# Patient Record
Sex: Female | Born: 1960
Health system: Southern US, Community
[De-identification: ages and names within clinical notes are randomized; demographics above are authoritative.]

## PROBLEM LIST (undated history)

## (undated) DIAGNOSIS — Z9889 Other specified postprocedural states: Secondary | ICD-10-CM

## (undated) DIAGNOSIS — M779 Enthesopathy, unspecified: Secondary | ICD-10-CM

## (undated) DIAGNOSIS — I6523 Occlusion and stenosis of bilateral carotid arteries: Secondary | ICD-10-CM

## (undated) DIAGNOSIS — D649 Anemia, unspecified: Secondary | ICD-10-CM

## (undated) DIAGNOSIS — R634 Abnormal weight loss: Secondary | ICD-10-CM

## (undated) DIAGNOSIS — Z9221 Personal history of antineoplastic chemotherapy: Secondary | ICD-10-CM

## (undated) DIAGNOSIS — K625 Hemorrhage of anus and rectum: Secondary | ICD-10-CM

## (undated) DIAGNOSIS — I700 Atherosclerosis of aorta: Secondary | ICD-10-CM

## (undated) DIAGNOSIS — I429 Cardiomyopathy, unspecified: Secondary | ICD-10-CM

## (undated) DIAGNOSIS — I2510 Atherosclerotic heart disease of native coronary artery without angina pectoris: Secondary | ICD-10-CM

## (undated) DIAGNOSIS — Z923 Personal history of irradiation: Secondary | ICD-10-CM

## (undated) DIAGNOSIS — J189 Pneumonia, unspecified organism: Secondary | ICD-10-CM

## (undated) DIAGNOSIS — N183 Chronic kidney disease, stage 3 unspecified: Secondary | ICD-10-CM

## (undated) DIAGNOSIS — E785 Hyperlipidemia, unspecified: Secondary | ICD-10-CM

## (undated) DIAGNOSIS — E230 Hypopituitarism: Secondary | ICD-10-CM

## (undated) DIAGNOSIS — I10 Essential (primary) hypertension: Secondary | ICD-10-CM

## (undated) DIAGNOSIS — R0609 Other forms of dyspnea: Secondary | ICD-10-CM

## (undated) DIAGNOSIS — N2581 Secondary hyperparathyroidism of renal origin: Secondary | ICD-10-CM

## (undated) DIAGNOSIS — J0430 Supraglottitis, unspecified, without obstruction: Secondary | ICD-10-CM

## (undated) DIAGNOSIS — R251 Tremor, unspecified: Secondary | ICD-10-CM

## (undated) DIAGNOSIS — F319 Bipolar disorder, unspecified: Secondary | ICD-10-CM

## (undated) DIAGNOSIS — G629 Polyneuropathy, unspecified: Secondary | ICD-10-CM

## (undated) DIAGNOSIS — S0096XA Insect bite (nonvenomous) of unspecified part of head, initial encounter: Secondary | ICD-10-CM

## (undated) DIAGNOSIS — G4733 Obstructive sleep apnea (adult) (pediatric): Secondary | ICD-10-CM

## (undated) DIAGNOSIS — E039 Hypothyroidism, unspecified: Secondary | ICD-10-CM

## (undated) DIAGNOSIS — C4370 Malignant melanoma of unspecified lower limb, including hip: Secondary | ICD-10-CM

## (undated) DIAGNOSIS — M810 Age-related osteoporosis without current pathological fracture: Secondary | ICD-10-CM

## (undated) DIAGNOSIS — I509 Heart failure, unspecified: Secondary | ICD-10-CM

## (undated) DIAGNOSIS — R000 Tachycardia, unspecified: Secondary | ICD-10-CM

## (undated) DIAGNOSIS — N3281 Overactive bladder: Secondary | ICD-10-CM

## (undated) DIAGNOSIS — K859 Acute pancreatitis without necrosis or infection, unspecified: Secondary | ICD-10-CM

## (undated) DIAGNOSIS — F419 Anxiety disorder, unspecified: Secondary | ICD-10-CM

## (undated) DIAGNOSIS — K5790 Diverticulosis of intestine, part unspecified, without perforation or abscess without bleeding: Secondary | ICD-10-CM

## (undated) DIAGNOSIS — E2740 Unspecified adrenocortical insufficiency: Secondary | ICD-10-CM

## (undated) DIAGNOSIS — M2550 Pain in unspecified joint: Secondary | ICD-10-CM

## (undated) DIAGNOSIS — N289 Disorder of kidney and ureter, unspecified: Secondary | ICD-10-CM

## (undated) DIAGNOSIS — W57XXXA Bitten or stung by nonvenomous insect and other nonvenomous arthropods, initial encounter: Secondary | ICD-10-CM

## (undated) DIAGNOSIS — M5126 Other intervertebral disc displacement, lumbar region: Secondary | ICD-10-CM

## (undated) DIAGNOSIS — R002 Palpitations: Secondary | ICD-10-CM

## (undated) DIAGNOSIS — Z85820 Personal history of malignant melanoma of skin: Secondary | ICD-10-CM

## (undated) DIAGNOSIS — F32A Depression, unspecified: Secondary | ICD-10-CM

## (undated) DIAGNOSIS — M1990 Unspecified osteoarthritis, unspecified site: Secondary | ICD-10-CM

## (undated) DIAGNOSIS — F329 Major depressive disorder, single episode, unspecified: Secondary | ICD-10-CM

## (undated) DIAGNOSIS — G319 Degenerative disease of nervous system, unspecified: Secondary | ICD-10-CM

## (undated) DIAGNOSIS — K219 Gastro-esophageal reflux disease without esophagitis: Secondary | ICD-10-CM

## (undated) HISTORY — DX: Bitten or stung by nonvenomous insect and other nonvenomous arthropods, initial encounter: W57.XXXA

## (undated) HISTORY — DX: Insect bite (nonvenomous) of unspecified part of head, initial encounter: S00.96XA

## (undated) HISTORY — DX: Gastro-esophageal reflux disease without esophagitis: K21.9

## (undated) HISTORY — DX: Malignant melanoma of unspecified lower limb, including hip: C43.70

## (undated) HISTORY — PX: SPINE SURGERY: SHX786

## (undated) HISTORY — DX: Other intervertebral disc displacement, lumbar region: M51.26

## (undated) HISTORY — PX: FRACTURE SURGERY: SHX138

## (undated) HISTORY — PX: ESOPHAGOGASTRODUODENOSCOPY: SHX1529

## (undated) HISTORY — DX: Personal history of malignant melanoma of skin: Z85.820

---

## 1983-03-01 HISTORY — PX: BREAST CYST EXCISION: SHX579

## 2006-02-28 DIAGNOSIS — Z9221 Personal history of antineoplastic chemotherapy: Secondary | ICD-10-CM

## 2006-02-28 HISTORY — DX: Personal history of antineoplastic chemotherapy: Z92.21

## 2006-02-28 HISTORY — PX: MELANOMA EXCISION: SHX5266

## 2006-12-30 ENCOUNTER — Ambulatory Visit: Payer: Self-pay | Admitting: Oncology

## 2007-01-18 ENCOUNTER — Ambulatory Visit: Payer: Self-pay | Admitting: Oncology

## 2007-01-23 ENCOUNTER — Ambulatory Visit: Payer: Self-pay | Admitting: Oncology

## 2007-01-29 ENCOUNTER — Ambulatory Visit: Payer: Self-pay | Admitting: Oncology

## 2007-02-09 ENCOUNTER — Ambulatory Visit: Payer: Self-pay | Admitting: General Surgery

## 2007-03-01 ENCOUNTER — Ambulatory Visit: Payer: Self-pay | Admitting: Oncology

## 2007-03-16 ENCOUNTER — Ambulatory Visit: Payer: Self-pay | Admitting: Oncology

## 2007-04-01 ENCOUNTER — Ambulatory Visit: Payer: Self-pay | Admitting: Oncology

## 2007-04-29 ENCOUNTER — Ambulatory Visit: Payer: Self-pay | Admitting: Oncology

## 2007-05-30 ENCOUNTER — Ambulatory Visit: Payer: Self-pay | Admitting: Oncology

## 2007-06-29 ENCOUNTER — Ambulatory Visit: Payer: Self-pay | Admitting: Oncology

## 2007-07-30 ENCOUNTER — Ambulatory Visit: Payer: Self-pay | Admitting: Oncology

## 2007-08-29 ENCOUNTER — Ambulatory Visit: Payer: Self-pay | Admitting: Oncology

## 2007-09-19 ENCOUNTER — Ambulatory Visit: Payer: Self-pay | Admitting: Oncology

## 2007-09-29 ENCOUNTER — Ambulatory Visit: Payer: Self-pay | Admitting: Oncology

## 2007-10-30 ENCOUNTER — Ambulatory Visit: Payer: Self-pay | Admitting: Oncology

## 2007-10-31 ENCOUNTER — Ambulatory Visit: Payer: Self-pay | Admitting: Oncology

## 2007-11-29 ENCOUNTER — Ambulatory Visit: Payer: Self-pay | Admitting: Oncology

## 2007-12-30 ENCOUNTER — Ambulatory Visit: Payer: Self-pay | Admitting: Oncology

## 2008-01-29 ENCOUNTER — Ambulatory Visit: Payer: Self-pay | Admitting: Oncology

## 2008-02-29 ENCOUNTER — Ambulatory Visit: Payer: Self-pay | Admitting: Oncology

## 2008-02-29 DIAGNOSIS — M5126 Other intervertebral disc displacement, lumbar region: Secondary | ICD-10-CM

## 2008-02-29 HISTORY — PX: BACK SURGERY: SHX140

## 2008-02-29 HISTORY — DX: Other intervertebral disc displacement, lumbar region: M51.26

## 2008-03-17 ENCOUNTER — Ambulatory Visit: Payer: Self-pay | Admitting: Oncology

## 2008-03-31 ENCOUNTER — Ambulatory Visit: Payer: Self-pay | Admitting: Oncology

## 2008-04-28 ENCOUNTER — Ambulatory Visit: Payer: Self-pay | Admitting: Oncology

## 2008-05-29 ENCOUNTER — Ambulatory Visit: Payer: Self-pay | Admitting: Oncology

## 2008-07-29 ENCOUNTER — Ambulatory Visit: Payer: Self-pay | Admitting: Oncology

## 2008-08-06 ENCOUNTER — Ambulatory Visit: Payer: Self-pay | Admitting: Oncology

## 2008-08-28 ENCOUNTER — Ambulatory Visit: Payer: Self-pay | Admitting: Oncology

## 2008-09-04 ENCOUNTER — Encounter: Payer: Self-pay | Admitting: Oncology

## 2008-09-28 ENCOUNTER — Encounter: Payer: Self-pay | Admitting: Oncology

## 2008-09-28 ENCOUNTER — Ambulatory Visit: Payer: Self-pay | Admitting: Oncology

## 2008-10-09 ENCOUNTER — Ambulatory Visit: Payer: Self-pay | Admitting: Oncology

## 2008-10-29 ENCOUNTER — Ambulatory Visit: Payer: Self-pay | Admitting: Oncology

## 2008-11-25 ENCOUNTER — Ambulatory Visit: Payer: Self-pay | Admitting: Pain Medicine

## 2008-11-28 ENCOUNTER — Ambulatory Visit: Payer: Self-pay | Admitting: Oncology

## 2008-12-15 ENCOUNTER — Ambulatory Visit: Payer: Self-pay | Admitting: Oncology

## 2008-12-18 ENCOUNTER — Ambulatory Visit: Payer: Self-pay | Admitting: Oncology

## 2008-12-19 ENCOUNTER — Ambulatory Visit: Payer: Self-pay | Admitting: Unknown Physician Specialty

## 2008-12-26 ENCOUNTER — Ambulatory Visit: Payer: Self-pay | Admitting: Unknown Physician Specialty

## 2008-12-26 HISTORY — PX: OTHER SURGICAL HISTORY: SHX169

## 2008-12-29 ENCOUNTER — Ambulatory Visit: Payer: Self-pay | Admitting: Oncology

## 2009-03-31 ENCOUNTER — Ambulatory Visit: Payer: Self-pay | Admitting: Oncology

## 2009-04-20 ENCOUNTER — Ambulatory Visit: Payer: Self-pay | Admitting: Oncology

## 2009-04-28 ENCOUNTER — Ambulatory Visit: Payer: Self-pay | Admitting: Oncology

## 2009-05-29 ENCOUNTER — Ambulatory Visit: Payer: Self-pay | Admitting: Oncology

## 2009-07-29 ENCOUNTER — Ambulatory Visit: Payer: Self-pay | Admitting: Oncology

## 2009-08-19 ENCOUNTER — Ambulatory Visit: Payer: Self-pay | Admitting: Oncology

## 2009-08-28 ENCOUNTER — Ambulatory Visit: Payer: Self-pay | Admitting: Oncology

## 2009-09-22 ENCOUNTER — Ambulatory Visit: Payer: Self-pay | Admitting: Oncology

## 2009-10-12 DIAGNOSIS — R350 Frequency of micturition: Secondary | ICD-10-CM | POA: Insufficient documentation

## 2009-10-12 DIAGNOSIS — Z720 Tobacco use: Secondary | ICD-10-CM | POA: Insufficient documentation

## 2009-10-12 DIAGNOSIS — Z85820 Personal history of malignant melanoma of skin: Secondary | ICD-10-CM | POA: Insufficient documentation

## 2009-12-24 ENCOUNTER — Ambulatory Visit: Payer: Self-pay | Admitting: Oncology

## 2009-12-29 ENCOUNTER — Ambulatory Visit: Payer: Self-pay | Admitting: Oncology

## 2010-02-28 DIAGNOSIS — Z85820 Personal history of malignant melanoma of skin: Secondary | ICD-10-CM

## 2010-02-28 DIAGNOSIS — K219 Gastro-esophageal reflux disease without esophagitis: Secondary | ICD-10-CM

## 2010-02-28 HISTORY — DX: Gastro-esophageal reflux disease without esophagitis: K21.9

## 2010-02-28 HISTORY — DX: Personal history of malignant melanoma of skin: Z85.820

## 2010-06-24 ENCOUNTER — Ambulatory Visit: Payer: Self-pay | Admitting: Oncology

## 2010-06-29 ENCOUNTER — Ambulatory Visit: Payer: Self-pay | Admitting: Oncology

## 2010-09-15 ENCOUNTER — Ambulatory Visit: Payer: Self-pay | Admitting: Nurse Practitioner

## 2010-09-17 ENCOUNTER — Ambulatory Visit: Payer: Self-pay | Admitting: Oncology

## 2010-09-29 ENCOUNTER — Ambulatory Visit: Payer: Self-pay | Admitting: Oncology

## 2010-10-15 ENCOUNTER — Ambulatory Visit: Payer: Self-pay | Admitting: General Surgery

## 2010-10-19 ENCOUNTER — Ambulatory Visit: Payer: Self-pay | Admitting: General Surgery

## 2010-10-30 ENCOUNTER — Ambulatory Visit: Payer: Self-pay | Admitting: Oncology

## 2010-11-16 LAB — PATHOLOGY REPORT

## 2010-11-29 ENCOUNTER — Ambulatory Visit: Payer: Self-pay | Admitting: Oncology

## 2010-12-19 HISTORY — PX: SKIN LESION EXCISION: SHX2412

## 2010-12-30 ENCOUNTER — Ambulatory Visit: Payer: Self-pay | Admitting: Oncology

## 2011-01-29 ENCOUNTER — Ambulatory Visit: Payer: Self-pay | Admitting: Oncology

## 2011-03-01 ENCOUNTER — Ambulatory Visit: Payer: Self-pay | Admitting: Oncology

## 2011-03-02 LAB — COMPREHENSIVE METABOLIC PANEL
Albumin: 3.4 g/dL (ref 3.4–5.0)
Alkaline Phosphatase: 75 U/L (ref 50–136)
Anion Gap: 8 (ref 7–16)
BUN: 11 mg/dL (ref 7–18)
Bilirubin,Total: 0.1 mg/dL — ABNORMAL LOW (ref 0.2–1.0)
Co2: 27 mmol/L (ref 21–32)
Creatinine: 1.34 mg/dL — ABNORMAL HIGH (ref 0.60–1.30)
EGFR (Non-African Amer.): 45 — ABNORMAL LOW
Glucose: 110 mg/dL — ABNORMAL HIGH (ref 65–99)
Osmolality: 281 (ref 275–301)
Potassium: 3.4 mmol/L — ABNORMAL LOW (ref 3.5–5.1)
SGPT (ALT): 23 U/L
Total Protein: 6.7 g/dL (ref 6.4–8.2)

## 2011-03-02 LAB — CBC CANCER CENTER
Basophil #: 0 x10 3/mm (ref 0.0–0.1)
Basophil %: 0.3 %
Eosinophil %: 5 %
HCT: 35.1 % (ref 35.0–47.0)
Lymphocyte %: 22.1 %
MCH: 32.2 pg (ref 26.0–34.0)
MCHC: 33.5 g/dL (ref 32.0–36.0)
MCV: 96 fL (ref 80–100)
Monocyte %: 1.6 %
RBC: 3.65 10*6/uL — ABNORMAL LOW (ref 3.80–5.20)
RDW: 14.2 % (ref 11.5–14.5)

## 2011-03-02 LAB — LIPASE, BLOOD: Lipase: 355 U/L (ref 73–393)

## 2011-03-02 LAB — AMYLASE: Amylase: 44 U/L (ref 25–115)

## 2011-03-10 DIAGNOSIS — E039 Hypothyroidism, unspecified: Secondary | ICD-10-CM | POA: Insufficient documentation

## 2011-03-10 DIAGNOSIS — E2740 Unspecified adrenocortical insufficiency: Secondary | ICD-10-CM | POA: Insufficient documentation

## 2011-03-10 LAB — HCG, QUANTITATIVE, PREGNANCY: Beta Hcg, Quant.: <1 m[IU]/mL — ABNORMAL LOW

## 2011-03-11 ENCOUNTER — Ambulatory Visit: Payer: Self-pay | Admitting: Oncology

## 2011-03-14 LAB — CBC CANCER CENTER
Basophil #: 0 x10 3/mm (ref 0.0–0.1)
Eosinophil #: 0.2 x10 3/mm (ref 0.0–0.7)
HGB: 13.9 g/dL (ref 12.0–16.0)
Lymphocyte #: 2.1 x10 3/mm (ref 1.0–3.6)
MCH: 33.1 pg (ref 26.0–34.0)
MCHC: 34.1 g/dL (ref 32.0–36.0)
MCV: 97 fL (ref 80–100)
Monocyte #: 0.3 x10 3/mm (ref 0.0–0.7)
Neutrophil %: 72.9 %
Platelet: 326 x10 3/mm (ref 150–440)
RDW: 14.8 % — ABNORMAL HIGH (ref 11.5–14.5)

## 2011-03-14 LAB — COMPREHENSIVE METABOLIC PANEL
Albumin: 4.3 g/dL (ref 3.4–5.0)
Alkaline Phosphatase: 80 U/L (ref 50–136)
Bilirubin,Total: 0.3 mg/dL (ref 0.2–1.0)
Calcium, Total: 9.3 mg/dL (ref 8.5–10.1)
Co2: 31 mmol/L (ref 21–32)
Creatinine: 1.38 mg/dL — ABNORMAL HIGH (ref 0.60–1.30)
EGFR (Non-African Amer.): 43 — ABNORMAL LOW
Glucose: 118 mg/dL — ABNORMAL HIGH (ref 65–99)
SGOT(AST): 26 U/L (ref 15–37)
Sodium: 139 mmol/L (ref 136–145)

## 2011-04-01 ENCOUNTER — Ambulatory Visit: Payer: Self-pay | Admitting: Oncology

## 2011-06-13 ENCOUNTER — Ambulatory Visit: Payer: Self-pay | Admitting: Oncology

## 2011-06-13 LAB — CBC CANCER CENTER
Eosinophil #: 0.1 x10 3/mm (ref 0.0–0.7)
HCT: 38.4 % (ref 35.0–47.0)
Lymphocyte #: 1.9 x10 3/mm (ref 1.0–3.6)
Lymphocyte %: 16.9 %
MCH: 31.8 pg (ref 26.0–34.0)
MCHC: 34.1 g/dL (ref 32.0–36.0)
Monocyte #: 0.4 x10 3/mm (ref 0.2–0.9)
Neutrophil %: 78.2 %
Platelet: 218 x10 3/mm (ref 150–440)
RBC: 4.12 10*6/uL (ref 3.80–5.20)
RDW: 11.9 % (ref 11.5–14.5)

## 2011-06-13 LAB — COMPREHENSIVE METABOLIC PANEL
Anion Gap: 9 (ref 7–16)
BUN: 9 mg/dL (ref 7–18)
Calcium, Total: 8.8 mg/dL (ref 8.5–10.1)
Chloride: 100 mmol/L (ref 98–107)
Glucose: 87 mg/dL (ref 65–99)
SGPT (ALT): 27 U/L
Sodium: 135 mmol/L — ABNORMAL LOW (ref 136–145)
Total Protein: 6.7 g/dL (ref 6.4–8.2)

## 2011-06-29 ENCOUNTER — Ambulatory Visit: Payer: Self-pay | Admitting: Oncology

## 2011-07-30 ENCOUNTER — Ambulatory Visit: Payer: Self-pay | Admitting: Oncology

## 2011-10-13 ENCOUNTER — Ambulatory Visit: Payer: Self-pay | Admitting: Oncology

## 2011-10-17 ENCOUNTER — Ambulatory Visit: Payer: Self-pay | Admitting: Oncology

## 2011-10-17 LAB — COMPREHENSIVE METABOLIC PANEL
Alkaline Phosphatase: 43 U/L — ABNORMAL LOW (ref 50–136)
Anion Gap: 4 — ABNORMAL LOW (ref 7–16)
Calcium, Total: 9.3 mg/dL (ref 8.5–10.1)
Co2: 32 mmol/L (ref 21–32)
EGFR (Non-African Amer.): 46 — ABNORMAL LOW
Osmolality: 281 (ref 275–301)
SGOT(AST): 29 U/L (ref 15–37)
Sodium: 141 mmol/L (ref 136–145)

## 2011-10-17 LAB — CBC CANCER CENTER
Eosinophil #: 0.2 x10 3/mm (ref 0.0–0.7)
Lymphocyte #: 3.2 x10 3/mm (ref 1.0–3.6)
MCH: 30.8 pg (ref 26.0–34.0)
MCV: 93 fL (ref 80–100)
Monocyte %: 5.1 %
Neutrophil %: 38.7 %
Platelet: 202 x10 3/mm (ref 150–440)
RDW: 12.3 % (ref 11.5–14.5)
WBC: 6.2 x10 3/mm (ref 3.6–11.0)

## 2011-10-17 LAB — FOLATE: Folic Acid: 18.9 ng/mL (ref 3.1–100.0)

## 2011-10-24 LAB — HCG, QUANTITATIVE, PREGNANCY: Beta Hcg, Quant.: <1 m[IU]/mL — ABNORMAL LOW

## 2011-10-24 LAB — CREATININE, SERUM
Creatinine: 1.37 mg/dL — ABNORMAL HIGH (ref 0.60–1.30)
EGFR (African American): 52 — ABNORMAL LOW

## 2011-10-30 ENCOUNTER — Ambulatory Visit: Payer: Self-pay | Admitting: Oncology

## 2011-11-14 LAB — COMPREHENSIVE METABOLIC PANEL
Alkaline Phosphatase: 47 U/L — ABNORMAL LOW (ref 50–136)
BUN: 13 mg/dL (ref 7–18)
Bilirubin,Total: 0.3 mg/dL (ref 0.2–1.0)
Calcium, Total: 9.5 mg/dL (ref 8.5–10.1)
Chloride: 101 mmol/L (ref 98–107)
Co2: 31 mmol/L (ref 21–32)
Creatinine: 1.4 mg/dL — ABNORMAL HIGH (ref 0.60–1.30)
EGFR (African American): 51 — ABNORMAL LOW
Osmolality: 276 (ref 275–301)
Potassium: 3.8 mmol/L (ref 3.5–5.1)
SGPT (ALT): 46 U/L (ref 12–78)
Sodium: 138 mmol/L (ref 136–145)
Total Protein: 7.1 g/dL (ref 6.4–8.2)

## 2011-11-14 LAB — CBC CANCER CENTER
Basophil #: 0.1 x10 3/mm (ref 0.0–0.1)
Eosinophil #: 0.3 x10 3/mm (ref 0.0–0.7)
Lymphocyte %: 45.5 %
MCH: 30.5 pg (ref 26.0–34.0)
MCHC: 33.1 g/dL (ref 32.0–36.0)
Monocyte #: 0.4 x10 3/mm (ref 0.2–0.9)
Neutrophil %: 43 %
Platelet: 229 x10 3/mm (ref 150–440)
RBC: 3.94 10*6/uL (ref 3.80–5.20)

## 2011-11-29 ENCOUNTER — Ambulatory Visit: Payer: Self-pay | Admitting: Oncology

## 2012-02-13 ENCOUNTER — Ambulatory Visit: Payer: Self-pay | Admitting: Oncology

## 2012-02-13 LAB — CBC CANCER CENTER
Basophil #: 0.1 x10 3/mm (ref 0.0–0.1)
Basophil %: 1 %
Eosinophil #: 0.2 x10 3/mm (ref 0.0–0.7)
HCT: 35.4 % (ref 35.0–47.0)
Lymphocyte #: 3.2 x10 3/mm (ref 1.0–3.6)
MCH: 32.3 pg (ref 26.0–34.0)
MCHC: 35.1 g/dL (ref 32.0–36.0)
MCV: 92 fL (ref 80–100)
Monocyte %: 5.9 %
Neutrophil #: 3.2 x10 3/mm (ref 1.4–6.5)
Platelet: 198 x10 3/mm (ref 150–440)
RDW: 12.3 % (ref 11.5–14.5)

## 2012-02-13 LAB — COMPREHENSIVE METABOLIC PANEL
BUN: 18 mg/dL (ref 7–18)
Calcium, Total: 8.8 mg/dL (ref 8.5–10.1)
Chloride: 101 mmol/L (ref 98–107)
EGFR (African American): 52 — ABNORMAL LOW
EGFR (Non-African Amer.): 45 — ABNORMAL LOW
Osmolality: 274 (ref 275–301)
Potassium: 3.7 mmol/L (ref 3.5–5.1)
SGOT(AST): 45 U/L — ABNORMAL HIGH (ref 15–37)
SGPT (ALT): 54 U/L (ref 12–78)
Total Protein: 6.8 g/dL (ref 6.4–8.2)

## 2012-02-29 ENCOUNTER — Ambulatory Visit: Payer: Self-pay | Admitting: Oncology

## 2012-04-28 ENCOUNTER — Ambulatory Visit: Payer: Self-pay | Admitting: Oncology

## 2012-05-14 LAB — COMPREHENSIVE METABOLIC PANEL
Albumin: 3.9 g/dL (ref 3.4–5.0)
Calcium, Total: 8.7 mg/dL (ref 8.5–10.1)
Co2: 26 mmol/L (ref 21–32)
EGFR (Non-African Amer.): 50 — ABNORMAL LOW
Potassium: 3.6 mmol/L (ref 3.5–5.1)
SGOT(AST): 21 U/L (ref 15–37)
SGPT (ALT): 27 U/L (ref 12–78)
Sodium: 135 mmol/L — ABNORMAL LOW (ref 136–145)
Total Protein: 7.1 g/dL (ref 6.4–8.2)

## 2012-05-14 LAB — CBC CANCER CENTER
Basophil %: 0.8 %
Eosinophil #: 0.3 x10 3/mm (ref 0.0–0.7)
HCT: 36.6 % (ref 35.0–47.0)
Lymphocyte #: 4 x10 3/mm — ABNORMAL HIGH (ref 1.0–3.6)
Lymphocyte %: 39.2 %
MCH: 31.9 pg (ref 26.0–34.0)
Monocyte %: 4.1 %
Neutrophil #: 5.3 x10 3/mm (ref 1.4–6.5)

## 2012-05-22 DIAGNOSIS — F6089 Other specific personality disorders: Secondary | ICD-10-CM | POA: Insufficient documentation

## 2012-05-28 ENCOUNTER — Encounter: Payer: Self-pay | Admitting: *Deleted

## 2012-05-28 DIAGNOSIS — C4370 Malignant melanoma of unspecified lower limb, including hip: Secondary | ICD-10-CM | POA: Insufficient documentation

## 2012-05-29 ENCOUNTER — Ambulatory Visit: Payer: Self-pay | Admitting: Oncology

## 2012-05-31 ENCOUNTER — Ambulatory Visit (INDEPENDENT_AMBULATORY_CARE_PROVIDER_SITE_OTHER): Payer: BC Managed Care – PPO | Admitting: General Surgery

## 2012-05-31 ENCOUNTER — Encounter: Payer: Self-pay | Admitting: General Surgery

## 2012-05-31 VITALS — BP 110/80 | HR 78 | Resp 12 | Ht 68.0 in | Wt 183.0 lb

## 2012-05-31 DIAGNOSIS — L989 Disorder of the skin and subcutaneous tissue, unspecified: Secondary | ICD-10-CM

## 2012-05-31 DIAGNOSIS — C799 Secondary malignant neoplasm of unspecified site: Secondary | ICD-10-CM

## 2012-05-31 DIAGNOSIS — C801 Malignant (primary) neoplasm, unspecified: Secondary | ICD-10-CM

## 2012-05-31 DIAGNOSIS — C439 Malignant melanoma of skin, unspecified: Secondary | ICD-10-CM

## 2012-05-31 DIAGNOSIS — R229 Localized swelling, mass and lump, unspecified: Secondary | ICD-10-CM

## 2012-05-31 NOTE — Patient Instructions (Addendum)
Dressing can be removed in about 3 days.  Keep area clean . Ice pack today for comfort and keep area elevated as much as possible today.

## 2012-05-31 NOTE — Progress Notes (Signed)
Patient ID: Patricia Li, female   DOB: 11-01-1960, 52 y.o.   MRN: [id number]  Chief Complaint  Patient presents with  . skin lesion    HPI Patricia Li is a 52 y.o. female.  Patient here today for evaluation left lower leg referred by Dr Oliva Bustard.  Previous biopsy was benign, but states it is getting larger and thicker.  Last chemotherapy treatment was 2013.  The patient underwent excision of a metastatic melanoma from the right anterior medial thigh in December 2008. A primary was never identified. She underwent a year of interferon therapy.  In August of 2012 a right posterior thigh mass was excised showing evidence of malignant melanoma, metastatic without a primary identified.  The patient reports the left anterior lateral calf lesion has been present for some time but gradually increasing in size. HPI  Past Medical History  Diagnosis Date  . Malignant melanoma of skin of lower limb, including hip 2008    right thigh  . Personal history of tobacco use, presenting hazards to health 2012  . Lumbar herniated disc 2010  . Personal history of malignant melanoma of skin 2012  . Breast screening, unspecified 2012  . GERD (gastroesophageal reflux disease) 2012    Past Surgical History  Procedure Laterality Date  . Melanoma excision  2008    right thigh  . Breast cyst excision  2008  . Back surgery  2010  . Skin lesion excision Right 12-19-10    posterior    No family history on file.  Social History History  Substance Use Topics  . Smoking status: Current Every Day Smoker -- 1.00 packs/day for 20 years    Types: Cigarettes  . Smokeless tobacco: Not on file     Comment: electric  . Alcohol Use: Yes     Comment: ocassionally    No Known Allergies  Current Outpatient Prescriptions  Medication Sig Dispense Refill  . Asenapine Maleate (SAPHRIS) 10 MG SUBL Place 20 mg under the tongue daily.      . benztropine (COGENTIN) 0.5 MG tablet Take by mouth 2 (two) times daily.       . diazepam (VALIUM) 5 MG tablet       . furosemide (LASIX) 20 MG tablet Take 20 mg by mouth daily.      . hydrocortisone (CORTEF) 10 MG tablet Take 10 mg by mouth 2 (two) times daily.      Marland Kitchen levothyroxine (SYNTHROID, LEVOTHROID) 112 MCG tablet       . pantoprazole (PROTONIX) 40 MG tablet Take 40 mg by mouth daily.      . trazodone (DESYREL) 300 MG tablet Take 300 mg by mouth at bedtime.      . TRIHEXYPHENIDYL HCL PO Take by mouth.      . venlafaxine XR (EFFEXOR-XR) 150 MG 24 hr capsule        No current facility-administered medications for this visit.    Review of Systems Review of Systems  Blood pressure 110/80, pulse 78, resp. rate 12, height 5\' 8"  (1.727 m), weight 183 lb (83.008 kg).  Physical Exam Physical Exam  Constitutional: She is oriented to person, place, and time. She appears well-developed and well-nourished.  Cardiovascular: Normal rate, regular rhythm and intact distal pulses.   Pulmonary/Chest: Effort normal and breath sounds normal.  Lymphadenopathy:       Right: No inguinal adenopathy present.       Left: No inguinal adenopathy present.  Neurological: She is alert and oriented to person, place, and  time.  Skin: Skin is warm and dry.  1 cm nodule left anterior lateral calf  Data Reviewed None available  Assessment    Left anterior lateral calf nodule, personal history of metastatic melanoma of unknown primary.    Plan    Excision was recommended. Pros and cons including risks of pain and bleeding were discussed.       Robert Bellow 06/01/2012, 3:29 PM

## 2012-06-01 DIAGNOSIS — C799 Secondary malignant neoplasm of unspecified site: Secondary | ICD-10-CM | POA: Insufficient documentation

## 2012-06-01 DIAGNOSIS — C439 Malignant melanoma of skin, unspecified: Secondary | ICD-10-CM | POA: Insufficient documentation

## 2012-06-04 LAB — PATHOLOGY

## 2012-06-07 ENCOUNTER — Ambulatory Visit (INDEPENDENT_AMBULATORY_CARE_PROVIDER_SITE_OTHER): Payer: BC Managed Care – PPO | Admitting: *Deleted

## 2012-06-07 DIAGNOSIS — L989 Disorder of the skin and subcutaneous tissue, unspecified: Secondary | ICD-10-CM

## 2012-06-07 NOTE — Patient Instructions (Addendum)
Keep area clean. Aware pathology benign.

## 2012-07-29 ENCOUNTER — Ambulatory Visit: Payer: Self-pay | Admitting: Oncology

## 2012-07-29 HISTORY — PX: COLONOSCOPY: SHX174

## 2012-08-01 ENCOUNTER — Ambulatory Visit: Payer: Self-pay | Admitting: Unknown Physician Specialty

## 2012-08-03 LAB — PATHOLOGY REPORT

## 2012-08-06 LAB — CBC CANCER CENTER
Basophil %: 1 %
Eosinophil #: 0.1 x10 3/mm (ref 0.0–0.7)
Eosinophil %: 0.6 %
HGB: 14.6 g/dL (ref 12.0–16.0)
Lymphocyte #: 1.9 x10 3/mm (ref 1.0–3.6)
MCHC: 35.1 g/dL (ref 32.0–36.0)
MCV: 91 fL (ref 80–100)
Monocyte #: 0.2 x10 3/mm (ref 0.2–0.9)
Monocyte %: 2.3 %
Neutrophil #: 7.2 x10 3/mm — ABNORMAL HIGH (ref 1.4–6.5)
Neutrophil %: 76.1 %

## 2012-08-06 LAB — COMPREHENSIVE METABOLIC PANEL
Alkaline Phosphatase: 68 U/L (ref 50–136)
Anion Gap: 7 (ref 7–16)
Co2: 28 mmol/L (ref 21–32)
EGFR (Non-African Amer.): 46 — ABNORMAL LOW
Osmolality: 274 (ref 275–301)
Potassium: 3.8 mmol/L (ref 3.5–5.1)
SGOT(AST): 29 U/L (ref 15–37)
Total Protein: 7.6 g/dL (ref 6.4–8.2)

## 2012-08-28 ENCOUNTER — Ambulatory Visit: Payer: Self-pay | Admitting: Oncology

## 2012-09-07 DIAGNOSIS — K219 Gastro-esophageal reflux disease without esophagitis: Secondary | ICD-10-CM | POA: Insufficient documentation

## 2012-09-13 ENCOUNTER — Ambulatory Visit: Payer: Self-pay | Admitting: Oncology

## 2012-12-05 ENCOUNTER — Ambulatory Visit: Payer: Self-pay | Admitting: Oncology

## 2012-12-18 ENCOUNTER — Ambulatory Visit: Payer: Self-pay | Admitting: Oncology

## 2012-12-29 ENCOUNTER — Ambulatory Visit: Payer: Self-pay | Admitting: Oncology

## 2013-03-18 ENCOUNTER — Ambulatory Visit: Payer: Self-pay | Admitting: Family Medicine

## 2013-03-18 LAB — CBC CANCER CENTER
BASOS PCT: 0.8 %
Basophil #: 0.1 x10 3/mm (ref 0.0–0.1)
EOS PCT: 3.1 %
Eosinophil #: 0.2 x10 3/mm (ref 0.0–0.7)
HCT: 41.4 % (ref 35.0–47.0)
HGB: 14 g/dL (ref 12.0–16.0)
LYMPHS PCT: 47 %
Lymphocyte #: 3.4 x10 3/mm (ref 1.0–3.6)
MCH: 30.9 pg (ref 26.0–34.0)
MCHC: 33.8 g/dL (ref 32.0–36.0)
MCV: 91 fL (ref 80–100)
MONOS PCT: 4.3 %
Monocyte #: 0.3 x10 3/mm (ref 0.2–0.9)
NEUTROS ABS: 3.2 x10 3/mm (ref 1.4–6.5)
Neutrophil %: 44.8 %
Platelet: 237 x10 3/mm (ref 150–440)
RBC: 4.53 10*6/uL (ref 3.80–5.20)
RDW: 12.4 % (ref 11.5–14.5)
WBC: 7.2 x10 3/mm (ref 3.6–11.0)

## 2013-03-18 LAB — COMPREHENSIVE METABOLIC PANEL
ALBUMIN: 3.9 g/dL (ref 3.4–5.0)
ALK PHOS: 55 U/L
ANION GAP: 7 (ref 7–16)
BUN: 17 mg/dL (ref 7–18)
Bilirubin,Total: 0.4 mg/dL (ref 0.2–1.0)
CALCIUM: 8.6 mg/dL (ref 8.5–10.1)
CHLORIDE: 101 mmol/L (ref 98–107)
Co2: 29 mmol/L (ref 21–32)
Creatinine: 1.24 mg/dL (ref 0.60–1.30)
EGFR (Non-African Amer.): 50 — ABNORMAL LOW
GFR CALC AF AMER: 58 — AB
GLUCOSE: 120 mg/dL — AB (ref 65–99)
OSMOLALITY: 277 (ref 275–301)
POTASSIUM: 3.5 mmol/L (ref 3.5–5.1)
SGOT(AST): 24 U/L (ref 15–37)
SGPT (ALT): 23 U/L (ref 12–78)
Sodium: 137 mmol/L (ref 136–145)
Total Protein: 6.8 g/dL (ref 6.4–8.2)

## 2013-03-31 ENCOUNTER — Ambulatory Visit: Payer: Self-pay | Admitting: Family Medicine

## 2013-05-22 ENCOUNTER — Ambulatory Visit: Payer: Self-pay | Admitting: Oncology

## 2013-05-22 LAB — CBC CANCER CENTER
BASOS ABS: 0.1 x10 3/mm (ref 0.0–0.1)
Basophil %: 0.9 %
EOS ABS: 0.3 x10 3/mm (ref 0.0–0.7)
Eosinophil %: 3.1 %
HCT: 35.8 % (ref 35.0–47.0)
HGB: 11.9 g/dL — ABNORMAL LOW (ref 12.0–16.0)
Lymphocyte #: 4.2 x10 3/mm — ABNORMAL HIGH (ref 1.0–3.6)
Lymphocyte %: 45.7 %
MCH: 30.1 pg (ref 26.0–34.0)
MCHC: 33.1 g/dL (ref 32.0–36.0)
MCV: 91 fL (ref 80–100)
MONOS PCT: 4.7 %
Monocyte #: 0.4 x10 3/mm (ref 0.2–0.9)
NEUTROS ABS: 4.2 x10 3/mm (ref 1.4–6.5)
Neutrophil %: 45.6 %
Platelet: 251 x10 3/mm (ref 150–440)
RBC: 3.94 10*6/uL (ref 3.80–5.20)
RDW: 12.2 % (ref 11.5–14.5)
WBC: 9.3 x10 3/mm (ref 3.6–11.0)

## 2013-05-22 LAB — COMPREHENSIVE METABOLIC PANEL
ANION GAP: 8 (ref 7–16)
Albumin: 3.6 g/dL (ref 3.4–5.0)
Alkaline Phosphatase: 57 U/L
BUN: 14 mg/dL (ref 7–18)
Bilirubin,Total: 0.2 mg/dL (ref 0.2–1.0)
Calcium, Total: 8.5 mg/dL (ref 8.5–10.1)
Chloride: 103 mmol/L (ref 98–107)
Co2: 29 mmol/L (ref 21–32)
Creatinine: 1.16 mg/dL (ref 0.60–1.30)
EGFR (African American): >60
GFR CALC NON AF AMER: 54 — AB
Glucose: 85 mg/dL (ref 65–99)
Osmolality: 279 (ref 275–301)
POTASSIUM: 3.6 mmol/L (ref 3.5–5.1)
SGOT(AST): 21 U/L (ref 15–37)
SGPT (ALT): 15 U/L (ref 12–78)
SODIUM: 140 mmol/L (ref 136–145)
Total Protein: 6.7 g/dL (ref 6.4–8.2)

## 2013-05-29 ENCOUNTER — Ambulatory Visit: Payer: Self-pay | Admitting: Oncology

## 2013-07-03 ENCOUNTER — Ambulatory Visit: Payer: Self-pay | Admitting: Oncology

## 2013-07-03 LAB — CBC CANCER CENTER
BASOS PCT: 1 %
Basophil #: 0.1 x10 3/mm (ref 0.0–0.1)
EOS PCT: 4.6 %
Eosinophil #: 0.3 x10 3/mm (ref 0.0–0.7)
HCT: 38.2 % (ref 35.0–47.0)
HGB: 12.8 g/dL (ref 12.0–16.0)
LYMPHS ABS: 3.1 x10 3/mm (ref 1.0–3.6)
LYMPHS PCT: 56.4 %
MCH: 30.6 pg (ref 26.0–34.0)
MCHC: 33.5 g/dL (ref 32.0–36.0)
MCV: 92 fL (ref 80–100)
MONO ABS: 0.3 x10 3/mm (ref 0.2–0.9)
Monocyte %: 5 %
Neutrophil #: 1.8 x10 3/mm (ref 1.4–6.5)
Neutrophil %: 33 %
Platelet: 214 x10 3/mm (ref 150–440)
RBC: 4.18 10*6/uL (ref 3.80–5.20)
RDW: 13.9 % (ref 11.5–14.5)
WBC: 5.5 x10 3/mm (ref 3.6–11.0)

## 2013-07-03 LAB — COMPREHENSIVE METABOLIC PANEL
ALT: 31 U/L (ref 12–78)
Albumin: 3.8 g/dL (ref 3.4–5.0)
Alkaline Phosphatase: 73 U/L
Anion Gap: 8 (ref 7–16)
BUN: 11 mg/dL (ref 7–18)
Bilirubin,Total: 0.3 mg/dL (ref 0.2–1.0)
CALCIUM: 8.8 mg/dL (ref 8.5–10.1)
CHLORIDE: 104 mmol/L (ref 98–107)
CO2: 27 mmol/L (ref 21–32)
CREATININE: 0.89 mg/dL (ref 0.60–1.30)
EGFR (Non-African Amer.): >60
Glucose: 102 mg/dL — ABNORMAL HIGH (ref 65–99)
Osmolality: 277 (ref 275–301)
POTASSIUM: 3.6 mmol/L (ref 3.5–5.1)
SGOT(AST): 32 U/L (ref 15–37)
Sodium: 139 mmol/L (ref 136–145)
TOTAL PROTEIN: 7 g/dL (ref 6.4–8.2)

## 2013-07-03 LAB — TSH: Thyroid Stimulating Horm: 1.36 u[IU]/mL

## 2013-07-08 ENCOUNTER — Ambulatory Visit: Payer: Self-pay | Admitting: Oncology

## 2013-07-08 DIAGNOSIS — F419 Anxiety disorder, unspecified: Secondary | ICD-10-CM | POA: Insufficient documentation

## 2013-07-29 ENCOUNTER — Ambulatory Visit: Payer: Self-pay | Admitting: Oncology

## 2013-10-10 ENCOUNTER — Ambulatory Visit: Payer: Self-pay | Admitting: Oncology

## 2013-10-10 LAB — CBC CANCER CENTER
Basophil #: 0.1 x10 3/mm (ref 0.0–0.1)
Basophil %: 0.6 %
EOS ABS: 0.3 x10 3/mm (ref 0.0–0.7)
Eosinophil %: 1.7 %
HCT: 25.7 % — ABNORMAL LOW (ref 35.0–47.0)
HGB: 8.3 g/dL — ABNORMAL LOW (ref 12.0–16.0)
LYMPHS ABS: 3 x10 3/mm (ref 1.0–3.6)
LYMPHS PCT: 18.4 %
MCH: 32.1 pg (ref 26.0–34.0)
MCHC: 32.4 g/dL (ref 32.0–36.0)
MCV: 99 fL (ref 80–100)
MONO ABS: 0.7 x10 3/mm (ref 0.2–0.9)
Monocyte %: 4.5 %
Neutrophil #: 12.2 x10 3/mm — ABNORMAL HIGH (ref 1.4–6.5)
Neutrophil %: 74.8 %
Platelet: 558 x10 3/mm — ABNORMAL HIGH (ref 150–440)
RBC: 2.6 10*6/uL — ABNORMAL LOW (ref 3.80–5.20)
RDW: 14.5 % (ref 11.5–14.5)
WBC: 16.3 x10 3/mm — ABNORMAL HIGH (ref 3.6–11.0)

## 2013-10-10 LAB — COMPREHENSIVE METABOLIC PANEL
ALBUMIN: 3.6 g/dL (ref 3.4–5.0)
ALT: 21 U/L
AST: 26 U/L (ref 15–37)
Alkaline Phosphatase: 82 U/L
Anion Gap: 7 (ref 7–16)
BUN: 15 mg/dL (ref 7–18)
Bilirubin,Total: 0.8 mg/dL (ref 0.2–1.0)
CHLORIDE: 102 mmol/L (ref 98–107)
CO2: 31 mmol/L (ref 21–32)
Calcium, Total: 8.6 mg/dL (ref 8.5–10.1)
Creatinine: 0.94 mg/dL (ref 0.60–1.30)
EGFR (African American): >60
EGFR (Non-African Amer.): >60
Glucose: 84 mg/dL (ref 65–99)
OSMOLALITY: 279 (ref 275–301)
Potassium: 3.4 mmol/L — ABNORMAL LOW (ref 3.5–5.1)
SODIUM: 140 mmol/L (ref 136–145)
Total Protein: 6.9 g/dL (ref 6.4–8.2)

## 2013-10-29 ENCOUNTER — Ambulatory Visit: Payer: Self-pay | Admitting: Oncology

## 2013-12-19 ENCOUNTER — Ambulatory Visit: Payer: Self-pay | Admitting: Oncology

## 2013-12-30 ENCOUNTER — Encounter: Payer: Self-pay | Admitting: General Surgery

## 2014-01-20 DIAGNOSIS — E2749 Other adrenocortical insufficiency: Secondary | ICD-10-CM | POA: Insufficient documentation

## 2014-01-20 DIAGNOSIS — E038 Other specified hypothyroidism: Secondary | ICD-10-CM | POA: Insufficient documentation

## 2014-01-20 DIAGNOSIS — E230 Hypopituitarism: Secondary | ICD-10-CM | POA: Insufficient documentation

## 2014-02-27 DIAGNOSIS — J0430 Supraglottitis, unspecified, without obstruction: Secondary | ICD-10-CM | POA: Insufficient documentation

## 2014-02-28 DIAGNOSIS — I509 Heart failure, unspecified: Secondary | ICD-10-CM

## 2014-02-28 HISTORY — DX: Heart failure, unspecified: I50.9

## 2014-03-03 DIAGNOSIS — T148XXA Other injury of unspecified body region, initial encounter: Secondary | ICD-10-CM | POA: Insufficient documentation

## 2014-04-09 ENCOUNTER — Ambulatory Visit: Payer: Self-pay | Admitting: Oncology

## 2014-04-09 DIAGNOSIS — R51 Headache: Secondary | ICD-10-CM | POA: Diagnosis not present

## 2014-04-09 DIAGNOSIS — Z85820 Personal history of malignant melanoma of skin: Secondary | ICD-10-CM | POA: Diagnosis not present

## 2014-04-10 DIAGNOSIS — R002 Palpitations: Secondary | ICD-10-CM | POA: Insufficient documentation

## 2014-04-10 DIAGNOSIS — Z8719 Personal history of other diseases of the digestive system: Secondary | ICD-10-CM | POA: Insufficient documentation

## 2014-04-10 DIAGNOSIS — I10 Essential (primary) hypertension: Secondary | ICD-10-CM | POA: Insufficient documentation

## 2014-04-10 DIAGNOSIS — R51 Headache: Secondary | ICD-10-CM

## 2014-04-10 DIAGNOSIS — R519 Headache, unspecified: Secondary | ICD-10-CM | POA: Insufficient documentation

## 2014-04-21 DIAGNOSIS — I5022 Chronic systolic (congestive) heart failure: Secondary | ICD-10-CM | POA: Insufficient documentation

## 2014-04-29 ENCOUNTER — Ambulatory Visit
Admit: 2014-04-29 | Disposition: A | Discharge status: Home / Self Care | Payer: Self-pay | Attending: Oncology | Admitting: Oncology

## 2014-04-30 ENCOUNTER — Ambulatory Visit: Payer: Self-pay | Admitting: Internal Medicine

## 2014-04-30 ENCOUNTER — Encounter: Payer: Self-pay | Admitting: Internal Medicine

## 2014-06-16 ENCOUNTER — Ambulatory Visit
Admit: 2014-06-16 | Disposition: A | Discharge status: Home / Self Care | Payer: Self-pay | Attending: Oncology | Admitting: Oncology

## 2014-06-17 ENCOUNTER — Encounter: Payer: Self-pay | Admitting: Internal Medicine

## 2014-06-17 ENCOUNTER — Ambulatory Visit: Admit: 2014-06-17 | Disposition: A | Discharge status: Home / Self Care | Payer: Self-pay | Admitting: Internal Medicine

## 2014-06-25 ENCOUNTER — Other Ambulatory Visit: Payer: Self-pay | Admitting: Oncology

## 2014-06-25 DIAGNOSIS — Z85820 Personal history of malignant melanoma of skin: Secondary | ICD-10-CM

## 2014-07-25 ENCOUNTER — Encounter
Admission: RE | Admit: 2014-07-25 | Discharge: 2014-07-25 | Disposition: A | Discharge status: Home / Self Care | Payer: Medicare (Managed Care) | Source: Ambulatory Visit | Attending: Oncology | Admitting: Oncology

## 2014-07-25 DIAGNOSIS — Z85820 Personal history of malignant melanoma of skin: Secondary | ICD-10-CM

## 2014-07-25 DIAGNOSIS — J9811 Atelectasis: Secondary | ICD-10-CM | POA: Diagnosis not present

## 2014-07-25 HISTORY — DX: Heart failure, unspecified: I50.9

## 2014-07-25 LAB — GLUCOSE, CAPILLARY: Glucose-Capillary: 101 mg/dL — ABNORMAL HIGH (ref 65–99)

## 2014-07-25 MED ORDER — FLUDEOXYGLUCOSE F - 18 (FDG) INJECTION
12.5300 | Freq: Once | INTRAVENOUS | Status: AC | PRN
  Administered 2014-07-25: 12.53 via INTRAVENOUS

## 2014-08-12 DIAGNOSIS — I351 Nonrheumatic aortic (valve) insufficiency: Secondary | ICD-10-CM | POA: Insufficient documentation

## 2014-08-12 DIAGNOSIS — I340 Nonrheumatic mitral (valve) insufficiency: Secondary | ICD-10-CM

## 2015-03-01 DIAGNOSIS — Z923 Personal history of irradiation: Secondary | ICD-10-CM

## 2015-03-01 HISTORY — DX: Personal history of irradiation: Z92.3

## 2015-06-08 ENCOUNTER — Telehealth: Payer: Self-pay | Admitting: *Deleted

## 2015-06-08 NOTE — Telephone Encounter (Signed)
Has no fu appt and not seen since 06/16/14. This is a [facility] patient

## 2015-06-08 NOTE — Telephone Encounter (Signed)
-----   Message from Cephus Richer sent at 06/08/2015  9:48 AM EDT ----- Contact: [phone] Pt left message she has found a lump in her groin and wants someone to look at it. Please let me know if you need me to add pt on.

## 2015-06-08 NOTE — Telephone Encounter (Signed)
Per Dr Grayland Ormond, see Dr Oliva Bustard next week, please schedule

## 2015-06-17 ENCOUNTER — Encounter: Payer: Self-pay | Admitting: Oncology

## 2015-06-17 ENCOUNTER — Ambulatory Visit: Payer: Self-pay

## 2015-06-17 ENCOUNTER — Inpatient Hospital Stay: Payer: Medicare Other | Attending: Oncology | Admitting: Oncology

## 2015-06-17 VITALS — BP 121/70 | HR 68 | Temp 97.7°F | Resp 18 | Wt 165.3 lb

## 2015-06-17 DIAGNOSIS — R1900 Intra-abdominal and pelvic swelling, mass and lump, unspecified site: Secondary | ICD-10-CM

## 2015-06-17 DIAGNOSIS — R531 Weakness: Secondary | ICD-10-CM

## 2015-06-17 DIAGNOSIS — E538 Deficiency of other specified B group vitamins: Secondary | ICD-10-CM

## 2015-06-17 DIAGNOSIS — R634 Abnormal weight loss: Secondary | ICD-10-CM

## 2015-06-17 DIAGNOSIS — Z9221 Personal history of antineoplastic chemotherapy: Secondary | ICD-10-CM

## 2015-06-17 DIAGNOSIS — R5383 Other fatigue: Secondary | ICD-10-CM | POA: Diagnosis not present

## 2015-06-17 DIAGNOSIS — F17210 Nicotine dependence, cigarettes, uncomplicated: Secondary | ICD-10-CM | POA: Insufficient documentation

## 2015-06-17 DIAGNOSIS — E231 Drug-induced hypopituitarism: Secondary | ICD-10-CM

## 2015-06-17 DIAGNOSIS — K219 Gastro-esophageal reflux disease without esophagitis: Secondary | ICD-10-CM | POA: Diagnosis not present

## 2015-06-17 DIAGNOSIS — C439 Malignant melanoma of skin, unspecified: Secondary | ICD-10-CM | POA: Diagnosis not present

## 2015-06-17 DIAGNOSIS — N183 Chronic kidney disease, stage 3 unspecified: Secondary | ICD-10-CM | POA: Insufficient documentation

## 2015-06-17 DIAGNOSIS — R1909 Other intra-abdominal and pelvic swelling, mass and lump: Secondary | ICD-10-CM | POA: Diagnosis not present

## 2015-06-17 DIAGNOSIS — E039 Hypothyroidism, unspecified: Secondary | ICD-10-CM | POA: Insufficient documentation

## 2015-06-17 DIAGNOSIS — R609 Edema, unspecified: Secondary | ICD-10-CM

## 2015-06-17 DIAGNOSIS — I509 Heart failure, unspecified: Secondary | ICD-10-CM | POA: Diagnosis not present

## 2015-06-17 DIAGNOSIS — T43595S Adverse effect of other antipsychotics and neuroleptics, sequela: Secondary | ICD-10-CM | POA: Insufficient documentation

## 2015-06-17 DIAGNOSIS — R630 Anorexia: Secondary | ICD-10-CM | POA: Diagnosis not present

## 2015-06-17 DIAGNOSIS — Z79899 Other long term (current) drug therapy: Secondary | ICD-10-CM | POA: Diagnosis not present

## 2015-06-17 DIAGNOSIS — F329 Major depressive disorder, single episode, unspecified: Secondary | ICD-10-CM | POA: Insufficient documentation

## 2015-06-17 DIAGNOSIS — G251 Drug-induced tremor: Secondary | ICD-10-CM | POA: Diagnosis not present

## 2015-06-17 DIAGNOSIS — T451X5S Adverse effect of antineoplastic and immunosuppressive drugs, sequela: Secondary | ICD-10-CM | POA: Diagnosis not present

## 2015-06-17 DIAGNOSIS — C792 Secondary malignant neoplasm of skin: Secondary | ICD-10-CM | POA: Diagnosis not present

## 2015-06-17 DIAGNOSIS — M5126 Other intervertebral disc displacement, lumbar region: Secondary | ICD-10-CM | POA: Diagnosis not present

## 2015-06-17 DIAGNOSIS — C4370 Malignant melanoma of unspecified lower limb, including hip: Secondary | ICD-10-CM

## 2015-06-17 LAB — CBC WITH DIFFERENTIAL/PLATELET
BASOS ABS: 0.1 10*3/uL (ref 0–0.1)
BASOS PCT: 1 %
EOS PCT: 3 %
Eosinophils Absolute: 0.2 10*3/uL (ref 0–0.7)
HCT: 39 % (ref 35.0–47.0)
Hemoglobin: 13.8 g/dL (ref 12.0–16.0)
LYMPHS ABS: 2.7 10*3/uL (ref 1.0–3.6)
Lymphocytes Relative: 37 %
MCH: 32.5 pg (ref 26.0–34.0)
MCHC: 35.4 g/dL (ref 32.0–36.0)
MCV: 92 fL (ref 80.0–100.0)
MONO ABS: 0.4 10*3/uL (ref 0.2–0.9)
MONOS PCT: 6 %
NEUTROS ABS: 3.8 10*3/uL (ref 1.4–6.5)
NEUTROS PCT: 53 %
PLATELETS: 234 10*3/uL (ref 150–440)
RBC: 4.24 MIL/uL (ref 3.80–5.20)
RDW: 12.5 % (ref 11.5–14.5)
WBC: 7.2 10*3/uL (ref 3.6–11.0)

## 2015-06-17 LAB — COMPREHENSIVE METABOLIC PANEL
ALBUMIN: 4.2 g/dL (ref 3.5–5.0)
ALT: 12 U/L — AB (ref 14–54)
AST: 20 U/L (ref 15–41)
Alkaline Phosphatase: 52 U/L (ref 38–126)
Anion gap: 5 (ref 5–15)
BUN: 19 mg/dL (ref 6–20)
CHLORIDE: 106 mmol/L (ref 101–111)
CO2: 26 mmol/L (ref 22–32)
CREATININE: 1.17 mg/dL — AB (ref 0.44–1.00)
Calcium: 9 mg/dL (ref 8.9–10.3)
GFR calc Af Amer: >60 mL/min (ref >60)
GFR, EST NON AFRICAN AMERICAN: 52 mL/min — AB (ref >60)
GLUCOSE: 110 mg/dL — AB (ref 65–99)
POTASSIUM: 3.7 mmol/L (ref 3.5–5.1)
SODIUM: 137 mmol/L (ref 135–145)
Total Bilirubin: 0.5 mg/dL (ref 0.3–1.2)
Total Protein: 6.7 g/dL (ref 6.5–8.1)

## 2015-06-17 NOTE — Progress Notes (Signed)
Hx of melanoma.  Patient has found a new lump 3 1/2 weeks ago in right groin.  States she has lost weight without trying.  Appetite decreased.  States she is always hungry and sleepy.

## 2015-06-17 NOTE — Progress Notes (Signed)
[place] @ [facility] [phone]  Fax:(336) [place]: 1960-08-01  MR#: [id number]  [id number]  Patient Care Team: Provider Not In System as PCP - General Robert Bellow, MD as Consulting Physician (General Surgery)  CHIEF COMPLAINT:  Chief Complaint  Patient presents with  . Melanoma of lower extremity    Chief Complaint/Diagnosis:   1. 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 TX, Nx, M1 tumor, stage IV. 2. 4 weeks of high in the Intron therapy high dose in February of 2009. 3. Low-dose interferon started from March of 2009 4. 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 Biopsy of right thigh mass was positive  for melanoma. August of 2012 5. B-RAF not mutated   6. Started on  [facility] October, 2012 7. Chemotherapy after 2 cycles of treatment because of side effect 8. Vitamin B 12 deficiency INTERVAL HISTORY:  55 year old lady came today complaining of a writing Y no mass which she has noticed for weeks ago.  Not painful.  Patient had a previous history of weight loss appetite is poor.  No nausea no vomiting no diarrhea no difficulty passing urine patient is here for ongoing evaluation and treatment consideration  REVIEW OF SYSTEMS:   Gen. status: Patient is somewhat feeling weak and tired.  No chills or fever Lungs: No cough or shortness of breath GI: No nausea no vomiting as poor appetite has lost weight. GU: No dysuria or hematuria. Skin: No rash.  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 Otherwise, a complete review of systems is negatve.  PAST MEDICAL HISTORY: Past Medical History  Diagnosis Date  . Malignant melanoma of skin of lower limb, including hip ([place]) 2008    right thigh  . Personal  history of tobacco use, presenting hazards to health 2012  . Lumbar herniated disc 2010  . Personal history of malignant melanoma of skin 2012  . Breast screening, unspecified 2012  . GERD (gastroesophageal reflux disease) 2012  . CHF (congestive heart failure) ([place])     PAST SURGICAL HISTORY: Past Surgical History  Procedure Laterality Date  . Melanoma excision  2008    right thigh  . Breast cyst excision  2008  . Back surgery  2010  . Skin lesion excision Right 12-19-10    posterior  . Fracture surgery      rod removed from left femur 07/21/2014  Allergies:  No Known Allergies:   Significant History/PMH:   acid reflux:    edema:    tremors to hands from psych meds(seroquel):    Depression:    melanoma:    Melanoma right thigh:    microdiskectomy L4-L5: 2010   Lumpectomy: 1987  Preventive Screening:  Has patient had any of the following test? Colonscopy  Pap Smear (1)   Last Colonoscopy: 2014(1)   Last Pap Smear: 09/2010(1)    FAMILY HISTORY There is no significant family history of breast cancer, ovarian cancer, colon cancer ADVANCED DIRECTIVES:  Patient does not have any living will or healthcare power of attorney.  Information was given .  Available resources had been discussed.  We will follow-up on subsequent appointments regarding this issue HEALTH MAINTENANCE: Social History  Substance Use Topics  . Smoking status: Current Every Day Smoker -- 1.00 packs/day for 20 years  Types: Cigarettes  . Smokeless tobacco: None     Comment: electric  . Alcohol Use: Yes     Comment: ocassionally      Allergies  Allergen Reactions  . Clindamycin Shortness Of Breath  . Aripiprazole Other (See Comments)    Facial and head ticks  . Valproic Acid     Other reaction(s): Other (See Comments) Facial twitching    Current Outpatient Prescriptions  Medication Sig Dispense Refill  . calcium carbonate (OS-CAL) 600 MG TABS tablet Take 600 mg by mouth 2 (two)  times daily with a meal.    . furosemide (LASIX) 20 MG tablet Take 20 mg by mouth every other day.     . hydrocortisone (CORTEF) 10 MG tablet Take 25 mg by mouth as directed.     Marland Kitchen levothyroxine (SYNTHROID, LEVOTHROID) 112 MCG tablet     . metoprolol tartrate (LOPRESSOR) 25 MG tablet Take 25 mg by mouth once.    . Multiple Vitamin (MULTI-VITAMINS) TABS Take by mouth.    . pantoprazole (PROTONIX) 40 MG tablet Take 40 mg by mouth 2 (two) times daily.     Marland Kitchen venlafaxine XR (EFFEXOR-XR) 150 MG 24 hr capsule 150 mg every morning.     . venlafaxine XR (EFFEXOR-XR) 37.5 MG 24 hr capsule Take 37.5 mg by mouth daily after supper.     No current facility-administered medications for this visit.    OBJECTIVE:  Filed Vitals:   06/17/15 1424  BP: 121/70  Pulse: 68  Temp: 97.7 F (36.5 C)  Resp: 18     Body mass index is 25.15 kg/(m^2).    ECOG FS:1 - Symptomatic but completely ambulatory  PHYSICAL EXAM: GENERAL:  Well developed, well nourished, sitting comfortably in the exam room in no acute distress. MENTAL STATUS:  Alert and oriented to person, place and time.  ENT:  Oropharynx clear without lesion.  Tongue normal. Mucous membranes moist.  RESPIRATORY:  Clear to auscultation without rales, wheezes or rhonchi. CARDIOVASCULAR:  Regular rate and rhythm without murmur, rub or gallop. BREAST:  Not examined today ABDOMEN:  Soft, non-tender, with active bowel sounds, and no hepatosplenomegaly.  No masses. BACK:  No CVA tenderness.  No tenderness on percussion of the back or rib cage. SKIN:  No rashes, ulcers or lesions. EXTREMITIES: No edema, no skin discoloration or tenderness.  No palpable cords. LYMPH NODES: Palpable lymph node in the writing Y no area NEUROLOGICAL: Unremarkable. PSYCH:  Appropriate.   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 Palpable masses concerned regarding recurrent melanoma We will proceed with PET scan and if there is only single area of recurrent disease possibility of surgical intervention would be planned.  If there are multiple areas biopsied molecular profiling and further treatment option would be considered.  Discussed with the patient and she is agreeable  To it  Patient expressed understanding and was in agreement with this plan. She also understands that She can call clinic at any time with any questions, concerns, or complaints.    No matching staging information was found for the patient.  Forest Gleason, MD   06/17/2015 2:48 PM

## 2015-06-21 ENCOUNTER — Encounter: Payer: Self-pay | Admitting: Oncology

## 2015-06-22 ENCOUNTER — Ambulatory Visit
Admission: RE | Admit: 2015-06-22 | Discharge: 2015-06-22 | Disposition: A | Discharge status: Home / Self Care | Payer: Medicare Other | Source: Ambulatory Visit | Attending: Oncology | Admitting: Oncology

## 2015-06-22 DIAGNOSIS — R590 Localized enlarged lymph nodes: Secondary | ICD-10-CM | POA: Insufficient documentation

## 2015-06-22 DIAGNOSIS — I2510 Atherosclerotic heart disease of native coronary artery without angina pectoris: Secondary | ICD-10-CM | POA: Insufficient documentation

## 2015-06-22 DIAGNOSIS — C4370 Malignant melanoma of unspecified lower limb, including hip: Secondary | ICD-10-CM | POA: Diagnosis not present

## 2015-06-22 LAB — GLUCOSE, CAPILLARY: Glucose-Capillary: 90 mg/dL (ref 65–99)

## 2015-06-22 MED ORDER — FLUDEOXYGLUCOSE F - 18 (FDG) INJECTION
12.9700 | Freq: Once | INTRAVENOUS | Status: AC | PRN
  Administered 2015-06-22: 12.97 via INTRAVENOUS

## 2015-06-24 ENCOUNTER — Inpatient Hospital Stay (HOSPITAL_BASED_OUTPATIENT_CLINIC_OR_DEPARTMENT_OTHER): Payer: Medicare Other | Admitting: Oncology

## 2015-06-24 VITALS — BP 134/85 | HR 62 | Temp 96.7°F | Resp 18 | Wt 164.9 lb

## 2015-06-24 DIAGNOSIS — F329 Major depressive disorder, single episode, unspecified: Secondary | ICD-10-CM

## 2015-06-24 DIAGNOSIS — T451X5S Adverse effect of antineoplastic and immunosuppressive drugs, sequela: Secondary | ICD-10-CM

## 2015-06-24 DIAGNOSIS — R5383 Other fatigue: Secondary | ICD-10-CM

## 2015-06-24 DIAGNOSIS — R1900 Intra-abdominal and pelvic swelling, mass and lump, unspecified site: Secondary | ICD-10-CM

## 2015-06-24 DIAGNOSIS — F17210 Nicotine dependence, cigarettes, uncomplicated: Secondary | ICD-10-CM

## 2015-06-24 DIAGNOSIS — Z9221 Personal history of antineoplastic chemotherapy: Secondary | ICD-10-CM

## 2015-06-24 DIAGNOSIS — I509 Heart failure, unspecified: Secondary | ICD-10-CM

## 2015-06-24 DIAGNOSIS — E231 Drug-induced hypopituitarism: Secondary | ICD-10-CM

## 2015-06-24 DIAGNOSIS — C792 Secondary malignant neoplasm of skin: Secondary | ICD-10-CM | POA: Diagnosis not present

## 2015-06-24 DIAGNOSIS — R531 Weakness: Secondary | ICD-10-CM

## 2015-06-24 DIAGNOSIS — R630 Anorexia: Secondary | ICD-10-CM

## 2015-06-24 DIAGNOSIS — M5126 Other intervertebral disc displacement, lumbar region: Secondary | ICD-10-CM

## 2015-06-24 DIAGNOSIS — E039 Hypothyroidism, unspecified: Secondary | ICD-10-CM | POA: Diagnosis not present

## 2015-06-24 DIAGNOSIS — C439 Malignant melanoma of skin, unspecified: Secondary | ICD-10-CM

## 2015-06-24 DIAGNOSIS — E538 Deficiency of other specified B group vitamins: Secondary | ICD-10-CM

## 2015-06-24 DIAGNOSIS — R609 Edema, unspecified: Secondary | ICD-10-CM

## 2015-06-24 DIAGNOSIS — G251 Drug-induced tremor: Secondary | ICD-10-CM

## 2015-06-24 DIAGNOSIS — R1909 Other intra-abdominal and pelvic swelling, mass and lump: Secondary | ICD-10-CM | POA: Diagnosis not present

## 2015-06-24 DIAGNOSIS — K219 Gastro-esophageal reflux disease without esophagitis: Secondary | ICD-10-CM

## 2015-06-24 DIAGNOSIS — C4370 Malignant melanoma of unspecified lower limb, including hip: Secondary | ICD-10-CM

## 2015-06-24 DIAGNOSIS — Z79899 Other long term (current) drug therapy: Secondary | ICD-10-CM

## 2015-06-24 DIAGNOSIS — T43595S Adverse effect of other antipsychotics and neuroleptics, sequela: Secondary | ICD-10-CM

## 2015-06-24 DIAGNOSIS — R634 Abnormal weight loss: Secondary | ICD-10-CM

## 2015-06-24 NOTE — Progress Notes (Signed)
Patient here for PET results. 

## 2015-06-29 ENCOUNTER — Encounter: Payer: Self-pay | Admitting: Oncology

## 2015-06-29 NOTE — Progress Notes (Signed)
[place] @ [facility] Telephone:(336) [phone]  Fax:(336) [place]: 05-02-1960  MR#: [id number]  [id number]  Patient Care Team: Provider Not In System as PCP - General Robert Bellow, MD as Consulting Physician (General Surgery)  CHIEF COMPLAINT:  Chief Complaint  Patient presents with  . Melanoma    Chief Complaint/Diagnosis:   1. 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 TX, Nx, M1 tumor, stage IV. 2. 4 weeks of high in the Intron therapy high dose in February of 2009. 3. Low-dose interferon started from March of 2009 4. 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 Biopsy of right thigh mass was positive  for melanoma. August of 2012 5. B-RAF not mutated   6. Started on  [facility] October, 2012 7. Chemotherapy after 2 cycles of treatment because of side effect 8. Vitamin B 12 deficiency  9.Recent PET scan shows recurrent disease with   RIGHT INGUINAL adenopathy (April, 2017) INTERVAL HISTORY:  55 year old lady came today complaining of RIGHT INGUINAL    mass which she has noticed for weeks ago.  Not painful.  Patient had a previous history of weight loss appetite is poor.  No nausea no vomiting no diarrhea no difficulty passing urine patient is here for ongoing evaluation and treatment consideration Patient underwent restaging PET scan.  He will with the family to discuss the results and further planning of treatment   REVIEW OF SYSTEMS:   Gen. status: Patient is somewhat feeling weak and tired.  No chills or fever Lungs: No cough or shortness of breath GI: No nausea no vomiting as poor appetite has lost weight. GU: No dysuria or hematuria. Skin: No rash.  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 Otherwise, a  complete review of systems is negatve.  PAST MEDICAL HISTORY: Past Medical History  Diagnosis Date  . Malignant melanoma of skin of lower limb, including hip ([place]) 2008    right thigh  . Personal history of tobacco use, presenting hazards to health 2012  . Lumbar herniated disc 2010  . Personal history of malignant melanoma of skin 2012  . Breast screening, unspecified 2012  . GERD (gastroesophageal reflux disease) 2012  . CHF (congestive heart failure) ([place])     PAST SURGICAL HISTORY: Past Surgical History  Procedure Laterality Date  . Melanoma excision  2008    right thigh  . Breast cyst excision  2008  . Back surgery  2010  . Skin lesion excision Right 12-19-10    posterior  . Fracture surgery      rod removed from left femur 07/21/2014  Allergies:  No Known Allergies:   Significant History/PMH:   acid reflux:    edema:    tremors to hands from psych meds(seroquel):    Depression:    melanoma:    Melanoma right thigh:    microdiskectomy L4-L5: 2010   Lumpectomy: 1987  Preventive Screening:  Has patient had any of the following test? Colonscopy  Pap Smear (1)   Last Colonoscopy: 2014(1)   Last Pap Smear: 09/2010(1)    FAMILY HISTORY There is no significant family history of breast cancer, ovarian cancer, colon cancer ADVANCED DIRECTIVES:  Patient does not have any living will or healthcare power of attorney.  Information was given .  Available resources had been discussed.  We will follow-up on subsequent appointments regarding this issue HEALTH MAINTENANCE: Social History  Substance Use Topics  . Smoking status: Current Every Day Smoker -- 1.00 packs/day for 20 years    Types: Cigarettes  . Smokeless tobacco: None     Comment: electric  . Alcohol Use: Yes     Comment: ocassionally      Allergies  Allergen Reactions  . Clindamycin Shortness Of Breath  . Aripiprazole Other (See Comments)    Facial and head ticks  . Valproic Acid     Other  reaction(s): Other (See Comments) Facial twitching    Current Outpatient Prescriptions  Medication Sig Dispense Refill  . calcium carbonate (OS-CAL) 600 MG TABS tablet Take 600 mg by mouth 2 (two) times daily with a meal.    . furosemide (LASIX) 20 MG tablet Take 20 mg by mouth every other day.     . hydrocortisone (CORTEF) 10 MG tablet Take 25 mg by mouth as directed.     Marland Kitchen levothyroxine (SYNTHROID, LEVOTHROID) 112 MCG tablet     . metoprolol tartrate (LOPRESSOR) 25 MG tablet Take 25 mg by mouth once.    . Multiple Vitamin (MULTI-VITAMINS) TABS Take by mouth.    . pantoprazole (PROTONIX) 40 MG tablet Take 40 mg by mouth 2 (two) times daily.     Marland Kitchen venlafaxine XR (EFFEXOR-XR) 150 MG 24 hr capsule 150 mg every morning.     . venlafaxine XR (EFFEXOR-XR) 37.5 MG 24 hr capsule Take 37.5 mg by mouth daily after supper.     No current facility-administered medications for this visit.    OBJECTIVE:  Filed Vitals:   06/24/15 1003  BP: 134/85  Pulse: 62  Temp: 96.7 F (35.9 C)  Resp: 18     Body mass index is 25.08 kg/(m^2).    ECOG FS:1 - Symptomatic but completely ambulatory  PHYSICAL EXAM: GENERAL:  Well developed, well nourished, sitting comfortably in the exam room in no acute distress. MENTAL STATUS:  Alert and oriented to person, place and time.  ENT:  Oropharynx clear without lesion.  Tongue normal. Mucous membranes moist.  RESPIRATORY:  Clear to auscultation without rales, wheezes or rhonchi. CARDIOVASCULAR:  Regular rate and rhythm without murmur, rub or gallop. BREAST:  Not examined today ABDOMEN:  Soft, non-tender, with active bowel sounds, and no hepatosplenomegaly.  No masses. BACK:  No CVA tenderness.  No tenderness on percussion of the back or rib cage. SKIN:  No rashes, ulcers or lesions. EXTREMITIES: No edema, no skin discoloration or tenderness.  No palpable cords. LYMPH NODES: Palpable lymph node in the writing Y no area NEUROLOGICAL: Unremarkable. PSYCH:   Appropriate.   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 Bary Castilla possibility of resection. I do not believe any radical dissection would add overall outcome and may increase morbidity.  Case will be discussed in tumor conference    No matching staging information was found for the patient.  Forest Gleason, MD   06/29/2015  7:47 AM

## 2015-06-30 DIAGNOSIS — R002 Palpitations: Secondary | ICD-10-CM | POA: Diagnosis not present

## 2015-06-30 DIAGNOSIS — I5022 Chronic systolic (congestive) heart failure: Secondary | ICD-10-CM | POA: Diagnosis not present

## 2015-06-30 DIAGNOSIS — I10 Essential (primary) hypertension: Secondary | ICD-10-CM | POA: Diagnosis not present

## 2015-07-08 ENCOUNTER — Encounter: Payer: Self-pay | Admitting: General Surgery

## 2015-07-08 ENCOUNTER — Ambulatory Visit (INDEPENDENT_AMBULATORY_CARE_PROVIDER_SITE_OTHER): Payer: Medicare Other | Admitting: General Surgery

## 2015-07-08 VITALS — BP 124/68 | Resp 12 | Ht 67.0 in | Wt 166.0 lb

## 2015-07-08 DIAGNOSIS — R599 Enlarged lymph nodes, unspecified: Secondary | ICD-10-CM | POA: Diagnosis not present

## 2015-07-08 DIAGNOSIS — C4371 Malignant melanoma of right lower limb, including hip: Secondary | ICD-10-CM | POA: Diagnosis not present

## 2015-07-08 DIAGNOSIS — R590 Localized enlarged lymph nodes: Secondary | ICD-10-CM | POA: Insufficient documentation

## 2015-07-08 NOTE — H&P (Signed)
HPI  Patricia Li is a 55 y.o. female here today for an evaluation for right inguinal lymphadenopathy. She first noticed a hard mass the beginning of April. She had a PET scan done 06/22/15. Denies redness, fever. Positive for fatigue still exhausted after sleeping. She is eating, urinating and moving bowels regularly. In August of 2012 a right posterior thigh mass was excised showing evidence of malignant melanoma. Her Sister Patricia Cole, RN, and daughter Patricia Li present for exam.  The patient was last seen in 2012 for excision of a in-transit metastasis on the posterior thigh. She was treated with immunotherapy at that time receiving of 4 planned treatments due to side effects. The patient had had a negative PET/CT last year, but was identified with right inguinal adenopathy earlier this spring that was PET positive as the sole abnormality. Patient is seen now to discussed excision of the lymph node.  Her sister reports that she is very concerned about the possibility of lymphedema.  I personal reviewed the patient's history.  HPI  Past Medical History   Diagnosis  Date   .  Malignant melanoma of skin of lower limb, including hip ([place])  2008     right thigh   .  Lumbar herniated disc  2010   .  Personal history of malignant melanoma of skin  2012   .  GERD (gastroesophageal reflux disease)  2012   .  CHF (congestive heart failure) ([place])  2016     Dr. Nehemiah Li    Past Surgical History   Procedure  Laterality  Date   .  Melanoma excision   2008     right thigh   .  Back surgery   2010   .  Skin lesion excision  Right  12-19-10     posterior   .  Colonoscopy   2013     [facility]   .  Fracture surgery       rod removed from left femur 07/21/2014   .  Breast cyst excision   1985    Family History   Problem  Relation  Age of Onset   .  COPD  Father    .  Cancer  Sister      Colon, sister    Social History  Social History   Substance Use Topics   .  Smoking status:  Current Every  Day Smoker -- 1.00 packs/day for 20 years     Types:  Cigarettes   .  Smokeless tobacco:  None   .  Alcohol Use:  0.0 oz/week     0 Standard drinks or equivalent per week      Comment: ocassionally    Allergies   Allergen  Reactions   .  Clindamycin  Shortness Of Breath   .  Aripiprazole  Other (See Comments)     Facial and head ticks   .  Valproic Acid      Other reaction(s): Other (See Comments)  Facial twitching    Current Outpatient Prescriptions   Medication  Sig  Dispense  Refill   .  calcium carbonate (OS-CAL) 600 MG TABS tablet  Take 600 mg by mouth 2 (two) times daily with a meal.     .  furosemide (LASIX) 20 MG tablet  Take 20 mg by mouth every other day.     .  hydrocortisone (CORTEF) 10 MG tablet  Take 25 mg by mouth as directed.     Marland Kitchen  levothyroxine (  SYNTHROID, LEVOTHROID) 112 MCG tablet      .  metoprolol tartrate (LOPRESSOR) 25 MG tablet  Take 25 mg by mouth once.     .  Multiple Vitamin (MULTI-VITAMINS) TABS  Take by mouth.     .  pantoprazole (PROTONIX) 40 MG tablet  Take 40 mg by mouth 2 (two) times daily.     Marland Kitchen  venlafaxine XR (EFFEXOR-XR) 150 MG 24 hr capsule  150 mg every morning.      No current facility-administered medications for this visit.    Review of Systems  Review of Systems  Constitutional: Positive for fatigue.  Respiratory: Negative.  Cardiovascular: Negative.   Blood pressure 124/68, resp. rate 12, height 5\' 7"  (1.702 m), weight 166 lb (75.297 kg), last menstrual period 02/28/1997.  Physical Exam  Physical Exam  Constitutional: She is oriented to person, place, and time. She appears well-developed and well-nourished.  Eyes: Conjunctivae are normal. No scleral icterus.  Neck: Neck supple. No thyromegaly present.  Cardiovascular: Normal rate and regular rhythm.  Pulmonary/Chest: Effort normal and breath sounds normal.  Lymphadenopathy:  She has no cervical adenopathy.  She has no axillary adenopathy.  Right: Inguinal (3cm node) adenopathy  present.  Neurological: She is alert and oriented to person, place, and time.  Skin: Skin is warm and dry.   Data Reviewed  06/22/2015 PET/CT reviewed. Single, large, hot superficial inguinal node on the right side. Images reviewed with the patient and her family.  Assessment   Suspected metastatic malignant melanoma.   Plan   The patient's case had been reviewed with her medical oncologist. Pros and cons of formal node dissection reviewed. Considering the patient's abject fear of lymphedema and the absence of additional gross disease in the groin, plans at present are for resection of the single node and then later consideration of radiation therapy to the area.  While she has a history of well-controlled CHF based on 2016 evaluation with cardiology, she reports no clinical symptoms attest time and should tolerate anesthesia well.   Revived and discussed risks and options with patient to remove right inguinal lymph node.  Patient's surgery has been scheduled for 07-17-15 at [facility].  This information has been scribed by Patricia Li, CMA  PCP: Dr. Franne Li  Patricia Li  07/08/2015, 9:03 PM

## 2015-07-08 NOTE — Patient Instructions (Addendum)
Lymphadenopathy Lymphadenopathy refers to swollen or enlarged lymph glands, also called lymph nodes. Lymph glands are part of your body's defense (immune) system, which protects the body from infections, germs, and diseases. Lymph glands are found in many locations in your body, including the neck, underarm, and groin.  Many things can cause lymph glands to become enlarged. When your immune system responds to germs, such as viruses or bacteria, infection-fighting cells and fluid build up. This causes the glands to grow in size. Usually, this is not something to worry about. The swelling and any soreness often go away without treatment. However, swollen lymph glands can also be caused by a number of diseases. Your health care provider may do various tests to help determine the cause. If the cause of your swollen lymph glands cannot be found, it is important to monitor your condition to make sure the swelling goes away. HOME CARE INSTRUCTIONS Watch your condition for any changes. The following actions may help to lessen any discomfort you are feeling:  Get plenty of rest.  Take medicines only as directed by your health care provider. Your health care provider may recommend over-the-counter medicines for pain.  Apply moist heat compresses to the site of swollen lymph nodes as directed by your health care provider. This can help reduce any pain.  Check your lymph nodes daily for any changes.  Keep all follow-up visits as directed by your health care provider. This is important. SEEK MEDICAL CARE IF:  Your lymph nodes are still swollen after 2 weeks.  Your swelling increases or spreads to other areas.  Your lymph nodes are hard, seem fixed to the skin, or are growing rapidly.  Your skin over the lymph nodes is red and inflamed.  You have a fever.  You have chills.  You have fatigue.  You develop a sore throat.  You have abdominal pain.  You have weight loss.  You have night  sweats. SEEK IMMEDIATE MEDICAL CARE IF:  You notice fluid leaking from the area of the enlarged lymph node.  You have severe pain in any area of your body.  You have chest pain.  You have shortness of breath.   This information is not intended to replace advice given to you by your health care provider. Make sure you discuss any questions you have with your health care provider.   Document Released: 11/24/2007 Document Revised: 03/07/2014 Document Reviewed: 09/19/2013 Elsevier Interactive Patient Education 2016 [employer].  Patient's surgery has been scheduled for 07-17-15 at [facility].

## 2015-07-08 NOTE — Progress Notes (Signed)
Patient ID: Patricia Li, female   DOB: [birth year], 55 y.o.   MRN: [id number]  Chief Complaint  Patient presents with  . Lymphadenopathy    Right Inguinal     HPI Patricia Li is a 55 y.o. female here today for an evaluation for right inguinal lymphadenopathy. She first noticed a hard mass the beginning of April. She had a PET scan done 06/22/15. Denies redness, fever. Positive for fatigue still exhausted after sleeping. She is eating, urinating and moving bowels regularly. In August of 2012 a right posterior thigh mass was excised showing evidence of malignant melanoma. Her Sister Adalberto Cole, RN, and daughter Patricia Li present for exam.   The patient was last seen in 2012 for excision of a in-transit metastasis on the posterior thigh. She was treated with immunotherapy at that time receiving of 4 planned treatments due to side effects. The patient had had a negative PET/CT last year, but was identified with right inguinal adenopathy earlier this spring that was PET positive as the sole abnormality. Patient is seen now to discussed excision of the lymph node.  Her sister reports that she is very concerned about the possibility of lymphedema.  I personal reviewed the patient's history.   HPI  Past Medical History  Diagnosis Date  . Malignant melanoma of skin of lower limb, including hip ([place]) 2008    right thigh  . Lumbar herniated disc 2010  . Personal history of malignant melanoma of skin 2012  . GERD (gastroesophageal reflux disease) 2012  . CHF (congestive heart failure) ([place]) 2016    Dr. Nehemiah Massed    Past Surgical History  Procedure Laterality Date  . Melanoma excision  2008    right thigh  . Back surgery  2010  . Skin lesion excision Right 12-19-10    posterior  . Colonoscopy  2013    [facility]  . Fracture surgery      rod removed from left femur 07/21/2014  . Breast cyst excision  1985    Family History  Problem Relation Age of Onset  . COPD Father   . Cancer  Sister     Colon, sister    Social History Social History  Substance Use Topics  . Smoking status: Current Every Day Smoker -- 1.00 packs/day for 20 years    Types: Cigarettes  . Smokeless tobacco: None  . Alcohol Use: 0.0 oz/week    0 Standard drinks or equivalent per week     Comment: ocassionally    Allergies  Allergen Reactions  . Clindamycin Shortness Of Breath  . Aripiprazole Other (See Comments)    Facial and head ticks  . Valproic Acid     Other reaction(s): Other (See Comments) Facial twitching    Current Outpatient Prescriptions  Medication Sig Dispense Refill  . calcium carbonate (OS-CAL) 600 MG TABS tablet Take 600 mg by mouth 2 (two) times daily with a meal.    . furosemide (LASIX) 20 MG tablet Take 20 mg by mouth every other day.     . hydrocortisone (CORTEF) 10 MG tablet Take 25 mg by mouth as directed.     Marland Kitchen levothyroxine (SYNTHROID, LEVOTHROID) 112 MCG tablet     . metoprolol tartrate (LOPRESSOR) 25 MG tablet Take 25 mg by mouth once.    . Multiple Vitamin (MULTI-VITAMINS) TABS Take by mouth.    . pantoprazole (PROTONIX) 40 MG tablet Take 40 mg by mouth 2 (two) times daily.     Marland Kitchen venlafaxine XR (EFFEXOR-XR)  150 MG 24 hr capsule 150 mg every morning.      No current facility-administered medications for this visit.    Review of Systems Review of Systems  Constitutional: Positive for fatigue.  Respiratory: Negative.   Cardiovascular: Negative.     Blood pressure 124/68, resp. rate 12, height 5\' 7"  (1.702 m), weight 166 lb (75.297 kg), last menstrual period 02/28/1997.  Physical Exam Physical Exam  Constitutional: She is oriented to person, place, and time. She appears well-developed and well-nourished.  Eyes: Conjunctivae are normal. No scleral icterus.  Neck: Neck supple. No thyromegaly present.  Cardiovascular: Normal rate and regular rhythm.   Pulmonary/Chest: Effort normal and breath sounds normal.  Lymphadenopathy:    She has no cervical  adenopathy.    She has no axillary adenopathy.       Right: Inguinal (3cm node) adenopathy present.  Neurological: She is alert and oriented to person, place, and time.  Skin: Skin is warm and dry.    Data Reviewed 06/22/2015 PET/CT reviewed. Single, large, hot superficial inguinal node on the right side. Images reviewed with the patient and her family.  Assessment    Suspected metastatic malignant melanoma.    Plan    The patient's case had been reviewed with her medical oncologist. Pros and cons of formal node dissection reviewed. Considering the patient's abject fear of lymphedema and the absence of additional gross disease in the groin, plans at present are for resection of the single node and then later consideration of radiation therapy to the area.  While she has a history of well-controlled CHF based on 2016 evaluation with cardiology, she reports no clinical symptoms attest time and should tolerate anesthesia well.     Revived and discussed risks and options with patient to remove right inguinal lymph node.  Patient's surgery has been scheduled for 07-17-15 at [facility].   This information has been scribed by Verlene Mayer, CMA   PCP: Dr. Franne Grip  Robert Bellow 07/08/2015, 9:03 PM

## 2015-07-10 ENCOUNTER — Inpatient Hospital Stay
Admission: RE | Admit: 2015-07-10 | Discharge: 2015-07-10 | Disposition: A | Discharge status: Home / Self Care | Payer: Self-pay | Source: Ambulatory Visit

## 2015-07-10 ENCOUNTER — Ambulatory Visit
Admission: RE | Admit: 2015-07-10 | Discharge: 2015-07-10 | Disposition: A | Discharge status: Home / Self Care | Payer: Self-pay | Source: Ambulatory Visit | Attending: General Surgery | Admitting: General Surgery

## 2015-07-10 DIAGNOSIS — I10 Essential (primary) hypertension: Secondary | ICD-10-CM | POA: Diagnosis not present

## 2015-07-10 DIAGNOSIS — K219 Gastro-esophageal reflux disease without esophagitis: Secondary | ICD-10-CM | POA: Diagnosis not present

## 2015-07-10 DIAGNOSIS — F17200 Nicotine dependence, unspecified, uncomplicated: Secondary | ICD-10-CM | POA: Diagnosis not present

## 2015-07-10 DIAGNOSIS — I509 Heart failure, unspecified: Secondary | ICD-10-CM | POA: Diagnosis not present

## 2015-07-10 DIAGNOSIS — E039 Hypothyroidism, unspecified: Secondary | ICD-10-CM | POA: Diagnosis not present

## 2015-07-10 DIAGNOSIS — E119 Type 2 diabetes mellitus without complications: Secondary | ICD-10-CM | POA: Diagnosis not present

## 2015-07-10 DIAGNOSIS — C774 Secondary and unspecified malignant neoplasm of inguinal and lower limb lymph nodes: Secondary | ICD-10-CM | POA: Diagnosis not present

## 2015-07-10 NOTE — Pre-Procedure Instructions (Signed)
Patricia Dibble, MD - 06/30/2015 8:45 AM EDT Formatting of this note may be different from the original. Established Patient Visit   Chief Complaint: Chief Complaint  Patient presents with  . Follow-up  6 mo, I am dealing with more cancer  . Palpitations  I feel some fast heart rate at night  . Cardiomyopathy  . Congestive Heart Failure  Date of Service: 06/30/2015 Date of Birth: 18-Apr-1960 PCP: Patricia Ready, MD  History of Present Illness: [name] is a 55 y.o.female patient  Palpitations The patient has a chronic problem of palpitations over the last 2 years stable occurring at rest associated with a fluttering sensation. They may be relived by nothing. 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 Probable cause includes Idiopathic for which has been discussed with the patient. Symptoms include dyspnea and lower extremity edema and fatigue stable and the Patient appears euvolemic.. Current medications include beta blocker and diuretic without side effects. Benign Essential Hypertension The patient does have benign essential hypertension which they are receiving appropriate medication management. We have reviewed current treatment medications below and they appear stable at this time. This medical regimen has been tolerated fairly well at this time. It appears that they have been reaching appropriate blood pressure goals and have been educated on the risks and benefits of this medication management. There has also been a discussion of low-sodium diet for improved long-term control. There appears not to be any secondary causes of this condition.  Past Medical and Surgical History  Past Medical History Past Medical History  Diagnosis Date  . Adrenal insufficiency (CMS-HCC)   . Bipolar depression (CMS-HCC)  . Cardiomyopathy, secondary (CMS-HCC)  . Cigarette smoker  . Depression, unspecified  . GERD (gastroesophageal reflux disease)  . Hypertension  . Hypothyroidism, unspecified  . Melanoma (CMS-HCC)  stage IV metastic, primary site unknown  . Osteoporosis, post-menopausal  . Overactive bladder  . 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 Supraglottitis 01/2014   Past Surgical History She has a past surgical history that includes melanoma resection (2008 & 2012); microdiskectomy; Laminotomy Reexploration Posterior Lumbar W/Nerve Decomp & Discectomy (12/26/2008); colonoscopy (08/01/2012); egd (08/01/2012); and Broken femur (Left).   Medications and Allergies  Current Medications  Current Outpatient Prescriptions  Medication Sig Dispense Refill  . calcium carbonate (TUMS E-X) 300 mg (750 mg) chewable tablet Take 300 mg of elemental by mouth once daily.  . FUROsemide (LASIX) 20 MG tablet Take 1 tablet (20 mg total) by mouth every other day. 30 tablet 11  . hydrocortisone (CORTEF) 10 MG tablet TAKE 1 & 1/2 TABLETS BY MOUTH IN THE MORNING AND 1/2 TABLET BY MOUTH IN THE AFTERNOON. 180 tablet 1  . levothyroxine (SYNTHROID, LEVOTHROID) 112 MCG tablet TAKE ONE TABLET BY MOUTH DAILY 90 tablet 0  . meloxicam (MOBIC) 7.5 MG tablet Take 7.5 mg by mouth once daily. Reported on 06/24/2015  . metoprolol tartrate (LOPRESSOR) 25 MG tablet Take 0.5 tablets (12.5 mg total) by mouth 2 (two) times daily. 30 tablet 11  . multivitamin tablet Take 1 tablet by mouth once daily.  . pantoprazole (PROTONIX) 20 MG DR tablet Take 2 tablets (40 mg total) by mouth 2 (two) times daily. MUST MAKE APPT FOR FURTHER REFILLS. 120 tablet 1  . venlafaxine (EFFEXOR-XR) 75 MG XR capsule Take 75 mg by mouth 2 (two)  times daily. Takes 150mg  daily 1   No current facility-administered medications for this visit.   Allergies: Clindamycin; Abilify [aripiprazole]; Depakote [divalproex]; and  Valproic acid  Social and Family History  Social History reports that she has been smoking Cigarettes. She has been smoking about 0.50 packs per day. She has never used smokeless tobacco. She reports that she drinks alcohol. She reports that she does not use illicit drugs.  Family History Family History  Problem Relation Age of Onset  . Coronary artery disease Father  . Hypertension Father  . Hyperlipidemia Father  . Hypothyroidism Mother  . Stroke Paternal Grandmother  . Breast cancer Maternal Aunt  . Colon polyps Sister   Review of Systems   Review of Systems  Positive for palps Negative for weight gain weight loss, weakness, vision change, hearing loss, cough, congestion, PND, orthopnea, heartburn, diaphoresis, vomiting, diarrhea, bloody stool, melena, stomach pain, extremity pain, leg weakness, leg cramping, leg blood clots, headache, blackouts, nosebleed, trouble swallowing, mouth pain, urinary frequency, urination at night, muscle weakness, skin lesions, skin rashes, tingling ,ulcers, numbness, anxiety, and/or depression Physical Examination   Vitals: Visit Vitals  . BP (P) 122/64  . Pulse (P) 68  . Resp (P) 14  . Ht (P) 170.2 cm (5\' 7" )  . Wt (P) 74.4 kg (164 lb)  . BMI (P) 25.69 kg/m2   Ht:(P) 170.2 cm (5\' 7" ) Wt:(P) 74.4 kg (164 lb) [id number] surface area is 1.88 meters squared (pended). Body mass index is 25.69 kg/(m^2) (pended). Appearance: well appearing in no acute distress HEENT: Pupils equally reactive to light and accomodation, no xanthalasma  Neck: Supple, no apparent thyromegaly, masses, or lymphadenopathy  Lungs: normal respiratory effort; no crackles, no rhonchi, no wheezes Heart: Regular rate and rhythm. Normal S1 S2 No gallops, murmur, no rub, PMI is normal size and placement. carotid upstroke normal without bruit. Jugular venous pressure is normal Abdomen: soft, nontender, with normal bowel sounds. No apparent hepatosplenomegally. Abdominal aorta is  normal size without bruit Extremities: no edema no clubbing, no cyanosis Peripheral Pulses: 2+ in upper extremities, 2+ femoral pulses bilaterally, 2+lower extremity  Musculoskeletal; Normal muscle tone without kyphosis Neurological: Oriented and Alert, Cranial nerves intact  Assessment   55 y.o. female with  Encounter Diagnoses  Name Primary?  . Chronic systolic CHF (congestive heart failure), NYHA class 2 (CMS-HCC) Yes  . Benign essential HTN  . Palpitations   Plan  -no further interventions of palpitations which appear to be benign in nature and stable at this time. We have discussed the treatment options including diet and exercise and treatment of other lifestyle measures which may reduce symptoms. Further consideration medical management as needed for future symptom relief. -We have had a discussion about morbidity and mortality of cardiomyopathy and the appropriate medication management for risk reduction in symptoms and hospitalization. The patient understands these risks and benefits. We plan to continue treatment including blood pressure control, diet and exercise, and medication management. -Hypertension medication management listed above has been reviewed and discussed with the patient. We will continue current medical regimen at this time for reduction of risk of cardiovascular disease. The patient will report any new or future change of blood pressure for need in potential treatment changes. -There was a long discussion today about the hazards of sodium in the setting of congestive heart failure and hypertension. We have instructed the patient to lower sodium content to less than 2000 mg per day using the DASH diet to reduce the possibility of pulmonary  edema, lower extremity edema, hypertension, an exacerbation of congestive heart failure.  No orders of the defined types were placed in this encounter.  Return in about 6 months (around 12/31/2015).  Patricia Dibble, MD     Plan of Treatment - as of this encounter Upcoming Encounters Upcoming Encounters  Date Type Specialty Care Team Description  12/18/2015 Ancillary Orders Lab Germain Osgood, MD  [place]  [facility] [place], [place] [address]  [phone]  [phone] ([phone]    12/25/2015 Office Visit Endocrinology Solum, Felipa Evener, MD  [place]  [facility] [place], [place] [address]  [phone]  [phone] ([phone]    12/25/2015 Office Visit Cardiology Patricia Dibble, MD  [address]  [facility]  [place], [place] [address]  [phone]  [phone] (Fax)    Visit Diagnoses - in this encounter Diagnosis  Chronic systolic CHF (congestive heart failure), NYHA class 2 (CMS-HCC) - Primary  Benign essential HTN  Palpitations  Discontinued Medications - as of this encounter Prescription Sig. Discontinue Reason Start Date End Date  diazepam (VALIUM) 10 MG tablet  Take 10 mg by mouth every 12 (twelve) hours as needed for Anxiety. Reported on 06/30/2015   06/30/2015  Document Information Service Providers Document Coverage Dates May. 02, 2017 - May. 02, 2017 [facility] [phone] (Work) [place], [place] [address] Encounter Providers Bruce Lenn Sink MD (Attending) [phone] (Work) [phone] (Fax)  [place] [facility] [place],  [address]

## 2015-07-10 NOTE — Patient Instructions (Signed)
  Your procedure is scheduled on: 5/19 Fri Report to Day Surgery. To find out your arrival time please call [phone] between 1PM - 3PM on 5/18 Thurs.  Remember: Instructions that are not followed completely may result in serious medical risk, up to and including death, or upon the discretion of your surgeon and anesthesiologist your surgery may need to be rescheduled.    __x__ 1. Do not eat food or drink liquids after midnight. No gum chewing or hard candies.     __x__ 2. No Alcohol for 24 hours before or after surgery.   ____ 3. Bring all medications with you on the day of surgery if instructed.    __x__ 4. Notify your doctor if there is any change in your medical condition     (cold, fever, infections).     Do not wear jewelry, make-up, hairpins, clips or nail polish.  Do not wear lotions, powders, or perfumes. You may wear deodorant.  Do not shave 48 hours prior to surgery. Men may shave face and neck.  Do not bring valuables to the hospital.    [facility] is not responsible for any belongings or valuables.               Contacts, dentures or bridgework may not be worn into surgery.  Leave your suitcase in the car. After surgery it may be brought to your room.  For patients admitted to the hospital, discharge time is determined by your                treatment team.   Patients discharged the day of surgery will not be allowed to drive home.   Please read over the following fact sheets that you were given:   Surgical Site Infection Prevention   ___x_ Take these medicines the morning of surgery with A SIP OF WATER:                       1.levothyroxine (SYNTHROID, LEVOTHROID) 112 MCG tablet  2. metoprolol tartrate (LOPRESSOR) 25 MG tablet  3. pantoprazole (PROTONIX) 40 MG tablet take night before and morning of surgery  4.  5.  6.  ____ Fleet Enema (as directed)   x____ Use CHG Soap as directed  ____ Use inhalers on the day of surgery  ____ Stop metformin 2  days prior to surgery    ____ Take 1/2 of usual insulin dose the night before surgery and none on the morning of surgery.   ____ Stop Coumadin/Plavix/aspirin on  _x___ Stop Anti-inflammatories on tylenol only   ____ Stop supplements until after surgery.    ____ Bring C-Pap to the hospital.

## 2015-07-16 NOTE — Pre-Procedure Instructions (Signed)
Patricia Li  Result Narrative                      CARDIOLOGY DEPARTMENT                        Sees, [name] #: [id number]         [address] [name], [name]             Date: 04/21/2014 08:57 AM                                                                  Adult Female Age: 55 yrs                     ECHOCARDIOGRAM REPORT                        Outpatient          STUDY:CHEST WALL         TAPE:0000:00: 0:00:00          KC::KCWC           ECHO:Yes   DOPPLER:Yes  FILE:[id number]              MD1:  CHOSKI, JANAK          COLOR:Yes  CONTRAST:No       MACHINE:Phil[place]            Height: 67 in      RV BIOPSY:No         3D:No    SOUND QLTY:Moderate           Weight: 159 lb         MEDIUM:None                                                                  BSA: 1.8 m2 ___________________________________________________________________________________________        HISTORY:DOE, Palpitations         REASON:Assess, LV function     INDICATION:R00.2 Palpitations, R06.02 SOB (shortness of breath)  ___________________________________________________________________________________________ ECHOCARDIOGRAPHIC MEASUREMENTS 2D DIMENSIONS AORTA             Values      Normal Range      MAIN PA          Values      Normal Range  Annulus:  nm*       [2.1 - 2.5]                PA Main:  nm*       [1.5 - 2.1]         Aorta Sin:  nm*       [2.7 - 3.3]       RIGHT VENTRICLE       ST Junction:  nm*       [2.3 - 2.9]                RV Base:  nm*       [ < 4.2]         Asc.Aorta:  nm*       [2.3 - 3.1]                 RV Mid:  nm*       [ < 3.5]  LEFT VENTRICLE                                        RV Length:  nm*       [ < 8.6]             LVIDd:  5.5 cm    [3.9 - 5.3]       INFERIOR VENA CAVA             LVIDs:  4.5 cm                               Max. IVC:  nm*       [ <= 2.1]                FS:  17.4 %    [> 25]                    Min. IVC:  nm*               SWT:  0.81 cm   [0.5 - 0.9]                   ------------------               PWT:  0.79 cm   [0.5 - 0.9]                   nm* - not measured  LEFT ATRIUM           LA Diam:  3.8 cm    [2.7 - 3.8]       LA A4C Area:  nm*       [ < 20]         LA Volume:  nm*       [22 - 52]  ___________________________________________________________________________________________ ECHOCARDIOGRAPHIC DESCRIPTIONS  AORTIC ROOT         Size:Normal   Dissection:No dissection  AORTIC VALVE     Leaflets:Tricuspid             Morphology:Normal     Mobility:Fully mobile  LEFT VENTRICLE         Size:MILDLY ENLARGED         Anterior:HYPOCONTRACTILE  Contraction:MILD GLOBAL DECREASE     Lateral:HYPOCONTRACTILE   Closest EF:40% (Estimated)  Septal:HYPOCONTRACTILE    LV Masses:No Masses                 Apical:HYPOCONTRACTILE          [id number]                    Inferior:HYPOCONTRACTILE                                     Posterior:HYPOCONTRACTILE Dias.FxClass:Normal  MITRAL VALVE     Leaflets:Normal                  Mobility:Fully mobile   Morphology:Normal  LEFT ATRIUM         Size:MILDLY ENLARGED        LA Masses:No masses    IA Septum:Normal IAS  MAIN PA         Size:Normal  PULMONIC VALVE   Morphology:Normal                  Mobility:Fully mobile  RIGHT VENTRICLE    RV Masses:No Masses                   Size:Normal    Free Wall:Normal               Contraction:Normal  TRICUSPID VALVE     Leaflets:Normal                  Mobility:Fully mobile   Morphology:Normal  RIGHT ATRIUM         Size:MILDLY ENLARGED         RA Other:None      RA Mass:No masses  PERICARDIUM        Fluid:No effusion  INFERIOR VENACAVA         Size:Normal Normal respiratory collapse   ____________________________________________________________________ DOPPLER  ECHO and OTHER SPECIAL PROCEDURES    Aortic:TRIVIAL AR                    No AS           102.0 cm/sec peak vel         4.2 mmHg peak grad           3.0 mmHg mean grad            2.0 cm^2 by DOPPLER     Mitral:MODERATE MR                   No MS                                         2.3 cm^2 by DOPPLER           MV Inflow E Vel=54.0 cm/sec   MV Annulus E'Vel=7.5 cm/sec           E/E'Ratio=7.2  Tricuspid:MILD TR                       No TS           239.0 cm/sec peak TR vel      27.8 mmHg peak RV pressure  Pulmonary:TRIVIAL PR                    No PS     ___________________________________________________________________________________________ INTERPRETATION MILD LV  SYSTOLIC DYSFUNCTION WITH AN ESTIMATED EF = 40 % NORMAL RIGHT VENTRICULAR SYSTOLIC FUNCTION MODERATE MITRAL VALVE INSUFFICIENCY MILD TRICUSPID VALVE INSUFFICIENCY TRACE AORTIC VALVE INSUFFICIENCY NO VALVULAR STENOSIS MILD LV ENLARGEMENT NO EVIDENCE OF A SHUNT   ___________________________________________________________________________________________ Electronically signed by: MD Serafina Royals on 04/24/2014 11:56 AM             Performed By: Johnathan Hausen, [place], RVT       Ordering Physician: Serafina Royals  ___________________________________________________________________________________________   Status Results Details   Encounter Summary  AMBULATORY ECG ANALYSIS2/12/2014  [place] ECG ANALYSIS2/12/2014  [facility]  Result Narrative  Ordered by an unspecified provider.   Status Results Details   Encounter Summary

## 2015-07-17 ENCOUNTER — Ambulatory Visit: Payer: Medicare Other | Admitting: Registered Nurse

## 2015-07-17 ENCOUNTER — Ambulatory Visit
Admission: RE | Admit: 2015-07-17 | Discharge: 2015-07-17 | Disposition: A | Discharge status: Home / Self Care | Payer: Medicare Other | Source: Ambulatory Visit | Attending: General Surgery | Admitting: General Surgery

## 2015-07-17 ENCOUNTER — Encounter: Payer: Self-pay | Admitting: *Deleted

## 2015-07-17 ENCOUNTER — Encounter
Admission: RE | Disposition: A | Discharge status: Home / Self Care | Payer: Self-pay | Source: Ambulatory Visit | Attending: General Surgery

## 2015-07-17 DIAGNOSIS — I10 Essential (primary) hypertension: Secondary | ICD-10-CM | POA: Insufficient documentation

## 2015-07-17 DIAGNOSIS — K219 Gastro-esophageal reflux disease without esophagitis: Secondary | ICD-10-CM | POA: Insufficient documentation

## 2015-07-17 DIAGNOSIS — F17200 Nicotine dependence, unspecified, uncomplicated: Secondary | ICD-10-CM | POA: Insufficient documentation

## 2015-07-17 DIAGNOSIS — E119 Type 2 diabetes mellitus without complications: Secondary | ICD-10-CM | POA: Diagnosis not present

## 2015-07-17 DIAGNOSIS — C774 Secondary and unspecified malignant neoplasm of inguinal and lower limb lymph nodes: Secondary | ICD-10-CM | POA: Insufficient documentation

## 2015-07-17 DIAGNOSIS — I509 Heart failure, unspecified: Secondary | ICD-10-CM | POA: Diagnosis not present

## 2015-07-17 DIAGNOSIS — E039 Hypothyroidism, unspecified: Secondary | ICD-10-CM | POA: Insufficient documentation

## 2015-07-17 HISTORY — PX: INGUINAL LYMPH NODE BIOPSY: SHX5865

## 2015-07-17 SURGERY — BIOPSY, LYMPH NODE, INGUINAL, OPEN
Anesthesia: Monitor Anesthesia Care | Laterality: Right | Wound class: Clean

## 2015-07-17 MED ORDER — MIDAZOLAM HCL 2 MG/2ML IJ SOLN
INTRAMUSCULAR | Status: DC | PRN
  Administered 2015-07-17: 2 mg via INTRAVENOUS

## 2015-07-17 MED ORDER — PHENYLEPHRINE HCL 10 MG/ML IJ SOLN
INTRAMUSCULAR | Status: DC | PRN
  Administered 2015-07-17 (×2): 100 ug via INTRAVENOUS

## 2015-07-17 MED ORDER — FENTANYL CITRATE (PF) 100 MCG/2ML IJ SOLN
INTRAMUSCULAR | Status: DC | PRN
  Administered 2015-07-17: 50 ug via INTRAVENOUS

## 2015-07-17 MED ORDER — LIDOCAINE HCL (CARDIAC) 20 MG/ML IV SOLN
INTRAVENOUS | Status: DC | PRN
  Administered 2015-07-17: 80 mg via INTRAVENOUS

## 2015-07-17 MED ORDER — LIDOCAINE-EPINEPHRINE (PF) 1 %-1:200000 IJ SOLN
INTRAMUSCULAR | Status: AC
  Filled 2015-07-17: qty 30

## 2015-07-17 MED ORDER — FENTANYL CITRATE (PF) 100 MCG/2ML IJ SOLN: 25.0000 ug | INTRAMUSCULAR | Status: DC | PRN

## 2015-07-17 MED ORDER — LIDOCAINE-EPINEPHRINE (PF) 1 %-1:200000 IJ SOLN
INTRAMUSCULAR | Status: DC | PRN
  Administered 2015-07-17: 15 mL

## 2015-07-17 MED ORDER — BUPIVACAINE-EPINEPHRINE (PF) 0.5% -1:200000 IJ SOLN
INTRAMUSCULAR | Status: DC | PRN
Start: 2015-07-17 — End: 2015-07-17
  Administered 2015-07-17: 15 mL

## 2015-07-17 MED ORDER — GLYCOPYRROLATE 0.2 MG/ML IJ SOLN
INTRAMUSCULAR | Status: DC | PRN
  Administered 2015-07-17: 0.2 mg via INTRAVENOUS

## 2015-07-17 MED ORDER — HYDROCODONE-ACETAMINOPHEN 5-325 MG PO TABS: 1.0000 | ORAL_TABLET | ORAL | Status: DC | PRN

## 2015-07-17 MED ORDER — ONDANSETRON HCL 4 MG/2ML IJ SOLN: 4.0000 mg | Freq: Once | INTRAMUSCULAR | Status: DC | PRN

## 2015-07-17 MED ORDER — BUPIVACAINE-EPINEPHRINE (PF) 0.5% -1:200000 IJ SOLN
INTRAMUSCULAR | Status: AC
  Filled 2015-07-17: qty 30

## 2015-07-17 MED ORDER — PROPOFOL 500 MG/50ML IV EMUL
INTRAVENOUS | Status: DC | PRN
  Administered 2015-07-17: 150 ug/kg/min via INTRAVENOUS

## 2015-07-17 MED ORDER — FAMOTIDINE 20 MG PO TABS
20.0000 mg | ORAL_TABLET | Freq: Once | ORAL | Status: AC
  Administered 2015-07-17: 20 mg via ORAL

## 2015-07-17 MED ORDER — FAMOTIDINE 20 MG PO TABS
ORAL_TABLET | ORAL | Status: AC
  Filled 2015-07-17: qty 1

## 2015-07-17 MED ORDER — LACTATED RINGERS IV SOLN
INTRAVENOUS | Status: DC
  Administered 2015-07-17: 10:00:00 via INTRAVENOUS

## 2015-07-17 SURGICAL SUPPLY — 25 items
BLADE SURG 15 STRL SS SAFETY (BLADE) ×2 IMPLANT
CANISTER SUCT 1200ML W/VALVE (MISCELLANEOUS) ×2 IMPLANT
CHLORAPREP W/TINT 26ML (MISCELLANEOUS) ×2 IMPLANT
CNTNR SPEC 2.5X3XGRAD LEK (MISCELLANEOUS)
CONT SPEC 4OZ STER OR WHT (MISCELLANEOUS)
CONTAINER SPEC 2.5X3XGRAD LEK (MISCELLANEOUS) IMPLANT
DRAPE LAPAROTOMY 100X77 ABD (DRAPES) ×2 IMPLANT
DRESSING TELFA 4X3 1S ST N-ADH (GAUZE/BANDAGES/DRESSINGS) ×2 IMPLANT
DRSG TEGADERM 4X4.75 (GAUZE/BANDAGES/DRESSINGS) ×2 IMPLANT
ELECT REM PT RETURN 9FT ADLT (ELECTROSURGICAL) ×2
ELECTRODE REM PT RTRN 9FT ADLT (ELECTROSURGICAL) ×1 IMPLANT
GLOVE BIO SURGEON STRL SZ7.5 (GLOVE) ×6 IMPLANT
GLOVE BIOGEL PI IND STRL 8 (GLOVE) ×2 IMPLANT
GLOVE BIOGEL PI INDICATOR 8 (GLOVE) ×2
GOWN STRL REUS W/ TWL LRG LVL3 (GOWN DISPOSABLE) ×2 IMPLANT
GOWN STRL REUS W/TWL LRG LVL3 (GOWN DISPOSABLE) ×2
NDL SAFETY 25GX1.5 (NEEDLE) ×2 IMPLANT
STRIP CLOSURE SKIN 1/2X4 (GAUZE/BANDAGES/DRESSINGS) ×2 IMPLANT
SUT VIC AB 2-0 CT1 (SUTURE) IMPLANT
SUT VIC AB 3-0 SH 27 (SUTURE) ×1
SUT VIC AB 3-0 SH 27X BRD (SUTURE) ×1 IMPLANT
SUT VIC AB 4-0 FS2 27 (SUTURE) ×2 IMPLANT
SUT VICRYL+ 3-0 144IN (SUTURE) ×2 IMPLANT
SWABSTK COMLB BENZOIN TINCTURE (MISCELLANEOUS) ×2 IMPLANT
SYR CONTROL 10ML (SYRINGE) ×2 IMPLANT

## 2015-07-17 NOTE — Anesthesia Preprocedure Evaluation (Signed)
Anesthesia Evaluation  Patient identified by MRN, date of birth, ID band Patient awake    Reviewed: Allergy & Precautions, H&P , NPO status , Patient's Chart, lab work & pertinent test results, reviewed documented beta blocker date and time   Airway Mallampati: II  TM Distance: >3 FB Neck ROM: full    Dental no notable dental hx. (+) Teeth Intact   Pulmonary neg pulmonary ROS, Current Smoker,    Pulmonary exam normal breath sounds clear to auscultation       Cardiovascular Exercise Tolerance: Good hypertension, (-) angina+CHF  (-) Orthopnea, (-) PND and (-) DOE negative cardio ROS   Rhythm:regular Rate:Normal     Neuro/Psych  Headaches, PSYCHIATRIC DISORDERS negative neurological ROS  negative psych ROS   GI/Hepatic negative GI ROS, Neg liver ROS, GERD  Medicated,  Endo/Other  negative endocrine ROSdiabetesHypothyroidism   Renal/GU Renal disease     Musculoskeletal   Abdominal   Peds  Hematology negative hematology ROS (+)   Anesthesia Other Findings   Reproductive/Obstetrics negative OB ROS                             Anesthesia Physical Anesthesia Plan  ASA: III  Anesthesia Plan: MAC   Post-op Pain Management:    Induction:   Airway Management Planned:   Additional Equipment:   Intra-op Plan:   Post-operative Plan:   Informed Consent: I have reviewed the patients History and Physical, chart, labs and discussed the procedure including the risks, benefits and alternatives for the proposed anesthesia with the patient or authorized representative who has indicated his/her understanding and acceptance.     Plan Discussed with: CRNA  Anesthesia Plan Comments:         Anesthesia Quick Evaluation

## 2015-07-17 NOTE — Transfer of Care (Signed)
Immediate Anesthesia Transfer of Care Note  Patient: Patricia Li  Procedure(s) Performed: Procedure(s): INGUINAL LYMPH NODE BIOPSY/EXCISION (Right)  Patient Location: PACU  Anesthesia Type:General  Level of Consciousness: sedated  Airway & Oxygen Therapy: Patient Spontanous Breathing and Patient connected to face mask oxygen  Post-op Assessment: Report given to RN and Post -op Vital signs reviewed and stable  Post vital signs: Reviewed and stable  Last Vitals:  Filed Vitals:   07/17/15 0925 07/17/15 1051  BP: 114/82 97/67  Pulse: 100 76  Temp: 36.7 C 36.4 C  Resp: 20 13    Complications: No apparent anesthesia complications

## 2015-07-17 NOTE — Anesthesia Procedure Notes (Signed)
Date/Time: 07/17/2015 10:08 AM Performed by: Doreen Salvage Pre-anesthesia Checklist: Patient identified, Emergency Drugs available, Suction available and Patient being monitored Patient Re-evaluated:Patient Re-evaluated prior to inductionOxygen Delivery Method: Simple face mask Intubation Type: IV induction Dental Injury: Teeth and Oropharynx as per pre-operative assessment

## 2015-07-17 NOTE — Anesthesia Postprocedure Evaluation (Signed)
Anesthesia Post Note  Patient: Patricia Li  Procedure(s) Performed: Procedure(s) (LRB): INGUINAL LYMPH NODE BIOPSY/EXCISION (Right)  Patient location during evaluation: PACU Anesthesia Type: General Level of consciousness: awake and alert Pain management: pain level controlled Vital Signs Assessment: post-procedure vital signs reviewed and stable Respiratory status: spontaneous breathing, nonlabored ventilation, respiratory function stable and patient connected to nasal cannula oxygen Cardiovascular status: blood pressure returned to baseline and stable Postop Assessment: no signs of nausea or vomiting Anesthetic complications: no    Last Vitals:  Filed Vitals:   07/17/15 1139 07/17/15 1156  BP: 123/54 119/70  Pulse: 67 68  Temp: 37 C   Resp: 16 16    Last Pain: There were no vitals filed for this visit.               Molli Barrows

## 2015-07-17 NOTE — Discharge Instructions (Signed)
AMBULATORY SURGERY  °DISCHARGE INSTRUCTIONS ° ° °1) The drugs that you were given will stay in your system until tomorrow so for the next 24 hours you should not: ° °A) Drive an automobile °B) Make any legal decisions °C) Drink any alcoholic beverage ° ° °2) You may resume regular meals tomorrow.  Today it is better to start with liquids and gradually work up to solid foods. ° °You may eat anything you prefer, but it is better to start with liquids, then soup and crackers, and gradually work up to solid foods. ° ° °3) Please notify your doctor immediately if you have any unusual bleeding, trouble breathing, redness and pain at the surgery site, drainage, fever, or pain not relieved by medication. ° ° ° °4) Additional Instructions: ° ° ° ° ° ° ° °Please contact your physician with any problems or Same Day Surgery at [phone], Monday through Friday 6 am to 4 pm, or [facility] Main number at [phone]. °

## 2015-07-17 NOTE — H&P (Signed)
No change in clinical condition or exam.  For right superficial inguinal node excision.

## 2015-07-17 NOTE — Op Note (Signed)
Preoperative diagnosis: Recurrent melanoma.  Postoperative diagnosis: Same.  Operative procedure: Excision right superficial inguinal lymph node.  Operating surgeon: Ollen Bowl, M.D.  Anesthesia: Monitored anesthesia care, 30 mL 0.5% Xylocaine with 0.25% Marcaine with 1-200,000 of epinephrine.  Estimated blood loss: 5 mL.  Clinical note: This 55 year old woman has had trouble episodes of recurrent melanoma involving the right lower extremity. The most recent episode presented with enlargement of a right external iliac node. PET scan showed no other areas of increased activity. Formal excision was requested by her treating oncologist. She was not felt to be a candidate for formal groin dissection.  Operative note: The patient was sedated and the area prepped with ChloraPrep and draped. Local anesthetic was infiltrated as well as a field block for postoperative and intraoperative analgesia. A skin line incision over the mass was made and carried aphthous consulting this tissue with hemostasis achieved by electrocautery and 3-0 Vicryl ties. A branch of the inferior epigastric vessel was ligated and the mass excised. This was sent in formalin for routine histology. There appeared to be a large metastatic deposit with focal necrosis as well as a small remnant of normal node.  The wound was closed in multiple layers of 3-0 Vicryl figure-of-eight and running sutures. The skin was closed with a running 4-0 Vicryl septic suture. Benzoin, Steri-Strips, Telfa and Tegaderm dressings were applied.  The patient tolerated the procedure well and was taken to recovery in stable condition.

## 2015-07-20 ENCOUNTER — Inpatient Hospital Stay: Payer: Medicare Other | Attending: Family Medicine | Admitting: Family Medicine

## 2015-07-20 ENCOUNTER — Encounter: Payer: Self-pay | Admitting: Family Medicine

## 2015-07-20 ENCOUNTER — Telehealth: Payer: Self-pay | Admitting: *Deleted

## 2015-07-20 ENCOUNTER — Other Ambulatory Visit: Payer: Self-pay | Admitting: Family Medicine

## 2015-07-20 VITALS — BP 129/90 | HR 87 | Temp 98.4°F | Wt 168.7 lb

## 2015-07-20 DIAGNOSIS — W57XXXA Bitten or stung by nonvenomous insect and other nonvenomous arthropods, initial encounter: Secondary | ICD-10-CM | POA: Diagnosis not present

## 2015-07-20 DIAGNOSIS — C774 Secondary and unspecified malignant neoplasm of inguinal and lower limb lymph nodes: Secondary | ICD-10-CM | POA: Insufficient documentation

## 2015-07-20 DIAGNOSIS — R599 Enlarged lymph nodes, unspecified: Secondary | ICD-10-CM | POA: Insufficient documentation

## 2015-07-20 DIAGNOSIS — M5186 Other intervertebral disc disorders, lumbar region: Secondary | ICD-10-CM | POA: Diagnosis not present

## 2015-07-20 DIAGNOSIS — K219 Gastro-esophageal reflux disease without esophagitis: Secondary | ICD-10-CM

## 2015-07-20 DIAGNOSIS — F17210 Nicotine dependence, cigarettes, uncomplicated: Secondary | ICD-10-CM | POA: Insufficient documentation

## 2015-07-20 DIAGNOSIS — Z800 Family history of malignant neoplasm of digestive organs: Secondary | ICD-10-CM | POA: Diagnosis not present

## 2015-07-20 DIAGNOSIS — Z79899 Other long term (current) drug therapy: Secondary | ICD-10-CM | POA: Diagnosis not present

## 2015-07-20 DIAGNOSIS — Z9221 Personal history of antineoplastic chemotherapy: Secondary | ICD-10-CM | POA: Diagnosis not present

## 2015-07-20 DIAGNOSIS — I509 Heart failure, unspecified: Secondary | ICD-10-CM | POA: Insufficient documentation

## 2015-07-20 DIAGNOSIS — C499 Malignant neoplasm of connective and soft tissue, unspecified: Secondary | ICD-10-CM | POA: Insufficient documentation

## 2015-07-20 DIAGNOSIS — S0096XA Insect bite (nonvenomous) of unspecified part of head, initial encounter: Secondary | ICD-10-CM | POA: Diagnosis not present

## 2015-07-20 HISTORY — DX: Insect bite (nonvenomous) of unspecified part of head, initial encounter: W57.XXXA

## 2015-07-20 HISTORY — DX: Insect bite (nonvenomous) of unspecified part of head, initial encounter: S00.96XA

## 2015-07-20 MED ORDER — DOXYCYCLINE HYCLATE 100 MG PO TABS: 100.0000 mg | ORAL_TABLET | Freq: Two times a day (BID) | ORAL | Status: DC

## 2015-07-20 NOTE — Telephone Encounter (Signed)
Per Magda Paganini, need to see her. Agrees to 1100 appt today

## 2015-07-20 NOTE — Telephone Encounter (Signed)
Called to report that she had a groin lymph nodes removed from her groin Friday and over the weekend she developed swollen nodes down the right side of her head and down her neck which are very painful. Please advise

## 2015-07-20 NOTE — Progress Notes (Signed)
[facility]  [phone]  [phone]     Patricia Li DOB: Aug 10, 1960  MR#: [id number]  [id number]  Patient Care Team: Donnie Mesa, MD as PCP - General (Family Medicine) Robert Bellow, MD as Consulting Physician (General Surgery) Forest Gleason, MD (Oncology)  CHIEF COMPLAINT:  Chief Complaint  Patient presents with  . enlarged lymph nodes  . Melanoma  . Acute Visit    INTERVAL HISTORY:  Patient is here as an acute add on regarding multiple enlarged and painful lymph nodes that began on Saturday. Patient has a history of melanoma involving the right lower extremity. She recently had a PET scan that showed recurrence in the right inguinal lymph node. On Friday, May 19 patient underwent excision of a right superficial inguinal lymph node with Dr. Bary Castilla. She states that incision feels fine but on Saturday night she developed tenderness in her neck and by Sunday morning and she had multiple hard, enlarged, and painful lymph nodes in the right cervical region and in her scalp on the right side.  REVIEW OF SYSTEMS:   Review of Systems  Constitutional: Negative for fever, chills, weight loss, malaise/fatigue and diaphoresis.  HENT:       Multiple painful enlarged lymph nodes in the right cervical region as well as along the scalp. Had a tick approximately 2 weeks ago in same area of scalp  Eyes: Negative.   Respiratory: Negative for cough, hemoptysis, sputum production, shortness of breath and wheezing.   Cardiovascular: Negative for chest pain, palpitations, orthopnea, claudication, leg swelling and PND.  Gastrointestinal: Negative for heartburn, nausea, vomiting, abdominal pain, diarrhea, constipation, blood in stool and melena.  Genitourinary: Negative.   Musculoskeletal: Negative.   Skin: Negative.   Neurological: Negative for dizziness, tingling, focal weakness, seizures and weakness.  Endo/Heme/Allergies: Does not bruise/bleed  easily.  Psychiatric/Behavioral: Negative for depression. The patient is not nervous/anxious and does not have insomnia.     As per HPI. Otherwise, a complete review of systems is negatve.  ONCOLOGY HISTORY: 1. 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 TX, Nx, M1 tumor, stage IV. 2. 4 weeks of high in the Intron therapy high dose in February of 2009. 3. Low-dose interferon started from March of 2009 4. 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 Biopsy of right thigh mass was positive for melanoma. August of 2012 5. B-RAF not mutated  6. Started on [facility] October, 2012 7. Chemotherapy after 2 cycles of treatment because of side effect 8. Vitamin B 12 deficiency 9.Recent PET scan shows recurrent disease with RIGHT INGUINAL adenopathy (April, 2017)  PAST MEDICAL HISTORY: Past Medical History  Diagnosis Date  . Malignant melanoma of skin of lower limb, including hip ([place]) 2008    right thigh  . Lumbar herniated disc 2010  . Personal history of malignant melanoma of skin 2012  . GERD (gastroesophageal reflux disease) 2012  . CHF (congestive heart failure) ([place]) 2016    Dr. Nehemiah Massed  . Tick bite of head 07/20/2015    PAST SURGICAL HISTORY: Past Surgical History  Procedure Laterality Date  . Melanoma excision  2008    right thigh  . Back surgery  2010  . Skin lesion excision Right 12-19-10    posterior  . Colonoscopy  2013    [facility]  . Fracture surgery      rod removed from left femur 07/21/2014  . Breast cyst excision  1985  . Inguinal lymph node biopsy Right 07/17/2015  Procedure: INGUINAL LYMPH NODE BIOPSY/EXCISION;  Surgeon: Robert Bellow, MD;  Location: [facility] ORS;  Service: General;  Laterality: Right;    FAMILY HISTORY Family History  Problem Relation Age of Onset  . COPD Father   . Cancer Sister     Colon, sister    GYNECOLOGIC HISTORY:  Patient's last menstrual period was 02/28/1997 (approximate).      ADVANCED DIRECTIVES:    HEALTH MAINTENANCE: Social History  Substance Use Topics  . Smoking status: Current Every Day Smoker -- 0.50 packs/day for 20 years    Types: Cigarettes  . Smokeless tobacco: Never Used  . Alcohol Use: 0.0 oz/week    0 Standard drinks or equivalent per week     Comment: ocassionally      Allergies  Allergen Reactions  . Clindamycin Shortness Of Breath  . Aripiprazole Other (See Comments)    Facial and head ticks  . Valproic Acid     Other reaction(s): Other (See Comments) Facial twitching    Current Outpatient Prescriptions  Medication Sig Dispense Refill  . calcium carbonate (OS-CAL) 600 MG TABS tablet Take 600 mg by mouth 2 (two) times daily with a meal.    . furosemide (LASIX) 20 MG tablet Take 20 mg by mouth every other day.     . hydrocortisone (CORTEF) 10 MG tablet Take 25 mg by mouth as directed.     Marland Kitchen levothyroxine (SYNTHROID, LEVOTHROID) 112 MCG tablet     . metoprolol tartrate (LOPRESSOR) 25 MG tablet Take 25 mg by mouth once.    . Multiple Vitamin (MULTI-VITAMINS) TABS Take by mouth.    . pantoprazole (PROTONIX) 40 MG tablet Take 40 mg by mouth 2 (two) times daily.     Marland Kitchen venlafaxine XR (EFFEXOR-XR) 150 MG 24 hr capsule 150 mg every morning.     Marland Kitchen doxycycline (VIBRA-TABS) 100 MG tablet Take 1 tablet (100 mg total) by mouth 2 (two) times daily. 28 tablet 0  . HYDROcodone-acetaminophen (NORCO) 5-325 MG tablet Take 1-2 tablets by mouth every 4 (four) hours as needed for moderate pain. (Patient not taking: Reported on 07/20/2015) 30 tablet 0   No current facility-administered medications for this visit.    OBJECTIVE: BP 129/90 mmHg  Pulse 87  Temp(Src) 98.4 F (36.9 C) (Tympanic)  Wt 168 lb 10.4 oz (76.5 kg)  LMP 02/28/1997 (Approximate)   Body mass index is 26.41 kg/(m^2).    ECOG FS:1 - Symptomatic but completely ambulatory  General: Well-developed, well-nourished, no acute distress. Eyes: Pink conjunctiva, anicteric  sclera. HEENT: Normocephalic, moist mucous membranes, clear oropharnyx. Lungs: Clear to auscultation bilaterally. Heart: Regular rate and rhythm. No rubs, murmurs, or gallops. Abdomen: Soft, nontender, nondistended. No organomegaly noted, normoactive bowel sounds. Musculoskeletal: No edema, cyanosis, or clubbing. Neuro: Alert, answering all questions appropriately. Cranial nerves grossly intact. Skin: Erythemic circular area with scab in place and occipital portion of the scalp  Psych: Normal affect. Lymphatics: enlarged, extremely tender right cervical, occipital, posterior auricular, and preauricular lymph nodes.  LAB RESULTS:  No visits with results within 3 Day(s) from this visit. Latest known visit with results is:  Hospital Outpatient Visit on 06/22/2015  Component Date Value Ref Range Status  . Glucose-Capillary 06/22/2015 90  65 - 99 mg/dL Final    STUDIES: No results found.  ASSESSMENT:  Melanoma involving right lower extremity.  Enlarged right cervical lymph nodes.   PLAN:   1. Melanoma. Patient recently noted to have hypermetabolic uptake in the right inguinal area on PET scan.  She underwent excision of the right superficial inguinal lymph node on Friday, 07/17/2015. Surgical incision site is covered with bandage there is no heat, erythema or drainage.  2. Enlarged right cervical lymph nodes. Patient reports finding a tick on her scalp approximately 2 weeks ago in the same general vicinity. She developed some discomfort in the neck on Saturday evening which progressed into Sunday with a large tender lymph nodes causing inability to move her head.  She is evaluated today by myself as well as Dr. Rogue Bussing. Enlarged lymph nodes most likely related to recent tick bite. We will start patient on doxycycline 100 mg twice daily for at least 2 weeks. Patient has been instructed to take Tylenol for management of discomfort. Instructed that if she does not see any improvement in  the next several days she is to contact our office prior to the weekend for further evaluation.  Will return to clinic in 2 weeks for further evaluation.  Patient expressed understanding and was in agreement with this plan. She also understands that She can call clinic at any time with any questions, concerns, or complaints.   Dr. Rogue Bussing available for consultation and review of plan of care for this patient.   Evlyn Kanner, NP   07/20/2015 3:34 PM

## 2015-07-30 ENCOUNTER — Ambulatory Visit (INDEPENDENT_AMBULATORY_CARE_PROVIDER_SITE_OTHER): Payer: Self-pay | Admitting: General Surgery

## 2015-07-30 ENCOUNTER — Encounter: Payer: Self-pay | Admitting: General Surgery

## 2015-07-30 VITALS — BP 120/70 | HR 64 | Resp 16 | Ht 67.0 in | Wt 164.0 lb

## 2015-07-30 DIAGNOSIS — C4371 Malignant melanoma of right lower limb, including hip: Secondary | ICD-10-CM

## 2015-07-30 NOTE — Progress Notes (Signed)
Patient ID: Patricia Li, female   DOB: 27-Oct-1960, 55 y.o.   MRN: [id number]  Chief Complaint  Patient presents with  . Routine Post Op    HPI Patricia Li is a 55 y.o. female.  Here today for postoperative visit, excision lymph node, metastatic melanoma on 07-17-15. She states she feels a hard knot under the incision but no pain. She does admit to her chest sweating at night which started 3-4 days after surgery. Seh did have a tick bite which caused some head and neck node swelling after surgery 07-20-15 and is still on doxycycline.  The patient expressed a desire to be under the care of Dr. Burlene Arnt after Dr. Metro Kung retirement.  HPI  Past Medical History  Diagnosis Date  . Lumbar herniated disc 2010  . Personal history of malignant melanoma of skin 2012  . GERD (gastroesophageal reflux disease) 2012  . CHF (congestive heart failure) ([place]) 2016    Dr. Nehemiah Massed  . Tick bite of head 07/20/2015  . Malignant melanoma of skin of lower limb, including hip ([place]) 2008, 2017    right thigh    Past Surgical History  Procedure Laterality Date  . Melanoma excision  2008    right thigh  . Back surgery  2010  . Skin lesion excision Right 12-19-10    posterior  . Colonoscopy  2013    [facility]  . Fracture surgery      rod removed from left femur 07/21/2014  . Breast cyst excision  1985  . Inguinal lymph node biopsy Right 07/17/2015    Procedure: INGUINAL LYMPH NODE BIOPSY/EXCISION;  Surgeon: Robert Bellow, MD;  Location: [facility] ORS;  Service: General;  Laterality: Right;    Family History  Problem Relation Age of Onset  . COPD Father   . Cancer Sister     Colon, sister    Social History Social History  Substance Use Topics  . Smoking status: Current Every Day Smoker -- 0.50 packs/day for 20 years    Types: Cigarettes  . Smokeless tobacco: Never Used  . Alcohol Use: 0.0 oz/week    0 Standard drinks or equivalent per week     Comment: ocassionally    Allergies   Allergen Reactions  . Clindamycin Shortness Of Breath  . Aripiprazole Other (See Comments)    Facial and head ticks  . Valproic Acid     Other reaction(s): Other (See Comments) Facial twitching    Current Outpatient Prescriptions  Medication Sig Dispense Refill  . calcium carbonate (OS-CAL) 600 MG TABS tablet Take 600 mg by mouth 2 (two) times daily with a meal.    . doxycycline (VIBRA-TABS) 100 MG tablet Take 1 tablet (100 mg total) by mouth 2 (two) times daily. 28 tablet 0  . furosemide (LASIX) 20 MG tablet Take 20 mg by mouth every other day.     . hydrocortisone (CORTEF) 10 MG tablet Take 25 mg by mouth as directed.     Marland Kitchen levothyroxine (SYNTHROID, LEVOTHROID) 112 MCG tablet     . metoprolol tartrate (LOPRESSOR) 25 MG tablet Take 25 mg by mouth once.    . Multiple Vitamin (MULTI-VITAMINS) TABS Take by mouth.    . pantoprazole (PROTONIX) 40 MG tablet Take 40 mg by mouth 2 (two) times daily.     Marland Kitchen venlafaxine XR (EFFEXOR-XR) 150 MG 24 hr capsule 150 mg every morning.      No current facility-administered medications for this visit.    Review of Systems Review  of Systems  Respiratory: Negative.   Cardiovascular: Negative.     Blood pressure 120/70, pulse 64, resp. rate 16, height 5\' 7"  (1.702 m), weight 164 lb (74.39 kg).  Physical Exam Physical Exam  Constitutional: She is oriented to person, place, and time. She appears well-developed and well-nourished.  Eyes: Conjunctivae are normal. No scleral icterus.  Neck: Neck supple.  Lymphadenopathy:    She has no cervical adenopathy.  Neurological: She is alert and oriented to person, place, and time.  Skin: Skin is warm and dry.  Right groin incision well healed.   Psychiatric: Her behavior is normal.    Data Reviewed Metastatic melanoma identified on the enlarged lymph node from the right groin. Necrosis present.  Assessment    Doing well status post right inguinal node biopsy.    Plan    The patient was  reassured that the residual healing ridge will resolve. Follow-up will be with Dr. Oliva Bustard in the near future.     Continue to use heating pad to the area for comfort. Follow up with [place] as scheduled. Follow up in one month.   PCP:  [id number] This information has been scribed by Karie Fetch RN, BSN,BC.   Robert Bellow 07/31/2015, 12:30 PM

## 2015-07-30 NOTE — Patient Instructions (Addendum)
The patient is aware to call back for any questions or concerns. Continue to use heating pad to the area for comfort. Follow up with [place] as scheduled.

## 2015-08-03 ENCOUNTER — Ambulatory Visit: Payer: Self-pay | Admitting: Family Medicine

## 2015-08-03 ENCOUNTER — Other Ambulatory Visit: Payer: Self-pay

## 2015-08-04 ENCOUNTER — Encounter: Payer: Self-pay | Admitting: Oncology

## 2015-08-04 ENCOUNTER — Inpatient Hospital Stay: Payer: Medicare Other | Attending: Oncology | Admitting: Oncology

## 2015-08-04 VITALS — BP 120/61 | HR 73 | Temp 96.9°F | Wt 164.5 lb

## 2015-08-04 DIAGNOSIS — C801 Malignant (primary) neoplasm, unspecified: Secondary | ICD-10-CM

## 2015-08-04 DIAGNOSIS — R531 Weakness: Secondary | ICD-10-CM | POA: Diagnosis not present

## 2015-08-04 DIAGNOSIS — M5126 Other intervertebral disc displacement, lumbar region: Secondary | ICD-10-CM | POA: Diagnosis not present

## 2015-08-04 DIAGNOSIS — Z87891 Personal history of nicotine dependence: Secondary | ICD-10-CM | POA: Diagnosis not present

## 2015-08-04 DIAGNOSIS — F17210 Nicotine dependence, cigarettes, uncomplicated: Secondary | ICD-10-CM | POA: Diagnosis not present

## 2015-08-04 DIAGNOSIS — Z85828 Personal history of other malignant neoplasm of skin: Secondary | ICD-10-CM | POA: Diagnosis not present

## 2015-08-04 DIAGNOSIS — K219 Gastro-esophageal reflux disease without esophagitis: Secondary | ICD-10-CM | POA: Diagnosis not present

## 2015-08-04 DIAGNOSIS — Z79899 Other long term (current) drug therapy: Secondary | ICD-10-CM | POA: Diagnosis not present

## 2015-08-04 DIAGNOSIS — R5383 Other fatigue: Secondary | ICD-10-CM | POA: Diagnosis not present

## 2015-08-04 DIAGNOSIS — R609 Edema, unspecified: Secondary | ICD-10-CM

## 2015-08-04 DIAGNOSIS — Z9221 Personal history of antineoplastic chemotherapy: Secondary | ICD-10-CM | POA: Diagnosis not present

## 2015-08-04 DIAGNOSIS — F329 Major depressive disorder, single episode, unspecified: Secondary | ICD-10-CM | POA: Diagnosis not present

## 2015-08-04 DIAGNOSIS — C439 Malignant melanoma of skin, unspecified: Secondary | ICD-10-CM

## 2015-08-04 DIAGNOSIS — I509 Heart failure, unspecified: Secondary | ICD-10-CM

## 2015-08-04 DIAGNOSIS — R630 Anorexia: Secondary | ICD-10-CM

## 2015-08-04 DIAGNOSIS — E230 Hypopituitarism: Secondary | ICD-10-CM | POA: Insufficient documentation

## 2015-08-04 DIAGNOSIS — R251 Tremor, unspecified: Secondary | ICD-10-CM | POA: Diagnosis not present

## 2015-08-04 DIAGNOSIS — R634 Abnormal weight loss: Secondary | ICD-10-CM | POA: Diagnosis not present

## 2015-08-04 DIAGNOSIS — E538 Deficiency of other specified B group vitamins: Secondary | ICD-10-CM | POA: Insufficient documentation

## 2015-08-04 DIAGNOSIS — C799 Secondary malignant neoplasm of unspecified site: Secondary | ICD-10-CM | POA: Diagnosis not present

## 2015-08-04 NOTE — Progress Notes (Signed)
[place] @ [facility] [phone]  Fax:([phone]) [place]: 1960/10/15  MR#: [id number]  [id number]  Patient Care Team: Donnie Mesa, MD as PCP - General (Family Medicine) Robert Bellow, MD as Consulting Physician (General Surgery) Forest Gleason, MD (Oncology)  CHIEF COMPLAINT:  Chief Complaint  Patient presents with  . Melanoma    Chief Complaint/Diagnosis:   1. 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 TX, Nx, M1 tumor, stage IV. 2. 4 weeks of high in the Intron therapy high dose in February of 2009. 3. Low-dose interferon started from March of 2009 4. 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 Biopsy of right thigh mass was positive  for melanoma. August of 2012 5. B-RAF not mutated   6. Started on  [facility] October, 2012 7. Chemotherapy after 2 cycles of treatment because of side effect 8. Vitamin B 12 deficiency  9.Recent PET scan shows recurrent disease with   RIGHT INGUINAL adenopathy (April, 2017).  10.  She underwent excisional biopsy of t  Right inguinal lymph node which is consistent with melanoma INTERVAL HISTORY:  55 year old lady came today complaining of RIGHT INGUINAL    mass which she has noticed for weeks ago.  Not painful.  Patient had a previous history of weight loss appetite is poor.  No nausea no vomiting no diarrhea no difficulty passing urine patient is here for ongoing evaluation and treatment consideration Patient remains asymptomatic.  No cough no shortness of breath. Right inguinal excisional biopsy consistent with melanoma  REVIEW OF SYSTEMS:   Gen. status: Patient is somewhat feeling weak and tired.  No chills or fever Lungs: No cough or shortness of breath GI: No nausea no vomiting as poor appetite has lost weight. GU: No dysuria or hematuria. Skin: No rash.  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 Otherwise, a complete review of systems is negatve.  PAST MEDICAL HISTORY: Past Medical History  Diagnosis Date  . Lumbar herniated disc 2010  . Personal history of malignant melanoma of skin 2012  . GERD (gastroesophageal reflux disease) 2012  . CHF (congestive heart failure) ([place]) 2016    Dr. Nehemiah Massed  . Tick bite of head 07/20/2015  . Malignant melanoma of skin of lower limb, including hip ([place]) 2008, 2017    right thigh    PAST SURGICAL HISTORY: Past Surgical History  Procedure Laterality Date  . Melanoma excision  2008    right thigh  . Back surgery  2010  . Skin lesion excision Right 12-19-10    posterior  . Colonoscopy  2013    [facility]  . Fracture surgery      rod removed from left femur 07/21/2014  . Breast cyst excision  1985  . Inguinal lymph node biopsy Right 07/17/2015    Procedure: INGUINAL LYMPH NODE BIOPSY/EXCISION;  Surgeon: Robert Bellow, MD;  Location: [facility] ORS;  Service: General;  Laterality: Right;  Allergies:  No Known Allergies:   Significant History/PMH:   acid reflux:    edema:    tremors to hands from psych meds(seroquel):    Depression:    melanoma:    Melanoma right thigh:    microdiskectomy L4-L5: 2010   Lumpectomy: 1987  Preventive Screening:  Has patient had any of the following test? Colonscopy  Pap Smear (  1)   Last Colonoscopy: 2014(1)   Last Pap Smear: 09/2010(1)    FAMILY HISTORY There is no significant family history of breast cancer, ovarian cancer, colon cancer ADVANCED DIRECTIVES:  Patient does not have any living will or healthcare power of attorney.  Information was given .  Available resources had been discussed.  We will follow-up on subsequent appointments regarding this issue HEALTH MAINTENANCE: Social History  Substance Use Topics  . Smoking status: Current Every Day Smoker  -- 0.50 packs/day for 20 years    Types: Cigarettes  . Smokeless tobacco: Never Used  . Alcohol Use: 0.0 oz/week    0 Standard drinks or equivalent per week     Comment: ocassionally      Allergies  Allergen Reactions  . Clindamycin Shortness Of Breath  . Aripiprazole Other (See Comments)    Facial and head ticks  . Valproic Acid     Other reaction(s): Other (See Comments) Facial twitching    Current Outpatient Prescriptions  Medication Sig Dispense Refill  . calcium carbonate (OS-CAL) 600 MG TABS tablet Take 600 mg by mouth 2 (two) times daily with a meal.    . furosemide (LASIX) 20 MG tablet Take 20 mg by mouth every other day.     . furosemide (LASIX) 20 MG tablet     . hydrocortisone (CORTEF) 10 MG tablet Take 25 mg by mouth as directed.     Marland Kitchen levothyroxine (SYNTHROID, LEVOTHROID) 112 MCG tablet     . metoprolol tartrate (LOPRESSOR) 25 MG tablet Take 25 mg by mouth once.    . Multiple Vitamin (MULTI-VITAMINS) TABS Take by mouth.    . pantoprazole (PROTONIX) 40 MG tablet Take 40 mg by mouth 2 (two) times daily.     Marland Kitchen doxycycline (VIBRA-TABS) 100 MG tablet TAKE 1 TABLET (100 MG TOTAL) BY MOUTH 2 (TWO) TIMES DAILY.  0  . venlafaxine XR (EFFEXOR-XR) 150 MG 24 hr capsule 150 mg every morning.      No current facility-administered medications for this visit.    OBJECTIVE:  Filed Vitals:   08/04/15 1127  BP: 120/61  Pulse: 73  Temp: 96.9 F (36.1 C)     Body mass index is 25.76 kg/(m^2).    ECOG FS:1 - Symptomatic but completely ambulatory  PHYSICAL EXAM: GENERAL:  Well developed, well nourished, sitting comfortably in the exam room in no acute distress. MENTAL STATUS:  Alert and oriented to person, place and time.  ENT:  Oropharynx clear without lesion.  Tongue normal. Mucous membranes moist.  RESPIRATORY:  Clear to auscultation without rales, wheezes or rhonchi. CARDIOVASCULAR:  Regular rate and rhythm without murmur, rub or gallop. BREAST:  Not examined today  ABDOMEN:  Soft, non-tender, with active bowel sounds, and no hepatosplenomegaly.  No masses. BACK:  No CVA tenderness.  No tenderness on percussion of the back or rib cage. SKIN:  No rashes, ulcers or lesions. EXTREMITIES: No edema, no skin discoloration or tenderness.  No palpable cords. LYMPH NODES: Palpable lymph node in the writing Y no area NEUROLOGICAL: Unremarkable. PSYCH:  Appropriate.   LAB RESULTS:  DIAGNOSIS: (Jul 17, 2015)  A. LYMPH NODE, RIGHT SUPERFICIAL INGUINAL; EXCISION:  - METASTATIC MELANOMA WITH NECROSIS.   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 We will refer this patient for possibility of local radiation therapy for local control Patient needs to be followed with another PET scan in 3-6 months or before the patient has any recurrent disease We will hold off any immunotherapy unless there is recurrent disease.  Case and been discussed in tumor conference. I will discuss situation today with Dr. Baruch Gouty, Patient is aware of my retirement planning will be followed by Dr. B my associate Total duration of visit was 36minutes.  50% or more time was spent in counseling patient and family regarding prognosis and options of treatment and available resources    No matching staging information was found for the patient.  Forest Gleason, MD   08/04/2015 12:09 PM

## 2015-08-17 ENCOUNTER — Ambulatory Visit
Admission: RE | Admit: 2015-08-17 | Discharge: 2015-08-17 | Disposition: A | Discharge status: Home / Self Care | Payer: Medicare Other | Source: Ambulatory Visit | Attending: Radiation Oncology | Admitting: Radiation Oncology

## 2015-08-17 ENCOUNTER — Encounter: Payer: Self-pay | Admitting: Radiation Oncology

## 2015-08-17 VITALS — Wt 165.9 lb

## 2015-08-17 DIAGNOSIS — C779 Secondary and unspecified malignant neoplasm of lymph node, unspecified: Secondary | ICD-10-CM | POA: Diagnosis not present

## 2015-08-17 DIAGNOSIS — C799 Secondary malignant neoplasm of unspecified site: Secondary | ICD-10-CM

## 2015-08-17 DIAGNOSIS — I509 Heart failure, unspecified: Secondary | ICD-10-CM | POA: Insufficient documentation

## 2015-08-17 DIAGNOSIS — C774 Secondary and unspecified malignant neoplasm of inguinal and lower limb lymph nodes: Secondary | ICD-10-CM | POA: Insufficient documentation

## 2015-08-17 DIAGNOSIS — C439 Malignant melanoma of skin, unspecified: Secondary | ICD-10-CM | POA: Diagnosis not present

## 2015-08-17 DIAGNOSIS — Z510 Encounter for antineoplastic radiation therapy: Secondary | ICD-10-CM | POA: Insufficient documentation

## 2015-08-17 DIAGNOSIS — K219 Gastro-esophageal reflux disease without esophagitis: Secondary | ICD-10-CM | POA: Insufficient documentation

## 2015-08-17 DIAGNOSIS — F17210 Nicotine dependence, cigarettes, uncomplicated: Secondary | ICD-10-CM | POA: Insufficient documentation

## 2015-08-17 DIAGNOSIS — Z809 Family history of malignant neoplasm, unspecified: Secondary | ICD-10-CM | POA: Insufficient documentation

## 2015-08-17 NOTE — Consult Note (Signed)
Except an outstanding is perfect of Radiation Oncology NEW PATIENT EVALUATION  Name: Patricia Li  MRN: [id number]  Date:   08/17/2015     DOB: [birth year]   This 55 y.o. female patient presents to the clinic for initial evaluation of metastatic malignant melanoma to right groin status post lymph node dissection.  REFERRING PHYSICIAN: Donnie Mesa, MD  CHIEF COMPLAINT:  Chief Complaint  Patient presents with  . Cancer    Pt is here for initial consultation of melanoma to right inguinal lymph node.      DIAGNOSIS: The encounter diagnosis was Malignant melanoma, metastatic ([place]).   PREVIOUS INVESTIGATIONS:  PET CT scan reviewed Surgical pathology report reviewed Clinical notes reviewed  HPI: Patient is a pleasant 55 year old females history dates back to 2009 when she developed melanoma to her right thigh which was from an unknown primary site. She had 4 weeks of Intron therapy followed by low-dose interferon. She developed recurrent melanoma in the right thigh and pelvic lymph nodes in August 2012 B-RAF not mutated. She was started onipillumab in October 2012. Recently presented with a right inguinal adenopathy in April 2017 underwent excisional biopsy after PET CT scan showed this to be hypermetabolic metabolic area. Biopsy was positive for large necrotic lymph node with metastatic malignant melanoma. Her case was discussed at our weekly tumor conference and recommendation for adjuvant radiation therapy to this nodal basin was made. She seen today for consultation and opinion. She is doing well. She is healed nicely no complaints. No other evidence of any nodularity noted. No swelling in her right lower extremity.  PLANNED TREATMENT REGIMEN: Right inguinal radiation therapy   PAST MEDICAL HISTORY:  has a past medical history of Lumbar herniated disc (2010); Personal history of malignant melanoma of skin (2012); GERD (gastroesophageal reflux disease) (2012); CHF (congestive heart  failure) ([place]) (2016); Tick bite of head (07/20/2015); and Malignant melanoma of skin of lower limb, including hip ([place]) (2008, 2017).    PAST SURGICAL HISTORY:  Past Surgical History  Procedure Laterality Date  . Melanoma excision  2008    right thigh  . Back surgery  2010  . Skin lesion excision Right 12-19-10    posterior  . Colonoscopy  2013    [facility]  . Fracture surgery      rod removed from left femur 07/21/2014  . Breast cyst excision  1985  . Inguinal lymph node biopsy Right 07/17/2015    Procedure: INGUINAL LYMPH NODE BIOPSY/EXCISION;  Surgeon: Robert Bellow, MD;  Location: [facility] ORS;  Service: General;  Laterality: Right;    FAMILY HISTORY: family history includes COPD in her father; Cancer in her sister.  SOCIAL HISTORY:  reports that she has been smoking Cigarettes.  She has a 10 pack-year smoking history. She has never used smokeless tobacco. She reports that she drinks alcohol. She reports that she does not use illicit drugs.  ALLERGIES: Clindamycin; Aripiprazole; and Valproic acid  MEDICATIONS:  Current Outpatient Prescriptions  Medication Sig Dispense Refill  . calcium carbonate (OS-CAL) 600 MG TABS tablet Take 600 mg by mouth 2 (two) times daily with a meal.    . furosemide (LASIX) 20 MG tablet Take 20 mg by mouth every other day.     . furosemide (LASIX) 20 MG tablet     . hydrocortisone (CORTEF) 10 MG tablet Take 25 mg by mouth as directed.     Marland Kitchen levothyroxine (SYNTHROID, LEVOTHROID) 112 MCG tablet     . metoprolol tartrate (LOPRESSOR) 25 MG  tablet Take 25 mg by mouth once.    . Multiple Vitamin (MULTI-VITAMINS) TABS Take by mouth.    . pantoprazole (PROTONIX) 40 MG tablet Take 40 mg by mouth 2 (two) times daily.     Marland Kitchen venlafaxine XR (EFFEXOR-XR) 150 MG 24 hr capsule 150 mg every morning.      No current facility-administered medications for this encounter.    ECOG PERFORMANCE STATUS:  0 - Asymptomatic  REVIEW OF SYSTEMS:  Patient denies any weight loss,  fatigue, weakness, fever, chills or night sweats. Patient denies any loss of vision, blurred vision. Patient denies any ringing  of the ears or hearing loss. No irregular heartbeat. Patient denies heart murmur or history of fainting. Patient denies any chest pain or pain radiating to her upper extremities. Patient denies any shortness of breath, difficulty breathing at night, cough or hemoptysis. Patient denies any swelling in the lower legs. Patient denies any nausea vomiting, vomiting of blood, or coffee ground material in the vomitus. Patient denies any stomach pain. Patient states has had normal bowel movements no significant constipation or diarrhea. Patient denies any dysuria, hematuria or significant nocturia. Patient denies any problems walking, swelling in the joints or loss of balance. Patient denies any skin changes, loss of hair or loss of weight. Patient denies any excessive worrying or anxiety or significant depression. Patient denies any problems with insomnia. Patient denies excessive thirst, polyuria, polydipsia. Patient denies any swollen glands, patient denies easy bruising or easy bleeding. Patient denies any recent infections, allergies or URI. Patient "s visual fields have not changed significantly in recent time.    PHYSICAL EXAM: Wt 165 lb 14.3 oz (75.25 kg) She status post excision of the right inguinal region which is well-healed. No evidence of mass or nodularity in the inguinal region bilaterally is noted. No evidence of lymphedema in the right lower extremity is noted. Well-developed well-nourished patient in NAD. HEENT reveals PERLA, EOMI, discs not visualized.  Oral cavity is clear. No oral mucosal lesions are identified. Neck is clear without evidence of cervical or supraclavicular adenopathy. Lungs are clear to A&P. Cardiac examination is essentially unremarkable with regular rate and rhythm without murmur rub or thrill. Abdomen is benign with no organomegaly or masses noted.  Motor sensory and DTR levels are equal and symmetric in the upper and lower extremities. Cranial nerves II through XII are grossly intact. Proprioception is intact. No peripheral adenopathy or edema is identified. No motor or sensory levels are noted. Crude visual fields are within normal range. LABORATORY DATA: Pathology report reviewed   RADIOLOGY RESULTS:PET CT scan reviewed  IMPRESSION: Metastatic melanoma large right inguinal nodes status post resection with patient history of stage IV melanoma.   PLAN: Data has shown almost a 30% reduction in recurrent disease and nodal basin status post radiation therapy. Doses greater than 5000 cGy have been shown to be more effective at decreasing local regional recurrence. No evidence of overall survival benefit is been seen however in no radiation. Based on the large necrotic nodal metastasis I believe a course of 5400 cGy in 27 fractions to her right inguinal area would be beneficial in decreasing chances of local regional recurrence. Risks and benefits of treatment including skin reaction fatigue possible lymphedema of her right lower extremity all were discussed in detail. We will try to evaluate whether we can do this with electron beam versus photon therapy at the time of treatment planning. I have personally scheduled CT simulation for later this week. Patient and her  father both seem to comprehend my treatment plan well.  I would like to take this opportunity to thank you for allowing me to participate in the care of your patient.Armstead Peaks., MD

## 2015-08-19 ENCOUNTER — Ambulatory Visit
Admission: RE | Admit: 2015-08-19 | Discharge: 2015-08-19 | Disposition: A | Discharge status: Home / Self Care | Payer: Medicare Other | Source: Ambulatory Visit | Attending: Radiation Oncology | Admitting: Radiation Oncology

## 2015-08-19 ENCOUNTER — Inpatient Hospital Stay: Payer: Medicare Other | Admitting: Family Medicine

## 2015-08-19 ENCOUNTER — Telehealth: Payer: Self-pay | Admitting: Family Medicine

## 2015-08-19 ENCOUNTER — Other Ambulatory Visit: Payer: Self-pay | Admitting: Family Medicine

## 2015-08-19 ENCOUNTER — Telehealth: Payer: Self-pay | Admitting: *Deleted

## 2015-08-19 ENCOUNTER — Ambulatory Visit: Payer: Medicare Other

## 2015-08-19 ENCOUNTER — Ambulatory Visit (INDEPENDENT_AMBULATORY_CARE_PROVIDER_SITE_OTHER): Payer: Medicare Other | Admitting: General Surgery

## 2015-08-19 ENCOUNTER — Encounter: Payer: Self-pay | Admitting: General Surgery

## 2015-08-19 VITALS — BP 124/74 | HR 82 | Temp 96.9°F | Resp 12 | Ht 67.0 in | Wt 179.0 lb

## 2015-08-19 DIAGNOSIS — K4090 Unilateral inguinal hernia, without obstruction or gangrene, not specified as recurrent: Secondary | ICD-10-CM | POA: Diagnosis not present

## 2015-08-19 DIAGNOSIS — Z8249 Family history of ischemic heart disease and other diseases of the circulatory system: Secondary | ICD-10-CM | POA: Diagnosis not present

## 2015-08-19 DIAGNOSIS — R599 Enlarged lymph nodes, unspecified: Secondary | ICD-10-CM

## 2015-08-19 DIAGNOSIS — C774 Secondary and unspecified malignant neoplasm of inguinal and lower limb lymph nodes: Secondary | ICD-10-CM | POA: Diagnosis not present

## 2015-08-19 DIAGNOSIS — Z809 Family history of malignant neoplasm, unspecified: Secondary | ICD-10-CM | POA: Diagnosis not present

## 2015-08-19 DIAGNOSIS — R51 Headache: Secondary | ICD-10-CM | POA: Diagnosis not present

## 2015-08-19 DIAGNOSIS — R102 Pelvic and perineal pain: Secondary | ICD-10-CM | POA: Diagnosis not present

## 2015-08-19 DIAGNOSIS — C439 Malignant melanoma of skin, unspecified: Secondary | ICD-10-CM | POA: Diagnosis not present

## 2015-08-19 DIAGNOSIS — E039 Hypothyroidism, unspecified: Secondary | ICD-10-CM | POA: Diagnosis not present

## 2015-08-19 DIAGNOSIS — Z85820 Personal history of malignant melanoma of skin: Secondary | ICD-10-CM | POA: Diagnosis not present

## 2015-08-19 DIAGNOSIS — F329 Major depressive disorder, single episode, unspecified: Secondary | ICD-10-CM | POA: Diagnosis not present

## 2015-08-19 DIAGNOSIS — I509 Heart failure, unspecified: Secondary | ICD-10-CM | POA: Diagnosis not present

## 2015-08-19 DIAGNOSIS — L905 Scar conditions and fibrosis of skin: Secondary | ICD-10-CM | POA: Diagnosis not present

## 2015-08-19 DIAGNOSIS — F17210 Nicotine dependence, cigarettes, uncomplicated: Secondary | ICD-10-CM | POA: Diagnosis not present

## 2015-08-19 DIAGNOSIS — R42 Dizziness and giddiness: Secondary | ICD-10-CM | POA: Diagnosis not present

## 2015-08-19 DIAGNOSIS — M79659 Pain in unspecified thigh: Secondary | ICD-10-CM | POA: Diagnosis not present

## 2015-08-19 DIAGNOSIS — Z9221 Personal history of antineoplastic chemotherapy: Secondary | ICD-10-CM | POA: Diagnosis not present

## 2015-08-19 DIAGNOSIS — R590 Localized enlarged lymph nodes: Secondary | ICD-10-CM

## 2015-08-19 DIAGNOSIS — G4700 Insomnia, unspecified: Secondary | ICD-10-CM | POA: Diagnosis not present

## 2015-08-19 DIAGNOSIS — Z6825 Body mass index (BMI) 25.0-25.9, adult: Secondary | ICD-10-CM | POA: Diagnosis not present

## 2015-08-19 DIAGNOSIS — R509 Fever, unspecified: Secondary | ICD-10-CM | POA: Diagnosis not present

## 2015-08-19 DIAGNOSIS — Z510 Encounter for antineoplastic radiation therapy: Secondary | ICD-10-CM | POA: Diagnosis not present

## 2015-08-19 DIAGNOSIS — K219 Gastro-esophageal reflux disease without esophagitis: Secondary | ICD-10-CM | POA: Diagnosis not present

## 2015-08-19 DIAGNOSIS — N183 Chronic kidney disease, stage 3 (moderate): Secondary | ICD-10-CM | POA: Diagnosis not present

## 2015-08-19 DIAGNOSIS — M791 Myalgia: Secondary | ICD-10-CM | POA: Diagnosis not present

## 2015-08-19 DIAGNOSIS — C779 Secondary and unspecified malignant neoplasm of lymph node, unspecified: Secondary | ICD-10-CM | POA: Diagnosis not present

## 2015-08-19 NOTE — Telephone Encounter (Signed)
Per Dr Rogue Bussing see NP today. Patient agrees to see NP at 230 today

## 2015-08-19 NOTE — Telephone Encounter (Signed)
Patient had a CT simulation with Dr. Baruch Gouty scheduled this morning. He reviewed CT images from yesterday at [facility] in [place]. He suspects patient has a seroma and has discussed with Dr. Bary Castilla who is the patient's surgeon. Dr. Bary Castilla to see patient this afternoon.

## 2015-08-19 NOTE — Progress Notes (Signed)
Patient ID: Patricia Li, female   DOB: March 15, 1960, 55 y.o.   MRN: [id number]  Chief Complaint  Patient presents with  . Other    swelling inguinal node    HPI Patricia Li is a 55 y.o. female here today for a swelling right inguinal lymph node. She had a biopsy on 07/17/2015. She states she went to the ER in [facility] early this morning due to the pain she was having in that locatison. Started running a fever of 101 at the same time earlier this morning . Down to 100 at ED.  The patient denies sore throat, cough, nausea/vomiting/diarrhea or urinary symptoms.  PET scan done in April, 2017.  Sister Verenis Wurtzel present.   I personally reviewed the patient's history. HPI  Past Medical History  Diagnosis Date  . Lumbar herniated disc 2010  . Personal history of malignant melanoma of skin 2012  . GERD (gastroesophageal reflux disease) 2012  . CHF (congestive heart failure) ([place]) 2016    Dr. Nehemiah Massed  . Tick bite of head 07/20/2015  . Malignant melanoma of skin of lower limb, including hip ([place]) 2008, 2017    right thigh    Past Surgical History  Procedure Laterality Date  . Melanoma excision  2008    right thigh  . Back surgery  2010  . Skin lesion excision Right 12-19-10    posterior  . Colonoscopy  2013    [facility]  . Fracture surgery      rod removed from left femur 07/21/2014  . Breast cyst excision  1985  . Inguinal lymph node biopsy Right 07/17/2015    Procedure: INGUINAL LYMPH NODE BIOPSY/EXCISION;  Surgeon: Robert Bellow, MD;  Location: [facility] ORS;  Service: General;  Laterality: Right;    Family History  Problem Relation Age of Onset  . COPD Father   . Cancer Sister     Colon, sister    Social History Social History  Substance Use Topics  . Smoking status: Current Every Day Smoker -- 0.50 packs/day for 20 years    Types: Cigarettes  . Smokeless tobacco: Never Used  . Alcohol Use: 0.0 oz/week    0 Standard drinks or equivalent per week     Comment:  ocassionally    Allergies  Allergen Reactions  . Clindamycin Shortness Of Breath  . Aripiprazole Other (See Comments)    Facial and head ticks  . Valproic Acid     Other reaction(s): Other (See Comments) Facial twitching    Current Outpatient Prescriptions  Medication Sig Dispense Refill  . calcium carbonate (OS-CAL) 600 MG TABS tablet Take 600 mg by mouth 2 (two) times daily with a meal.    . furosemide (LASIX) 20 MG tablet Take 20 mg by mouth every other day.     . furosemide (LASIX) 20 MG tablet     . hydrocortisone (CORTEF) 10 MG tablet Take 25 mg by mouth as directed.     Marland Kitchen levothyroxine (SYNTHROID, LEVOTHROID) 112 MCG tablet     . metoprolol tartrate (LOPRESSOR) 25 MG tablet Take 25 mg by mouth once.    . Multiple Vitamin (MULTI-VITAMINS) TABS Take by mouth.    . pantoprazole (PROTONIX) 40 MG tablet Take 40 mg by mouth 2 (two) times daily.     Marland Kitchen venlafaxine XR (EFFEXOR-XR) 150 MG 24 hr capsule 150 mg every morning.      No current facility-administered medications for this visit.    Review of Systems Review of Systems  Constitutional: Positive for fever and chills.  Respiratory: Negative.   Cardiovascular: Negative.     Blood pressure 124/74, pulse 82, temperature 96.9 F (36.1 C), temperature source Oral, resp. rate 12, height 5\' 7"  (1.702 m), weight 179 lb (81.194 kg).  Physical Exam Physical Exam  Constitutional: She is oriented to person, place, and time. She appears well-developed and well-nourished.  Eyes: Conjunctivae are normal. No scleral icterus.  Neck: Neck supple.  Cardiovascular: Normal rate, regular rhythm and normal heart sounds.   No edema  Pulmonary/Chest: Effort normal and breath sounds normal.  Abdominal: Soft. Normal appearance and bowel sounds are normal.    Lymphadenopathy:    She has no cervical adenopathy.       Right: No inguinal adenopathy present.  Neurological: She is alert and oriented to person, place, and time.  Skin: Skin is  warm and dry.    Data Reviewed CT from [facility] reviewed. Enlarged right external iliac node, not identified on April 2017 PET scan.  Assessment    Progressive metastatic disease, pain likely secondary to intranodal hemorrhage.    Plan    No evidence of active bleeding. No other acute abdominal process identified on my review.  Case reviewed with Noreene Filbert, M.D. from radiation oncology. This node will be included in the planned radiation field.    Patient to return as needed., PCP:  [id number] This information has been scribed by Gaspar Cola CMA.   Robert Bellow 08/19/2015, 1:45 PM

## 2015-08-19 NOTE — Patient Instructions (Signed)
Patient to return as needed. 

## 2015-08-21 ENCOUNTER — Other Ambulatory Visit: Payer: Self-pay | Admitting: *Deleted

## 2015-08-21 DIAGNOSIS — C779 Secondary and unspecified malignant neoplasm of lymph node, unspecified: Secondary | ICD-10-CM | POA: Diagnosis not present

## 2015-08-21 DIAGNOSIS — I509 Heart failure, unspecified: Secondary | ICD-10-CM | POA: Diagnosis not present

## 2015-08-21 DIAGNOSIS — K219 Gastro-esophageal reflux disease without esophagitis: Secondary | ICD-10-CM | POA: Diagnosis not present

## 2015-08-21 DIAGNOSIS — C439 Malignant melanoma of skin, unspecified: Secondary | ICD-10-CM | POA: Diagnosis not present

## 2015-08-21 DIAGNOSIS — C774 Secondary and unspecified malignant neoplasm of inguinal and lower limb lymph nodes: Secondary | ICD-10-CM | POA: Diagnosis not present

## 2015-08-21 DIAGNOSIS — Z510 Encounter for antineoplastic radiation therapy: Secondary | ICD-10-CM | POA: Diagnosis not present

## 2015-08-21 DIAGNOSIS — F17210 Nicotine dependence, cigarettes, uncomplicated: Secondary | ICD-10-CM | POA: Diagnosis not present

## 2015-08-24 DIAGNOSIS — K219 Gastro-esophageal reflux disease without esophagitis: Secondary | ICD-10-CM | POA: Diagnosis not present

## 2015-08-24 DIAGNOSIS — I509 Heart failure, unspecified: Secondary | ICD-10-CM | POA: Diagnosis not present

## 2015-08-24 DIAGNOSIS — Z510 Encounter for antineoplastic radiation therapy: Secondary | ICD-10-CM | POA: Diagnosis not present

## 2015-08-24 DIAGNOSIS — C439 Malignant melanoma of skin, unspecified: Secondary | ICD-10-CM | POA: Diagnosis not present

## 2015-08-24 DIAGNOSIS — C774 Secondary and unspecified malignant neoplasm of inguinal and lower limb lymph nodes: Secondary | ICD-10-CM | POA: Diagnosis not present

## 2015-08-24 DIAGNOSIS — F17210 Nicotine dependence, cigarettes, uncomplicated: Secondary | ICD-10-CM | POA: Diagnosis not present

## 2015-08-24 DIAGNOSIS — C779 Secondary and unspecified malignant neoplasm of lymph node, unspecified: Secondary | ICD-10-CM | POA: Diagnosis not present

## 2015-08-25 ENCOUNTER — Telehealth: Payer: Self-pay | Admitting: General Surgery

## 2015-08-25 ENCOUNTER — Encounter: Payer: Self-pay | Admitting: *Deleted

## 2015-08-25 NOTE — Telephone Encounter (Signed)
x

## 2015-08-25 NOTE — Telephone Encounter (Signed)
08-25-15@825AM  PT CALLED & STATED SHE'S HAVING SOME PAIN IN HER GROIN AREA.NOT AS STRONG AS LAST WEEK,BUT WANTED YOU TO BE A WHERE.SHE HAS TAKEN SOME TYLENOL.THE AREA IS TENDER. PLEASE ADVISE.PT CAN BE REACH @ HER [phone]

## 2015-08-25 NOTE — Telephone Encounter (Signed)
I talked with the patient, she states the area was a throbbing pain but is controlled with tylenol. The pain is at the incision site and realizes it is probably scar tissue and still healing. She starts radiation tomorrow and will keep monitoring the area for changes. Appreciates phone call.

## 2015-08-25 NOTE — Telephone Encounter (Signed)
Please contact the patient and see what her concerns are. Recently seen and noted with new internal lymph nodes from her known melanoma. Would from original node excision site healing well.

## 2015-08-26 ENCOUNTER — Ambulatory Visit: Payer: Self-pay | Admitting: General Surgery

## 2015-08-26 ENCOUNTER — Ambulatory Visit
Admission: RE | Admit: 2015-08-26 | Discharge: 2015-08-26 | Disposition: A | Discharge status: Home / Self Care | Payer: Medicare Other | Source: Ambulatory Visit | Attending: Radiation Oncology | Admitting: Radiation Oncology

## 2015-08-26 DIAGNOSIS — F17210 Nicotine dependence, cigarettes, uncomplicated: Secondary | ICD-10-CM | POA: Diagnosis not present

## 2015-08-26 DIAGNOSIS — C439 Malignant melanoma of skin, unspecified: Secondary | ICD-10-CM | POA: Diagnosis not present

## 2015-08-26 DIAGNOSIS — C774 Secondary and unspecified malignant neoplasm of inguinal and lower limb lymph nodes: Secondary | ICD-10-CM | POA: Diagnosis not present

## 2015-08-26 DIAGNOSIS — K219 Gastro-esophageal reflux disease without esophagitis: Secondary | ICD-10-CM | POA: Diagnosis not present

## 2015-08-26 DIAGNOSIS — C779 Secondary and unspecified malignant neoplasm of lymph node, unspecified: Secondary | ICD-10-CM | POA: Diagnosis not present

## 2015-08-26 DIAGNOSIS — I509 Heart failure, unspecified: Secondary | ICD-10-CM | POA: Diagnosis not present

## 2015-08-26 DIAGNOSIS — Z510 Encounter for antineoplastic radiation therapy: Secondary | ICD-10-CM | POA: Diagnosis not present

## 2015-08-27 ENCOUNTER — Ambulatory Visit
Admission: RE | Admit: 2015-08-27 | Discharge: 2015-08-27 | Disposition: A | Discharge status: Home / Self Care | Payer: Medicare Other | Source: Ambulatory Visit | Attending: Radiation Oncology | Admitting: Radiation Oncology

## 2015-08-27 ENCOUNTER — Ambulatory Visit: Payer: Medicare Other

## 2015-08-27 DIAGNOSIS — F17210 Nicotine dependence, cigarettes, uncomplicated: Secondary | ICD-10-CM | POA: Diagnosis not present

## 2015-08-27 DIAGNOSIS — K219 Gastro-esophageal reflux disease without esophagitis: Secondary | ICD-10-CM | POA: Diagnosis not present

## 2015-08-27 DIAGNOSIS — C439 Malignant melanoma of skin, unspecified: Secondary | ICD-10-CM | POA: Diagnosis not present

## 2015-08-27 DIAGNOSIS — I509 Heart failure, unspecified: Secondary | ICD-10-CM | POA: Diagnosis not present

## 2015-08-27 DIAGNOSIS — Z510 Encounter for antineoplastic radiation therapy: Secondary | ICD-10-CM | POA: Diagnosis not present

## 2015-08-27 DIAGNOSIS — C774 Secondary and unspecified malignant neoplasm of inguinal and lower limb lymph nodes: Secondary | ICD-10-CM | POA: Diagnosis not present

## 2015-08-28 ENCOUNTER — Ambulatory Visit
Admission: RE | Admit: 2015-08-28 | Discharge: 2015-08-28 | Disposition: A | Discharge status: Home / Self Care | Payer: Medicare Other | Source: Ambulatory Visit | Attending: Radiation Oncology | Admitting: Radiation Oncology

## 2015-08-28 ENCOUNTER — Ambulatory Visit: Payer: Medicare Other

## 2015-08-28 ENCOUNTER — Telehealth: Payer: Self-pay | Admitting: *Deleted

## 2015-08-28 DIAGNOSIS — K219 Gastro-esophageal reflux disease without esophagitis: Secondary | ICD-10-CM | POA: Diagnosis not present

## 2015-08-28 DIAGNOSIS — C439 Malignant melanoma of skin, unspecified: Secondary | ICD-10-CM | POA: Diagnosis not present

## 2015-08-28 DIAGNOSIS — F17210 Nicotine dependence, cigarettes, uncomplicated: Secondary | ICD-10-CM | POA: Diagnosis not present

## 2015-08-28 DIAGNOSIS — C774 Secondary and unspecified malignant neoplasm of inguinal and lower limb lymph nodes: Secondary | ICD-10-CM | POA: Diagnosis not present

## 2015-08-28 DIAGNOSIS — Z510 Encounter for antineoplastic radiation therapy: Secondary | ICD-10-CM | POA: Diagnosis not present

## 2015-08-28 DIAGNOSIS — I509 Heart failure, unspecified: Secondary | ICD-10-CM | POA: Diagnosis not present

## 2015-08-28 NOTE — Telephone Encounter (Signed)
Patient contacted scheduler. She voiced concerns about her plan of care. States that she has left multiple msgs with triage within the week and no one has returned her call. Voiced concerns about feedback regarding her care." States that she would specifically like to speak to Dr. Rogue Bussing.  RN attempted to contact pt back. Unable to leave msg. I entered my phone number of cancer center.   msg sent to cancer center scheduling-ok to sch. Pt today if she desires at 130pm. However, currently unable to reach patient to offer or confirm this appointment.  Dr. Rogue Bussing made aware.

## 2015-08-28 NOTE — Telephone Encounter (Signed)
Per patient request-RN attempted to contact. RN spoke with patient.  She expresses multiple health concerns.   Pt states that she went to [facility] ED on June 21' st for increasing pelvic pain.  She reports that since coming home from the ED, she still reports intense pelvic pressure.  She described her pain as "deep, throbbing cramping sensation/pain around my incision in my belly and groin" "A few days ago, I felt like I had an ovarian cysts or like I was having a baby." pt is only using Medication-acetaminophen 500 mg tablets x 2 a day for pain. "This only takes the edge off. Walking aggravates this pain."  Pt also reports that she has a night time-dry hacking cough.  She is currently not taking anything for cough.  She also reports a h/o "water-like blisters on knee on left-size of a thumbnail-looked like a blackish purple in color" - occurred June 5'th. Pt stated, "I popped this blister cyst with my finger nail and blood oozed out. Now, it's a tiny scar left"  "I developed a 2nd one on my right thigh 1/2 the size of the one on my left knee.  I left this one alone and the didn't touch it. It is getting smaller. I don't know what these areas are."  "I wanted to meet with Dr. Rogue Bussing and speak to him about these concerns."  Pt states that she was given an apt for 830am on Wednesday 09/02/15. She insisted that this was her apt time with Dr. Rogue Bussing." I explained to patient that this apt was not yet scheduled. However, I asked if she would be wiling to come at 915am after her radiation apts on Wednesday instead of 830 am. I explained that she would be a work in and she may have a delay being evaluated that day. I apologized in advance for any potential delays in seeing the patient on time for this appointment.

## 2015-08-31 ENCOUNTER — Ambulatory Visit
Admission: RE | Admit: 2015-08-31 | Discharge: 2015-08-31 | Disposition: A | Discharge status: Home / Self Care | Payer: Medicare Other | Source: Ambulatory Visit | Attending: Radiation Oncology | Admitting: Radiation Oncology

## 2015-08-31 ENCOUNTER — Encounter: Payer: Self-pay | Admitting: *Deleted

## 2015-08-31 ENCOUNTER — Ambulatory Visit: Payer: Medicare Other

## 2015-08-31 DIAGNOSIS — Z510 Encounter for antineoplastic radiation therapy: Secondary | ICD-10-CM | POA: Diagnosis not present

## 2015-08-31 DIAGNOSIS — C439 Malignant melanoma of skin, unspecified: Secondary | ICD-10-CM | POA: Diagnosis not present

## 2015-08-31 DIAGNOSIS — F17210 Nicotine dependence, cigarettes, uncomplicated: Secondary | ICD-10-CM | POA: Diagnosis not present

## 2015-08-31 DIAGNOSIS — C774 Secondary and unspecified malignant neoplasm of inguinal and lower limb lymph nodes: Secondary | ICD-10-CM | POA: Diagnosis not present

## 2015-08-31 DIAGNOSIS — K219 Gastro-esophageal reflux disease without esophagitis: Secondary | ICD-10-CM | POA: Diagnosis not present

## 2015-08-31 DIAGNOSIS — I509 Heart failure, unspecified: Secondary | ICD-10-CM | POA: Diagnosis not present

## 2015-08-31 NOTE — Progress Notes (Signed)
Fax request submitted to [facility] for patient per md order. msg sent via fax to [facility] that test will be added.

## 2015-09-02 ENCOUNTER — Inpatient Hospital Stay (HOSPITAL_BASED_OUTPATIENT_CLINIC_OR_DEPARTMENT_OTHER): Payer: Medicare Other | Admitting: Internal Medicine

## 2015-09-02 ENCOUNTER — Other Ambulatory Visit: Payer: Self-pay | Admitting: *Deleted

## 2015-09-02 ENCOUNTER — Ambulatory Visit: Payer: Medicare Other

## 2015-09-02 ENCOUNTER — Inpatient Hospital Stay: Payer: Medicare Other | Attending: Internal Medicine

## 2015-09-02 ENCOUNTER — Ambulatory Visit
Admission: RE | Admit: 2015-09-02 | Discharge: 2015-09-02 | Disposition: A | Discharge status: Home / Self Care | Payer: Medicare Other | Source: Ambulatory Visit | Attending: Radiation Oncology | Admitting: Radiation Oncology

## 2015-09-02 VITALS — BP 144/71 | HR 68 | Temp 97.7°F | Resp 18 | Wt 168.7 lb

## 2015-09-02 DIAGNOSIS — Z79899 Other long term (current) drug therapy: Secondary | ICD-10-CM | POA: Diagnosis not present

## 2015-09-02 DIAGNOSIS — E538 Deficiency of other specified B group vitamins: Secondary | ICD-10-CM | POA: Insufficient documentation

## 2015-09-02 DIAGNOSIS — I509 Heart failure, unspecified: Secondary | ICD-10-CM

## 2015-09-02 DIAGNOSIS — F17210 Nicotine dependence, cigarettes, uncomplicated: Secondary | ICD-10-CM | POA: Insufficient documentation

## 2015-09-02 DIAGNOSIS — M5126 Other intervertebral disc displacement, lumbar region: Secondary | ICD-10-CM | POA: Insufficient documentation

## 2015-09-02 DIAGNOSIS — Z9221 Personal history of antineoplastic chemotherapy: Secondary | ICD-10-CM | POA: Diagnosis not present

## 2015-09-02 DIAGNOSIS — C4371 Malignant melanoma of right lower limb, including hip: Secondary | ICD-10-CM

## 2015-09-02 DIAGNOSIS — C774 Secondary and unspecified malignant neoplasm of inguinal and lower limb lymph nodes: Secondary | ICD-10-CM

## 2015-09-02 DIAGNOSIS — K219 Gastro-esophageal reflux disease without esophagitis: Secondary | ICD-10-CM

## 2015-09-02 DIAGNOSIS — C439 Malignant melanoma of skin, unspecified: Secondary | ICD-10-CM | POA: Diagnosis not present

## 2015-09-02 DIAGNOSIS — Z85820 Personal history of malignant melanoma of skin: Secondary | ICD-10-CM | POA: Diagnosis not present

## 2015-09-02 DIAGNOSIS — Z800 Family history of malignant neoplasm of digestive organs: Secondary | ICD-10-CM | POA: Insufficient documentation

## 2015-09-02 DIAGNOSIS — C792 Secondary malignant neoplasm of skin: Secondary | ICD-10-CM | POA: Insufficient documentation

## 2015-09-02 DIAGNOSIS — R102 Pelvic and perineal pain: Secondary | ICD-10-CM | POA: Diagnosis not present

## 2015-09-02 DIAGNOSIS — Z510 Encounter for antineoplastic radiation therapy: Secondary | ICD-10-CM | POA: Diagnosis not present

## 2015-09-02 LAB — CBC
HCT: 37.2 % (ref 35.0–47.0)
Hemoglobin: 12.9 g/dL (ref 12.0–16.0)
MCH: 32.2 pg (ref 26.0–34.0)
MCHC: 34.8 g/dL (ref 32.0–36.0)
MCV: 92.4 fL (ref 80.0–100.0)
PLATELETS: 251 10*3/uL (ref 150–440)
RBC: 4.03 MIL/uL (ref 3.80–5.20)
RDW: 12.6 % (ref 11.5–14.5)
WBC: 6.8 10*3/uL (ref 3.6–11.0)

## 2015-09-02 NOTE — Progress Notes (Signed)
Patient states she hurt her back over the weekend.  States when she eats her dinner on the weekends she throws up.  Just Friday and Saturday for past 3-4 weeks.

## 2015-09-02 NOTE — Assessment & Plan Note (Addendum)
#   MELANOMA- recurrent right ingiunal/ also right external [as per CT scan June OSH] evaluated by Dr. Bary Castilla; to be  included in radiation field.  # Leg pain/grin pain-offered vicodin- declined. States she will try Tylenol.  # Recommend follow-up in 2 weeks/CBC CMP and LDH. Patient will likely need immunotherapy; Previous poor tolerance to [facility].    I reviewed the images myself and with the patient and family in detail.

## 2015-09-02 NOTE — Progress Notes (Signed)
[place] OFFICE PROGRESS NOTE  Patient Care Team: Donnie Mesa, MD as PCP - General (Family Medicine) Robert Bellow, MD as Consulting Physician (General Surgery) Forest Gleason, MD (Oncology)  No matching staging information was found for the patient.   Oncology History   1. 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 TX, Nx, M1 tumor, stage IV. 2. 4 weeks of high in the Intron therapy high dose in February of 2009. 3. Low-dose interferon started from March of 2009  AUG 2012- Right thigh mass [Bx] Recurrent melanoma in the right thigh/ pelvic LN [PETscan]; NEG for BRAF V600E;   # OCT 2012- IPI x 2 cycles [stopped sec to intol- Side effects]  # April 2017- PET scan shows recurrent disease with- RIGHT INGUINAL adenopathy s/p Bx [Dr.Byrnett].June 2017 [OSH]- right external Iliac LN-  RT  # Vitamin B 12 deficiency        Malignant melanoma of skin of lower limb, including hip (HCC)    Initial Diagnosis Malignant melanoma of skin of lower limb, including hip (HCC)    Malignant melanoma of right lower extremity including hip ([place])   07/08/2015 Initial Diagnosis Malignant melanoma of right lower extremity including hip ([place])    INTERVAL HISTORY:  This is my first interaction with the patient as patient's primary oncologist has been Dr.Choksi. I reviewed the patient's prior charts/pertinent labs/imaging in detail; findings are summarized above.    Patricia Li 55 y.o.  female pleasant patient above history of malignant melanoma recurrent in the right groin- was in the emergency room at outside hospital for significant right groin pain where on the CT scan shows noted to have a right external iliac lymph node also concerning for metastatic melanoma.   Patient's pain is improving. She is taking only Tylenol as needed. Denies any nausea vomiting. Denies any headaches. No chest pain or shortness of breath or cough.  REVIEW OF SYSTEMS:  A complete  10 point review of system is done which is negative except mentioned above/history of present illness.   PAST MEDICAL HISTORY :  Past Medical History  Diagnosis Date  . Lumbar herniated disc 2010  . Personal history of malignant melanoma of skin 2012  . GERD (gastroesophageal reflux disease) 2012  . CHF (congestive heart failure) ([place]) 2016    Dr. Nehemiah Massed  . Tick bite of head 07/20/2015  . Malignant melanoma of skin of lower limb, including hip ([place]) 2008, 2017    right thigh    PAST SURGICAL HISTORY :   Past Surgical History  Procedure Laterality Date  . Melanoma excision  2008    right thigh  . Back surgery  2010  . Skin lesion excision Right 12-19-10    posterior  . Colonoscopy  2013    [facility]  . Fracture surgery      rod removed from left femur 07/21/2014  . Breast cyst excision  1985  . Inguinal lymph node biopsy Right 07/17/2015    Procedure: INGUINAL LYMPH NODE BIOPSY/EXCISION;  Surgeon: Robert Bellow, MD;  Location: [facility] ORS;  Service: General;  Laterality: Right;    FAMILY HISTORY :   Family History  Problem Relation Age of Onset  . COPD Father   . Cancer Sister     Colon, sister    SOCIAL HISTORY:   Social History  Substance Use Topics  . Smoking status: Current Every Day Smoker -- 0.50 packs/day for 20 years    Types: Cigarettes  . Smokeless tobacco:  Never Used  . Alcohol Use: 0.0 oz/week    0 Standard drinks or equivalent per week     Comment: ocassionally    ALLERGIES:  is allergic to clindamycin; aripiprazole; and valproic acid.  MEDICATIONS:  Current Outpatient Prescriptions  Medication Sig Dispense Refill  . calcium carbonate (OS-CAL) 600 MG TABS tablet Take 600 mg by mouth 2 (two) times daily with a meal.    . furosemide (LASIX) 20 MG tablet Take 20 mg by mouth every other day.     . furosemide (LASIX) 20 MG tablet     . hydrocortisone (CORTEF) 10 MG tablet Take 25 mg by mouth as directed.     Marland Kitchen levothyroxine (SYNTHROID, LEVOTHROID) 112 MCG  tablet     . metoprolol tartrate (LOPRESSOR) 25 MG tablet Take 25 mg by mouth once.    . Multiple Vitamin (MULTI-VITAMINS) TABS Take by mouth.    . pantoprazole (PROTONIX) 40 MG tablet Take 40 mg by mouth 2 (two) times daily.     Marland Kitchen venlafaxine XR (EFFEXOR-XR) 150 MG 24 hr capsule 150 mg every morning.      No current facility-administered medications for this visit.    PHYSICAL EXAMINATION: ECOG PERFORMANCE STATUS: 1 - Symptomatic but completely ambulatory  BP 144/71 mmHg  Pulse 68  Temp(Src) 97.7 F (36.5 C) (Tympanic)  Resp 18  Wt 168 lb 10.4 oz (76.5 kg)  Filed Weights   09/02/15 0923  Weight: 168 lb 10.4 oz (76.5 kg)    GENERAL: Well-nourished well-developed; Alert, no distress and comfortable.  Alone.  EYES: no pallor or icterus OROPHARYNX: no thrush or ulceration; good dentition  NECK: supple, no masses felt LYMPH:  no palpable lymphadenopathy in the cervical, axillary; right Inguinal region- ~2cm Ln felt.  LUNGS: clear to auscultation and  No wheeze or crackles HEART/CVS: regular rate & rhythm and no murmurs; No lower extremity edema ABDOMEN:abdomen soft, non-tender and normal bowel sounds Musculoskeletal:no cyanosis of digits and no clubbing  PSYCH: alert & oriented x 3 with fluent speech NEURO: no focal motor/sensory deficits SKIN:  no rashes or significant lesions  LABORATORY DATA:  I have reviewed the data as listed    Component Value Date/Time   NA 137 06/17/2015 1507   NA 140 10/10/2013 0957   K 3.7 06/17/2015 1507   K 3.4* 10/10/2013 0957   CL 106 06/17/2015 1507   CL 102 10/10/2013 0957   CO2 26 06/17/2015 1507   CO2 31 10/10/2013 0957   GLUCOSE 110* 06/17/2015 1507   GLUCOSE 84 10/10/2013 0957   BUN 19 06/17/2015 1507   BUN 15 10/10/2013 0957   CREATININE 1.17* 06/17/2015 1507   CREATININE 0.94 10/10/2013 0957   CALCIUM 9.0 06/17/2015 1507   CALCIUM 8.6 10/10/2013 0957   PROT 6.7 06/17/2015 1507   PROT 6.9 10/10/2013 0957   ALBUMIN 4.2  06/17/2015 1507   ALBUMIN 3.6 10/10/2013 0957   AST 20 06/17/2015 1507   AST 26 10/10/2013 0957   ALT 12* 06/17/2015 1507   ALT 21 10/10/2013 0957   ALKPHOS 52 06/17/2015 1507   ALKPHOS 82 10/10/2013 0957   BILITOT 0.5 06/17/2015 1507   BILITOT 0.8 10/10/2013 0957   GFRNONAA 52* 06/17/2015 1507   GFRNONAA >60 10/10/2013 0957   GFRNONAA 43* 03/14/2011 1009   GFRAA >60 06/17/2015 1507   GFRAA >60 10/10/2013 0957   GFRAA 52* 03/14/2011 1009    No results found for: SPEP, UPEP  Lab Results  Component Value Date  WBC 6.8 09/02/2015   NEUTROABS 3.8 06/17/2015   HGB 12.9 09/02/2015   HCT 37.2 09/02/2015   MCV 92.4 09/02/2015   PLT 251 09/02/2015      Chemistry      Component Value Date/Time   NA 137 06/17/2015 1507   NA 140 10/10/2013 0957   K 3.7 06/17/2015 1507   K 3.4* 10/10/2013 0957   CL 106 06/17/2015 1507   CL 102 10/10/2013 0957   CO2 26 06/17/2015 1507   CO2 31 10/10/2013 0957   BUN 19 06/17/2015 1507   BUN 15 10/10/2013 0957   CREATININE 1.17* 06/17/2015 1507   CREATININE 0.94 10/10/2013 0957      Component Value Date/Time   CALCIUM 9.0 06/17/2015 1507   CALCIUM 8.6 10/10/2013 0957   ALKPHOS 52 06/17/2015 1507   ALKPHOS 82 10/10/2013 0957   AST 20 06/17/2015 1507   AST 26 10/10/2013 0957   ALT 12* 06/17/2015 1507   ALT 21 10/10/2013 0957   BILITOT 0.5 06/17/2015 1507   BILITOT 0.8 10/10/2013 0957       RADIOGRAPHIC STUDIES: I have personally reviewed the radiological images as listed and agreed with the findings in the report. No results found.   ASSESSMENT & PLAN:  Malignant melanoma of right lower extremity including hip ([place]) # MELANOMA- recurrent right ingiunal/ also right external [as per CT scan June OSH] evaluated by Dr. Bary Castilla; to be  included in radiation field.  # Leg pain/grin pain-offered vicodin- declined. States she will try Tylenol.  # Recommend follow-up in 4 weeks/CBC CMP and LDH. Patient will likely need immunotherapy;  Previous poor tolerance to [facility].     Orders Placed This Encounter  Procedures  . Lactate dehydrogenase    Standing Status: Future     Number of Occurrences:      Standing Expiration Date: 09/01/2016   All questions were answered. The patient knows to call the clinic with any problems, questions or concerns.      Cammie Sickle, MD 09/02/2015 7:18 PM

## 2015-09-03 ENCOUNTER — Ambulatory Visit: Payer: Medicare Other

## 2015-09-03 ENCOUNTER — Ambulatory Visit
Admission: RE | Admit: 2015-09-03 | Discharge: 2015-09-03 | Disposition: A | Discharge status: Home / Self Care | Payer: Medicare Other | Source: Ambulatory Visit | Attending: Radiation Oncology | Admitting: Radiation Oncology

## 2015-09-03 DIAGNOSIS — Z510 Encounter for antineoplastic radiation therapy: Secondary | ICD-10-CM | POA: Diagnosis not present

## 2015-09-03 DIAGNOSIS — C779 Secondary and unspecified malignant neoplasm of lymph node, unspecified: Secondary | ICD-10-CM | POA: Diagnosis not present

## 2015-09-03 DIAGNOSIS — I509 Heart failure, unspecified: Secondary | ICD-10-CM | POA: Diagnosis not present

## 2015-09-03 DIAGNOSIS — F17210 Nicotine dependence, cigarettes, uncomplicated: Secondary | ICD-10-CM | POA: Diagnosis not present

## 2015-09-03 DIAGNOSIS — C439 Malignant melanoma of skin, unspecified: Secondary | ICD-10-CM | POA: Diagnosis not present

## 2015-09-03 DIAGNOSIS — C774 Secondary and unspecified malignant neoplasm of inguinal and lower limb lymph nodes: Secondary | ICD-10-CM | POA: Diagnosis not present

## 2015-09-03 DIAGNOSIS — K219 Gastro-esophageal reflux disease without esophagitis: Secondary | ICD-10-CM | POA: Diagnosis not present

## 2015-09-04 ENCOUNTER — Ambulatory Visit: Payer: Medicare Other

## 2015-09-04 ENCOUNTER — Ambulatory Visit
Admission: RE | Admit: 2015-09-04 | Discharge: 2015-09-04 | Disposition: A | Discharge status: Home / Self Care | Payer: Medicare Other | Source: Ambulatory Visit | Attending: Radiation Oncology | Admitting: Radiation Oncology

## 2015-09-04 DIAGNOSIS — C439 Malignant melanoma of skin, unspecified: Secondary | ICD-10-CM | POA: Diagnosis not present

## 2015-09-04 DIAGNOSIS — F17210 Nicotine dependence, cigarettes, uncomplicated: Secondary | ICD-10-CM | POA: Diagnosis not present

## 2015-09-04 DIAGNOSIS — Z510 Encounter for antineoplastic radiation therapy: Secondary | ICD-10-CM | POA: Diagnosis not present

## 2015-09-04 DIAGNOSIS — C774 Secondary and unspecified malignant neoplasm of inguinal and lower limb lymph nodes: Secondary | ICD-10-CM | POA: Diagnosis not present

## 2015-09-04 DIAGNOSIS — K219 Gastro-esophageal reflux disease without esophagitis: Secondary | ICD-10-CM | POA: Diagnosis not present

## 2015-09-04 DIAGNOSIS — I509 Heart failure, unspecified: Secondary | ICD-10-CM | POA: Diagnosis not present

## 2015-09-07 ENCOUNTER — Ambulatory Visit: Payer: Medicare Other

## 2015-09-07 ENCOUNTER — Ambulatory Visit
Admission: RE | Admit: 2015-09-07 | Discharge: 2015-09-07 | Disposition: A | Discharge status: Home / Self Care | Payer: Medicare Other | Source: Ambulatory Visit | Attending: Radiation Oncology | Admitting: Radiation Oncology

## 2015-09-07 DIAGNOSIS — K219 Gastro-esophageal reflux disease without esophagitis: Secondary | ICD-10-CM | POA: Diagnosis not present

## 2015-09-07 DIAGNOSIS — I509 Heart failure, unspecified: Secondary | ICD-10-CM | POA: Diagnosis not present

## 2015-09-07 DIAGNOSIS — F17210 Nicotine dependence, cigarettes, uncomplicated: Secondary | ICD-10-CM | POA: Diagnosis not present

## 2015-09-07 DIAGNOSIS — C439 Malignant melanoma of skin, unspecified: Secondary | ICD-10-CM | POA: Diagnosis not present

## 2015-09-07 DIAGNOSIS — C774 Secondary and unspecified malignant neoplasm of inguinal and lower limb lymph nodes: Secondary | ICD-10-CM | POA: Diagnosis not present

## 2015-09-07 DIAGNOSIS — Z510 Encounter for antineoplastic radiation therapy: Secondary | ICD-10-CM | POA: Diagnosis not present

## 2015-09-08 ENCOUNTER — Ambulatory Visit: Payer: Medicare Other

## 2015-09-08 ENCOUNTER — Ambulatory Visit
Admission: RE | Admit: 2015-09-08 | Discharge: 2015-09-08 | Disposition: A | Discharge status: Home / Self Care | Payer: Medicare Other | Source: Ambulatory Visit | Attending: Radiation Oncology | Admitting: Radiation Oncology

## 2015-09-08 DIAGNOSIS — C774 Secondary and unspecified malignant neoplasm of inguinal and lower limb lymph nodes: Secondary | ICD-10-CM | POA: Diagnosis not present

## 2015-09-08 DIAGNOSIS — F17210 Nicotine dependence, cigarettes, uncomplicated: Secondary | ICD-10-CM | POA: Diagnosis not present

## 2015-09-08 DIAGNOSIS — I509 Heart failure, unspecified: Secondary | ICD-10-CM | POA: Diagnosis not present

## 2015-09-08 DIAGNOSIS — K219 Gastro-esophageal reflux disease without esophagitis: Secondary | ICD-10-CM | POA: Diagnosis not present

## 2015-09-08 DIAGNOSIS — C439 Malignant melanoma of skin, unspecified: Secondary | ICD-10-CM | POA: Diagnosis not present

## 2015-09-08 DIAGNOSIS — Z510 Encounter for antineoplastic radiation therapy: Secondary | ICD-10-CM | POA: Diagnosis not present

## 2015-09-09 ENCOUNTER — Inpatient Hospital Stay: Payer: Medicare Other

## 2015-09-09 ENCOUNTER — Ambulatory Visit: Payer: Medicare Other

## 2015-09-09 ENCOUNTER — Ambulatory Visit
Admission: RE | Admit: 2015-09-09 | Discharge: 2015-09-09 | Disposition: A | Discharge status: Home / Self Care | Payer: Medicare Other | Source: Ambulatory Visit | Attending: Radiation Oncology | Admitting: Radiation Oncology

## 2015-09-09 DIAGNOSIS — C792 Secondary malignant neoplasm of skin: Secondary | ICD-10-CM | POA: Diagnosis not present

## 2015-09-09 DIAGNOSIS — K219 Gastro-esophageal reflux disease without esophagitis: Secondary | ICD-10-CM | POA: Diagnosis not present

## 2015-09-09 DIAGNOSIS — Z510 Encounter for antineoplastic radiation therapy: Secondary | ICD-10-CM | POA: Diagnosis not present

## 2015-09-09 DIAGNOSIS — C439 Malignant melanoma of skin, unspecified: Secondary | ICD-10-CM | POA: Diagnosis not present

## 2015-09-09 DIAGNOSIS — I509 Heart failure, unspecified: Secondary | ICD-10-CM | POA: Diagnosis not present

## 2015-09-09 DIAGNOSIS — F17210 Nicotine dependence, cigarettes, uncomplicated: Secondary | ICD-10-CM | POA: Diagnosis not present

## 2015-09-09 DIAGNOSIS — C774 Secondary and unspecified malignant neoplasm of inguinal and lower limb lymph nodes: Secondary | ICD-10-CM | POA: Diagnosis not present

## 2015-09-09 DIAGNOSIS — R102 Pelvic and perineal pain: Secondary | ICD-10-CM | POA: Diagnosis not present

## 2015-09-09 DIAGNOSIS — E538 Deficiency of other specified B group vitamins: Secondary | ICD-10-CM | POA: Diagnosis not present

## 2015-09-09 LAB — CBC
HEMATOCRIT: 39.2 % (ref 35.0–47.0)
Hemoglobin: 13.9 g/dL (ref 12.0–16.0)
MCH: 32.5 pg (ref 26.0–34.0)
MCHC: 35.4 g/dL (ref 32.0–36.0)
MCV: 91.8 fL (ref 80.0–100.0)
PLATELETS: 260 10*3/uL (ref 150–440)
RBC: 4.27 MIL/uL (ref 3.80–5.20)
RDW: 12.3 % (ref 11.5–14.5)
WBC: 5.9 10*3/uL (ref 3.6–11.0)

## 2015-09-10 ENCOUNTER — Ambulatory Visit: Payer: Medicare Other

## 2015-09-10 ENCOUNTER — Ambulatory Visit
Admission: RE | Admit: 2015-09-10 | Discharge: 2015-09-10 | Disposition: A | Discharge status: Home / Self Care | Payer: Medicare Other | Source: Ambulatory Visit | Attending: Radiation Oncology | Admitting: Radiation Oncology

## 2015-09-10 DIAGNOSIS — I509 Heart failure, unspecified: Secondary | ICD-10-CM | POA: Diagnosis not present

## 2015-09-10 DIAGNOSIS — F17210 Nicotine dependence, cigarettes, uncomplicated: Secondary | ICD-10-CM | POA: Diagnosis not present

## 2015-09-10 DIAGNOSIS — C774 Secondary and unspecified malignant neoplasm of inguinal and lower limb lymph nodes: Secondary | ICD-10-CM | POA: Diagnosis not present

## 2015-09-10 DIAGNOSIS — Z510 Encounter for antineoplastic radiation therapy: Secondary | ICD-10-CM | POA: Diagnosis not present

## 2015-09-10 DIAGNOSIS — C779 Secondary and unspecified malignant neoplasm of lymph node, unspecified: Secondary | ICD-10-CM | POA: Diagnosis not present

## 2015-09-10 DIAGNOSIS — K219 Gastro-esophageal reflux disease without esophagitis: Secondary | ICD-10-CM | POA: Diagnosis not present

## 2015-09-10 DIAGNOSIS — C439 Malignant melanoma of skin, unspecified: Secondary | ICD-10-CM | POA: Diagnosis not present

## 2015-09-11 ENCOUNTER — Ambulatory Visit
Admission: RE | Admit: 2015-09-11 | Discharge: 2015-09-11 | Disposition: A | Discharge status: Home / Self Care | Payer: Medicare Other | Source: Ambulatory Visit | Attending: Radiation Oncology | Admitting: Radiation Oncology

## 2015-09-11 ENCOUNTER — Ambulatory Visit: Payer: Medicare Other

## 2015-09-11 DIAGNOSIS — F17210 Nicotine dependence, cigarettes, uncomplicated: Secondary | ICD-10-CM | POA: Diagnosis not present

## 2015-09-11 DIAGNOSIS — I509 Heart failure, unspecified: Secondary | ICD-10-CM | POA: Diagnosis not present

## 2015-09-11 DIAGNOSIS — C439 Malignant melanoma of skin, unspecified: Secondary | ICD-10-CM | POA: Diagnosis not present

## 2015-09-11 DIAGNOSIS — Z510 Encounter for antineoplastic radiation therapy: Secondary | ICD-10-CM | POA: Diagnosis not present

## 2015-09-11 DIAGNOSIS — K219 Gastro-esophageal reflux disease without esophagitis: Secondary | ICD-10-CM | POA: Diagnosis not present

## 2015-09-11 DIAGNOSIS — C774 Secondary and unspecified malignant neoplasm of inguinal and lower limb lymph nodes: Secondary | ICD-10-CM | POA: Diagnosis not present

## 2015-09-14 ENCOUNTER — Ambulatory Visit: Payer: Medicare Other

## 2015-09-14 ENCOUNTER — Ambulatory Visit
Admission: RE | Admit: 2015-09-14 | Discharge: 2015-09-14 | Disposition: A | Discharge status: Home / Self Care | Payer: Medicare Other | Source: Ambulatory Visit | Attending: Radiation Oncology | Admitting: Radiation Oncology

## 2015-09-14 DIAGNOSIS — F17210 Nicotine dependence, cigarettes, uncomplicated: Secondary | ICD-10-CM | POA: Diagnosis not present

## 2015-09-14 DIAGNOSIS — C439 Malignant melanoma of skin, unspecified: Secondary | ICD-10-CM | POA: Diagnosis not present

## 2015-09-14 DIAGNOSIS — I509 Heart failure, unspecified: Secondary | ICD-10-CM | POA: Diagnosis not present

## 2015-09-14 DIAGNOSIS — C774 Secondary and unspecified malignant neoplasm of inguinal and lower limb lymph nodes: Secondary | ICD-10-CM | POA: Diagnosis not present

## 2015-09-14 DIAGNOSIS — K219 Gastro-esophageal reflux disease without esophagitis: Secondary | ICD-10-CM | POA: Diagnosis not present

## 2015-09-14 DIAGNOSIS — Z510 Encounter for antineoplastic radiation therapy: Secondary | ICD-10-CM | POA: Diagnosis not present

## 2015-09-15 ENCOUNTER — Ambulatory Visit: Payer: Medicare Other

## 2015-09-15 ENCOUNTER — Ambulatory Visit
Admission: RE | Admit: 2015-09-15 | Discharge: 2015-09-15 | Disposition: A | Discharge status: Home / Self Care | Payer: Medicare Other | Source: Ambulatory Visit | Attending: Radiation Oncology | Admitting: Radiation Oncology

## 2015-09-15 DIAGNOSIS — Z510 Encounter for antineoplastic radiation therapy: Secondary | ICD-10-CM | POA: Diagnosis not present

## 2015-09-15 DIAGNOSIS — C439 Malignant melanoma of skin, unspecified: Secondary | ICD-10-CM | POA: Diagnosis not present

## 2015-09-15 DIAGNOSIS — C774 Secondary and unspecified malignant neoplasm of inguinal and lower limb lymph nodes: Secondary | ICD-10-CM | POA: Diagnosis not present

## 2015-09-15 DIAGNOSIS — K219 Gastro-esophageal reflux disease without esophagitis: Secondary | ICD-10-CM | POA: Diagnosis not present

## 2015-09-15 DIAGNOSIS — F17210 Nicotine dependence, cigarettes, uncomplicated: Secondary | ICD-10-CM | POA: Diagnosis not present

## 2015-09-15 DIAGNOSIS — I509 Heart failure, unspecified: Secondary | ICD-10-CM | POA: Diagnosis not present

## 2015-09-16 ENCOUNTER — Inpatient Hospital Stay: Payer: Medicare Other

## 2015-09-16 ENCOUNTER — Ambulatory Visit: Payer: Medicare Other

## 2015-09-16 ENCOUNTER — Ambulatory Visit: Payer: Medicare Other | Admitting: Internal Medicine

## 2015-09-16 ENCOUNTER — Ambulatory Visit
Admission: RE | Admit: 2015-09-16 | Discharge: 2015-09-16 | Disposition: A | Discharge status: Home / Self Care | Payer: Medicare Other | Source: Ambulatory Visit | Attending: Radiation Oncology | Admitting: Radiation Oncology

## 2015-09-16 ENCOUNTER — Other Ambulatory Visit: Payer: Medicare Other

## 2015-09-16 DIAGNOSIS — Z510 Encounter for antineoplastic radiation therapy: Secondary | ICD-10-CM | POA: Diagnosis not present

## 2015-09-16 DIAGNOSIS — C4371 Malignant melanoma of right lower limb, including hip: Secondary | ICD-10-CM

## 2015-09-16 DIAGNOSIS — C774 Secondary and unspecified malignant neoplasm of inguinal and lower limb lymph nodes: Secondary | ICD-10-CM | POA: Diagnosis not present

## 2015-09-16 DIAGNOSIS — C439 Malignant melanoma of skin, unspecified: Secondary | ICD-10-CM | POA: Diagnosis not present

## 2015-09-16 DIAGNOSIS — C792 Secondary malignant neoplasm of skin: Secondary | ICD-10-CM | POA: Diagnosis not present

## 2015-09-16 DIAGNOSIS — F17210 Nicotine dependence, cigarettes, uncomplicated: Secondary | ICD-10-CM | POA: Diagnosis not present

## 2015-09-16 DIAGNOSIS — R102 Pelvic and perineal pain: Secondary | ICD-10-CM | POA: Diagnosis not present

## 2015-09-16 DIAGNOSIS — K219 Gastro-esophageal reflux disease without esophagitis: Secondary | ICD-10-CM | POA: Diagnosis not present

## 2015-09-16 DIAGNOSIS — I509 Heart failure, unspecified: Secondary | ICD-10-CM | POA: Diagnosis not present

## 2015-09-16 DIAGNOSIS — E538 Deficiency of other specified B group vitamins: Secondary | ICD-10-CM | POA: Diagnosis not present

## 2015-09-16 LAB — CBC WITH DIFFERENTIAL/PLATELET
BASOS ABS: 0 10*3/uL (ref 0–0.1)
Basophils Relative: 1 %
Eosinophils Absolute: 0.2 10*3/uL (ref 0–0.7)
Eosinophils Relative: 4 %
HEMATOCRIT: 39.3 % (ref 35.0–47.0)
HEMOGLOBIN: 13.8 g/dL (ref 12.0–16.0)
LYMPHS PCT: 25 %
Lymphs Abs: 1.3 10*3/uL (ref 1.0–3.6)
MCH: 32 pg (ref 26.0–34.0)
MCHC: 35.1 g/dL (ref 32.0–36.0)
MCV: 91.1 fL (ref 80.0–100.0)
Monocytes Absolute: 0.4 10*3/uL (ref 0.2–0.9)
Monocytes Relative: 8 %
NEUTROS ABS: 3.3 10*3/uL (ref 1.4–6.5)
Neutrophils Relative %: 62 %
Platelets: 258 10*3/uL (ref 150–440)
RBC: 4.32 MIL/uL (ref 3.80–5.20)
RDW: 12.4 % (ref 11.5–14.5)
WBC: 5.3 10*3/uL (ref 3.6–11.0)

## 2015-09-16 LAB — COMPREHENSIVE METABOLIC PANEL
ALT: 13 U/L — AB (ref 14–54)
AST: 20 U/L (ref 15–41)
Albumin: 4.5 g/dL (ref 3.5–5.0)
Alkaline Phosphatase: 56 U/L (ref 38–126)
Anion gap: 4 — ABNORMAL LOW (ref 5–15)
BILIRUBIN TOTAL: 0.8 mg/dL (ref 0.3–1.2)
BUN: 19 mg/dL (ref 6–20)
CO2: 29 mmol/L (ref 22–32)
CREATININE: 1.16 mg/dL — AB (ref 0.44–1.00)
Calcium: 9.1 mg/dL (ref 8.9–10.3)
Chloride: 100 mmol/L — ABNORMAL LOW (ref 101–111)
GFR, EST NON AFRICAN AMERICAN: 53 mL/min — AB (ref >60)
Glucose, Bld: 90 mg/dL (ref 65–99)
POTASSIUM: 4.1 mmol/L (ref 3.5–5.1)
Sodium: 133 mmol/L — ABNORMAL LOW (ref 135–145)
TOTAL PROTEIN: 7.5 g/dL (ref 6.5–8.1)

## 2015-09-16 LAB — LACTATE DEHYDROGENASE: LDH: 161 U/L (ref 98–192)

## 2015-09-17 ENCOUNTER — Ambulatory Visit
Admission: RE | Admit: 2015-09-17 | Discharge: 2015-09-17 | Disposition: A | Discharge status: Home / Self Care | Payer: Medicare Other | Source: Ambulatory Visit | Attending: Radiation Oncology | Admitting: Radiation Oncology

## 2015-09-17 ENCOUNTER — Telehealth: Payer: Self-pay | Admitting: *Deleted

## 2015-09-17 ENCOUNTER — Ambulatory Visit: Payer: Medicare Other

## 2015-09-17 DIAGNOSIS — C439 Malignant melanoma of skin, unspecified: Secondary | ICD-10-CM | POA: Diagnosis not present

## 2015-09-17 DIAGNOSIS — Z510 Encounter for antineoplastic radiation therapy: Secondary | ICD-10-CM | POA: Diagnosis not present

## 2015-09-17 DIAGNOSIS — C774 Secondary and unspecified malignant neoplasm of inguinal and lower limb lymph nodes: Secondary | ICD-10-CM | POA: Diagnosis not present

## 2015-09-17 DIAGNOSIS — C779 Secondary and unspecified malignant neoplasm of lymph node, unspecified: Secondary | ICD-10-CM | POA: Diagnosis not present

## 2015-09-17 DIAGNOSIS — K219 Gastro-esophageal reflux disease without esophagitis: Secondary | ICD-10-CM | POA: Diagnosis not present

## 2015-09-17 DIAGNOSIS — I509 Heart failure, unspecified: Secondary | ICD-10-CM | POA: Diagnosis not present

## 2015-09-17 DIAGNOSIS — F17210 Nicotine dependence, cigarettes, uncomplicated: Secondary | ICD-10-CM | POA: Diagnosis not present

## 2015-09-17 NOTE — Telephone Encounter (Signed)
Left msg for patient for patient to increase po fluid intake

## 2015-09-17 NOTE — Telephone Encounter (Signed)
-----   Message from Cammie Sickle, MD sent at 09/16/2015  4:30 PM EDT ----- Tillie Rung- please ask pt to increase her fluid intake; her creatinine is slightly up. Dr.B

## 2015-09-18 ENCOUNTER — Ambulatory Visit: Payer: Medicare Other

## 2015-09-18 ENCOUNTER — Ambulatory Visit
Admission: RE | Admit: 2015-09-18 | Discharge: 2015-09-18 | Disposition: A | Discharge status: Home / Self Care | Payer: Medicare Other | Source: Ambulatory Visit | Attending: Radiation Oncology | Admitting: Radiation Oncology

## 2015-09-18 ENCOUNTER — Other Ambulatory Visit: Payer: Self-pay | Admitting: *Deleted

## 2015-09-18 DIAGNOSIS — C799 Secondary malignant neoplasm of unspecified site: Secondary | ICD-10-CM

## 2015-09-18 DIAGNOSIS — K219 Gastro-esophageal reflux disease without esophagitis: Secondary | ICD-10-CM | POA: Diagnosis not present

## 2015-09-18 DIAGNOSIS — C439 Malignant melanoma of skin, unspecified: Secondary | ICD-10-CM | POA: Diagnosis not present

## 2015-09-18 DIAGNOSIS — F17210 Nicotine dependence, cigarettes, uncomplicated: Secondary | ICD-10-CM | POA: Diagnosis not present

## 2015-09-18 DIAGNOSIS — Z510 Encounter for antineoplastic radiation therapy: Secondary | ICD-10-CM | POA: Diagnosis not present

## 2015-09-18 DIAGNOSIS — I509 Heart failure, unspecified: Secondary | ICD-10-CM | POA: Diagnosis not present

## 2015-09-18 DIAGNOSIS — C774 Secondary and unspecified malignant neoplasm of inguinal and lower limb lymph nodes: Secondary | ICD-10-CM | POA: Diagnosis not present

## 2015-09-21 ENCOUNTER — Ambulatory Visit
Admission: RE | Admit: 2015-09-21 | Discharge: 2015-09-21 | Disposition: A | Discharge status: Home / Self Care | Payer: Medicare Other | Source: Ambulatory Visit | Attending: Radiation Oncology | Admitting: Radiation Oncology

## 2015-09-21 ENCOUNTER — Ambulatory Visit: Payer: Medicare Other

## 2015-09-21 ENCOUNTER — Other Ambulatory Visit: Payer: Self-pay | Admitting: *Deleted

## 2015-09-21 DIAGNOSIS — I509 Heart failure, unspecified: Secondary | ICD-10-CM | POA: Diagnosis not present

## 2015-09-21 DIAGNOSIS — C779 Secondary and unspecified malignant neoplasm of lymph node, unspecified: Secondary | ICD-10-CM | POA: Diagnosis not present

## 2015-09-21 DIAGNOSIS — C439 Malignant melanoma of skin, unspecified: Secondary | ICD-10-CM | POA: Diagnosis not present

## 2015-09-21 DIAGNOSIS — K219 Gastro-esophageal reflux disease without esophagitis: Secondary | ICD-10-CM | POA: Diagnosis not present

## 2015-09-21 DIAGNOSIS — Z510 Encounter for antineoplastic radiation therapy: Secondary | ICD-10-CM | POA: Diagnosis not present

## 2015-09-21 DIAGNOSIS — C774 Secondary and unspecified malignant neoplasm of inguinal and lower limb lymph nodes: Secondary | ICD-10-CM | POA: Diagnosis not present

## 2015-09-21 DIAGNOSIS — F17210 Nicotine dependence, cigarettes, uncomplicated: Secondary | ICD-10-CM | POA: Diagnosis not present

## 2015-09-21 MED ORDER — SUCRALFATE 1 G PO TABS: 1.0000 g | ORAL_TABLET | Freq: Three times a day (TID) | ORAL | 3 refills | Status: DC

## 2015-09-22 ENCOUNTER — Inpatient Hospital Stay: Payer: Medicare Other

## 2015-09-22 ENCOUNTER — Ambulatory Visit: Payer: Medicare Other

## 2015-09-22 ENCOUNTER — Encounter: Payer: Self-pay | Admitting: *Deleted

## 2015-09-22 ENCOUNTER — Ambulatory Visit
Admission: RE | Admit: 2015-09-22 | Discharge: 2015-09-22 | Disposition: A | Discharge status: Home / Self Care | Payer: Medicare Other | Source: Ambulatory Visit | Attending: Radiation Oncology | Admitting: Radiation Oncology

## 2015-09-22 ENCOUNTER — Inpatient Hospital Stay (HOSPITAL_BASED_OUTPATIENT_CLINIC_OR_DEPARTMENT_OTHER): Payer: Medicare Other | Admitting: Oncology

## 2015-09-22 VITALS — BP 114/77 | HR 67 | Temp 97.3°F | Resp 18 | Wt 161.7 lb

## 2015-09-22 DIAGNOSIS — C439 Malignant melanoma of skin, unspecified: Secondary | ICD-10-CM

## 2015-09-22 DIAGNOSIS — Z79899 Other long term (current) drug therapy: Secondary | ICD-10-CM

## 2015-09-22 DIAGNOSIS — C4371 Malignant melanoma of right lower limb, including hip: Secondary | ICD-10-CM

## 2015-09-22 DIAGNOSIS — I509 Heart failure, unspecified: Secondary | ICD-10-CM

## 2015-09-22 DIAGNOSIS — K219 Gastro-esophageal reflux disease without esophagitis: Secondary | ICD-10-CM | POA: Diagnosis not present

## 2015-09-22 DIAGNOSIS — M5126 Other intervertebral disc displacement, lumbar region: Secondary | ICD-10-CM

## 2015-09-22 DIAGNOSIS — R102 Pelvic and perineal pain: Secondary | ICD-10-CM

## 2015-09-22 DIAGNOSIS — Z510 Encounter for antineoplastic radiation therapy: Secondary | ICD-10-CM | POA: Diagnosis not present

## 2015-09-22 DIAGNOSIS — F17210 Nicotine dependence, cigarettes, uncomplicated: Secondary | ICD-10-CM | POA: Diagnosis not present

## 2015-09-22 DIAGNOSIS — E538 Deficiency of other specified B group vitamins: Secondary | ICD-10-CM

## 2015-09-22 DIAGNOSIS — C792 Secondary malignant neoplasm of skin: Secondary | ICD-10-CM | POA: Diagnosis not present

## 2015-09-22 DIAGNOSIS — Z800 Family history of malignant neoplasm of digestive organs: Secondary | ICD-10-CM

## 2015-09-22 DIAGNOSIS — C799 Secondary malignant neoplasm of unspecified site: Secondary | ICD-10-CM

## 2015-09-22 DIAGNOSIS — C774 Secondary and unspecified malignant neoplasm of inguinal and lower limb lymph nodes: Secondary | ICD-10-CM | POA: Diagnosis not present

## 2015-09-22 DIAGNOSIS — Z9221 Personal history of antineoplastic chemotherapy: Secondary | ICD-10-CM

## 2015-09-22 LAB — CBC WITH DIFFERENTIAL/PLATELET
BASOS ABS: 0 10*3/uL (ref 0–0.1)
Basophils Relative: 1 %
Eosinophils Absolute: 0.2 10*3/uL (ref 0–0.7)
Eosinophils Relative: 4 %
HEMATOCRIT: 37.2 % (ref 35.0–47.0)
HEMOGLOBIN: 13.3 g/dL (ref 12.0–16.0)
LYMPHS ABS: 1.1 10*3/uL (ref 1.0–3.6)
LYMPHS PCT: 22 %
MCH: 32.4 pg (ref 26.0–34.0)
MCHC: 35.7 g/dL (ref 32.0–36.0)
MCV: 90.7 fL (ref 80.0–100.0)
Monocytes Absolute: 0.4 10*3/uL (ref 0.2–0.9)
Monocytes Relative: 8 %
NEUTROS ABS: 3.4 10*3/uL (ref 1.4–6.5)
NEUTROS PCT: 65 %
Platelets: 264 10*3/uL (ref 150–440)
RBC: 4.1 MIL/uL (ref 3.80–5.20)
RDW: 12.3 % (ref 11.5–14.5)
WBC: 5.2 10*3/uL (ref 3.6–11.0)

## 2015-09-22 LAB — COMPREHENSIVE METABOLIC PANEL
ALK PHOS: 56 U/L (ref 38–126)
ALT: 15 U/L (ref 14–54)
AST: 19 U/L (ref 15–41)
Albumin: 4.3 g/dL (ref 3.5–5.0)
Anion gap: 8 (ref 5–15)
BUN: 13 mg/dL (ref 6–20)
CALCIUM: 9.5 mg/dL (ref 8.9–10.3)
CO2: 28 mmol/L (ref 22–32)
CREATININE: 1.16 mg/dL — AB (ref 0.44–1.00)
Chloride: 98 mmol/L — ABNORMAL LOW (ref 101–111)
GFR, EST NON AFRICAN AMERICAN: 53 mL/min — AB (ref >60)
Glucose, Bld: 84 mg/dL (ref 65–99)
Potassium: 3.9 mmol/L (ref 3.5–5.1)
Sodium: 134 mmol/L — ABNORMAL LOW (ref 135–145)
Total Bilirubin: 0.5 mg/dL (ref 0.3–1.2)
Total Protein: 7.4 g/dL (ref 6.5–8.1)

## 2015-09-22 NOTE — Progress Notes (Signed)
States has soreness and tenderness at surgical site. Feeling nauseated with occasional vomiting last week. Feeling better today.

## 2015-09-23 ENCOUNTER — Ambulatory Visit: Payer: Medicare Other

## 2015-09-23 ENCOUNTER — Ambulatory Visit
Admission: RE | Admit: 2015-09-23 | Discharge: 2015-09-23 | Disposition: A | Discharge status: Home / Self Care | Payer: Medicare Other | Source: Ambulatory Visit | Attending: Radiation Oncology | Admitting: Radiation Oncology

## 2015-09-23 ENCOUNTER — Inpatient Hospital Stay: Payer: Medicare Other

## 2015-09-23 DIAGNOSIS — I509 Heart failure, unspecified: Secondary | ICD-10-CM | POA: Diagnosis not present

## 2015-09-23 DIAGNOSIS — F17210 Nicotine dependence, cigarettes, uncomplicated: Secondary | ICD-10-CM | POA: Diagnosis not present

## 2015-09-23 DIAGNOSIS — K219 Gastro-esophageal reflux disease without esophagitis: Secondary | ICD-10-CM | POA: Diagnosis not present

## 2015-09-23 DIAGNOSIS — C774 Secondary and unspecified malignant neoplasm of inguinal and lower limb lymph nodes: Secondary | ICD-10-CM | POA: Diagnosis not present

## 2015-09-23 DIAGNOSIS — Z510 Encounter for antineoplastic radiation therapy: Secondary | ICD-10-CM | POA: Diagnosis not present

## 2015-09-23 DIAGNOSIS — C439 Malignant melanoma of skin, unspecified: Secondary | ICD-10-CM | POA: Diagnosis not present

## 2015-09-24 ENCOUNTER — Ambulatory Visit: Payer: Medicare Other

## 2015-09-24 ENCOUNTER — Ambulatory Visit
Admission: RE | Admit: 2015-09-24 | Discharge: 2015-09-24 | Disposition: A | Discharge status: Home / Self Care | Payer: Medicare Other | Source: Ambulatory Visit | Attending: Radiation Oncology | Admitting: Radiation Oncology

## 2015-09-24 DIAGNOSIS — C774 Secondary and unspecified malignant neoplasm of inguinal and lower limb lymph nodes: Secondary | ICD-10-CM | POA: Diagnosis not present

## 2015-09-24 DIAGNOSIS — F17210 Nicotine dependence, cigarettes, uncomplicated: Secondary | ICD-10-CM | POA: Diagnosis not present

## 2015-09-24 DIAGNOSIS — Z510 Encounter for antineoplastic radiation therapy: Secondary | ICD-10-CM | POA: Diagnosis not present

## 2015-09-24 DIAGNOSIS — I509 Heart failure, unspecified: Secondary | ICD-10-CM | POA: Diagnosis not present

## 2015-09-24 DIAGNOSIS — C779 Secondary and unspecified malignant neoplasm of lymph node, unspecified: Secondary | ICD-10-CM | POA: Diagnosis not present

## 2015-09-24 DIAGNOSIS — K219 Gastro-esophageal reflux disease without esophagitis: Secondary | ICD-10-CM | POA: Diagnosis not present

## 2015-09-24 DIAGNOSIS — C439 Malignant melanoma of skin, unspecified: Secondary | ICD-10-CM | POA: Diagnosis not present

## 2015-09-24 NOTE — Progress Notes (Signed)
[place]  [phone] [phone]) [phone]  ID: Patricia Li OB: 1960-06-11  MR#: [id number]  [id number]  Patient Care Team: Donnie Mesa, MD as PCP - General (Family Medicine) Robert Bellow, MD as Consulting Physician (General Surgery) Forest Gleason, MD (Oncology)  CHIEF COMPLAINT: Recurrent metastatic melanoma of right lower extremity.  INTERVAL HISTORY: Patient returns to clinic today for further evaluation and to establish care. She had a recent PET scan a biopsy in April 2017 which recurrent right inguinal lymphadenopathy consistent with melanoma. She recently initiated XRT. She continues to have right inguinal pain, but otherwise feels well. She has no neurological point. She denies any recent fevers or illnesses. She has a good appetite and denies weight loss. She denies any chest pain or shortness of breath. She has no nausea, vomiting, constipation, or diarrhea. She has no urinary complaints. Patient otherwise feels well and offers no further specific complaints.   REVIEW OF SYSTEMS:   Review of Systems  Constitutional: Negative.  Negative for fever, malaise/fatigue and weight loss.  Respiratory: Negative.  Negative for cough and shortness of breath.   Cardiovascular: Negative.  Negative for chest pain.  Gastrointestinal: Negative.  Negative for abdominal pain.  Genitourinary: Negative.   Musculoskeletal:       Right inguinal pain.  Neurological: Negative.  Negative for weakness.  Psychiatric/Behavioral: Negative.  The patient is not nervous/anxious.     As per HPI. Otherwise, a complete review of systems is negatve.  PAST MEDICAL HISTORY: Past Medical History:  Diagnosis Date  . CHF (congestive heart failure) ([place]) 2016   Dr. Nehemiah Massed  . GERD (gastroesophageal reflux disease) 2012  . Lumbar herniated disc 2010  . Malignant melanoma of skin of lower limb, including hip ([place]) 2008, 2017   right thigh  . Personal history of  malignant melanoma of skin 2012  . Tick bite of head 07/20/2015    PAST SURGICAL HISTORY: Past Surgical History:  Procedure Laterality Date  . BACK SURGERY  2010  . BREAST CYST EXCISION  1985  . COLONOSCOPY  2013   [facility]  . FRACTURE SURGERY     rod removed from left femur 07/21/2014  . INGUINAL LYMPH NODE BIOPSY Right 07/17/2015   Procedure: INGUINAL LYMPH NODE BIOPSY/EXCISION;  Surgeon: Robert Bellow, MD;  Location: [facility] ORS;  Service: General;  Laterality: Right;  . MELANOMA EXCISION  2008   right thigh  . SKIN LESION EXCISION Right 12-19-10   posterior    FAMILY HISTORY: Family History  Problem Relation Age of Onset  . COPD Father   . Cancer Sister     Colon, sister       ADVANCED DIRECTIVES:    HEALTH MAINTENANCE: Social History  Substance Use Topics  . Smoking status: Current Every Day Smoker    Packs/day: 0.50    Years: 20.00    Types: Cigarettes  . Smokeless tobacco: Never Used  . Alcohol use 0.0 oz/week     Comment: ocassionally     Colonoscopy:  PAP:  Bone density:  Lipid panel:  Allergies  Allergen Reactions  . Clindamycin Shortness Of Breath  . Aripiprazole Other (See Comments)    Facial and head ticks  . Valproic Acid     Other reaction(s): Other (See Comments) Facial twitching    Current Outpatient Prescriptions  Medication Sig Dispense Refill  . calcium carbonate (OS-CAL) 600 MG TABS tablet Take 600 mg by mouth 2 (two) times daily with a meal.    .  furosemide (LASIX) 20 MG tablet Take 20 mg by mouth every other day.     . hydrocortisone (CORTEF) 10 MG tablet Take 25 mg by mouth as directed.     Marland Kitchen levothyroxine (SYNTHROID, LEVOTHROID) 112 MCG tablet     . metoprolol tartrate (LOPRESSOR) 25 MG tablet Take 25 mg by mouth once.    . Multiple Vitamin (MULTI-VITAMINS) TABS Take by mouth.    . pantoprazole (PROTONIX) 40 MG tablet Take 40 mg by mouth 2 (two) times daily.     Marland Kitchen venlafaxine XR (EFFEXOR-XR) 150 MG 24 hr capsule 150 mg every  morning.      No current facility-administered medications for this visit.     OBJECTIVE: Vitals:   09/22/15 0917  BP: 114/77  Pulse: 67  Resp: 18  Temp: 97.3 F (36.3 C)     Body mass index is 25.33 kg/m.    ECOG FS:0 - Asymptomatic  General: Well-developed, well-nourished, no acute distress. Eyes: Pink conjunctiva, anicteric sclera. HEENT: Normocephalic, moist mucous membranes, clear oropharnyx. Lungs: Clear to auscultation bilaterally. Heart: Regular rate and rhythm. No rubs, murmurs, or gallops. Abdomen: Soft, nontender, nondistended. No organomegaly noted, normoactive bowel sounds. Musculoskeletal: No edema, cyanosis, or clubbing. Neuro: Alert, answering all questions appropriately. Cranial nerves grossly intact. Skin: No rashes or petechiae noted. Psych: Normal affect. Lymphatics: Easily palpable right inguinal lymphadenopathy, mildly tender.Marland Kitchen   LAB RESULTS:  Lab Results  Component Value Date   NA 134 (L) 09/22/2015   K 3.9 09/22/2015   CL 98 (L) 09/22/2015   CO2 28 09/22/2015   GLUCOSE 84 09/22/2015   BUN 13 09/22/2015   CREATININE 1.16 (H) 09/22/2015   CALCIUM 9.5 09/22/2015   PROT 7.4 09/22/2015   ALBUMIN 4.3 09/22/2015   AST 19 09/22/2015   ALT 15 09/22/2015   ALKPHOS 56 09/22/2015   BILITOT 0.5 09/22/2015   GFRNONAA 53 (L) 09/22/2015   GFRAA >60 09/22/2015    Lab Results  Component Value Date   WBC 5.2 09/22/2015   NEUTROABS 3.4 09/22/2015   HGB 13.3 09/22/2015   HCT 37.2 09/22/2015   MCV 90.7 09/22/2015   PLT 264 09/22/2015    STUDIES: No results found.  ONCOLOGIC HISTORY: Patient initially diagnosed with metastatic melanoma to right thigh with unknown primary in 2009. She received 4 weeks of high-dose interferon therapy in February 2009 and proceed with low-dose interferon therapy in March 2009. Patient remained in remission until August 2012 when she was noted to have recurrent melanoma and a pelvic lymph node. October 2012 she had 2  cycles of Ipilumimab which was discontinued secondary to poor tolerance. In April 2017 PET scan showed recurrent disease in the right inguinal region which was confirmed by biopsy.  ASSESSMENT: Recurrent metastatic melanoma of right lower extremity.  PLAN:    1. Recurrent metastatic melanoma of right lower extremity: Previously, patient had poor tolerance to ipilumimab, but in retrospect patient feels that her side effects at that time may have been secondary to new psych medicines. Patient expressed interest that if necessary she may want to retry treatment. In the meantime, continue XRT as planned with her final treatment scheduled for October 12, 2015. We will get a PET scan 1 month later to assess for resolution of disease as well as any other areas of metastatic lesions. Return to clinic 1 to 2 days after for further evaluation. 2. Pain: Patient was previously offered Vicodin which she has refused. Monitor.  Approximately 30 minutes was spent in discussion  of which greater than 50% was consultation.  Patient expressed understanding and was in agreement with this plan. She also understands that She can call clinic at any time with any questions, concerns, or complaints.   Lloyd Huger, MD   09/24/2015 10:31 AM

## 2015-09-25 ENCOUNTER — Ambulatory Visit
Admission: RE | Admit: 2015-09-25 | Discharge: 2015-09-25 | Disposition: A | Discharge status: Home / Self Care | Payer: Medicare Other | Source: Ambulatory Visit | Attending: Radiation Oncology | Admitting: Radiation Oncology

## 2015-09-25 ENCOUNTER — Ambulatory Visit: Payer: Medicare Other

## 2015-09-25 DIAGNOSIS — K219 Gastro-esophageal reflux disease without esophagitis: Secondary | ICD-10-CM | POA: Diagnosis not present

## 2015-09-25 DIAGNOSIS — C774 Secondary and unspecified malignant neoplasm of inguinal and lower limb lymph nodes: Secondary | ICD-10-CM | POA: Diagnosis not present

## 2015-09-25 DIAGNOSIS — Z510 Encounter for antineoplastic radiation therapy: Secondary | ICD-10-CM | POA: Diagnosis not present

## 2015-09-25 DIAGNOSIS — I509 Heart failure, unspecified: Secondary | ICD-10-CM | POA: Diagnosis not present

## 2015-09-25 DIAGNOSIS — F17210 Nicotine dependence, cigarettes, uncomplicated: Secondary | ICD-10-CM | POA: Diagnosis not present

## 2015-09-25 DIAGNOSIS — C439 Malignant melanoma of skin, unspecified: Secondary | ICD-10-CM | POA: Diagnosis not present

## 2015-09-28 ENCOUNTER — Ambulatory Visit
Admission: RE | Admit: 2015-09-28 | Discharge: 2015-09-28 | Disposition: A | Discharge status: Home / Self Care | Payer: Medicare Other | Source: Ambulatory Visit | Attending: Radiation Oncology | Admitting: Radiation Oncology

## 2015-09-28 ENCOUNTER — Ambulatory Visit: Payer: Medicare Other

## 2015-09-28 DIAGNOSIS — C774 Secondary and unspecified malignant neoplasm of inguinal and lower limb lymph nodes: Secondary | ICD-10-CM | POA: Diagnosis not present

## 2015-09-28 DIAGNOSIS — F17210 Nicotine dependence, cigarettes, uncomplicated: Secondary | ICD-10-CM | POA: Diagnosis not present

## 2015-09-28 DIAGNOSIS — I509 Heart failure, unspecified: Secondary | ICD-10-CM | POA: Diagnosis not present

## 2015-09-28 DIAGNOSIS — C439 Malignant melanoma of skin, unspecified: Secondary | ICD-10-CM | POA: Diagnosis not present

## 2015-09-28 DIAGNOSIS — Z510 Encounter for antineoplastic radiation therapy: Secondary | ICD-10-CM | POA: Diagnosis not present

## 2015-09-28 DIAGNOSIS — K219 Gastro-esophageal reflux disease without esophagitis: Secondary | ICD-10-CM | POA: Diagnosis not present

## 2015-09-29 ENCOUNTER — Ambulatory Visit: Payer: Medicare Other

## 2015-09-29 ENCOUNTER — Ambulatory Visit
Admission: RE | Admit: 2015-09-29 | Discharge: 2015-09-29 | Disposition: A | Discharge status: Home / Self Care | Payer: Medicare Other | Source: Ambulatory Visit | Attending: Radiation Oncology | Admitting: Radiation Oncology

## 2015-09-29 DIAGNOSIS — I509 Heart failure, unspecified: Secondary | ICD-10-CM | POA: Diagnosis not present

## 2015-09-29 DIAGNOSIS — F17210 Nicotine dependence, cigarettes, uncomplicated: Secondary | ICD-10-CM | POA: Diagnosis not present

## 2015-09-29 DIAGNOSIS — K219 Gastro-esophageal reflux disease without esophagitis: Secondary | ICD-10-CM | POA: Diagnosis not present

## 2015-09-29 DIAGNOSIS — C439 Malignant melanoma of skin, unspecified: Secondary | ICD-10-CM | POA: Diagnosis not present

## 2015-09-29 DIAGNOSIS — C779 Secondary and unspecified malignant neoplasm of lymph node, unspecified: Secondary | ICD-10-CM | POA: Diagnosis not present

## 2015-09-29 DIAGNOSIS — C774 Secondary and unspecified malignant neoplasm of inguinal and lower limb lymph nodes: Secondary | ICD-10-CM | POA: Diagnosis not present

## 2015-09-29 DIAGNOSIS — Z510 Encounter for antineoplastic radiation therapy: Secondary | ICD-10-CM | POA: Diagnosis not present

## 2015-09-30 ENCOUNTER — Ambulatory Visit: Payer: Medicare Other

## 2015-09-30 ENCOUNTER — Ambulatory Visit
Admission: RE | Admit: 2015-09-30 | Discharge: 2015-09-30 | Disposition: A | Discharge status: Home / Self Care | Payer: Medicare Other | Source: Ambulatory Visit | Attending: Radiation Oncology | Admitting: Radiation Oncology

## 2015-09-30 DIAGNOSIS — C439 Malignant melanoma of skin, unspecified: Secondary | ICD-10-CM | POA: Diagnosis not present

## 2015-09-30 DIAGNOSIS — K219 Gastro-esophageal reflux disease without esophagitis: Secondary | ICD-10-CM | POA: Diagnosis not present

## 2015-09-30 DIAGNOSIS — Z510 Encounter for antineoplastic radiation therapy: Secondary | ICD-10-CM | POA: Diagnosis not present

## 2015-09-30 DIAGNOSIS — F17210 Nicotine dependence, cigarettes, uncomplicated: Secondary | ICD-10-CM | POA: Diagnosis not present

## 2015-09-30 DIAGNOSIS — I509 Heart failure, unspecified: Secondary | ICD-10-CM | POA: Diagnosis not present

## 2015-09-30 DIAGNOSIS — C774 Secondary and unspecified malignant neoplasm of inguinal and lower limb lymph nodes: Secondary | ICD-10-CM | POA: Diagnosis not present

## 2015-10-01 ENCOUNTER — Ambulatory Visit
Admission: RE | Admit: 2015-10-01 | Discharge: 2015-10-01 | Disposition: A | Discharge status: Home / Self Care | Payer: Medicare Other | Source: Ambulatory Visit | Attending: Radiation Oncology | Admitting: Radiation Oncology

## 2015-10-01 ENCOUNTER — Ambulatory Visit: Payer: Medicare Other

## 2015-10-01 DIAGNOSIS — I509 Heart failure, unspecified: Secondary | ICD-10-CM | POA: Diagnosis not present

## 2015-10-01 DIAGNOSIS — K219 Gastro-esophageal reflux disease without esophagitis: Secondary | ICD-10-CM | POA: Diagnosis not present

## 2015-10-01 DIAGNOSIS — C774 Secondary and unspecified malignant neoplasm of inguinal and lower limb lymph nodes: Secondary | ICD-10-CM | POA: Diagnosis not present

## 2015-10-01 DIAGNOSIS — F17210 Nicotine dependence, cigarettes, uncomplicated: Secondary | ICD-10-CM | POA: Diagnosis not present

## 2015-10-01 DIAGNOSIS — C779 Secondary and unspecified malignant neoplasm of lymph node, unspecified: Secondary | ICD-10-CM | POA: Diagnosis not present

## 2015-10-01 DIAGNOSIS — C439 Malignant melanoma of skin, unspecified: Secondary | ICD-10-CM | POA: Diagnosis not present

## 2015-10-01 DIAGNOSIS — Z510 Encounter for antineoplastic radiation therapy: Secondary | ICD-10-CM | POA: Diagnosis not present

## 2015-10-02 ENCOUNTER — Ambulatory Visit
Admission: RE | Admit: 2015-10-02 | Discharge: 2015-10-02 | Disposition: A | Discharge status: Home / Self Care | Payer: Medicare Other | Source: Ambulatory Visit | Attending: Radiation Oncology | Admitting: Radiation Oncology

## 2015-10-02 ENCOUNTER — Ambulatory Visit: Payer: Medicare Other

## 2015-10-02 DIAGNOSIS — C774 Secondary and unspecified malignant neoplasm of inguinal and lower limb lymph nodes: Secondary | ICD-10-CM | POA: Diagnosis not present

## 2015-10-02 DIAGNOSIS — Z510 Encounter for antineoplastic radiation therapy: Secondary | ICD-10-CM | POA: Diagnosis not present

## 2015-10-02 DIAGNOSIS — F17210 Nicotine dependence, cigarettes, uncomplicated: Secondary | ICD-10-CM | POA: Diagnosis not present

## 2015-10-02 DIAGNOSIS — I509 Heart failure, unspecified: Secondary | ICD-10-CM | POA: Diagnosis not present

## 2015-10-02 DIAGNOSIS — C439 Malignant melanoma of skin, unspecified: Secondary | ICD-10-CM | POA: Diagnosis not present

## 2015-10-02 DIAGNOSIS — K219 Gastro-esophageal reflux disease without esophagitis: Secondary | ICD-10-CM | POA: Diagnosis not present

## 2015-10-02 LAB — SURGICAL PATHOLOGY

## 2015-10-05 ENCOUNTER — Ambulatory Visit: Payer: Medicare Other

## 2015-10-05 ENCOUNTER — Other Ambulatory Visit: Payer: Self-pay | Admitting: *Deleted

## 2015-10-05 DIAGNOSIS — C774 Secondary and unspecified malignant neoplasm of inguinal and lower limb lymph nodes: Secondary | ICD-10-CM | POA: Diagnosis not present

## 2015-10-05 DIAGNOSIS — K219 Gastro-esophageal reflux disease without esophagitis: Secondary | ICD-10-CM | POA: Diagnosis not present

## 2015-10-05 DIAGNOSIS — Z510 Encounter for antineoplastic radiation therapy: Secondary | ICD-10-CM | POA: Diagnosis not present

## 2015-10-05 DIAGNOSIS — F17210 Nicotine dependence, cigarettes, uncomplicated: Secondary | ICD-10-CM | POA: Diagnosis not present

## 2015-10-05 DIAGNOSIS — I509 Heart failure, unspecified: Secondary | ICD-10-CM | POA: Diagnosis not present

## 2015-10-05 DIAGNOSIS — C439 Malignant melanoma of skin, unspecified: Secondary | ICD-10-CM | POA: Diagnosis not present

## 2015-10-05 MED ORDER — PROCHLORPERAZINE MALEATE 10 MG PO TABS: 10.0000 mg | ORAL_TABLET | Freq: Four times a day (QID) | ORAL | 2 refills | Status: DC | PRN

## 2015-10-06 ENCOUNTER — Other Ambulatory Visit: Payer: Self-pay | Admitting: *Deleted

## 2015-10-06 ENCOUNTER — Telehealth: Payer: Self-pay | Admitting: *Deleted

## 2015-10-06 ENCOUNTER — Ambulatory Visit: Payer: Medicare Other

## 2015-10-06 MED ORDER — SILVER SULFADIAZINE 1 % EX CREA: 1.0000 "application " | TOPICAL_CREAM | Freq: Two times a day (BID) | CUTANEOUS | 0 refills | Status: DC

## 2015-10-06 NOTE — Telephone Encounter (Signed)
Complaining of abd down into her bladder. Asking for pain med. States Dr Baruch Gouty gave her some Silvadene for her groin flds. I spoke with Dr Baruch Gouty who stated she did not mention abd pain when she called half an hour ago. He states she could have a bladder infection and if she is exhibiting signs of bladder infection to call in Cipro for her and check UA.   I spoke with pt who denies symptoms of UTI. States there is soreness across her entire lower abd from waist down and that she is having regular BM's She states she will just use the Silvadene and get her groin healed which is causing her a great deal of pain right now and maybe the lower abd will not bother her so much when that is better. I advised her to contact us back if pain persists, or if she develops abd swelling. She stated she will if it happens.

## 2015-10-07 ENCOUNTER — Ambulatory Visit: Payer: Medicare Other

## 2015-10-08 ENCOUNTER — Telehealth: Payer: Self-pay | Admitting: *Deleted

## 2015-10-08 ENCOUNTER — Ambulatory Visit: Payer: Medicare Other

## 2015-10-08 ENCOUNTER — Other Ambulatory Visit: Payer: Self-pay | Admitting: Hematology and Oncology

## 2015-10-08 MED ORDER — HYDROCODONE-ACETAMINOPHEN 5-325 MG PO TABS: 1.0000 | ORAL_TABLET | Freq: Four times a day (QID) | ORAL | 0 refills | Status: DC | PRN

## 2015-10-08 NOTE — Telephone Encounter (Signed)
Patient called to request pain medication for rt groin pain. She states that Dr. Baruch Gouty has told her the pain is not related to her radiation treatment.

## 2015-10-08 NOTE — Telephone Encounter (Signed)
Patient states when the Vicodin was offered to her before, she was not having pain requiring it so she refused it, She is now willing to take it as she has been hurting for a week.

## 2015-10-08 NOTE — Telephone Encounter (Signed)
  I wrote an Rx for Lortab 5/325  M

## 2015-10-09 ENCOUNTER — Ambulatory Visit: Payer: Medicare Other

## 2015-10-09 MED ORDER — HYDROCODONE-ACETAMINOPHEN 5-325 MG PO TABS: 1.0000 | ORAL_TABLET | Freq: Four times a day (QID) | ORAL | 0 refills | Status: DC | PRN

## 2015-10-12 ENCOUNTER — Ambulatory Visit: Payer: Medicare Other

## 2015-10-12 DIAGNOSIS — L239 Allergic contact dermatitis, unspecified cause: Secondary | ICD-10-CM | POA: Diagnosis not present

## 2015-10-13 DIAGNOSIS — L237 Allergic contact dermatitis due to plants, except food: Secondary | ICD-10-CM | POA: Diagnosis not present

## 2015-10-13 DIAGNOSIS — L501 Idiopathic urticaria: Secondary | ICD-10-CM | POA: Diagnosis not present

## 2015-10-19 DIAGNOSIS — L239 Allergic contact dermatitis, unspecified cause: Secondary | ICD-10-CM | POA: Insufficient documentation

## 2015-10-29 ENCOUNTER — Encounter: Payer: Self-pay | Admitting: Pathology

## 2015-11-04 ENCOUNTER — Ambulatory Visit: Payer: Self-pay | Admitting: Internal Medicine

## 2015-11-04 ENCOUNTER — Other Ambulatory Visit: Payer: Self-pay

## 2015-11-11 ENCOUNTER — Ambulatory Visit
Admission: RE | Admit: 2015-11-11 | Discharge: 2015-11-11 | Disposition: A | Discharge status: Home / Self Care | Payer: Medicare Other | Source: Ambulatory Visit | Attending: Oncology | Admitting: Oncology

## 2015-11-11 DIAGNOSIS — R590 Localized enlarged lymph nodes: Secondary | ICD-10-CM | POA: Insufficient documentation

## 2015-11-11 DIAGNOSIS — C799 Secondary malignant neoplasm of unspecified site: Secondary | ICD-10-CM | POA: Diagnosis not present

## 2015-11-11 DIAGNOSIS — C439 Malignant melanoma of skin, unspecified: Secondary | ICD-10-CM

## 2015-11-11 LAB — GLUCOSE, CAPILLARY: GLUCOSE-CAPILLARY: 79 mg/dL (ref 65–99)

## 2015-11-11 MED ORDER — FLUDEOXYGLUCOSE F - 18 (FDG) INJECTION
12.0000 | Freq: Once | INTRAVENOUS | Status: AC | PRN
  Administered 2015-11-11: 12.01 via INTRAVENOUS

## 2015-11-16 NOTE — Progress Notes (Signed)
[place]  Telephone:([phone] [phone]  ID: [name] OB: 1961-02-05  MR#: [id number]  DTO#:[id number]  Patient Care Team: Donnie Mesa, MD as PCP - General (Family Medicine) Robert Bellow, MD as Consulting Physician (General Surgery) Forest Gleason, MD (Oncology)  CHIEF COMPLAINT: Recurrent metastatic melanoma, BRAF+, of right lower extremity.  INTERVAL HISTORY: Patient returns to clinic today for further evaluation, discussion of her PET scan results, and treatment planning. She continues to have right inguinal pain, but otherwise feels well. She has no neurological complaints. She denies any recent fevers or illnesses. She has a good appetite and denies weight loss. She denies any chest pain or shortness of breath. She has no nausea, vomiting, constipation, or diarrhea. She has no urinary complaints. Patient otherwise feels well and offers no further specific complaints.   REVIEW OF SYSTEMS:   Review of Systems  Constitutional: Negative.  Negative for fever, malaise/fatigue and weight loss.  Respiratory: Negative.  Negative for cough and shortness of breath.   Cardiovascular: Negative.  Negative for chest pain.  Gastrointestinal: Negative.  Negative for abdominal pain.  Genitourinary: Negative.   Musculoskeletal:       Right inguinal pain.  Neurological: Negative.  Negative for weakness.  Psychiatric/Behavioral: Negative.  The patient is not nervous/anxious.     As per HPI. Otherwise, a complete review of systems is negative.  PAST MEDICAL HISTORY: Past Medical History:  Diagnosis Date  . CHF (congestive heart failure) ([place]) 2016   Dr. Nehemiah Massed  . GERD (gastroesophageal reflux disease) 2012  . Lumbar herniated disc 2010  . Malignant melanoma of skin of lower limb, including hip ([place]) 2008, 2017   right thigh  . Personal history of malignant melanoma of skin 2012  . Tick bite of head 07/20/2015    PAST SURGICAL  HISTORY: Past Surgical History:  Procedure Laterality Date  . BACK SURGERY  2010  . BREAST CYST EXCISION  1985  . COLONOSCOPY  2013   [facility]  . FRACTURE SURGERY     rod removed from left femur 07/21/2014  . INGUINAL LYMPH NODE BIOPSY Right 07/17/2015   Procedure: INGUINAL LYMPH NODE BIOPSY/EXCISION;  Surgeon: Robert Bellow, MD;  Location: [facility] ORS;  Service: General;  Laterality: Right;  . MELANOMA EXCISION  2008   right thigh  . SKIN LESION EXCISION Right 12-19-10   posterior    FAMILY HISTORY: Family History  Problem Relation Age of Onset  . COPD Father   . Cancer Sister     Colon, sister       ADVANCED DIRECTIVES:    HEALTH MAINTENANCE: Social History  Substance Use Topics  . Smoking status: Current Every Day Smoker    Packs/day: 0.50    Years: 20.00    Types: Cigarettes  . Smokeless tobacco: Never Used  . Alcohol use 0.0 oz/week     Comment: ocassionally     Colonoscopy:  PAP:  Bone density:  Lipid panel:  Allergies  Allergen Reactions  . Clindamycin Shortness Of Breath  . Aripiprazole Other (See Comments)    Facial and head ticks  . Valproic Acid     Other reaction(s): Other (See Comments) Facial twitching    Current Outpatient Prescriptions  Medication Sig Dispense Refill  . calcium carbonate (OS-CAL) 600 MG TABS tablet Take 600 mg by mouth 2 (two) times daily with a meal.    . clonazePAM (KLONOPIN) 1 MG tablet Take 1 mg by mouth at bedtime.    . furosemide (  LASIX) 20 MG tablet Take 20 mg by mouth every other day.     Marland Kitchen HYDROcodone-acetaminophen (NORCO/VICODIN) 5-325 MG tablet Take 1 tablet by mouth every 4 (four) hours as needed for moderate pain. 60 tablet 0  . hydrocortisone (CORTEF) 10 MG tablet Take 25 mg by mouth as directed.     Marland Kitchen levothyroxine (SYNTHROID, LEVOTHROID) 112 MCG tablet     . metoprolol tartrate (LOPRESSOR) 25 MG tablet Take 25 mg by mouth once.    . Multiple Vitamin (MULTI-VITAMINS) TABS Take by mouth.    . pantoprazole  (PROTONIX) 40 MG tablet Take 40 mg by mouth 2 (two) times daily.     . prochlorperazine (COMPAZINE) 10 MG tablet Take 1 tablet (10 mg total) by mouth every 6 (six) hours as needed for nausea or vomiting. 30 tablet 2  . venlafaxine XR (EFFEXOR-XR) 150 MG 24 hr capsule 150 mg every morning.     . ondansetron (ZOFRAN) 4 MG tablet Take 1 tablet (4 mg total) by mouth every 8 (eight) hours as needed for nausea or vomiting. 30 tablet 2  . vemurafenib (ZELBORAF) 240 MG tablet Take 4 tablets (960 mg total) by mouth every 12 (twelve) hours. Take with water. 240 tablet 2   No current facility-administered medications for this visit.     OBJECTIVE: Vitals:   11/18/15 1011  BP: 134/75  Pulse: 73  Resp: 18  Temp: (!) 96.4 F (35.8 C)     Body mass index is 24.67 kg/m.    ECOG FS:0 - Asymptomatic  General: Well-developed, well-nourished, no acute distress. Eyes: Pink conjunctiva, anicteric sclera. HEENT: Normocephalic, moist mucous membranes, clear oropharnyx. Lungs: Clear to auscultation bilaterally. Heart: Regular rate and rhythm. No rubs, murmurs, or gallops. Abdomen: Soft, nontender, nondistended. No organomegaly noted, normoactive bowel sounds. Musculoskeletal: No edema, cyanosis, or clubbing. Neuro: Alert, answering all questions appropriately. Cranial nerves grossly intact. Skin: No rashes or petechiae noted. Psych: Normal affect. Lymphatics: Easily palpable right inguinal lymphadenopathy, mildly tender.   LAB RESULTS:  Lab Results  Component Value Date   NA 135 11/18/2015   K 3.9 11/18/2015   CL 105 11/18/2015   CO2 24 11/18/2015   GLUCOSE 85 11/18/2015   BUN 21 (H) 11/18/2015   CREATININE 1.01 (H) 11/18/2015   CALCIUM 8.7 (L) 11/18/2015   PROT 7.3 11/18/2015   ALBUMIN 3.8 11/18/2015   AST 17 11/18/2015   ALT 11 (L) 11/18/2015   ALKPHOS 67 11/18/2015   BILITOT 0.5 11/18/2015   GFRNONAA >60 11/18/2015   GFRAA >60 11/18/2015    Lab Results  Component Value Date   WBC  10.2 11/18/2015   NEUTROABS 9.1 (H) 11/18/2015   HGB 12.7 11/18/2015   HCT 36.5 11/18/2015   MCV 91.7 11/18/2015   PLT 350 11/18/2015    STUDIES: Nm Pet Image Restage (ps) Whole Body  Result Date: 11/11/2015 CLINICAL DATA:  Subsequent treatment strategy for metastatic melanoma EXAM: NUCLEAR MEDICINE PET WHOLE BODY TECHNIQUE: 12.0 mCi F-18 FDG was injected intravenously. Full-ring PET imaging was performed from the vertex to the feet after the radiotracer. CT data was obtained and used for attenuation correction and anatomic localization. FASTING BLOOD GLUCOSE:  Value:  79 mg/dl COMPARISON:  PET-CT 06/22/2015 and CT scan 08/19/2015 FINDINGS: Head/Neck: No hypermetabolic lymph nodes in the neck. Chest: No hypermetabolic mediastinal or hilar nodes. No suspicious pulmonary nodules on the CT scan. Abdomen/Pelvis: Hypermetabolic lymphadenopathy is demonstrated in the retroperitoneum. Several small right-sided retroperitoneal lymph nodes are hypermetabolic with SUV max  of 4.1. The largest node is just above the iliac crest and just anterior to the psoas muscle on image number 235 and measures 9 mm. SUV max is 3.3. This measures 7 mm on the prior CT scan. Bulky external iliac lymph nodes on the right side are again demonstrated. The largest node measures 33 mm on image number 273 and previously measured 22 mm. SUV max is 21.3 Separate operator lymph node on image number 273 measures 15.5 mm and previously measured 10 mm. SUV max is 8.3. Enlarged hypermetabolic inguinal lymph node on image number 293 measures 24.5 mm and SUV max is 17.9. This lesion was present on the prior PET-CT from April 2017 and had SUV max of 8.3. It measured 30 mm. Right-sided lymph node just anterior to the acetabulum on image 285 measures 15 mm and previously measured 10 mm. SUV max is 16.3 No left-sided lymphadenopathy or hypermetabolism in the pelvis or inguinal area. Skeleton: No focal hypermetabolic activity to suggest skeletal  metastasis. Evidence of a prior left femoral intramedullary rod. Extremities: No hypermetabolic activity to suggest metastasis. IMPRESSION: 1. Enlarging hypermetabolic right-sided retroperitoneal, common iliac, external iliac and right inguinal adenopathy consistent with progressive metastatic melanoma. 2. No findings for metastatic disease involving the chest or solid abdominal organs. 3. No findings for metastatic disease involving the subcutaneous tissues for bony structures. Electronically Signed   By: Marijo Sanes M.D.   On: 11/11/2015 10:49    ONCOLOGIC HISTORY: Patient initially diagnosed with metastatic melanoma to right thigh with unknown primary in 2009. She received 4 weeks of high-dose interferon therapy in February 2009 and proceed with low-dose interferon therapy in March 2009. Patient remained in remission until August 2012 when she was noted to have recurrent melanoma and a pelvic lymph node. October 2012 she had 2 cycles of Ipilumimab which was discontinued secondary to poor tolerance. In April 2017 PET scan showed recurrent disease in the right inguinal region which was confirmed by biopsy.  ASSESSMENT: Recurrent metastatic melanoma, BRAF+, of right lower extremity.  PLAN:    1. Recurrent metastatic melanoma, BRAF+, of right lower extremity:  PET scan results reviewed independently and reported as above with progression of disease. Because she is BRAF+ will pursue treatment using Zelboraf 913m every 12 hours.  Previously, patient had poor tolerance to ipilumimab, but in retrospect patient feels that her side effects at that time may have been secondary to new psych medicines. Can retry combination immunotherapy in the future if necessary. Return to clinic approximately 2 weeks after initiating treatment for laboratory work and further evaluation. Patient will then follow-up monthly for laboratory work and evaluation and will reimage with PET scan in approximately 3 months. 2. Pain:  Patient was given a prescription for Vicodin today to use as needed. 3. Nausea: Continue Compazine as needed.  Approximately 30 minutes was spent in discussion of which greater than 50% was consultation.  Patient expressed understanding and was in agreement with this plan. She also understands that She can call clinic at any time with any questions, concerns, or complaints.   TLloyd Huger MD   11/18/2015 1:03 PM

## 2015-11-18 ENCOUNTER — Ambulatory Visit: Admission: RE | Admit: 2015-11-18 | Payer: Medicare Other | Source: Ambulatory Visit | Admitting: Radiation Oncology

## 2015-11-18 ENCOUNTER — Inpatient Hospital Stay: Payer: Medicare Other | Attending: Oncology | Admitting: Oncology

## 2015-11-18 ENCOUNTER — Ambulatory Visit: Payer: Medicare Other

## 2015-11-18 ENCOUNTER — Other Ambulatory Visit: Payer: Self-pay

## 2015-11-18 VITALS — BP 134/75 | HR 73 | Temp 96.4°F | Resp 18 | Wt 157.5 lb

## 2015-11-18 DIAGNOSIS — M5126 Other intervertebral disc displacement, lumbar region: Secondary | ICD-10-CM

## 2015-11-18 DIAGNOSIS — C4371 Malignant melanoma of right lower limb, including hip: Secondary | ICD-10-CM | POA: Diagnosis not present

## 2015-11-18 DIAGNOSIS — I209 Angina pectoris, unspecified: Secondary | ICD-10-CM | POA: Diagnosis not present

## 2015-11-18 DIAGNOSIS — Z85828 Personal history of other malignant neoplasm of skin: Secondary | ICD-10-CM | POA: Diagnosis not present

## 2015-11-18 DIAGNOSIS — R110 Nausea: Secondary | ICD-10-CM | POA: Diagnosis not present

## 2015-11-18 DIAGNOSIS — F17210 Nicotine dependence, cigarettes, uncomplicated: Secondary | ICD-10-CM | POA: Diagnosis not present

## 2015-11-18 DIAGNOSIS — Z79899 Other long term (current) drug therapy: Secondary | ICD-10-CM | POA: Diagnosis not present

## 2015-11-18 DIAGNOSIS — Z800 Family history of malignant neoplasm of digestive organs: Secondary | ICD-10-CM

## 2015-11-18 DIAGNOSIS — K219 Gastro-esophageal reflux disease without esophagitis: Secondary | ICD-10-CM | POA: Diagnosis not present

## 2015-11-18 LAB — COMPREHENSIVE METABOLIC PANEL
ALK PHOS: 67 U/L (ref 38–126)
ALT: 11 U/L — AB (ref 14–54)
AST: 17 U/L (ref 15–41)
Albumin: 3.8 g/dL (ref 3.5–5.0)
Anion gap: 6 (ref 5–15)
BUN: 21 mg/dL — AB (ref 6–20)
CALCIUM: 8.7 mg/dL — AB (ref 8.9–10.3)
CO2: 24 mmol/L (ref 22–32)
CREATININE: 1.01 mg/dL — AB (ref 0.44–1.00)
Chloride: 105 mmol/L (ref 101–111)
Glucose, Bld: 85 mg/dL (ref 65–99)
Potassium: 3.9 mmol/L (ref 3.5–5.1)
SODIUM: 135 mmol/L (ref 135–145)
Total Bilirubin: 0.5 mg/dL (ref 0.3–1.2)
Total Protein: 7.3 g/dL (ref 6.5–8.1)

## 2015-11-18 LAB — CBC WITH DIFFERENTIAL/PLATELET
Basophils Absolute: 0.1 10*3/uL (ref 0–0.1)
Basophils Relative: 1 %
Eosinophils Absolute: 0.1 10*3/uL (ref 0–0.7)
Eosinophils Relative: 1 %
HCT: 36.5 % (ref 35.0–47.0)
HEMOGLOBIN: 12.7 g/dL (ref 12.0–16.0)
LYMPHS ABS: 0.7 10*3/uL — AB (ref 1.0–3.6)
LYMPHS PCT: 6 %
MCH: 31.8 pg (ref 26.0–34.0)
MCHC: 34.7 g/dL (ref 32.0–36.0)
MCV: 91.7 fL (ref 80.0–100.0)
Monocytes Absolute: 0.3 10*3/uL (ref 0.2–0.9)
Monocytes Relative: 3 %
NEUTROS PCT: 89 %
Neutro Abs: 9.1 10*3/uL — ABNORMAL HIGH (ref 1.4–6.5)
Platelets: 350 10*3/uL (ref 150–440)
RBC: 3.99 MIL/uL (ref 3.80–5.20)
RDW: 12.8 % (ref 11.5–14.5)
WBC: 10.2 10*3/uL (ref 3.6–11.0)

## 2015-11-18 MED ORDER — ONDANSETRON HCL 4 MG PO TABS: 4.0000 mg | ORAL_TABLET | Freq: Three times a day (TID) | ORAL | 2 refills | Status: DC | PRN

## 2015-11-18 MED ORDER — HYDROCODONE-ACETAMINOPHEN 5-325 MG PO TABS: 1.0000 | ORAL_TABLET | ORAL | 0 refills | Status: DC | PRN

## 2015-11-18 MED ORDER — PROCHLORPERAZINE MALEATE 10 MG PO TABS: 10.0000 mg | ORAL_TABLET | Freq: Four times a day (QID) | ORAL | 2 refills | Status: DC | PRN

## 2015-11-18 MED ORDER — VEMURAFENIB 240 MG PO TABS: 960.0000 mg | ORAL_TABLET | Freq: Two times a day (BID) | ORAL | 2 refills | Status: DC

## 2015-11-18 NOTE — Progress Notes (Signed)
States is having more consistent pain groin area.

## 2015-11-23 ENCOUNTER — Telehealth: Payer: Self-pay | Admitting: *Deleted

## 2015-11-23 ENCOUNTER — Encounter: Payer: Self-pay | Admitting: *Deleted

## 2015-11-23 NOTE — Patient Instructions (Signed)
Patient called in today to report she received her Zelboraf and is to begin medication today. Patient was seen in clinic by Dr. Grayland Ormond on 9/20. Dr. Grayland Ormond discussed treatment with Zelboraf including dosage, administration and side effects. Patient was given written educational material regarding Zelboraf. Patient will have follow up appointment scheduled in 2 weeks for lab visit and follow up with Dr. Grayland Ormond.

## 2015-11-23 NOTE — Telephone Encounter (Signed)
Pt received medication and started taking today.

## 2015-11-23 NOTE — Telephone Encounter (Signed)
Ok, please schedule f/u with lab in about 2 weeks.

## 2015-12-02 ENCOUNTER — Telehealth: Payer: Self-pay | Admitting: *Deleted

## 2015-12-02 NOTE — Telephone Encounter (Signed)
Pt called to notify MD of side effects from oral chemo. States is having redness/itching to nose with inflammation in nasal passage. Rash on trunk of body that does not itch. Scalp itchiness. Intermittent nausea, diarrhea. Pt states that she is uncomfortable but her symptoms are under control at this time. Next follow up is scheduled for 12/09/15.

## 2015-12-08 NOTE — Progress Notes (Signed)
[place]  [phone] [phone]  ID: Patricia Li OB: April 25, 1960  MR#: [id number]  HFS#:[id number]  Patient Care Team: Donnie Mesa, MD as PCP - General (Family Medicine) Robert Bellow, MD as Consulting Physician (General Surgery) Forest Gleason, MD (Oncology)  CHIEF COMPLAINT: Recurrent metastatic melanoma, BRAF+, of right lower extremity.  INTERVAL HISTORY: Patient returns to clinic today for further evaluation and to assess her toleration of Zelboraf. She is having multiple side effects including rash, difficulty sleeping, and diarrhea. She also had mild red blood per rectum approximately one week ago. She has no neurological complaints. She denies any recent fevers or illnesses. She has a good appetite and denies weight loss. She denies any chest pain or shortness of breath. She has no nausea, vomiting, or constipation. She has no urinary complaints. Patient otherwise feels well and offers no further specific complaints.   REVIEW OF SYSTEMS:   Review of Systems  Constitutional: Negative.  Negative for fever, malaise/fatigue and weight loss.  HENT: Positive for ear pain.   Eyes: Negative.   Respiratory: Positive for shortness of breath. Negative for cough.   Cardiovascular: Negative.  Negative for chest pain.  Gastrointestinal: Positive for diarrhea. Negative for abdominal pain.  Genitourinary: Negative.   Musculoskeletal:       Right inguinal pain.  Skin: Positive for itching and rash.  Neurological: Negative.  Negative for weakness.  Endo/Heme/Allergies: Does not bruise/bleed easily.  Psychiatric/Behavioral: The patient has insomnia. The patient is not nervous/anxious.     As per HPI. Otherwise, a complete review of systems is negative.  PAST MEDICAL HISTORY: Past Medical History:  Diagnosis Date  . CHF (congestive heart failure) ([place]) 2016   Dr. Nehemiah Massed  . GERD (gastroesophageal reflux disease) 2012  . Lumbar  herniated disc 2010  . Malignant melanoma of skin of lower limb, including hip ([place]) 2008, 2017   right thigh  . Personal history of malignant melanoma of skin 2012  . Tick bite of head 07/20/2015    PAST SURGICAL HISTORY: Past Surgical History:  Procedure Laterality Date  . BACK SURGERY  2010  . BREAST CYST EXCISION  1985  . COLONOSCOPY  2013   [facility]  . FRACTURE SURGERY     rod removed from left femur 07/21/2014  . INGUINAL LYMPH NODE BIOPSY Right 07/17/2015   Procedure: INGUINAL LYMPH NODE BIOPSY/EXCISION;  Surgeon: Robert Bellow, MD;  Location: [facility] ORS;  Service: General;  Laterality: Right;  . MELANOMA EXCISION  2008   right thigh  . SKIN LESION EXCISION Right 12-19-10   posterior    FAMILY HISTORY: Family History  Problem Relation Age of Onset  . COPD Father   . Cancer Sister     Colon, sister       ADVANCED DIRECTIVES:    HEALTH MAINTENANCE: Social History  Substance Use Topics  . Smoking status: Current Every Day Smoker    Packs/day: 0.50    Years: 20.00    Types: Cigarettes  . Smokeless tobacco: Never Used  . Alcohol use 0.0 oz/week     Comment: ocassionally     Colonoscopy:  PAP:  Bone density:  Lipid panel:  Allergies  Allergen Reactions  . Clindamycin Shortness Of Breath  . Aripiprazole Other (See Comments)    Facial and head ticks  . Valproic Acid     Other reaction(s): Other (See Comments) Facial twitching    Current Outpatient Prescriptions  Medication Sig Dispense Refill  . calcium carbonate (OS-CAL) 600  MG TABS tablet Take 600 mg by mouth 2 (two) times daily with a meal.    . clonazePAM (KLONOPIN) 1 MG tablet Take 1 mg by mouth at bedtime.    . furosemide (LASIX) 20 MG tablet Take 20 mg by mouth every other day.     Marland Kitchen HYDROcodone-acetaminophen (NORCO/VICODIN) 5-325 MG tablet Take 1 tablet by mouth every 4 (four) hours as needed for moderate pain. 60 tablet 0  . hydrocortisone (CORTEF) 10 MG tablet Take 25 mg by mouth as directed.      Marland Kitchen levothyroxine (SYNTHROID, LEVOTHROID) 112 MCG tablet     . metoprolol tartrate (LOPRESSOR) 25 MG tablet Take 25 mg by mouth once.    . Multiple Vitamin (MULTI-VITAMINS) TABS Take by mouth.    . ondansetron (ZOFRAN) 4 MG tablet Take 1 tablet (4 mg total) by mouth every 8 (eight) hours as needed for nausea or vomiting. 30 tablet 2  . pantoprazole (PROTONIX) 40 MG tablet Take 40 mg by mouth 2 (two) times daily.     . prochlorperazine (COMPAZINE) 10 MG tablet Take 1 tablet (10 mg total) by mouth every 6 (six) hours as needed for nausea or vomiting. 30 tablet 2  . vemurafenib (ZELBORAF) 240 MG tablet Take 4 tablets (960 mg total) by mouth every 12 (twelve) hours. Take with water. 240 tablet 2  . venlafaxine XR (EFFEXOR-XR) 150 MG 24 hr capsule 150 mg every morning.     . predniSONE (STERAPRED UNI-PAK 21 TAB) 10 MG (21) TBPK tablet Take 1 tablet (10 mg total) by mouth daily. Take 60 mg on Day 1, 50 mg on Day 2, 40 mg on Day 3, 30 mg on Day 4, 20 mg on Day 5, 10 mg on Day 6, then stop. 21 tablet 0   No current facility-administered medications for this visit.     OBJECTIVE: There were no vitals filed for this visit.   There is no height or weight on file to calculate BMI.    ECOG FS:0 - Asymptomatic  General: Well-developed, well-nourished, no acute distress. Eyes: Pink conjunctiva, anicteric sclera. HEENT: Normocephalic, moist mucous membranes, clear oropharnyx. Lungs: Clear to auscultation bilaterally. Heart: Regular rate and rhythm. No rubs, murmurs, or gallops. Abdomen: Soft, nontender, nondistended. No organomegaly noted, normoactive bowel sounds. Musculoskeletal: No edema, cyanosis, or clubbing. Neuro: Alert, answering all questions appropriately. Cranial nerves grossly intact. Skin: Rash noted on face, ears, and torso consistent with Zelboraf Psych: Normal affect.   LAB RESULTS:  Lab Results  Component Value Date   NA 136 12/09/2015   K 3.5 12/09/2015   CL 101 12/09/2015   CO2  25 12/09/2015   GLUCOSE 85 12/09/2015   BUN 20 12/09/2015   CREATININE 1.38 (H) 12/09/2015   CALCIUM 9.6 12/09/2015   PROT 7.4 12/09/2015   ALBUMIN 4.4 12/09/2015   AST 25 12/09/2015   ALT 24 12/09/2015   ALKPHOS 86 12/09/2015   BILITOT 0.9 12/09/2015   GFRNONAA 42 (L) 12/09/2015   GFRAA 49 (L) 12/09/2015    Lab Results  Component Value Date   WBC 5.7 12/09/2015   NEUTROABS 4.0 12/09/2015   HGB 12.8 12/09/2015   HCT 36.6 12/09/2015   MCV 90.2 12/09/2015   PLT 290 12/09/2015    STUDIES: Nm Pet Image Restage (ps) Whole Body  Result Date: 11/11/2015 CLINICAL DATA:  Subsequent treatment strategy for metastatic melanoma EXAM: NUCLEAR MEDICINE PET WHOLE BODY TECHNIQUE: 12.0 mCi F-18 FDG was injected intravenously. Full-ring PET imaging was performed from the  vertex to the feet after the radiotracer. CT data was obtained and used for attenuation correction and anatomic localization. FASTING BLOOD GLUCOSE:  Value:  79 mg/dl COMPARISON:  PET-CT 06/22/2015 and CT scan 08/19/2015 FINDINGS: Head/Neck: No hypermetabolic lymph nodes in the neck. Chest: No hypermetabolic mediastinal or hilar nodes. No suspicious pulmonary nodules on the CT scan. Abdomen/Pelvis: Hypermetabolic lymphadenopathy is demonstrated in the retroperitoneum. Several small right-sided retroperitoneal lymph nodes are hypermetabolic with SUV max of 4.1. The largest node is just above the iliac crest and just anterior to the psoas muscle on image number 235 and measures 9 mm. SUV max is 3.3. This measures 7 mm on the prior CT scan. Bulky external iliac lymph nodes on the right side are again demonstrated. The largest node measures 33 mm on image number 273 and previously measured 22 mm. SUV max is 21.3 Separate operator lymph node on image number 273 measures 15.5 mm and previously measured 10 mm. SUV max is 8.3. Enlarged hypermetabolic inguinal lymph node on image number 293 measures 24.5 mm and SUV max is 17.9. This lesion was  present on the prior PET-CT from April 2017 and had SUV max of 8.3. It measured 30 mm. Right-sided lymph node just anterior to the acetabulum on image 285 measures 15 mm and previously measured 10 mm. SUV max is 16.3 No left-sided lymphadenopathy or hypermetabolism in the pelvis or inguinal area. Skeleton: No focal hypermetabolic activity to suggest skeletal metastasis. Evidence of a prior left femoral intramedullary rod. Extremities: No hypermetabolic activity to suggest metastasis. IMPRESSION: 1. Enlarging hypermetabolic right-sided retroperitoneal, common iliac, external iliac and right inguinal adenopathy consistent with progressive metastatic melanoma. 2. No findings for metastatic disease involving the chest or solid abdominal organs. 3. No findings for metastatic disease involving the subcutaneous tissues for bony structures. Electronically Signed   By: Marijo Sanes M.D.   On: 11/11/2015 10:49    ONCOLOGIC HISTORY: Patient initially diagnosed with metastatic melanoma to right thigh with unknown primary in 2009. She received 4 weeks of high-dose interferon therapy in February 2009 and proceed with low-dose interferon therapy in March 2009. Patient remained in remission until August 2012 when she was noted to have recurrent melanoma and a pelvic lymph node. October 2012 she had 2 cycles of Ipilumimab which was discontinued secondary to poor tolerance. In April 2017 PET scan showed recurrent disease in the right inguinal region which was confirmed by biopsy.  ASSESSMENT: Recurrent metastatic melanoma, BRAF+, of right lower extremity.  PLAN:    1. Recurrent metastatic melanoma, BRAF+, of right lower extremity:  PET scan results reviewed independently and reported as above with progression of disease. Because of the side effects listed in history of present illness, patient was given a prescription for a Medrol Dosepak and will hold Zelboraf at least one week or until her symptoms resolve. At which time,  she can restart at dose reduced of 731m every 12 hours.  Previously, patient had poor tolerance to ipilumimab, but in retrospect patient feels that her side effects at that time may have been secondary to new psych medicines. Can retry combination immunotherapy in the future if necessary. Return to clinic in 4 weeks for repeat EKG, laboratory work and further evaluation. Will reimage with PET scan in approximately 3 months. 2. Pain: Continue Vicodin today to use as needed. 3. Nausea: Continue Compazine as needed. 4. Rash: Secondary to Zelboraf. Medrol Dosepak and dose reduce as above. 5. Diarrhea: Continue Imodium OTC as needed 6. Shortness of breath:  If patient's symptoms did not resolve with dose reduction, will consider a chest x-ray in the near future.  Approximately 30 minutes was spent in discussion of which greater than 50% was consultation.  Patient expressed understanding and was in agreement with this plan. She also understands that She can call clinic at any time with any questions, concerns, or complaints.   Lloyd Huger, MD   12/09/2015 12:41 PM

## 2015-12-09 ENCOUNTER — Ambulatory Visit
Admission: RE | Admit: 2015-12-09 | Discharge: 2015-12-09 | Disposition: A | Discharge status: Home / Self Care | Payer: Medicare Other | Source: Ambulatory Visit | Attending: Oncology | Admitting: Oncology

## 2015-12-09 ENCOUNTER — Inpatient Hospital Stay (HOSPITAL_BASED_OUTPATIENT_CLINIC_OR_DEPARTMENT_OTHER): Payer: Medicare Other | Admitting: Oncology

## 2015-12-09 ENCOUNTER — Inpatient Hospital Stay: Payer: Medicare Other | Attending: Oncology

## 2015-12-09 DIAGNOSIS — C792 Secondary malignant neoplasm of skin: Secondary | ICD-10-CM | POA: Diagnosis not present

## 2015-12-09 DIAGNOSIS — C4371 Malignant melanoma of right lower limb, including hip: Secondary | ICD-10-CM | POA: Insufficient documentation

## 2015-12-09 DIAGNOSIS — Z9221 Personal history of antineoplastic chemotherapy: Secondary | ICD-10-CM

## 2015-12-09 DIAGNOSIS — Z79899 Other long term (current) drug therapy: Secondary | ICD-10-CM | POA: Diagnosis not present

## 2015-12-09 DIAGNOSIS — R102 Pelvic and perineal pain: Secondary | ICD-10-CM

## 2015-12-09 DIAGNOSIS — H9209 Otalgia, unspecified ear: Secondary | ICD-10-CM | POA: Insufficient documentation

## 2015-12-09 DIAGNOSIS — I509 Heart failure, unspecified: Secondary | ICD-10-CM | POA: Diagnosis not present

## 2015-12-09 DIAGNOSIS — Z801 Family history of malignant neoplasm of trachea, bronchus and lung: Secondary | ICD-10-CM

## 2015-12-09 DIAGNOSIS — K219 Gastro-esophageal reflux disease without esophagitis: Secondary | ICD-10-CM

## 2015-12-09 DIAGNOSIS — R110 Nausea: Secondary | ICD-10-CM | POA: Diagnosis not present

## 2015-12-09 DIAGNOSIS — R197 Diarrhea, unspecified: Secondary | ICD-10-CM | POA: Insufficient documentation

## 2015-12-09 DIAGNOSIS — R21 Rash and other nonspecific skin eruption: Secondary | ICD-10-CM | POA: Insufficient documentation

## 2015-12-09 DIAGNOSIS — G4730 Sleep apnea, unspecified: Secondary | ICD-10-CM | POA: Insufficient documentation

## 2015-12-09 DIAGNOSIS — F17210 Nicotine dependence, cigarettes, uncomplicated: Secondary | ICD-10-CM

## 2015-12-09 DIAGNOSIS — R0602 Shortness of breath: Secondary | ICD-10-CM

## 2015-12-09 DIAGNOSIS — K921 Melena: Secondary | ICD-10-CM

## 2015-12-09 LAB — CBC WITH DIFFERENTIAL/PLATELET
BASOS ABS: 0.1 10*3/uL (ref 0–0.1)
BASOS PCT: 1 %
Eosinophils Absolute: 0.4 10*3/uL (ref 0–0.7)
Eosinophils Relative: 7 %
HEMATOCRIT: 36.6 % (ref 35.0–47.0)
HEMOGLOBIN: 12.8 g/dL (ref 12.0–16.0)
Lymphocytes Relative: 14 %
Lymphs Abs: 0.8 10*3/uL — ABNORMAL LOW (ref 1.0–3.6)
MCH: 31.4 pg (ref 26.0–34.0)
MCHC: 34.9 g/dL (ref 32.0–36.0)
MCV: 90.2 fL (ref 80.0–100.0)
MONO ABS: 0.5 10*3/uL (ref 0.2–0.9)
Monocytes Relative: 8 %
NEUTROS ABS: 4 10*3/uL (ref 1.4–6.5)
NEUTROS PCT: 70 %
Platelets: 290 10*3/uL (ref 150–440)
RBC: 4.06 MIL/uL (ref 3.80–5.20)
RDW: 13.7 % (ref 11.5–14.5)
WBC: 5.7 10*3/uL (ref 3.6–11.0)

## 2015-12-09 LAB — COMPREHENSIVE METABOLIC PANEL
ALK PHOS: 86 U/L (ref 38–126)
ALT: 24 U/L (ref 14–54)
ANION GAP: 10 (ref 5–15)
AST: 25 U/L (ref 15–41)
Albumin: 4.4 g/dL (ref 3.5–5.0)
BUN: 20 mg/dL (ref 6–20)
CALCIUM: 9.6 mg/dL (ref 8.9–10.3)
CO2: 25 mmol/L (ref 22–32)
CREATININE: 1.38 mg/dL — AB (ref 0.44–1.00)
Chloride: 101 mmol/L (ref 101–111)
GFR, EST AFRICAN AMERICAN: 49 mL/min — AB (ref >60)
GFR, EST NON AFRICAN AMERICAN: 42 mL/min — AB (ref >60)
Glucose, Bld: 85 mg/dL (ref 65–99)
Potassium: 3.5 mmol/L (ref 3.5–5.1)
SODIUM: 136 mmol/L (ref 135–145)
TOTAL PROTEIN: 7.4 g/dL (ref 6.5–8.1)
Total Bilirubin: 0.9 mg/dL (ref 0.3–1.2)

## 2015-12-09 LAB — MAGNESIUM: MAGNESIUM: 2.3 mg/dL (ref 1.7–2.4)

## 2015-12-09 LAB — PHOSPHORUS: PHOSPHORUS: 3.3 mg/dL (ref 2.5–4.6)

## 2015-12-09 MED ORDER — PREDNISONE 10 MG (21) PO TBPK: 10.0000 mg | ORAL_TABLET | Freq: Every day | ORAL | 0 refills | Status: DC

## 2015-12-09 NOTE — Progress Notes (Signed)
Patient seen in clinic today for oral chemotherapy follow up appointment. Patient initiated Zelboraf on 11/23/15, upon returning to clinic reports the following symptoms: rash to nose, scalp, breast, tenderness to scalp and ears, nipple discoloration, bright red blood in stool for first week of treatment, shortness of breath, cough, intermittent diarrhea, frequency with urination and insomnia. Patient reports that she has remained compliant with oral chemo and has not missed any days of treatment. Patient to hold treatment until symptoms improve and restart Zelboraf at reduced dose of 720 mg. Prednisone tapered dose prescription to be sent to patients pharmacy for rash.

## 2015-12-09 NOTE — Progress Notes (Signed)
Patient reports symptoms of: rash to nose, scalp, breast, tenderness to scalp and ears, nipple discoloration, bright red blood in stool for only the first week on oral chemo, shortness of breath, cough, intermittent diarrhea, frequency with urination, insomnia. Reports that has remained complaint with taking oral chemo and has not missed any days.

## 2015-12-10 ENCOUNTER — Telehealth: Payer: Self-pay | Admitting: *Deleted

## 2015-12-10 NOTE — Telephone Encounter (Signed)
States has developed blister on arm since yesterday. Per Dr. Grayland Ormond, pt needs to continue medrol dosepak and needs to continue holding zelboraf at this time. Also, pt questions if needs to stop taking hydrocortisone while taking steroid taper. Per MD, pt may take both together. Pt verbalized understanding.

## 2015-12-18 DIAGNOSIS — E230 Hypopituitarism: Secondary | ICD-10-CM | POA: Diagnosis not present

## 2015-12-18 DIAGNOSIS — E2749 Other adrenocortical insufficiency: Secondary | ICD-10-CM | POA: Diagnosis not present

## 2015-12-22 ENCOUNTER — Other Ambulatory Visit: Payer: Self-pay | Admitting: *Deleted

## 2015-12-25 DIAGNOSIS — I340 Nonrheumatic mitral (valve) insufficiency: Secondary | ICD-10-CM | POA: Diagnosis not present

## 2015-12-25 DIAGNOSIS — E230 Hypopituitarism: Secondary | ICD-10-CM | POA: Diagnosis not present

## 2015-12-25 DIAGNOSIS — F17200 Nicotine dependence, unspecified, uncomplicated: Secondary | ICD-10-CM | POA: Diagnosis not present

## 2015-12-25 DIAGNOSIS — R002 Palpitations: Secondary | ICD-10-CM | POA: Diagnosis not present

## 2015-12-25 DIAGNOSIS — C439 Malignant melanoma of skin, unspecified: Secondary | ICD-10-CM | POA: Diagnosis not present

## 2015-12-25 DIAGNOSIS — E2749 Other adrenocortical insufficiency: Secondary | ICD-10-CM | POA: Diagnosis not present

## 2015-12-25 DIAGNOSIS — C779 Secondary and unspecified malignant neoplasm of lymph node, unspecified: Secondary | ICD-10-CM | POA: Diagnosis not present

## 2015-12-25 DIAGNOSIS — I10 Essential (primary) hypertension: Secondary | ICD-10-CM | POA: Diagnosis not present

## 2015-12-25 DIAGNOSIS — E038 Other specified hypothyroidism: Secondary | ICD-10-CM | POA: Diagnosis not present

## 2015-12-25 DIAGNOSIS — I5022 Chronic systolic (congestive) heart failure: Secondary | ICD-10-CM | POA: Diagnosis not present

## 2015-12-28 ENCOUNTER — Other Ambulatory Visit: Payer: Self-pay | Admitting: *Deleted

## 2015-12-28 ENCOUNTER — Telehealth: Payer: Self-pay | Admitting: *Deleted

## 2015-12-28 DIAGNOSIS — C4371 Malignant melanoma of right lower limb, including hip: Secondary | ICD-10-CM

## 2015-12-28 DIAGNOSIS — C439 Malignant melanoma of skin, unspecified: Secondary | ICD-10-CM

## 2015-12-28 DIAGNOSIS — C799 Secondary malignant neoplasm of unspecified site: Secondary | ICD-10-CM

## 2015-12-28 MED ORDER — VEMURAFENIB 240 MG PO TABS: 720.0000 mg | ORAL_TABLET | Freq: Two times a day (BID) | ORAL | 2 refills | Status: DC

## 2015-12-28 NOTE — Telephone Encounter (Signed)
Refill for zelboraf escribed to [facility].

## 2015-12-30 ENCOUNTER — Telehealth: Payer: Self-pay | Admitting: *Deleted

## 2015-12-30 NOTE — Telephone Encounter (Signed)
Stop zelboraf.    What is her current dose of zelboraf?  720mg  daily or 720mg  twice daily?

## 2015-12-30 NOTE — Telephone Encounter (Signed)
Pt called to notify Dr. Grayland Ormond that has developed rash to face, scalp, ears, and vaginal area after restart of zelboraf. Pt currently is taking 3 tablets zelboraf. Please advise.

## 2015-12-30 NOTE — Telephone Encounter (Signed)
Hold Zelboraf until rash resolves then restart at 480 mg twice daily. If rash recurs at this dose, medication will need to be discontinued permanently.

## 2015-12-30 NOTE — Telephone Encounter (Signed)
720mg  twice daily

## 2015-12-30 NOTE — Telephone Encounter (Signed)
Pt notified and pt verbalized understanding to hold zelboraf until rash resolves then to start back on 480mg  twice daily. Pt stated will call our office back when restarts zelboraf.

## 2015-12-31 ENCOUNTER — Telehealth: Payer: Self-pay | Admitting: *Deleted

## 2015-12-31 NOTE — Telephone Encounter (Signed)
Patient requests to discontinue zelboraf entirely and start on different treatment. Please advise.

## 2015-12-31 NOTE — Telephone Encounter (Signed)
Per Dr. Grayland Ormond, may try immunotherapy with yervoy. Left message with patient regarding treatment options and that can discuss further at her next appt on 11/13. Informed pt that EKG scheduled for that morning will be cancelled as well. Instructed pt to remain off zelboraf and to callback if has further questions.

## 2016-01-10 NOTE — Progress Notes (Signed)
[place]  Telephone:([phone] [phone]  ID: Patricia Li OB: 1960/05/07  MR#: [id number]  XBW#:[id number]  Patient Care Team: Donnie Mesa, MD as PCP - General (Family Medicine) Robert Bellow, MD as Consulting Physician (General Surgery) Forest Gleason, MD (Oncology)  CHIEF COMPLAINT: Recurrent metastatic melanoma, BRAF+, of right lower extremity.  INTERVAL HISTORY: Patient returns to clinic today for further evaluation and treatment planning. Despite multiple dose reductions of Zelboraf she continued to develop rash and ultimately discontinue treatment. She currently feels well and is back to her baseline. Although she admits she continues to have occasional red blood per rectum. She has no neurological complaints. She denies any recent fevers or illnesses. She has a good appetite and denies weight loss. She denies any chest pain or shortness of breath. She has no nausea, vomiting, or constipation. She has no urinary complaints. Patient otherwise feels well and offers no further specific complaints.   REVIEW OF SYSTEMS:   Review of Systems  Constitutional: Negative.  Negative for fever, malaise/fatigue and weight loss.  HENT: Negative.  Negative for ear pain.   Eyes: Negative.   Respiratory: Negative for cough and shortness of breath.   Cardiovascular: Negative.  Negative for chest pain.  Gastrointestinal: Negative for abdominal pain, blood in stool and diarrhea.  Genitourinary: Negative.   Musculoskeletal:       Right inguinal pain.  Skin: Negative for itching and rash.  Neurological: Negative.  Negative for sensory change and weakness.  Endo/Heme/Allergies: Does not bruise/bleed easily.  Psychiatric/Behavioral: The patient has insomnia. The patient is not nervous/anxious.     As per HPI. Otherwise, a complete review of systems is negative.  PAST MEDICAL HISTORY: Past Medical History:  Diagnosis Date  . CHF (congestive heart  failure) ([place]) 2016   Dr. Nehemiah Massed  . GERD (gastroesophageal reflux disease) 2012  . Lumbar herniated disc 2010  . Malignant melanoma of skin of lower limb, including hip ([place]) 2008, 2017   right thigh  . Personal history of malignant melanoma of skin 2012  . Tick bite of head 07/20/2015    PAST SURGICAL HISTORY: Past Surgical History:  Procedure Laterality Date  . BACK SURGERY  2010  . BREAST CYST EXCISION  1985  . COLONOSCOPY  2013   [facility]  . FRACTURE SURGERY     rod removed from left femur 07/21/2014  . INGUINAL LYMPH NODE BIOPSY Right 07/17/2015   Procedure: INGUINAL LYMPH NODE BIOPSY/EXCISION;  Surgeon: Robert Bellow, MD;  Location: [facility] ORS;  Service: General;  Laterality: Right;  . MELANOMA EXCISION  2008   right thigh  . SKIN LESION EXCISION Right 12-19-10   posterior    FAMILY HISTORY: Family History  Problem Relation Age of Onset  . COPD Father   . Cancer Sister     Colon, sister       ADVANCED DIRECTIVES:    HEALTH MAINTENANCE: Social History  Substance Use Topics  . Smoking status: Current Every Day Smoker    Packs/day: 0.50    Years: 20.00    Types: Cigarettes  . Smokeless tobacco: Never Used  . Alcohol use 0.0 oz/week     Comment: ocassionally     Colonoscopy:  PAP:  Bone density:  Lipid panel:  Allergies  Allergen Reactions  . Clindamycin Shortness Of Breath  . Aripiprazole Other (See Comments)    Facial and head ticks  . Valproic Acid     Other reaction(s): Other (See Comments) Facial twitching  Current Outpatient Prescriptions  Medication Sig Dispense Refill  . calcium carbonate (OS-CAL) 600 MG TABS tablet Take 600 mg by mouth 2 (two) times daily with a meal.    . clonazePAM (KLONOPIN) 1 MG tablet Take 1 mg by mouth at bedtime.    . furosemide (LASIX) 20 MG tablet Take 20 mg by mouth every other day.     Marland Kitchen HYDROcodone-acetaminophen (NORCO/VICODIN) 5-325 MG tablet Take 1 tablet by mouth every 4 (four) hours as needed for  moderate pain. 60 tablet 0  . hydrocortisone (CORTEF) 10 MG tablet Take 25 mg by mouth as directed.     Marland Kitchen levothyroxine (SYNTHROID, LEVOTHROID) 112 MCG tablet     . metoprolol tartrate (LOPRESSOR) 25 MG tablet Take 25 mg by mouth once.    . Multiple Vitamin (MULTI-VITAMINS) TABS Take by mouth.    . ondansetron (ZOFRAN) 4 MG tablet Take 1 tablet (4 mg total) by mouth every 8 (eight) hours as needed for nausea or vomiting. 30 tablet 2  . pantoprazole (PROTONIX) 40 MG tablet Take 40 mg by mouth 2 (two) times daily.     . prochlorperazine (COMPAZINE) 10 MG tablet Take 1 tablet (10 mg total) by mouth every 6 (six) hours as needed for nausea or vomiting. 30 tablet 2  . venlafaxine XR (EFFEXOR-XR) 150 MG 24 hr capsule 150 mg every morning.      No current facility-administered medications for this visit.     OBJECTIVE: Vitals:   01/11/16 1047  BP: 134/82  Pulse: 71  Resp: 18  Temp: 98.3 F (36.8 C)     Body mass index is 25.81 kg/m.    ECOG FS:0 - Asymptomatic  General: Well-developed, well-nourished, no acute distress. Eyes: Pink conjunctiva, anicteric sclera. HEENT: Normocephalic, moist mucous membranes, clear oropharnyx. Lungs: Clear to auscultation bilaterally. Heart: Regular rate and rhythm. No rubs, murmurs, or gallops. Abdomen: Soft, nontender, nondistended. No organomegaly noted, normoactive bowel sounds. Musculoskeletal: No edema, cyanosis, or clubbing. Neuro: Alert, answering all questions appropriately. Cranial nerves grossly intact. Skin: Rash noted on face, ears, and torso consistent with Zelboraf Psych: Normal affect.   LAB RESULTS:  Lab Results  Component Value Date   NA 134 (L) 01/11/2016   K 4.2 01/11/2016   CL 103 01/11/2016   CO2 24 01/11/2016   GLUCOSE 105 (H) 01/11/2016   BUN 22 (H) 01/11/2016   CREATININE 1.34 (H) 01/11/2016   CALCIUM 9.4 01/11/2016   PROT 7.2 01/11/2016   ALBUMIN 4.3 01/11/2016   AST 27 01/11/2016   ALT 21 01/11/2016   ALKPHOS 61  01/11/2016   BILITOT 0.8 01/11/2016   GFRNONAA 44 (L) 01/11/2016   GFRAA 51 (L) 01/11/2016    Lab Results  Component Value Date   WBC 6.8 01/11/2016   NEUTROABS 5.7 01/11/2016   HGB 13.5 01/11/2016   HCT 38.8 01/11/2016   MCV 90.4 01/11/2016   PLT 268 01/11/2016    STUDIES: No results found.  ONCOLOGIC HISTORY: Patient initially diagnosed with metastatic melanoma to right thigh with unknown primary in 2009. She received 4 weeks of high-dose interferon therapy in February 2009 and proceed with low-dose interferon therapy in March 2009. Patient remained in remission until August 2012 when she was noted to have recurrent melanoma and a pelvic lymph node. October 2012 she had 2 cycles of Ipilumimab which was discontinued secondary to poor tolerance. In April 2017 PET scan showed recurrent disease in the right inguinal region which was confirmed by biopsy. Zeboraf discontinued in November  2017 secondary to intolerable rash. Patient initiated combination immunotherapy with nivolumab and ipilumimab on January 14, 2016.  Discontinued secondary to intolerable rash.  ASSESSMENT: Recurrent metastatic melanoma, BRAF+, of right lower extremity.  PLAN:    1. Recurrent metastatic melanoma, BRAF+, of right lower extremity:  Despite multiple dose reductions of Zelboraf, patient continued to have recurrent, intolerable rash and treatment was discontinued altogether. After lengthy discussion with the patient to attempt another oral medication or oral combination such as dabrafenib/trametinib or cobimetinib, she has elected to pursue combination immunotherapy with nivolumab and ipilumimab. She will receive 1 mg/kg nivolumab + 3 mg/kg ipilumimab every 3 weeks for 4 doses at which pointwill consider maintenance treatment with 240 mg flat dose nivolumab every 2 weeks until disease progression or unacceptable toxicity. Will get restaging PET scan prior to initiating second line treatment. Of note, previously  patient had poor tolerance to ipilumimab, but in retrospect patient feels that her side effects at that time may have been secondary to new psych medicines. Return to clinic on Thursday, January 14, 2016 for consideration of cycle 1 of 4. 2. Pain: Continue Vicodin today to use as needed. 3. Nausea: Continue Compazine as needed. 4. Rash: Secondary to Zelboraf. Resolved.. 5. Diarrhea: Continue Imodium OTC as needed  Approximately 30 minutes was spent in discussion of which greater than 50% was consultation.  Patient expressed understanding and was in agreement with this plan. She also understands that She can call clinic at any time with any questions, concerns, or complaints.   Lloyd Huger, MD   01/11/2016 12:47 PM

## 2016-01-11 ENCOUNTER — Inpatient Hospital Stay (HOSPITAL_BASED_OUTPATIENT_CLINIC_OR_DEPARTMENT_OTHER): Payer: Medicare Other | Admitting: Oncology

## 2016-01-11 ENCOUNTER — Inpatient Hospital Stay: Payer: Medicare Other | Attending: Oncology

## 2016-01-11 ENCOUNTER — Ambulatory Visit: Payer: Medicare Other | Attending: Family Medicine

## 2016-01-11 VITALS — BP 134/82 | HR 71 | Temp 98.3°F | Resp 18 | Wt 164.8 lb

## 2016-01-11 DIAGNOSIS — R21 Rash and other nonspecific skin eruption: Secondary | ICD-10-CM

## 2016-01-11 DIAGNOSIS — R197 Diarrhea, unspecified: Secondary | ICD-10-CM | POA: Diagnosis not present

## 2016-01-11 DIAGNOSIS — Z85828 Personal history of other malignant neoplasm of skin: Secondary | ICD-10-CM | POA: Diagnosis not present

## 2016-01-11 DIAGNOSIS — F17210 Nicotine dependence, cigarettes, uncomplicated: Secondary | ICD-10-CM | POA: Diagnosis not present

## 2016-01-11 DIAGNOSIS — I509 Heart failure, unspecified: Secondary | ICD-10-CM | POA: Insufficient documentation

## 2016-01-11 DIAGNOSIS — Z79899 Other long term (current) drug therapy: Secondary | ICD-10-CM | POA: Diagnosis not present

## 2016-01-11 DIAGNOSIS — C799 Secondary malignant neoplasm of unspecified site: Secondary | ICD-10-CM | POA: Diagnosis not present

## 2016-01-11 DIAGNOSIS — Z800 Family history of malignant neoplasm of digestive organs: Secondary | ICD-10-CM | POA: Diagnosis not present

## 2016-01-11 DIAGNOSIS — I10 Essential (primary) hypertension: Secondary | ICD-10-CM

## 2016-01-11 DIAGNOSIS — Z5112 Encounter for antineoplastic immunotherapy: Secondary | ICD-10-CM | POA: Diagnosis not present

## 2016-01-11 DIAGNOSIS — K219 Gastro-esophageal reflux disease without esophagitis: Secondary | ICD-10-CM | POA: Insufficient documentation

## 2016-01-11 DIAGNOSIS — C439 Malignant melanoma of skin, unspecified: Secondary | ICD-10-CM

## 2016-01-11 DIAGNOSIS — K625 Hemorrhage of anus and rectum: Secondary | ICD-10-CM

## 2016-01-11 DIAGNOSIS — R110 Nausea: Secondary | ICD-10-CM | POA: Diagnosis not present

## 2016-01-11 DIAGNOSIS — C4371 Malignant melanoma of right lower limb, including hip: Secondary | ICD-10-CM | POA: Diagnosis not present

## 2016-01-11 LAB — CBC WITH DIFFERENTIAL/PLATELET
Basophils Absolute: 0.1 10*3/uL (ref 0–0.1)
Basophils Relative: 1 %
EOS ABS: 0.2 10*3/uL (ref 0–0.7)
EOS PCT: 3 %
HCT: 38.8 % (ref 35.0–47.0)
Hemoglobin: 13.5 g/dL (ref 12.0–16.0)
LYMPHS ABS: 0.6 10*3/uL — AB (ref 1.0–3.6)
Lymphocytes Relative: 9 %
MCH: 31.4 pg (ref 26.0–34.0)
MCHC: 34.7 g/dL (ref 32.0–36.0)
MCV: 90.4 fL (ref 80.0–100.0)
MONO ABS: 0.2 10*3/uL (ref 0.2–0.9)
MONOS PCT: 4 %
Neutro Abs: 5.7 10*3/uL (ref 1.4–6.5)
Neutrophils Relative %: 83 %
PLATELETS: 268 10*3/uL (ref 150–440)
RBC: 4.3 MIL/uL (ref 3.80–5.20)
RDW: 14.1 % (ref 11.5–14.5)
WBC: 6.8 10*3/uL (ref 3.6–11.0)

## 2016-01-11 LAB — COMPREHENSIVE METABOLIC PANEL
ALT: 21 U/L (ref 14–54)
ANION GAP: 7 (ref 5–15)
AST: 27 U/L (ref 15–41)
Albumin: 4.3 g/dL (ref 3.5–5.0)
Alkaline Phosphatase: 61 U/L (ref 38–126)
BUN: 22 mg/dL — ABNORMAL HIGH (ref 6–20)
CHLORIDE: 103 mmol/L (ref 101–111)
CO2: 24 mmol/L (ref 22–32)
Calcium: 9.4 mg/dL (ref 8.9–10.3)
Creatinine, Ser: 1.34 mg/dL — ABNORMAL HIGH (ref 0.44–1.00)
GFR, EST AFRICAN AMERICAN: 51 mL/min — AB (ref >60)
GFR, EST NON AFRICAN AMERICAN: 44 mL/min — AB (ref >60)
Glucose, Bld: 105 mg/dL — ABNORMAL HIGH (ref 65–99)
POTASSIUM: 4.2 mmol/L (ref 3.5–5.1)
SODIUM: 134 mmol/L — AB (ref 135–145)
Total Bilirubin: 0.8 mg/dL (ref 0.3–1.2)
Total Protein: 7.2 g/dL (ref 6.5–8.1)

## 2016-01-11 LAB — MAGNESIUM: Magnesium: 2.4 mg/dL (ref 1.7–2.4)

## 2016-01-11 LAB — PHOSPHORUS: PHOSPHORUS: 3.2 mg/dL (ref 2.5–4.6)

## 2016-01-11 NOTE — Progress Notes (Signed)
START OFF PATHWAY REGIMEN - Melanoma  Off Pathway: Nivolumab 1 mg/kg + Ipilimumab 3 mg/kg q21 Days x 4 Doses  [id number]:Nivolumab 1 mg/kg + Ipilimumab 3 mg/kg q21 Days x 4 Doses:   A cycle is every 21 days:     Nivolumab (Opdivo(R)) 1 mg/kg in NS IV over 60 minutes every 21 days for a total of four doses.  Inline filter required (low protein binding). Final concentration should be between 1 and 10 mg/mL. Dose Mod: None     Ipilimumab [facility])) 3 mg/kg in NS IV over 90 minutes every 21 days for a total of four doses.  Inline filter required (low protein binding). Final concentration should be between 1 and 2 mg/mL. Dose Mod: None Additional Orders: Severe immune-mediated reactions can occur (e.g. pneumonitis, colitis, and hepatitis).  See prescribing information for more details including monitoring and required immediate management with steroids.  Monitor thyroid, renal, liver  function tests, glucose, and sodium at baseline and prior to each dose of therapy. *NOTE: After four cycles of combination therapy, referenced studies discontinued ipilimumab and proceeded three weeks later with single-agent nivolumab maintenance until  disease progression/intolerability (recommended subsequent single-agent dose is nivolumab 240 mg flat dose q14 days, see separate single-agent regimen template). Natividad Brood, et al. Alison Stalling J Med. 2015;373(1):23-34. Postow MA, et al. Alison Stalling J Med. 2015;372(21):2006-17.  Opdivo (nivolumab).  [place] FDA-approved manufacturer's package insert.  Yervoy (ipilimumab).  [place] FDA-approved manufacturer's package insert.  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 I would be surprised if this patient died in the next year Line of Therapy: Second Line Mutation Status: BRAF V600 Activating Mutation Positive Prior Immunotherapy Status: No Prior PD-1 Inhibitor  Intent of Therapy: Non-Curative / Palliative Intent, Discussed with Patient

## 2016-01-11 NOTE — Progress Notes (Signed)
States tried 2 tablets of zelboraf BID for about 6 days then discontinued due to rash. Pt currently not taking zelboraf and is here to discuss changing treatments for melanoma. Pt still has mild rash.

## 2016-01-12 NOTE — Progress Notes (Signed)
[place]  Telephone:(336) [phone] [phone]  ID: Patricia Li OB: 03-19-1960  MR#: [id number]  JTT#:[id number]  Patient Care Team: Donnie Mesa, MD as PCP - General (Family Medicine) Robert Bellow, MD as Consulting Physician (General Surgery) Forest Gleason, MD (Oncology)  CHIEF COMPLAINT: Recurrent metastatic melanoma, BRAF+, of right lower extremity.  INTERVAL HISTORY: Patient returns to clinic today for further evaluation and discussion of her imaging results, and initiation of combination immunotherapy. Her rash has nearly resolved. She has no neurological complaints. She denies any recent fevers or illnesses. She has a good appetite and denies weight loss. She denies any chest pain or shortness of breath. She has no nausea, vomiting, or constipation. She has no urinary complaints. Patient otherwise feels well and offers no further specific complaints.   REVIEW OF SYSTEMS:   Review of Systems  Constitutional: Negative.  Negative for fever, malaise/fatigue and weight loss.  HENT: Negative.  Negative for ear pain.   Eyes: Negative.   Respiratory: Negative for cough and shortness of breath.   Cardiovascular: Negative.  Negative for chest pain.  Gastrointestinal: Negative for abdominal pain, blood in stool and diarrhea.  Genitourinary: Negative.   Musculoskeletal:       Right inguinal pain.  Skin: Negative for itching and rash.  Neurological: Negative.  Negative for sensory change and weakness.  Endo/Heme/Allergies: Does not bruise/bleed easily.  Psychiatric/Behavioral: The patient has insomnia. The patient is not nervous/anxious.     As per HPI. Otherwise, a complete review of systems is negative.  PAST MEDICAL HISTORY: Past Medical History:  Diagnosis Date  . CHF (congestive heart failure) ([place]) 2016   Dr. Nehemiah Massed  . GERD (gastroesophageal reflux disease) 2012  . Lumbar herniated disc 2010  . Malignant melanoma of skin of lower  limb, including hip ([place]) 2008, 2017   right thigh  . Personal history of malignant melanoma of skin 2012  . Tick bite of head 07/20/2015    PAST SURGICAL HISTORY: Past Surgical History:  Procedure Laterality Date  . BACK SURGERY  2010  . BREAST CYST EXCISION  1985  . COLONOSCOPY  2013   [facility]  . FRACTURE SURGERY     rod removed from left femur 07/21/2014  . INGUINAL LYMPH NODE BIOPSY Right 07/17/2015   Procedure: INGUINAL LYMPH NODE BIOPSY/EXCISION;  Surgeon: Robert Bellow, MD;  Location: [facility] ORS;  Service: General;  Laterality: Right;  . MELANOMA EXCISION  2008   right thigh  . SKIN LESION EXCISION Right 12-19-10   posterior    FAMILY HISTORY: Family History  Problem Relation Age of Onset  . COPD Father   . Cancer Sister     Colon, sister       ADVANCED DIRECTIVES:    HEALTH MAINTENANCE: Social History  Substance Use Topics  . Smoking status: Current Every Day Smoker    Packs/day: 0.50    Years: 20.00    Types: Cigarettes  . Smokeless tobacco: Never Used  . Alcohol use 0.0 oz/week     Comment: ocassionally     Colonoscopy:  PAP:  Bone density:  Lipid panel:  Allergies  Allergen Reactions  . Clindamycin Shortness Of Breath  . Aripiprazole Other (See Comments)    Facial and head ticks  . Valproic Acid     Other reaction(s): Other (See Comments) Facial twitching    Current Outpatient Prescriptions  Medication Sig Dispense Refill  . calcium carbonate (OS-CAL) 600 MG TABS tablet Take 600 mg by mouth 2 (  two) times daily with a meal.    . clonazePAM (KLONOPIN) 1 MG tablet Take 1 mg by mouth at bedtime.    . furosemide (LASIX) 20 MG tablet Take 20 mg by mouth every other day.     Marland Kitchen HYDROcodone-acetaminophen (NORCO/VICODIN) 5-325 MG tablet Take 1 tablet by mouth every 4 (four) hours as needed for moderate pain. 60 tablet 0  . hydrocortisone (CORTEF) 10 MG tablet Take 25 mg by mouth as directed.     Marland Kitchen levothyroxine (SYNTHROID, LEVOTHROID) 112 MCG tablet      . metoprolol tartrate (LOPRESSOR) 25 MG tablet Take 25 mg by mouth once.    . Multiple Vitamin (MULTI-VITAMINS) TABS Take by mouth.    . ondansetron (ZOFRAN) 4 MG tablet Take 1 tablet (4 mg total) by mouth every 8 (eight) hours as needed for nausea or vomiting. 30 tablet 2  . pantoprazole (PROTONIX) 40 MG tablet Take 40 mg by mouth 2 (two) times daily.     Marland Kitchen venlafaxine (EFFEXOR) 75 MG tablet Take 75 mg by mouth at bedtime.    Marland Kitchen venlafaxine XR (EFFEXOR-XR) 150 MG 24 hr capsule 150 mg every morning.      No current facility-administered medications for this visit.     OBJECTIVE: Vitals:   01/14/16 0938  BP: (!) 150/91  Pulse: 64  Temp: 98.6 F (37 C)     Body mass index is 25.86 kg/m.    ECOG FS:0 - Asymptomatic  General: Well-developed, well-nourished, no acute distress. Eyes: Pink conjunctiva, anicteric sclera. HEENT: Normocephalic, moist mucous membranes, clear oropharnyx. Lungs: Clear to auscultation bilaterally. Heart: Regular rate and rhythm. No rubs, murmurs, or gallops. Abdomen: Soft, nontender, nondistended. No organomegaly noted, normoactive bowel sounds. Musculoskeletal: No edema, cyanosis, or clubbing. Neuro: Alert, answering all questions appropriately. Cranial nerves grossly intact. Skin: Rash noted on face, ears, and torso consistent with Zelboraf Psych: Normal affect.   LAB RESULTS:  Lab Results  Component Value Date   NA 135 01/14/2016   K 4.2 01/14/2016   CL 101 01/14/2016   CO2 29 01/14/2016   GLUCOSE 93 01/14/2016   BUN 19 01/14/2016   CREATININE 1.19 (H) 01/14/2016   CALCIUM 9.9 01/14/2016   PROT 7.3 01/14/2016   ALBUMIN 4.4 01/14/2016   AST 24 01/14/2016   ALT 20 01/14/2016   ALKPHOS 51 01/14/2016   BILITOT 0.6 01/14/2016   GFRNONAA 51 (L) 01/14/2016   GFRAA 59 (L) 01/14/2016    Lab Results  Component Value Date   WBC 7.4 01/14/2016   NEUTROABS 5.5 01/14/2016   HGB 13.9 01/14/2016   HCT 40.3 01/14/2016   MCV 91.1 01/14/2016   PLT  267 01/14/2016    STUDIES: Nm Pet Image Restage (ps) Whole Body  Result Date: 01/13/2016 CLINICAL DATA:  Subsequent treatment strategy for metastatic melanoma. EXAM: NUCLEAR MEDICINE PET WHOLE BODY TECHNIQUE: 13.1 mCi F-18 FDG was injected intravenously. Full-ring PET imaging was performed from the vertex to the feet after the radiotracer. CT data was obtained and used for attenuation correction and anatomic localization. FASTING BLOOD GLUCOSE:  Value: 80 mg/dl COMPARISON:  Multiple prior PET scans. The most recent is 11/11/2015 FINDINGS: NECK No hypermetabolic lymph nodes in the neck. CHEST No hypermetabolic mediastinal or hilar nodes. No suspicious pulmonary nodules on the CT scan. ABDOMEN/PELVIS No abnormal hypermetabolic activity within the liver, pancreas, adrenal glands, or spleen. No hypermetabolic lymph nodes in the abdomen. Significant interval improvement of the right-sided pelvic lymphadenopathy. The previously enlarged and hypermetabolic right-sided external  iliac lymph nodes have decreased in size. The largest node on image number 261 measures 23.5 x 20.5 mm and previously measured 35 x 30 mm. SUV max is 20.3. It was previously 21.3. Interval decrease in size of the right-sided inguinal lymph nodes. The largest node measures 11 mm on image number 272 and previously measured 15.5 mm. Weak hypermetabolism with SUV max of 3.2. This was previously 16.3. SKELETON No focal hypermetabolic activity to suggest skeletal metastasis. Other: No cutaneous or subcutaneous hypermetabolic nodules are identified. Stable surgical scarring changes involving the right anterior thigh. IMPRESSION: 1. Significant interval improved appearance of the right-sided pelvic and inguinal lymphadenopathy, suggesting a good response to treatment. One persistent enlarged and hypermetabolic external iliac lymph node. 2. No new adenopathy or new metastatic lesions. Electronically Signed   By: Marijo Sanes M.D.   On: 01/13/2016  14:30    ONCOLOGIC HISTORY: Patient initially diagnosed with metastatic melanoma to right thigh with unknown primary in 2009. She received 4 weeks of high-dose interferon therapy in February 2009 and proceed with low-dose interferon therapy in March 2009. Patient remained in remission until August 2012 when she was noted to have recurrent melanoma and a pelvic lymph node. October 2012 she had 2 cycles of Ipilumimab which was discontinued secondary to poor tolerance. In April 2017 PET scan showed recurrent disease in the right inguinal region which was confirmed by biopsy. Zeboraf discontinued in November 2017 secondary to intolerable rash. Patient initiated combination immunotherapy with nivolumab and ipilumimab on January 14, 2016.  Discontinued secondary to intolerable rash.  ASSESSMENT: Recurrent metastatic melanoma, BRAF+, of right lower extremity.  PLAN:    1. Recurrent metastatic melanoma, BRAF+, of right lower extremity:  PET scan results reviewed independently and reported as above with improvement of disease burden. Despite this, Zelboraf has been discontinued after persistent rash despite multiple dose reductions. After lengthy discussion with the patient to attempt another oral medication or oral combination such as dabrafenib/trametinib or cobimetinib, she has elected to pursue combination immunotherapy with nivolumab and ipilumimab. She will receive 1 mg/kg nivolumab + 3 mg/kg ipilumimab every 3 weeks for 4 doses at which point will consider maintenance treatment with 240 mg flat dose nivolumab every 2 weeks until disease progression or unacceptable toxicity. Of note, previously patient had poor tolerance to ipilumimab, but in retrospect patient feels that her side effects at that time may have been secondary to new psych medicines. Proceed with cycle 1 of 4 nivolumab and ipilumimab today. Return to clinic in 3 weeks for consideration of cycle 2. 2. Pain: Continue Vicodin today to use as  needed. 3. Nausea: Continue Compazine as needed. 4. Rash: Secondary to Zelboraf. Resolved. 5. Diarrhea: Continue Imodium OTC as needed  Approximately 30 minutes was spent in discussion of which greater than 50% was consultation.  Patient expressed understanding and was in agreement with this plan. She also understands that She can call clinic at any time with any questions, concerns, or complaints.   Lloyd Huger, MD   01/15/2016 1:28 PM

## 2016-01-13 ENCOUNTER — Ambulatory Visit
Admission: RE | Admit: 2016-01-13 | Discharge: 2016-01-13 | Disposition: A | Discharge status: Home / Self Care | Payer: Medicare Other | Source: Ambulatory Visit | Attending: Oncology | Admitting: Oncology

## 2016-01-13 DIAGNOSIS — R590 Localized enlarged lymph nodes: Secondary | ICD-10-CM | POA: Diagnosis not present

## 2016-01-13 DIAGNOSIS — Z79899 Other long term (current) drug therapy: Secondary | ICD-10-CM | POA: Diagnosis not present

## 2016-01-13 DIAGNOSIS — C4371 Malignant melanoma of right lower limb, including hip: Secondary | ICD-10-CM | POA: Insufficient documentation

## 2016-01-13 DIAGNOSIS — C439 Malignant melanoma of skin, unspecified: Secondary | ICD-10-CM | POA: Diagnosis not present

## 2016-01-13 LAB — GLUCOSE, CAPILLARY: GLUCOSE-CAPILLARY: 80 mg/dL (ref 65–99)

## 2016-01-13 MED ORDER — FLUDEOXYGLUCOSE F - 18 (FDG) INJECTION
13.1000 | Freq: Once | INTRAVENOUS | Status: AC | PRN
  Administered 2016-01-13: 13.1 via INTRAVENOUS

## 2016-01-14 ENCOUNTER — Inpatient Hospital Stay (HOSPITAL_BASED_OUTPATIENT_CLINIC_OR_DEPARTMENT_OTHER): Payer: Medicare Other | Admitting: Oncology

## 2016-01-14 ENCOUNTER — Inpatient Hospital Stay: Payer: Medicare Other

## 2016-01-14 ENCOUNTER — Encounter: Payer: Self-pay | Admitting: Oncology

## 2016-01-14 VITALS — BP 150/91 | HR 64 | Temp 98.6°F | Wt 165.1 lb

## 2016-01-14 DIAGNOSIS — K219 Gastro-esophageal reflux disease without esophagitis: Secondary | ICD-10-CM

## 2016-01-14 DIAGNOSIS — R110 Nausea: Secondary | ICD-10-CM | POA: Diagnosis not present

## 2016-01-14 DIAGNOSIS — C439 Malignant melanoma of skin, unspecified: Secondary | ICD-10-CM

## 2016-01-14 DIAGNOSIS — C799 Secondary malignant neoplasm of unspecified site: Secondary | ICD-10-CM | POA: Diagnosis not present

## 2016-01-14 DIAGNOSIS — Z85828 Personal history of other malignant neoplasm of skin: Secondary | ICD-10-CM

## 2016-01-14 DIAGNOSIS — F17210 Nicotine dependence, cigarettes, uncomplicated: Secondary | ICD-10-CM

## 2016-01-14 DIAGNOSIS — Z79899 Other long term (current) drug therapy: Secondary | ICD-10-CM

## 2016-01-14 DIAGNOSIS — R197 Diarrhea, unspecified: Secondary | ICD-10-CM | POA: Diagnosis not present

## 2016-01-14 DIAGNOSIS — Z5112 Encounter for antineoplastic immunotherapy: Secondary | ICD-10-CM | POA: Diagnosis not present

## 2016-01-14 DIAGNOSIS — Z800 Family history of malignant neoplasm of digestive organs: Secondary | ICD-10-CM

## 2016-01-14 DIAGNOSIS — R21 Rash and other nonspecific skin eruption: Secondary | ICD-10-CM | POA: Diagnosis not present

## 2016-01-14 DIAGNOSIS — K625 Hemorrhage of anus and rectum: Secondary | ICD-10-CM

## 2016-01-14 DIAGNOSIS — C4371 Malignant melanoma of right lower limb, including hip: Secondary | ICD-10-CM

## 2016-01-14 DIAGNOSIS — I10 Essential (primary) hypertension: Secondary | ICD-10-CM

## 2016-01-14 DIAGNOSIS — I509 Heart failure, unspecified: Secondary | ICD-10-CM

## 2016-01-14 LAB — CBC WITH DIFFERENTIAL/PLATELET
BASOS ABS: 0.1 10*3/uL (ref 0–0.1)
BASOS PCT: 1 %
EOS ABS: 0.2 10*3/uL (ref 0–0.7)
Eosinophils Relative: 2 %
HCT: 40.3 % (ref 35.0–47.0)
HEMOGLOBIN: 13.9 g/dL (ref 12.0–16.0)
Lymphocytes Relative: 17 %
Lymphs Abs: 1.2 10*3/uL (ref 1.0–3.6)
MCH: 31.5 pg (ref 26.0–34.0)
MCHC: 34.5 g/dL (ref 32.0–36.0)
MCV: 91.1 fL (ref 80.0–100.0)
Monocytes Absolute: 0.4 10*3/uL (ref 0.2–0.9)
Monocytes Relative: 6 %
NEUTROS PCT: 74 %
Neutro Abs: 5.5 10*3/uL (ref 1.4–6.5)
Platelets: 267 10*3/uL (ref 150–440)
RBC: 4.42 MIL/uL (ref 3.80–5.20)
RDW: 14.1 % (ref 11.5–14.5)
WBC: 7.4 10*3/uL (ref 3.6–11.0)

## 2016-01-14 LAB — COMPREHENSIVE METABOLIC PANEL
ALBUMIN: 4.4 g/dL (ref 3.5–5.0)
ALK PHOS: 51 U/L (ref 38–126)
ALT: 20 U/L (ref 14–54)
AST: 24 U/L (ref 15–41)
Anion gap: 5 (ref 5–15)
BUN: 19 mg/dL (ref 6–20)
CALCIUM: 9.9 mg/dL (ref 8.9–10.3)
CO2: 29 mmol/L (ref 22–32)
CREATININE: 1.19 mg/dL — AB (ref 0.44–1.00)
Chloride: 101 mmol/L (ref 101–111)
GFR calc Af Amer: 59 mL/min — ABNORMAL LOW (ref >60)
GFR calc non Af Amer: 51 mL/min — ABNORMAL LOW (ref >60)
GLUCOSE: 93 mg/dL (ref 65–99)
Potassium: 4.2 mmol/L (ref 3.5–5.1)
SODIUM: 135 mmol/L (ref 135–145)
Total Bilirubin: 0.6 mg/dL (ref 0.3–1.2)
Total Protein: 7.3 g/dL (ref 6.5–8.1)

## 2016-01-14 LAB — TSH: TSH: 1.673 u[IU]/mL (ref 0.350–4.500)

## 2016-01-14 MED ORDER — SODIUM CHLORIDE 0.9 % IV SOLN
Freq: Once | INTRAVENOUS | Status: AC
  Administered 2016-01-14: 10:00:00 via INTRAVENOUS
  Filled 2016-01-14: qty 1000

## 2016-01-14 MED ORDER — SODIUM CHLORIDE 0.9 % IV SOLN
1.0000 mg/kg | Freq: Once | INTRAVENOUS | Status: AC
  Administered 2016-01-14: 70 mg via INTRAVENOUS
  Filled 2016-01-14: qty 7

## 2016-01-14 MED ORDER — SODIUM CHLORIDE 0.9 % IV SOLN
3.0000 mg/kg | Freq: Once | INTRAVENOUS | Status: AC
  Administered 2016-01-14: 225 mg via INTRAVENOUS
  Filled 2016-01-14: qty 40

## 2016-01-15 DIAGNOSIS — L578 Other skin changes due to chronic exposure to nonionizing radiation: Secondary | ICD-10-CM | POA: Diagnosis not present

## 2016-01-15 DIAGNOSIS — C4359 Malignant melanoma of other part of trunk: Secondary | ICD-10-CM | POA: Diagnosis not present

## 2016-01-15 DIAGNOSIS — L570 Actinic keratosis: Secondary | ICD-10-CM | POA: Diagnosis not present

## 2016-01-15 LAB — T4: T4 TOTAL: 7.6 ug/dL (ref 4.5–12.0)

## 2016-01-15 LAB — T3, FREE: T3, Free: 2.8 pg/mL (ref 2.0–4.4)

## 2016-01-28 DIAGNOSIS — I340 Nonrheumatic mitral (valve) insufficiency: Secondary | ICD-10-CM | POA: Diagnosis not present

## 2016-01-28 DIAGNOSIS — I5022 Chronic systolic (congestive) heart failure: Secondary | ICD-10-CM | POA: Diagnosis not present

## 2016-02-03 NOTE — Progress Notes (Signed)
[place]  [phone] [phone]  ID: [name] OB: [birth year]  MR#: [id number]  KYH#:[id number]  Patient Care Team: Donnie Mesa, MD as PCP - General (Family Medicine) Robert Bellow, MD as Consulting Physician (General Surgery) Forest Gleason, MD (Oncology)  CHIEF COMPLAINT: Recurrent metastatic melanoma, BRAF+, of right lower extremity.  INTERVAL HISTORY: Patient returns to clinic today for further evaluation and treatment of cycle 2 of 4 nivolumab and ipilumimab today. She states she has been feeling well with very few side effects. She complains of mild body aches and would like a prescription for meloxicam. She also complains of tinnitus that began prior to treatment that is getting worse. No other neurological complaints. She denies any recent fevers or illnesses. She has a good appetite and denies weight loss. She denies any chest pain or shortness of breath. She has no nausea, vomiting, or constipation. She has no urinary complaints. Patient otherwise feels well and offers no further specific complaints.   REVIEW OF SYSTEMS:   Review of Systems  Constitutional: Negative.  Negative for fever, malaise/fatigue and weight loss.  HENT: Positive for tinnitus (Began prior to immunotherapy). Negative for ear pain.   Eyes: Negative.   Respiratory: Negative for cough and shortness of breath.   Cardiovascular: Negative.  Negative for chest pain.  Gastrointestinal: Negative for abdominal pain, blood in stool and diarrhea.  Genitourinary: Negative.   Musculoskeletal:       Right inguinal pain.  Skin: Negative for itching and rash.  Neurological: Negative.  Negative for sensory change and weakness.  Endo/Heme/Allergies: Does not bruise/bleed easily.  Psychiatric/Behavioral: The patient has insomnia. The patient is not nervous/anxious.     As per HPI. Otherwise, a complete review of systems is negative.  PAST MEDICAL HISTORY: Past  Medical History:  Diagnosis Date  . CHF (congestive heart failure) ([place]) 2016   Dr. Nehemiah Massed  . GERD (gastroesophageal reflux disease) 2012  . Lumbar herniated disc 2010  . Malignant melanoma of skin of lower limb, including hip ([place]) 2008, 2017   right thigh  . Personal history of malignant melanoma of skin 2012  . Tick bite of head 07/20/2015    PAST SURGICAL HISTORY: Past Surgical History:  Procedure Laterality Date  . BACK SURGERY  2010  . BREAST CYST EXCISION  1985  . COLONOSCOPY  07/2012   [facility]  . FRACTURE SURGERY     rod removed from left femur 07/21/2014  . INGUINAL LYMPH NODE BIOPSY Right 07/17/2015   Procedure: INGUINAL LYMPH NODE BIOPSY/EXCISION;  Surgeon: Robert Bellow, MD;  Location: [facility] ORS;  Service: General;  Laterality: Right;  . MELANOMA EXCISION  2008   right thigh  . SKIN LESION EXCISION Right 12-19-10   posterior    FAMILY HISTORY: Family History  Problem Relation Age of Onset  . COPD Father   . Cancer Sister     Colon, sister       ADVANCED DIRECTIVES:    HEALTH MAINTENANCE: Social History  Substance Use Topics  . Smoking status: Current Every Day Smoker    Packs/day: 0.50    Years: 20.00    Types: Cigarettes  . Smokeless tobacco: Never Used  . Alcohol use 0.0 oz/week     Comment: ocassionally     Colonoscopy:  PAP:  Bone density:  Lipid panel:  Allergies  Allergen Reactions  . Clindamycin Shortness Of Breath  . Aripiprazole Other (See Comments)    Facial and head ticks  . Valproic  Acid     Other reaction(s): Other (See Comments) Facial twitching    Current Outpatient Prescriptions  Medication Sig Dispense Refill  . calcium carbonate (OS-CAL) 600 MG TABS tablet Take 600 mg by mouth 2 (two) times daily with a meal.    . clonazePAM (KLONOPIN) 1 MG tablet Take 1 mg by mouth at bedtime.    . furosemide (LASIX) 20 MG tablet Take 20 mg by mouth every other day.     Marland Kitchen HYDROcodone-acetaminophen (NORCO/VICODIN) 5-325 MG tablet  Take 1 tablet by mouth every 4 (four) hours as needed for moderate pain. 60 tablet 0  . hydrocortisone (CORTEF) 10 MG tablet Take 25 mg by mouth as directed.     Marland Kitchen levothyroxine (SYNTHROID, LEVOTHROID) 112 MCG tablet     . metoprolol tartrate (LOPRESSOR) 25 MG tablet Take 25 mg by mouth once.    . Multiple Vitamin (MULTI-VITAMINS) TABS Take by mouth.    . ondansetron (ZOFRAN) 4 MG tablet Take 1 tablet (4 mg total) by mouth every 8 (eight) hours as needed for nausea or vomiting. 30 tablet 2  . pantoprazole (PROTONIX) 40 MG tablet Take 40 mg by mouth 2 (two) times daily.     Marland Kitchen venlafaxine (EFFEXOR) 75 MG tablet Take 75 mg by mouth at bedtime.    Marland Kitchen venlafaxine XR (EFFEXOR-XR) 150 MG 24 hr capsule 150 mg every morning.     . meloxicam (MOBIC) 7.5 MG tablet Take 1 tablet (7.5 mg total) by mouth daily. 30 tablet 0  . predniSONE (DELTASONE) 10 MG tablet Take as directed in ipilimumab Curt Bears) instructions. 120 tablet 0   No current facility-administered medications for this visit.     OBJECTIVE: Vitals:   02/04/16 0933  BP: 130/84  Pulse: 86  Resp: 18  Temp: 97.1 F (36.2 C)     Body mass index is 26.35 kg/m.    ECOG FS:0 - Asymptomatic  General: Well-developed, well-nourished, no acute distress. Eyes: Pink conjunctiva, anicteric sclera. HEENT: Normocephalic, moist mucous membranes, clear oropharnyx. Lungs: Clear to auscultation bilaterally. Heart: Regular rate and rhythm. No rubs, murmurs, or gallops. Abdomen: Soft, nontender, nondistended. No organomegaly noted, normoactive bowel sounds. Musculoskeletal: No edema, cyanosis, or clubbing. Neuro: Alert, answering all questions appropriately. Cranial nerves grossly intact. Skin: Rash noted on face, ears, and torso consistent with Zelboraf Psych: Normal affect.   LAB RESULTS:  Lab Results  Component Value Date   NA 137 02/04/2016   K 3.2 (L) 02/04/2016   CL 102 02/04/2016   CO2 25 02/04/2016   GLUCOSE 143 (H) 02/04/2016   BUN 22  (H) 02/04/2016   CREATININE 1.18 (H) 02/04/2016   CALCIUM 8.8 (L) 02/04/2016   PROT 6.7 02/04/2016   ALBUMIN 4.0 02/04/2016   AST 34 02/04/2016   ALT 20 02/04/2016   ALKPHOS 52 02/04/2016   BILITOT 0.4 02/04/2016   GFRNONAA 51 (L) 02/04/2016   GFRAA 59 (L) 02/04/2016    Lab Results  Component Value Date   WBC 4.4 02/04/2016   NEUTROABS 2.7 02/04/2016   HGB 13.9 02/04/2016   HCT 40.5 02/04/2016   MCV 91.4 02/04/2016   PLT 184 02/04/2016    STUDIES: Nm Pet Image Restage (ps) Whole Body  Result Date: 01/13/2016 CLINICAL DATA:  Subsequent treatment strategy for metastatic melanoma. EXAM: NUCLEAR MEDICINE PET WHOLE BODY TECHNIQUE: 13.1 mCi F-18 FDG was injected intravenously. Full-ring PET imaging was performed from the vertex to the feet after the radiotracer. CT data was obtained and used for attenuation correction  and anatomic localization. FASTING BLOOD GLUCOSE:  Value: 80 mg/dl COMPARISON:  Multiple prior PET scans. The most recent is 11/11/2015 FINDINGS: NECK No hypermetabolic lymph nodes in the neck. CHEST No hypermetabolic mediastinal or hilar nodes. No suspicious pulmonary nodules on the CT scan. ABDOMEN/PELVIS No abnormal hypermetabolic activity within the liver, pancreas, adrenal glands, or spleen. No hypermetabolic lymph nodes in the abdomen. Significant interval improvement of the right-sided pelvic lymphadenopathy. The previously enlarged and hypermetabolic right-sided external iliac lymph nodes have decreased in size. The largest node on image number 261 measures 23.5 x 20.5 mm and previously measured 35 x 30 mm. SUV max is 20.3. It was previously 21.3. Interval decrease in size of the right-sided inguinal lymph nodes. The largest node measures 11 mm on image number 272 and previously measured 15.5 mm. Weak hypermetabolism with SUV max of 3.2. This was previously 16.3. SKELETON No focal hypermetabolic activity to suggest skeletal metastasis. Other: No cutaneous or subcutaneous  hypermetabolic nodules are identified. Stable surgical scarring changes involving the right anterior thigh. IMPRESSION: 1. Significant interval improved appearance of the right-sided pelvic and inguinal lymphadenopathy, suggesting a good response to treatment. One persistent enlarged and hypermetabolic external iliac lymph node. 2. No new adenopathy or new metastatic lesions. Electronically Signed   By: Marijo Sanes M.D.   On: 01/13/2016 14:30    ONCOLOGIC HISTORY: Patient initially diagnosed with metastatic melanoma to right thigh with unknown primary in 2009. She received 4 weeks of high-dose interferon therapy in February 2009 and proceed with low-dose interferon therapy in March 2009. Patient remained in remission until August 2012 when she was noted to have recurrent melanoma and a pelvic lymph node. October 2012 she had 2 cycles of Ipilumimab which was discontinued secondary to poor tolerance. In April 2017 PET scan showed recurrent disease in the right inguinal region which was confirmed by biopsy. Zeboraf discontinued in November 2017 secondary to intolerable rash. Patient initiated combination immunotherapy with nivolumab and ipilumimab on January 14, 2016.  ASSESSMENT: Recurrent metastatic melanoma, BRAF+, of right lower extremity.  PLAN:    1. Recurrent metastatic melanoma, BRAF+, of right lower extremity:  PET scan results reviewed by Dr. Grayland Ormond reported as above with improvement of disease burden. Despite this, Zelboraf has been discontinued after persistent rash despite multiple dose reductions. After lengthy discussion with the patient to attempt another oral medication or oral combination such as dabrafenib/trametinib or cobimetinib, she has elected to pursue combination immunotherapy with nivolumab and ipilumimab. She will receive 1 mg/kg nivolumab + 3 mg/kg ipilumimab every 3 weeks for 4 doses at which point will consider maintenance treatment with 240 mg flat dose nivolumab every 2  weeks until disease progression or unacceptable toxicity. Previously patient had poor tolerance to ipilumimab but is tolerating well this time. Proceed with cycle 2 of 4 nivolumab and ipilumimab today. Return to clinic in 3 weeks for consideration of cycle 3. 2. Pain: Continue Vicodin as needed. Meloxicam was prescribed for body aches. This has helped in the past.  3. Nausea: Continue Compazine as needed. 4. Diarrhea: Continue Imodium OTC as needed  Approximately 30 minutes was spent in discussion of which greater than 50% was consultation.  Patient expressed understanding and was in agreement with this plan. She also understands that She can call clinic at any time with any questions, concerns, or complaints.   Faythe Casa, NP 02/04/2016 10:40 AM  Patient was seen and evaluated independently and I agree with the assessment and plan as dictated above. Will repeat  PET scan at the conclusion of cycle 4.  Lloyd Huger, MD 02/06/16 8:57 AM

## 2016-02-04 ENCOUNTER — Encounter: Payer: Self-pay | Admitting: Oncology

## 2016-02-04 ENCOUNTER — Other Ambulatory Visit: Payer: Self-pay

## 2016-02-04 ENCOUNTER — Inpatient Hospital Stay: Payer: Medicare Other

## 2016-02-04 ENCOUNTER — Inpatient Hospital Stay: Payer: Medicare Other | Attending: Oncology

## 2016-02-04 ENCOUNTER — Inpatient Hospital Stay (HOSPITAL_BASED_OUTPATIENT_CLINIC_OR_DEPARTMENT_OTHER): Payer: Medicare Other | Admitting: Oncology

## 2016-02-04 VITALS — BP 130/84 | HR 86 | Temp 97.1°F | Resp 18 | Wt 168.2 lb

## 2016-02-04 DIAGNOSIS — F17210 Nicotine dependence, cigarettes, uncomplicated: Secondary | ICD-10-CM | POA: Diagnosis not present

## 2016-02-04 DIAGNOSIS — M2550 Pain in unspecified joint: Secondary | ICD-10-CM | POA: Diagnosis not present

## 2016-02-04 DIAGNOSIS — K219 Gastro-esophageal reflux disease without esophagitis: Secondary | ICD-10-CM | POA: Insufficient documentation

## 2016-02-04 DIAGNOSIS — C4371 Malignant melanoma of right lower limb, including hip: Secondary | ICD-10-CM

## 2016-02-04 DIAGNOSIS — H9319 Tinnitus, unspecified ear: Secondary | ICD-10-CM

## 2016-02-04 DIAGNOSIS — C799 Secondary malignant neoplasm of unspecified site: Secondary | ICD-10-CM

## 2016-02-04 DIAGNOSIS — Z7952 Long term (current) use of systemic steroids: Secondary | ICD-10-CM

## 2016-02-04 DIAGNOSIS — Z79899 Other long term (current) drug therapy: Secondary | ICD-10-CM | POA: Diagnosis not present

## 2016-02-04 DIAGNOSIS — R197 Diarrhea, unspecified: Secondary | ICD-10-CM | POA: Insufficient documentation

## 2016-02-04 DIAGNOSIS — C439 Malignant melanoma of skin, unspecified: Secondary | ICD-10-CM

## 2016-02-04 DIAGNOSIS — R110 Nausea: Secondary | ICD-10-CM | POA: Insufficient documentation

## 2016-02-04 DIAGNOSIS — Z5112 Encounter for antineoplastic immunotherapy: Secondary | ICD-10-CM | POA: Insufficient documentation

## 2016-02-04 DIAGNOSIS — I509 Heart failure, unspecified: Secondary | ICD-10-CM

## 2016-02-04 DIAGNOSIS — Z800 Family history of malignant neoplasm of digestive organs: Secondary | ICD-10-CM | POA: Insufficient documentation

## 2016-02-04 LAB — CBC WITH DIFFERENTIAL/PLATELET
BASOS PCT: 1 %
Basophils Absolute: 0 10*3/uL (ref 0–0.1)
Eosinophils Absolute: 0.3 10*3/uL (ref 0–0.7)
Eosinophils Relative: 6 %
HEMATOCRIT: 40.5 % (ref 35.0–47.0)
HEMOGLOBIN: 13.9 g/dL (ref 12.0–16.0)
LYMPHS ABS: 1.1 10*3/uL (ref 1.0–3.6)
LYMPHS PCT: 25 %
MCH: 31.2 pg (ref 26.0–34.0)
MCHC: 34.2 g/dL (ref 32.0–36.0)
MCV: 91.4 fL (ref 80.0–100.0)
MONO ABS: 0.3 10*3/uL (ref 0.2–0.9)
MONOS PCT: 8 %
NEUTROS ABS: 2.7 10*3/uL (ref 1.4–6.5)
Neutrophils Relative %: 60 %
Platelets: 184 10*3/uL (ref 150–440)
RBC: 4.43 MIL/uL (ref 3.80–5.20)
RDW: 13.6 % (ref 11.5–14.5)
WBC: 4.4 10*3/uL (ref 3.6–11.0)

## 2016-02-04 LAB — COMPREHENSIVE METABOLIC PANEL
ALK PHOS: 52 U/L (ref 38–126)
ALT: 20 U/L (ref 14–54)
ANION GAP: 10 (ref 5–15)
AST: 34 U/L (ref 15–41)
Albumin: 4 g/dL (ref 3.5–5.0)
BILIRUBIN TOTAL: 0.4 mg/dL (ref 0.3–1.2)
BUN: 22 mg/dL — ABNORMAL HIGH (ref 6–20)
CALCIUM: 8.8 mg/dL — AB (ref 8.9–10.3)
CO2: 25 mmol/L (ref 22–32)
Chloride: 102 mmol/L (ref 101–111)
Creatinine, Ser: 1.18 mg/dL — ABNORMAL HIGH (ref 0.44–1.00)
GFR, EST AFRICAN AMERICAN: 59 mL/min — AB (ref >60)
GFR, EST NON AFRICAN AMERICAN: 51 mL/min — AB (ref >60)
Glucose, Bld: 143 mg/dL — ABNORMAL HIGH (ref 65–99)
POTASSIUM: 3.2 mmol/L — AB (ref 3.5–5.1)
Sodium: 137 mmol/L (ref 135–145)
TOTAL PROTEIN: 6.7 g/dL (ref 6.5–8.1)

## 2016-02-04 LAB — TSH: TSH: 0.199 u[IU]/mL — ABNORMAL LOW (ref 0.350–4.500)

## 2016-02-04 MED ORDER — SODIUM CHLORIDE 0.9 % IV SOLN
3.0000 mg/kg | Freq: Once | INTRAVENOUS | Status: AC
  Administered 2016-02-04: 225 mg via INTRAVENOUS
  Filled 2016-02-04: qty 40

## 2016-02-04 MED ORDER — SODIUM CHLORIDE 0.9 % IV SOLN
1.0000 mg/kg | Freq: Once | INTRAVENOUS | Status: AC
  Administered 2016-02-04: 70 mg via INTRAVENOUS
  Filled 2016-02-04: qty 7

## 2016-02-04 MED ORDER — MELOXICAM 7.5 MG PO TABS: 7.5000 mg | ORAL_TABLET | Freq: Every day | ORAL | 0 refills | Status: DC

## 2016-02-04 MED ORDER — SODIUM CHLORIDE 0.9 % IV SOLN
Freq: Once | INTRAVENOUS | Status: AC
  Administered 2016-02-04: 11:00:00 via INTRAVENOUS
  Filled 2016-02-04: qty 1000

## 2016-02-04 MED ORDER — PREDNISONE 10 MG PO TABS: ORAL_TABLET | ORAL | 0 refills | Status: DC

## 2016-02-04 NOTE — Progress Notes (Signed)
Offers no complaints. Tolerated last treatment well.

## 2016-02-05 LAB — T3, FREE: T3 FREE: 2.3 pg/mL (ref 2.0–4.4)

## 2016-02-05 LAB — T4: T4, Total: 8.3 ug/dL (ref 4.5–12.0)

## 2016-02-08 ENCOUNTER — Other Ambulatory Visit: Payer: Self-pay | Admitting: *Deleted

## 2016-02-08 DIAGNOSIS — C439 Malignant melanoma of skin, unspecified: Secondary | ICD-10-CM

## 2016-02-08 DIAGNOSIS — C799 Secondary malignant neoplasm of unspecified site: Secondary | ICD-10-CM

## 2016-02-08 MED ORDER — MELOXICAM 7.5 MG PO TABS: 7.5000 mg | ORAL_TABLET | Freq: Every day | ORAL | 0 refills | Status: DC

## 2016-02-09 DIAGNOSIS — M25531 Pain in right wrist: Secondary | ICD-10-CM | POA: Diagnosis not present

## 2016-02-09 DIAGNOSIS — M778 Other enthesopathies, not elsewhere classified: Secondary | ICD-10-CM | POA: Diagnosis not present

## 2016-02-10 ENCOUNTER — Telehealth: Payer: Self-pay | Admitting: *Deleted

## 2016-02-10 DIAGNOSIS — C439 Malignant melanoma of skin, unspecified: Secondary | ICD-10-CM

## 2016-02-10 DIAGNOSIS — C799 Secondary malignant neoplasm of unspecified site: Secondary | ICD-10-CM

## 2016-02-10 MED ORDER — LEVOFLOXACIN 500 MG PO TABS: 500.0000 mg | ORAL_TABLET | Freq: Every day | ORAL | 0 refills | Status: DC

## 2016-02-10 NOTE — Telephone Encounter (Signed)
She can have levaquin 500mg  PO x5 days.

## 2016-02-10 NOTE — Telephone Encounter (Signed)
Pt aware.

## 2016-02-10 NOTE — Telephone Encounter (Signed)
Pt called to inform Dr. Grayland Ormond that has chest congestion with productive cough and green sputum, denies fever. Requests antibiotic. Please advise.

## 2016-02-11 DIAGNOSIS — I5022 Chronic systolic (congestive) heart failure: Secondary | ICD-10-CM | POA: Diagnosis not present

## 2016-02-11 DIAGNOSIS — I10 Essential (primary) hypertension: Secondary | ICD-10-CM | POA: Diagnosis not present

## 2016-02-11 DIAGNOSIS — I340 Nonrheumatic mitral (valve) insufficiency: Secondary | ICD-10-CM | POA: Diagnosis not present

## 2016-02-13 DIAGNOSIS — M778 Other enthesopathies, not elsewhere classified: Secondary | ICD-10-CM | POA: Insufficient documentation

## 2016-02-16 ENCOUNTER — Telehealth: Payer: Self-pay | Admitting: *Deleted

## 2016-02-16 DIAGNOSIS — H60543 Acute eczematoid otitis externa, bilateral: Secondary | ICD-10-CM | POA: Diagnosis not present

## 2016-02-16 DIAGNOSIS — H903 Sensorineural hearing loss, bilateral: Secondary | ICD-10-CM | POA: Diagnosis not present

## 2016-02-16 NOTE — Telephone Encounter (Signed)
Left message with pt to callback by Thursday if not any better. Pt instructed to continue OTC medications at this time.

## 2016-02-16 NOTE — Telephone Encounter (Signed)
Continue OTC treatments.  Consider CXR if no improvement be the end of the week.

## 2016-02-16 NOTE — Telephone Encounter (Signed)
Pt has finished antibiotics and continues to have productive cough that is worse at night. Please advise.

## 2016-02-19 DIAGNOSIS — Z79899 Other long term (current) drug therapy: Secondary | ICD-10-CM | POA: Diagnosis not present

## 2016-02-19 DIAGNOSIS — R7989 Other specified abnormal findings of blood chemistry: Secondary | ICD-10-CM | POA: Diagnosis not present

## 2016-02-19 DIAGNOSIS — N183 Chronic kidney disease, stage 3 (moderate): Secondary | ICD-10-CM | POA: Diagnosis not present

## 2016-02-19 DIAGNOSIS — K219 Gastro-esophageal reflux disease without esophagitis: Secondary | ICD-10-CM | POA: Diagnosis not present

## 2016-02-19 DIAGNOSIS — Z85820 Personal history of malignant melanoma of skin: Secondary | ICD-10-CM | POA: Diagnosis not present

## 2016-02-19 DIAGNOSIS — G4483 Primary cough headache: Secondary | ICD-10-CM | POA: Diagnosis not present

## 2016-02-19 DIAGNOSIS — E230 Hypopituitarism: Secondary | ICD-10-CM | POA: Diagnosis not present

## 2016-02-19 DIAGNOSIS — Z9221 Personal history of antineoplastic chemotherapy: Secondary | ICD-10-CM | POA: Diagnosis not present

## 2016-02-19 DIAGNOSIS — R0600 Dyspnea, unspecified: Secondary | ICD-10-CM | POA: Diagnosis not present

## 2016-02-19 DIAGNOSIS — R0602 Shortness of breath: Secondary | ICD-10-CM | POA: Diagnosis not present

## 2016-02-19 DIAGNOSIS — R05 Cough: Secondary | ICD-10-CM | POA: Diagnosis not present

## 2016-02-19 DIAGNOSIS — F17210 Nicotine dependence, cigarettes, uncomplicated: Secondary | ICD-10-CM | POA: Diagnosis not present

## 2016-02-19 DIAGNOSIS — F329 Major depressive disorder, single episode, unspecified: Secondary | ICD-10-CM | POA: Diagnosis not present

## 2016-02-20 DIAGNOSIS — R0600 Dyspnea, unspecified: Secondary | ICD-10-CM | POA: Diagnosis not present

## 2016-02-23 ENCOUNTER — Telehealth: Payer: Self-pay | Admitting: *Deleted

## 2016-02-23 NOTE — Telephone Encounter (Signed)
Pt went to ED at [facility] on 12/23 due to difficulty breathing. Pt had CT scan performed and would like for Dr. Grayland Ormond to review report. Report is in [place]. Pt has follow up on Thurs 02/25/16 and would like to review these results with you at that visit.

## 2016-02-23 NOTE — Telephone Encounter (Signed)
Pt called to notify our office that is having swelling in bilateral wrists and knees. States is very painful to move or bend wrists and knees. Please advise.

## 2016-02-23 NOTE — Telephone Encounter (Signed)
Not sure what is causing it. Does she need to come I to be seen?

## 2016-02-23 NOTE — Progress Notes (Signed)
[facility]  [phone] [phone]  ID: Patricia Li OB: 12-24-1960  MR#: [id number]  TDS#:[id number]  Patient Care Team: Donnie Mesa, MD as PCP - General (Family Medicine) Robert Bellow, MD as Consulting Physician (General Surgery) Forest Gleason, MD (Oncology)  CHIEF COMPLAINT: Recurrent metastatic melanoma, BRAF+, of right lower extremity.  INTERVAL HISTORY: Patient returns to clinic today for further evaluation and treatment of cycle 3 of 4 nivolumab and ipilumimab today. She is having painful and swollen joints on her hands and feet. She does not complain of tinnitus today. She has no other neurological complaints. She denies any recent fevers or illnesses. She has a good appetite and denies weight loss. She denies any chest pain or shortness of breath. She has no nausea, vomiting, or constipation. She has no urinary complaints. Patient otherwise feels well and offers no further specific complaints.   REVIEW OF SYSTEMS:   Review of Systems  Constitutional: Negative.  Negative for fever, malaise/fatigue and weight loss.  HENT: Negative.  Negative for ear pain and tinnitus (Began prior to immunotherapy).   Eyes: Negative.   Respiratory: Negative for cough and shortness of breath.   Cardiovascular: Positive for leg swelling. Negative for chest pain.  Gastrointestinal: Negative for abdominal pain, blood in stool and diarrhea.  Genitourinary: Negative.   Musculoskeletal: Positive for joint pain.       Right inguinal pain.  Skin: Negative for itching and rash.  Neurological: Negative.  Negative for sensory change and weakness.  Endo/Heme/Allergies: Does not bruise/bleed easily.  Psychiatric/Behavioral: The patient is nervous/anxious and has insomnia.     As per HPI. Otherwise, a complete review of systems is negative.  PAST MEDICAL HISTORY: Past Medical History:  Diagnosis Date  . CHF (congestive heart failure) ([place]) 2016   Dr.  Nehemiah Massed  . GERD (gastroesophageal reflux disease) 2012  . Lumbar herniated disc 2010  . Malignant melanoma of skin of lower limb, including hip ([place]) 2008, 2017   right thigh  . Personal history of malignant melanoma of skin 2012  . Tick bite of head 07/20/2015    PAST SURGICAL HISTORY: Past Surgical History:  Procedure Laterality Date  . BACK SURGERY  2010  . BREAST CYST EXCISION  1985  . COLONOSCOPY  07/2012   [facility]  . FRACTURE SURGERY     rod removed from left femur 07/21/2014  . INGUINAL LYMPH NODE BIOPSY Right 07/17/2015   Procedure: INGUINAL LYMPH NODE BIOPSY/EXCISION;  Surgeon: Robert Bellow, MD;  Location: [facility] ORS;  Service: General;  Laterality: Right;  . MELANOMA EXCISION  2008   right thigh  . SKIN LESION EXCISION Right 12-19-10   posterior    FAMILY HISTORY: Family History  Problem Relation Age of Onset  . COPD Father   . Cancer Sister     Colon, sister       ADVANCED DIRECTIVES:    HEALTH MAINTENANCE: Social History  Substance Use Topics  . Smoking status: Current Every Day Smoker    Packs/day: 0.50    Years: 20.00    Types: Cigarettes  . Smokeless tobacco: Never Used  . Alcohol use 0.0 oz/week     Comment: ocassionally     Colonoscopy:  PAP:  Bone density:  Lipid panel:  Allergies  Allergen Reactions  . Clindamycin Shortness Of Breath  . Aripiprazole Other (See Comments)    Facial and head ticks  . Valproic Acid     Other reaction(s): Other (See Comments) Facial twitching  Current Outpatient Prescriptions  Medication Sig Dispense Refill  . calcium carbonate (OS-CAL) 600 MG TABS tablet Take 600 mg by mouth 2 (two) times daily with a meal.    . clonazePAM (KLONOPIN) 1 MG tablet Take 1 mg by mouth at bedtime.    . furosemide (LASIX) 20 MG tablet Take 20 mg by mouth every other day.     Marland Kitchen HYDROcodone-acetaminophen (NORCO/VICODIN) 5-325 MG tablet Take 1 tablet by mouth every 4 (four) hours as needed for moderate pain. 60 tablet 0  .  hydrocortisone (CORTEF) 10 MG tablet Take 25 mg by mouth as directed.     Marland Kitchen levothyroxine (SYNTHROID, LEVOTHROID) 112 MCG tablet     . meloxicam (MOBIC) 7.5 MG tablet Take 1 tablet (7.5 mg total) by mouth daily. 30 tablet 0  . metoprolol tartrate (LOPRESSOR) 25 MG tablet Take 25 mg by mouth once.    . Multiple Vitamin (MULTI-VITAMINS) TABS Take by mouth.    . ondansetron (ZOFRAN) 4 MG tablet Take 1 tablet (4 mg total) by mouth every 8 (eight) hours as needed for nausea or vomiting. 30 tablet 2  . pantoprazole (PROTONIX) 40 MG tablet Take 40 mg by mouth 2 (two) times daily.     . predniSONE (DELTASONE) 10 MG tablet Take as directed in ipilimumab Curt Bears) instructions. 120 tablet 0  . venlafaxine (EFFEXOR) 75 MG tablet Take 75 mg by mouth at bedtime.    Marland Kitchen venlafaxine XR (EFFEXOR-XR) 150 MG 24 hr capsule 150 mg every morning.      No current facility-administered medications for this visit.     OBJECTIVE: Vitals:   02/25/16 0903  BP: (!) 143/76  Pulse: 73  Resp: 18  Temp: (!) 96.7 F (35.9 C)     Body mass index is 27.26 kg/m.    ECOG FS:0 - Asymptomatic  General: Well-developed, well-nourished, no acute distress. Eyes: Pink conjunctiva, anicteric sclera. HEENT: Normocephalic, moist mucous membranes, clear oropharnyx. Lungs: Clear to auscultation bilaterally. Heart: Regular rate and rhythm. No rubs, murmurs, or gallops. Abdomen: Soft, nontender, nondistended. No organomegaly noted, normoactive bowel sounds. Musculoskeletal: No edema, cyanosis, or clubbing. No obvious swelling of joints, no decrease in strength. Moderate tenderness to touch. Neuro: Alert, answering all questions appropriately. Cranial nerves grossly intact. Skin: Rash noted on face, ears, and torso consistent with Zelboraf Psych: Normal affect.   LAB RESULTS:  Lab Results  Component Value Date   NA 134 (L) 02/25/2016   K 4.1 02/25/2016   CL 96 (L) 02/25/2016   CO2 32 02/25/2016   GLUCOSE 81 02/25/2016   BUN  28 (H) 02/25/2016   CREATININE 2.05 (H) 02/25/2016   CALCIUM 9.4 02/25/2016   PROT 6.2 (L) 02/25/2016   ALBUMIN 3.4 (L) 02/25/2016   AST 29 02/25/2016   ALT 17 02/25/2016   ALKPHOS 61 02/25/2016   BILITOT 0.5 02/25/2016   GFRNONAA 26 (L) 02/25/2016   GFRAA 31 (L) 02/25/2016    Lab Results  Component Value Date   WBC 9.5 02/25/2016   NEUTROABS 7.0 (H) 02/25/2016   HGB 12.5 02/25/2016   HCT 36.8 02/25/2016   MCV 91.5 02/25/2016   PLT 332 02/25/2016    STUDIES: No results found.  ONCOLOGIC HISTORY: Patient initially diagnosed with metastatic melanoma to right thigh with unknown primary in 2009. She received 4 weeks of high-dose interferon therapy in February 2009 and proceed with low-dose interferon therapy in March 2009. Patient remained in remission until August 2012 when she was noted to have recurrent melanoma  and a pelvic lymph node. October 2012 she had 2 cycles of Ipilumimab which was discontinued secondary to poor tolerance. In April 2017 PET scan showed recurrent disease in the right inguinal region which was confirmed by biopsy. Zeboraf discontinued in November 2017 secondary to intolerable rash. Patient initiated combination immunotherapy with nivolumab and ipilumimab on January 14, 2016.  ASSESSMENT: Recurrent metastatic melanoma, BRAF+, of right lower extremity.  PLAN:    1. Recurrent metastatic melanoma, BRAF+, of right lower extremity:  PET scan results After a brief treatment with Zelboraf reviewed Independently with improvement of disease burden. Despite this, Zelboraf was been discontinued after persistent rash despite multiple dose reductions. After lengthy discussion with the patient to attempt another oral medication or oral combination such as dabrafenib/trametinib or cobimetinib, she has elected to pursue combination immunotherapy with nivolumab and ipilumimab. She will receive 1 mg/kg nivolumab + 3 mg/kg ipilumimab every 3 weeks for 4 doses at which point will  consider maintenance treatment with 240 mg flat dose nivolumab every 2 weeks until disease progression or unacceptable toxicity. Previously patient had poor tolerance to ipilumimab but is tolerating well this time. Proceed with cycle 3 of 4 nivolumab and ipilumimab today. Return to clinic in 3 weeks for consideration of cycle 4. Patient has requested port placement. Repeat PET scan after the conclusion of cycle 4. 2. Pain: Continue Vicodin as needed. Meloxicam was prescribed for body aches. She joint pain may be inflammatory secondary to immunotherapy, therefore she was given a prescription for prednisone 10 mg daily for 7 days. She has been instructed to call clinic at the end of 7 days to determine whether to discontinue treatment or increased dosing.  3. Nausea: Continue Compazine as needed. 4. Diarrhea: Continue Imodium OTC as needed  Approximately 30 minutes was spent in discussion of which greater than 50% was consultation.  Patient expressed understanding and was in agreement with this plan. She also understands that She can call clinic at any time with any questions, concerns, or complaints.    Lloyd Huger, MD 02/25/16 9:30 AM

## 2016-02-23 NOTE — Telephone Encounter (Signed)
Pt agreed to monitor swelling until appt on Thursday. States that swelling may be from taking 5 days of levaquin recently. Pt was instructed to monitor swelling and to call our office back if symptoms worsen. Pt verbalized understanding.

## 2016-02-23 NOTE — Telephone Encounter (Signed)
Pt has appt on Thursday for treatment.

## 2016-02-25 ENCOUNTER — Inpatient Hospital Stay: Payer: Medicare Other

## 2016-02-25 ENCOUNTER — Other Ambulatory Visit: Payer: Self-pay

## 2016-02-25 ENCOUNTER — Inpatient Hospital Stay (HOSPITAL_BASED_OUTPATIENT_CLINIC_OR_DEPARTMENT_OTHER): Payer: Medicare Other | Admitting: Oncology

## 2016-02-25 VITALS — BP 143/76 | HR 73 | Temp 96.7°F | Resp 18 | Wt 174.1 lb

## 2016-02-25 DIAGNOSIS — C439 Malignant melanoma of skin, unspecified: Secondary | ICD-10-CM

## 2016-02-25 DIAGNOSIS — R197 Diarrhea, unspecified: Secondary | ICD-10-CM | POA: Diagnosis not present

## 2016-02-25 DIAGNOSIS — Z5112 Encounter for antineoplastic immunotherapy: Secondary | ICD-10-CM | POA: Diagnosis not present

## 2016-02-25 DIAGNOSIS — R110 Nausea: Secondary | ICD-10-CM

## 2016-02-25 DIAGNOSIS — C799 Secondary malignant neoplasm of unspecified site: Secondary | ICD-10-CM

## 2016-02-25 DIAGNOSIS — Z79899 Other long term (current) drug therapy: Secondary | ICD-10-CM

## 2016-02-25 DIAGNOSIS — F17210 Nicotine dependence, cigarettes, uncomplicated: Secondary | ICD-10-CM

## 2016-02-25 DIAGNOSIS — C4371 Malignant melanoma of right lower limb, including hip: Secondary | ICD-10-CM | POA: Diagnosis not present

## 2016-02-25 DIAGNOSIS — H9319 Tinnitus, unspecified ear: Secondary | ICD-10-CM | POA: Diagnosis not present

## 2016-02-25 DIAGNOSIS — Z7952 Long term (current) use of systemic steroids: Secondary | ICD-10-CM

## 2016-02-25 DIAGNOSIS — M2550 Pain in unspecified joint: Secondary | ICD-10-CM | POA: Diagnosis not present

## 2016-02-25 DIAGNOSIS — I509 Heart failure, unspecified: Secondary | ICD-10-CM

## 2016-02-25 DIAGNOSIS — Z800 Family history of malignant neoplasm of digestive organs: Secondary | ICD-10-CM

## 2016-02-25 DIAGNOSIS — K219 Gastro-esophageal reflux disease without esophagitis: Secondary | ICD-10-CM

## 2016-02-25 LAB — CBC WITH DIFFERENTIAL/PLATELET
Basophils Absolute: 0 10*3/uL (ref 0–0.1)
Basophils Relative: 1 %
EOS PCT: 5 %
Eosinophils Absolute: 0.5 10*3/uL (ref 0–0.7)
HCT: 36.8 % (ref 35.0–47.0)
Hemoglobin: 12.5 g/dL (ref 12.0–16.0)
LYMPHS ABS: 1.5 10*3/uL (ref 1.0–3.6)
LYMPHS PCT: 16 %
MCH: 31.1 pg (ref 26.0–34.0)
MCHC: 34 g/dL (ref 32.0–36.0)
MCV: 91.5 fL (ref 80.0–100.0)
MONOS PCT: 5 %
Monocytes Absolute: 0.5 10*3/uL (ref 0.2–0.9)
Neutro Abs: 7 10*3/uL — ABNORMAL HIGH (ref 1.4–6.5)
Neutrophils Relative %: 73 %
PLATELETS: 332 10*3/uL (ref 150–440)
RBC: 4.02 MIL/uL (ref 3.80–5.20)
RDW: 13.5 % (ref 11.5–14.5)
WBC: 9.5 10*3/uL (ref 3.6–11.0)

## 2016-02-25 LAB — COMPREHENSIVE METABOLIC PANEL
ALK PHOS: 61 U/L (ref 38–126)
ALT: 17 U/L (ref 14–54)
AST: 29 U/L (ref 15–41)
Albumin: 3.4 g/dL — ABNORMAL LOW (ref 3.5–5.0)
Anion gap: 6 (ref 5–15)
BUN: 28 mg/dL — ABNORMAL HIGH (ref 6–20)
CALCIUM: 9.4 mg/dL (ref 8.9–10.3)
CHLORIDE: 96 mmol/L — AB (ref 101–111)
CO2: 32 mmol/L (ref 22–32)
CREATININE: 2.05 mg/dL — AB (ref 0.44–1.00)
GFR calc Af Amer: 31 mL/min — ABNORMAL LOW (ref >60)
GFR, EST NON AFRICAN AMERICAN: 26 mL/min — AB (ref >60)
Glucose, Bld: 81 mg/dL (ref 65–99)
Potassium: 4.1 mmol/L (ref 3.5–5.1)
Sodium: 134 mmol/L — ABNORMAL LOW (ref 135–145)
Total Bilirubin: 0.5 mg/dL (ref 0.3–1.2)
Total Protein: 6.2 g/dL — ABNORMAL LOW (ref 6.5–8.1)

## 2016-02-25 LAB — TSH: TSH: 0.151 u[IU]/mL — ABNORMAL LOW (ref 0.350–4.500)

## 2016-02-25 MED ORDER — SODIUM CHLORIDE 0.9 % IV SOLN
1.0000 mg/kg | Freq: Once | INTRAVENOUS | Status: AC
  Administered 2016-02-25: 70 mg via INTRAVENOUS
  Filled 2016-02-25: qty 7

## 2016-02-25 MED ORDER — SODIUM CHLORIDE 0.9 % IV SOLN
Freq: Once | INTRAVENOUS | Status: AC
  Administered 2016-02-25: 11:00:00 via INTRAVENOUS
  Filled 2016-02-25: qty 1000

## 2016-02-25 MED ORDER — SODIUM CHLORIDE 0.9 % IV SOLN
3.0000 mg/kg | Freq: Once | INTRAVENOUS | Status: AC
  Administered 2016-02-25: 225 mg via INTRAVENOUS
  Filled 2016-02-25: qty 5

## 2016-02-25 MED ORDER — PREDNISONE 10 MG PO TABS: 10.0000 mg | ORAL_TABLET | Freq: Every day | ORAL | 0 refills | Status: DC

## 2016-02-25 NOTE — Progress Notes (Signed)
Patient receiving Yervoy and Opdivo. Past SCr has been 1.19 and 1.18, however this treatment SCr is 2.05. Per package insert recommendation is to hold treatment with opdivo if SCr greater than 1.5. Spoke with MD who stated to proceed with treatment.

## 2016-02-25 NOTE — Progress Notes (Signed)
Complains of severe pain in wrists, hands, knees, ankles, and toes. States that pain is worse with increased activity and has noticed more swelling around joints.

## 2016-02-26 LAB — T3, FREE: T3, Free: 2.1 pg/mL (ref 2.0–4.4)

## 2016-02-26 LAB — T4: T4, Total: 6.4 ug/dL (ref 4.5–12.0)

## 2016-03-07 ENCOUNTER — Telehealth: Payer: Self-pay | Admitting: *Deleted

## 2016-03-07 DIAGNOSIS — C799 Secondary malignant neoplasm of unspecified site: Secondary | ICD-10-CM

## 2016-03-07 DIAGNOSIS — C439 Malignant melanoma of skin, unspecified: Secondary | ICD-10-CM

## 2016-03-07 MED ORDER — DEXAMETHASONE 4 MG PO TABS: 4.0000 mg | ORAL_TABLET | Freq: Every day | ORAL | 1 refills | Status: DC

## 2016-03-07 NOTE — Telephone Encounter (Signed)
Pt called to give update. Finished prednisone prescription and pain/swelling returned after 2 days. Meloxicam works if takes 2 in the morning rather than 1 tablet BID. By the evening joint stiffness/inflammation worsen. Pt wants to know if there is a stronger NSAID to take that would help her joint stiffness/inflammation all day. If she is to remain on meloxicam then she will need a refill as well.

## 2016-03-07 NOTE — Telephone Encounter (Signed)
Lets try a low-dose steroid 4 mg dexamethasone starting once a day and go from there.

## 2016-03-07 NOTE — Telephone Encounter (Signed)
Prescription escribed. Message left with patient with MD recommendations. Instructed to callback if has further questions.

## 2016-03-09 ENCOUNTER — Encounter: Payer: Self-pay | Admitting: General Surgery

## 2016-03-09 ENCOUNTER — Ambulatory Visit (INDEPENDENT_AMBULATORY_CARE_PROVIDER_SITE_OTHER): Payer: Medicare Other | Admitting: General Surgery

## 2016-03-09 VITALS — BP 130/76 | HR 74 | Resp 12 | Ht 64.0 in | Wt 169.0 lb

## 2016-03-09 DIAGNOSIS — C4371 Malignant melanoma of right lower limb, including hip: Secondary | ICD-10-CM

## 2016-03-09 DIAGNOSIS — R590 Localized enlarged lymph nodes: Secondary | ICD-10-CM | POA: Diagnosis not present

## 2016-03-09 NOTE — Patient Instructions (Signed)
Implanted Port Insertion An implanted port is a central line that has a round shape and is placed under the skin. It is used as a long-term IV access for:   Medicines, such as chemotherapy.   Fluids.   Liquid nutrition, such as total parenteral nutrition (TPN).   Blood samples.  LET YOUR HEALTH CARE PROVIDER KNOW ABOUT:  Allergies to food or medicine.   Medicines taken, including vitamins, herbs, eye drops, creams, and over-the-counter medicines.   Any allergies to heparin.  Use of steroids (by mouth or creams).   Previous problems with anesthetics or numbing medicines.   History of bleeding problems or blood clots.   Previous surgery.   Other health problems, including diabetes and kidney problems.   Possibility of pregnancy, if this applies. RISKS AND COMPLICATIONS Generally, this is a safe procedure. However, as with any procedure, problems can occur. Possible problems include:  Damage to the blood vessel, bruising, or bleeding at the puncture site.   Infection.  Blood clot in the vessel that the port is in.  Breakdown of the skin over your port.  Very rarely a person may develop a condition called a pneumothorax, a collection of air in the chest that may cause one of the lungs to collapse. The placement of these catheters with the appropriate imaging guidance significantly decreases the risk of a pneumothorax.  BEFORE THE PROCEDURE   Your health care provider may want you to have blood tests. These tests can help tell how well your kidneys and liver are working. They can also show how well your blood clots.   If you take blood thinners (anticoagulant medicines), ask your health care provider when you should stop taking them.   Make arrangements for someone to drive you home. This is necessary if you have been sedated for your procedure.  PROCEDURE  Port insertion usually takes about 30-45 minutes.   An IV needle will be inserted in your arm.  Medicine for pain and medicine to help relax you (sedative) will flow directly into your body through this needle.   You will lie on an exam table, and you will be connected to monitors to keep track of your heart rate, blood pressure, and breathing throughout the procedure.  An oxygen monitoring device may be attached to your finger. Oxygen will be given.   Everything will be kept as germ free (sterile) as possible during the procedure. The skin near the point of the incision will be cleansed with antiseptic, and the area will be draped with sterile towels. The skin and deeper tissues over the port area will be made numb with a local anesthetic.  Two small cuts (incisions) will be made in the skin to insert the port. One will be made in the neck to obtain access to the vein where the catheter will lie.   Because the port reservoir will be placed under the skin, a small skin incision will be made in the upper chest, and a small pocket for the port will be made under the skin. The catheter that will be connected to the port tunnels to a large central vein in the chest. A small, raised area will remain on your body at the site of the reservoir when the procedure is complete.  The port placement will be done under imaging guidance to ensure the proper placement.  The reservoir has a silicone covering that can be punctured with a special needle.   The port will be flushed with normal   saline, and blood will be drawn to make sure it is working properly.  There will be nothing remaining outside the skin when the procedure is finished.   Incisions will be held together by stitches, surgical glue, or a special tape. AFTER THE PROCEDURE  You will stay in a recovery area until the anesthesia has worn off. Your blood pressure and pulse will be checked.  A final chest X-ray will be taken to check the placement of the port and to ensure that there is no injury to your lung. This information is not  intended to replace advice given to you by your health care provider. Make sure you discuss any questions you have with your health care provider. Document Released: 12/05/2012 Document Revised: 03/07/2014 Document Reviewed: 12/05/2012 Elsevier Interactive Patient Education  2017 [employer] Inc.  

## 2016-03-09 NOTE — Progress Notes (Signed)
Patient ID: Patricia Li, female   DOB: 01-18-1961, 56 y.o.   MRN: [id number]  Chief Complaint  Patient presents with  . Other    discuss port placement    HPI Patricia Li is a 56 y.o. female here today to discuss port placement. Sister Patricia Li present.  The patient is receiving ongoing immunotherapy for her metastatic melanoma. This is been associated with episodic swelling making venous access more difficult. She is given beyond 8 every other week treatment regimen and a port has been recommended to expedite her treatment. HPI  Past Medical History:  Diagnosis Date  . CHF (congestive heart failure) ([place]) 2016   Dr. Nehemiah Massed  . GERD (gastroesophageal reflux disease) 2012  . Lumbar herniated disc 2010  . Malignant melanoma of skin of lower limb, including hip ([place]) 2008, 2017   right thigh  . Personal history of malignant melanoma of skin 2012  . Tick bite of head 07/20/2015    Past Surgical History:  Procedure Laterality Date  . BACK SURGERY  2010  . BREAST CYST EXCISION  1985  . COLONOSCOPY  07/2012   [facility]  . FRACTURE SURGERY     rod removed from left femur 07/21/2014  . INGUINAL LYMPH NODE BIOPSY Right 07/17/2015   Procedure: INGUINAL LYMPH NODE BIOPSY/EXCISION;  Surgeon: Robert Bellow, MD;  Location: [facility] ORS;  Service: General;  Laterality: Right;  . MELANOMA EXCISION  2008   right thigh  . SKIN LESION EXCISION Right 12-19-10   posterior    Family History  Problem Relation Age of Onset  . COPD Father   . Cancer Sister     Colon, sister    Social History Social History  Substance Use Topics  . Smoking status: Current Every Day Smoker    Packs/day: 0.50    Years: 20.00    Types: Cigarettes  . Smokeless tobacco: Never Used  . Alcohol use 0.0 oz/week     Comment: ocassionally    Allergies  Allergen Reactions  . Clindamycin Shortness Of Breath  . Aripiprazole Other (See Comments)    Facial and head ticks  . Valproic Acid     Other  reaction(s): Other (See Comments) Facial twitching    Current Outpatient Prescriptions  Medication Sig Dispense Refill  . calcium carbonate (OS-CAL) 600 MG TABS tablet Take 600 mg by mouth 2 (two) times daily with a meal.    . clonazePAM (KLONOPIN) 1 MG tablet Take 1 mg by mouth at bedtime.    Marland Kitchen dexamethasone (DECADRON) 4 MG tablet Take 1 tablet (4 mg total) by mouth daily. 30 tablet 1  . furosemide (LASIX) 20 MG tablet Take 20 mg by mouth every other day.     Marland Kitchen HYDROcodone-acetaminophen (NORCO/VICODIN) 5-325 MG tablet Take 1 tablet by mouth every 4 (four) hours as needed for moderate pain. 60 tablet 0  . hydrocortisone (CORTEF) 10 MG tablet Take 25 mg by mouth as directed.     Marland Kitchen levothyroxine (SYNTHROID, LEVOTHROID) 112 MCG tablet     . meloxicam (MOBIC) 7.5 MG tablet Take 1 tablet (7.5 mg total) by mouth daily. 30 tablet 0  . metoprolol tartrate (LOPRESSOR) 25 MG tablet Take 25 mg by mouth once.    . Multiple Vitamin (MULTI-VITAMINS) TABS Take by mouth.    . ondansetron (ZOFRAN) 4 MG tablet Take 1 tablet (4 mg total) by mouth every 8 (eight) hours as needed for nausea or vomiting. 30 tablet 2  . pantoprazole (PROTONIX) 40 MG  tablet Take 40 mg by mouth 2 (two) times daily.     . predniSONE (DELTASONE) 10 MG tablet Take as directed in ipilimumab Curt Bears) instructions. 120 tablet 0  . predniSONE (DELTASONE) 10 MG tablet Take 1 tablet (10 mg total) by mouth daily with breakfast. 30 tablet 0  . venlafaxine (EFFEXOR) 75 MG tablet Take 75 mg by mouth at bedtime.    Marland Kitchen venlafaxine XR (EFFEXOR-XR) 150 MG 24 hr capsule 150 mg every morning.      No current facility-administered medications for this visit.     Review of Systems Review of Systems  Constitutional: Negative.   Respiratory: Negative.   Cardiovascular: Negative.     Blood pressure 130/76, pulse 74, resp. rate 12, height 5\' 4"  (1.626 m), weight 169 lb (76.7 kg).  Physical Exam Physical Exam  Constitutional: She appears  well-developed and well-nourished.  HENT:  Head: Normocephalic and atraumatic.  Eyes: Pupils are equal, round, and reactive to light.  Neck: Neck supple.  Cardiovascular: Normal rate and regular rhythm.   No evidence of upper extremity venous compromise.  Pulmonary/Chest: Effort normal and breath sounds normal.  Abdominal: Soft.  Musculoskeletal:       Legs:   Data Reviewed Medical oncology notes.  Assessment    Candidate for PowerPort placement.    Plan    The risks associated with central venous access including arterial, pulmonary and venous injury were reviewed. The possible need for additional treatment if pulmonary injury occurs (chest tube placement) was discussed.  The patient is right handed and for that reason we'll plan to make use of a left subclavian access.   Patient to be scheduled for port placement.   Patient's surgery has been scheduled for 03-14-16 at [facility].     This information has been scribed by Gaspar Cola CMA.     Robert Bellow 03/09/2016, 10:11 AM

## 2016-03-11 ENCOUNTER — Encounter
Admission: RE | Admit: 2016-03-11 | Discharge: 2016-03-11 | Disposition: A | Discharge status: Home / Self Care | Payer: Medicare Other | Source: Ambulatory Visit | Attending: General Surgery | Admitting: General Surgery

## 2016-03-11 HISTORY — DX: Enthesopathy, unspecified: M77.9

## 2016-03-11 HISTORY — DX: Hypothyroidism, unspecified: E03.9

## 2016-03-11 NOTE — Patient Instructions (Signed)
  Your procedure is scheduled on: 03/14/16 Mon Report to Same Day Surgery 2nd floor medical mall [facility] Entrance-take elevator on left to 2nd floor.  Check in with surgery information desk.) To find out your arrival time @ 8:30 am Remember: Instructions that are not followed completely may result in serious medical risk, up to and including death, or upon the discretion of your surgeon and anesthesiologist your surgery may need to be rescheduled.    _x___ 1. Do not eat food or drink liquids after midnight. No gum chewing or hard candies.     __x__ 2. No Alcohol for 24 hours before or after surgery.   __x__3. No Smoking for 24 prior to surgery.   ____  4. Bring all medications with you on the day of surgery if instructed.    __x__ 5. Notify your doctor if there is any change in your medical condition     (cold, fever, infections).     Do not wear jewelry, make-up, hairpins, clips or nail polish.  Do not wear lotions, powders, or perfumes. You may wear deodorant.  Do not shave 48 hours prior to surgery. Men may shave face and neck.  Do not bring valuables to the hospital.    [facility] is not responsible for any belongings or valuables.               Contacts, dentures or bridgework may not be worn into surgery.  Leave your suitcase in the car. After surgery it may be brought to your room.  For patients admitted to the hospital, discharge time is determined by your treatment team.   Patients discharged the day of surgery will not be allowed to drive home.  You will need someone to drive you home and stay with you the night of your procedure.    Please read over the following fact sheets that you were given:   [facility] Preparing for Surgery and or MRSA Information   _x___ Take these medicines the morning of surgery with A SIP OF WATER:    1. hydrocortisone (CORTEF  2.levothyroxine (SYNTHROID, LEVOTHROID)   3.metoprolol tartrate (LOPRESSOR  4.pantoprazole  (PROTONIX  5.venlafaxine XR (EFFEXOR-XR  6.  ____Fleets enema or Magnesium Citrate as directed.   _x___ Use CHG Soap or sage wipes as directed on instruction sheet   ____ Use inhalers on the day of surgery and bring to hospital day of surgery  ____ Stop metformin 2 days prior to surgery    ____ Take 1/2 of usual insulin dose the night before surgery and none on the morning of           surgery.   ____ Stop Aspirin, Coumadin, Pllavix ,Eliquis, Effient, or Pradaxa  x__ Stop Anti-inflammatories such as Advil, Aleve, Ibuprofen, Motrin, Naproxen,          Naprosyn, Goodies powders or aspirin products. Ok to take Tylenol.   ____ Stop supplements until after surgery.    ____ Bring C-Pap to the hospital.

## 2016-03-14 ENCOUNTER — Encounter: Payer: Self-pay | Admitting: *Deleted

## 2016-03-14 ENCOUNTER — Ambulatory Visit: Payer: Medicare Other | Admitting: Anesthesiology

## 2016-03-14 ENCOUNTER — Ambulatory Visit: Payer: Medicare Other

## 2016-03-14 ENCOUNTER — Ambulatory Visit
Admission: RE | Admit: 2016-03-14 | Discharge: 2016-03-14 | Disposition: A | Discharge status: Home / Self Care | Payer: Medicare Other | Source: Ambulatory Visit | Attending: General Surgery | Admitting: General Surgery

## 2016-03-14 ENCOUNTER — Encounter
Admission: RE | Disposition: A | Discharge status: Home / Self Care | Payer: Self-pay | Source: Ambulatory Visit | Attending: General Surgery

## 2016-03-14 DIAGNOSIS — Z7952 Long term (current) use of systemic steroids: Secondary | ICD-10-CM | POA: Diagnosis not present

## 2016-03-14 DIAGNOSIS — F17210 Nicotine dependence, cigarettes, uncomplicated: Secondary | ICD-10-CM | POA: Insufficient documentation

## 2016-03-14 DIAGNOSIS — Z791 Long term (current) use of non-steroidal anti-inflammatories (NSAID): Secondary | ICD-10-CM | POA: Insufficient documentation

## 2016-03-14 DIAGNOSIS — Z95828 Presence of other vascular implants and grafts: Secondary | ICD-10-CM

## 2016-03-14 DIAGNOSIS — Z79899 Other long term (current) drug therapy: Secondary | ICD-10-CM | POA: Diagnosis not present

## 2016-03-14 DIAGNOSIS — Z452 Encounter for adjustment and management of vascular access device: Secondary | ICD-10-CM | POA: Diagnosis not present

## 2016-03-14 DIAGNOSIS — I509 Heart failure, unspecified: Secondary | ICD-10-CM | POA: Insufficient documentation

## 2016-03-14 DIAGNOSIS — C4371 Malignant melanoma of right lower limb, including hip: Secondary | ICD-10-CM | POA: Diagnosis not present

## 2016-03-14 DIAGNOSIS — R590 Localized enlarged lymph nodes: Secondary | ICD-10-CM | POA: Diagnosis not present

## 2016-03-14 DIAGNOSIS — Z85820 Personal history of malignant melanoma of skin: Secondary | ICD-10-CM | POA: Insufficient documentation

## 2016-03-14 DIAGNOSIS — C799 Secondary malignant neoplasm of unspecified site: Secondary | ICD-10-CM | POA: Diagnosis not present

## 2016-03-14 DIAGNOSIS — C439 Malignant melanoma of skin, unspecified: Secondary | ICD-10-CM | POA: Diagnosis not present

## 2016-03-14 DIAGNOSIS — K219 Gastro-esophageal reflux disease without esophagitis: Secondary | ICD-10-CM | POA: Insufficient documentation

## 2016-03-14 HISTORY — PX: PORTACATH PLACEMENT: SHX2246

## 2016-03-14 SURGERY — INSERTION, TUNNELED CENTRAL VENOUS DEVICE, WITH PORT
Anesthesia: Monitor Anesthesia Care | Site: Chest | Laterality: Left | Wound class: Clean

## 2016-03-14 MED ORDER — LIDOCAINE HCL (PF) 1 % IJ SOLN
INTRAMUSCULAR | Status: AC
  Filled 2016-03-14: qty 30

## 2016-03-14 MED ORDER — FENTANYL CITRATE (PF) 100 MCG/2ML IJ SOLN: 25.0000 ug | INTRAMUSCULAR | Status: DC | PRN

## 2016-03-14 MED ORDER — CEFAZOLIN SODIUM-DEXTROSE 2-4 GM/100ML-% IV SOLN
2.0000 g | INTRAVENOUS | Status: AC
  Administered 2016-03-14 (×2): 2 g via INTRAVENOUS

## 2016-03-14 MED ORDER — SODIUM CHLORIDE 0.9 % IJ SOLN
INTRAMUSCULAR | Status: DC | PRN
  Administered 2016-03-14: 20 mL

## 2016-03-14 MED ORDER — FENTANYL CITRATE (PF) 100 MCG/2ML IJ SOLN
INTRAMUSCULAR | Status: AC
  Filled 2016-03-14: qty 2

## 2016-03-14 MED ORDER — PROPOFOL 500 MG/50ML IV EMUL
INTRAVENOUS | Status: AC
  Filled 2016-03-14: qty 50

## 2016-03-14 MED ORDER — MIDAZOLAM HCL 2 MG/2ML IJ SOLN
INTRAMUSCULAR | Status: AC
  Filled 2016-03-14: qty 2

## 2016-03-14 MED ORDER — OXYCODONE HCL 5 MG PO TABS: 5.0000 mg | ORAL_TABLET | Freq: Once | ORAL | Status: DC | PRN

## 2016-03-14 MED ORDER — CEFAZOLIN SODIUM-DEXTROSE 2-4 GM/100ML-% IV SOLN
INTRAVENOUS | Status: AC
  Filled 2016-03-14: qty 100

## 2016-03-14 MED ORDER — OXYCODONE HCL 5 MG/5ML PO SOLN: 5.0000 mg | Freq: Once | ORAL | Status: DC | PRN

## 2016-03-14 MED ORDER — PROPOFOL 500 MG/50ML IV EMUL
INTRAVENOUS | Status: DC | PRN
  Administered 2016-03-14: 75 ug/kg/min via INTRAVENOUS

## 2016-03-14 MED ORDER — LIDOCAINE HCL (PF) 1 % IJ SOLN
INTRAMUSCULAR | Status: DC | PRN
  Administered 2016-03-14: 10 mL

## 2016-03-14 MED ORDER — FENTANYL CITRATE (PF) 100 MCG/2ML IJ SOLN
INTRAMUSCULAR | Status: DC | PRN
  Administered 2016-03-14 (×2): 50 ug via INTRAVENOUS

## 2016-03-14 MED ORDER — MIDAZOLAM HCL 2 MG/2ML IJ SOLN
INTRAMUSCULAR | Status: DC | PRN
  Administered 2016-03-14: 2 mg via INTRAVENOUS

## 2016-03-14 MED ORDER — MEPERIDINE HCL 25 MG/ML IJ SOLN: 6.2500 mg | INTRAMUSCULAR | Status: DC | PRN

## 2016-03-14 MED ORDER — ONDANSETRON HCL 4 MG/2ML IJ SOLN
INTRAMUSCULAR | Status: DC | PRN
  Administered 2016-03-14: 4 mg via INTRAVENOUS

## 2016-03-14 MED ORDER — SODIUM CHLORIDE 0.9 % IJ SOLN
INTRAMUSCULAR | Status: AC
  Filled 2016-03-14: qty 50

## 2016-03-14 MED ORDER — PROMETHAZINE HCL 25 MG/ML IJ SOLN: 6.2500 mg | INTRAMUSCULAR | Status: DC | PRN

## 2016-03-14 MED ORDER — LACTATED RINGERS IV SOLN
INTRAVENOUS | Status: DC
  Administered 2016-03-14 (×2): via INTRAVENOUS

## 2016-03-14 SURGICAL SUPPLY — 27 items
BLADE SURG 15 STRL SS SAFETY (BLADE) ×2 IMPLANT
CHLORAPREP W/TINT 26ML (MISCELLANEOUS) ×2 IMPLANT
COVER LIGHT HANDLE STERIS (MISCELLANEOUS) ×4 IMPLANT
DECANTER SPIKE VIAL GLASS SM (MISCELLANEOUS) ×4 IMPLANT
DRAPE C-ARM XRAY 36X54 (DRAPES) ×2 IMPLANT
DRAPE LAPAROTOMY TRNSV 106X77 (MISCELLANEOUS) ×2 IMPLANT
DRESSING TELFA 4X3 1S ST N-ADH (GAUZE/BANDAGES/DRESSINGS) ×2 IMPLANT
DRSG TEGADERM 2-3/8X2-3/4 SM (GAUZE/BANDAGES/DRESSINGS) ×2 IMPLANT
DRSG TEGADERM 4X4.75 (GAUZE/BANDAGES/DRESSINGS) ×2 IMPLANT
ELECT REM PT RETURN 9FT ADLT (ELECTROSURGICAL) ×2
ELECTRODE REM PT RTRN 9FT ADLT (ELECTROSURGICAL) ×1 IMPLANT
GLOVE BIO SURGEON STRL SZ7.5 (GLOVE) ×8 IMPLANT
GLOVE INDICATOR 8.0 STRL GRN (GLOVE) ×6 IMPLANT
GOWN STRL REUS W/ TWL LRG LVL3 (GOWN DISPOSABLE) ×3 IMPLANT
GOWN STRL REUS W/TWL LRG LVL3 (GOWN DISPOSABLE) ×3
KIT PORT POWER 8FR ISP CVUE (Catheter) ×2 IMPLANT
KIT RM TURNOVER STRD PROC AR (KITS) ×2 IMPLANT
LABEL OR SOLS (LABEL) IMPLANT
NS IRRIG 500ML POUR BTL (IV SOLUTION) ×2 IMPLANT
PACK PORT-A-CATH (MISCELLANEOUS) ×2 IMPLANT
STRIP CLOSURE SKIN 1/2X4 (GAUZE/BANDAGES/DRESSINGS) ×2 IMPLANT
SUT PROLENE 3 0 SH DA (SUTURE) ×2 IMPLANT
SUT VIC AB 3-0 SH 27 (SUTURE) ×1
SUT VIC AB 3-0 SH 27X BRD (SUTURE) ×1 IMPLANT
SUT VIC AB 4-0 FS2 27 (SUTURE) ×2 IMPLANT
SWABSTK COMLB BENZOIN TINCTURE (MISCELLANEOUS) ×2 IMPLANT
SYR 10ML SLIP (SYRINGE) ×2 IMPLANT

## 2016-03-14 NOTE — H&P (Signed)
No change in clinical history or exam.  For left power port placement.  

## 2016-03-14 NOTE — Anesthesia Preprocedure Evaluation (Signed)
Anesthesia Evaluation  Patient identified by MRN, date of birth, ID band Patient awake    Reviewed: Allergy & Precautions, NPO status , Patient's Chart, lab work & pertinent test results  History of Anesthesia Complications Negative for: history of anesthetic complications  Airway Mallampati: I  TM Distance: >3 FB Neck ROM: Full    Dental no notable dental hx.    Pulmonary neg sleep apnea, neg COPD, Current Smoker,    breath sounds clear to auscultation- rhonchi (-) wheezing      Cardiovascular hypertension, +CHF (preserved EF)   Rhythm:Regular Rate:Normal - Systolic murmurs and - Diastolic murmurs Echo 30/09/23: NORMAL LV SYSTOLIC FUNCTION WITH AN ESTIMATED EF = 45 % NORMAL RIGHT VENTRICULAR SYSTOLIC FUNCTION MODERATE MITRAL VALVE INSUFFICIENCY MILD TRICUSPID VALVE INSUFFICIENCY TRACE AORTIC VALVE INSUFFICIENCY NO VALVULAR STENOSIS MILD BIATRIAL ENLARGEMENT   Neuro/Psych  Headaches, Anxiety    GI/Hepatic Neg liver ROS, GERD  ,  Endo/Other  Hypothyroidism   Renal/GU Renal InsufficiencyRenal disease     Musculoskeletal negative musculoskeletal ROS (+)   Abdominal (+) - obese,   Peds  Hematology negative hematology ROS (+)   Anesthesia Other Findings Past Medical History: 2016: CHF (congestive heart failure) ([place])     Comment: Dr. Nehemiah Massed 2012: GERD (gastroesophageal reflux disease) No date: Hypothyroidism 2010: Lumbar herniated disc 2008, 2017: Malignant melanoma of skin of lower limb, incl*     Comment: right thigh 2012: Personal history of malignant melanoma of skin No date: Tendonitis 07/20/2015: Tick bite of head   Reproductive/Obstetrics                             Anesthesia Physical Anesthesia Plan  ASA: III  Anesthesia Plan: General   Post-op Pain Management:    Induction: Intravenous  Airway Management Planned: Natural Airway  Additional Equipment:    Intra-op Plan:   Post-operative Plan:   Informed Consent: I have reviewed the patients History and Physical, chart, labs and discussed the procedure including the risks, benefits and alternatives for the proposed anesthesia with the patient or authorized representative who has indicated his/her understanding and acceptance.   Dental advisory given  Plan Discussed with: CRNA and Anesthesiologist  Anesthesia Plan Comments:         Anesthesia Quick Evaluation

## 2016-03-14 NOTE — Op Note (Signed)
Preoperative diagnosis: Metastatic melanoma.  Postoperative diagnosis: Same.  Operative procedure: Left subclavian PowerPort placement with ultrasound and fluoroscopic guidance.  Operating surgeon: Mikael Spray, M.D.  Anesthesia: Attended local, 10 mL 1% plain Xylocaine.  Estimated blood loss: Minimal.  Clinical note: This 56 year old was initially diagnosed with melanoma in 2007. She's developed recurrent disease and needed central venous access for ongoing immunotherapy.  Operative note: With the patient under adequate sedation the area of the left chest and neck was prepped with ChloraPrep and draped. Ultrasound was used to confirm patency of the subclavian vein. This was then cannulated under direct vision after local anesthetic was infiltrated. The guidewire was passed followed by the dilator. Under fluoroscopy the catheter was positioned in the junction of the right atrium and SVC. This was tunneled to a pocket in the left anterior chest with a port was anchored to the deep fascia with interrupted 3-0 Prolene sutures. The subcutaneous tissue was closed with a running 3-0 Vicryl suture and the skin closed with a running 4-0 Vicryl subcuticular suture. Benzoin, Steri-Strips, Telfa and Tegaderm dressings were applied. The catheter easily irrigated and aspirated with the patient in the supine position.  Correct chest x-ray obtained in the recovery room showed good positioning as described above and no evidence of pneumothorax.  The patient tolerated the procedure well was taken to recovery in stable condition.

## 2016-03-14 NOTE — Progress Notes (Deleted)
[name]  [phone] [phone]  ID: Patricia Li OB: 1960-06-16  MR#: [id number]  JHE#:[id number]  Patient Care Team: Donnie Mesa, MD as PCP - General (Family Medicine) Robert Bellow, MD as Consulting Physician (General Surgery) Forest Gleason, MD (Oncology) Lloyd Huger, MD as Consulting Physician (Oncology)  CHIEF COMPLAINT: Recurrent metastatic melanoma, BRAF+, of right lower extremity, metastatic.  INTERVAL HISTORY: Patient returns to clinic today for further evaluation and treatment of cycle 3 of 4 nivolumab and ipilumimab today. She is having painful and swollen joints on her hands and feet. She does not complain of tinnitus today. She has no other neurological complaints. She denies any recent fevers or illnesses. She has a good appetite and denies weight loss. She denies any chest pain or shortness of breath. She has no nausea, vomiting, or constipation. She has no urinary complaints. Patient otherwise feels well and offers no further specific complaints.   REVIEW OF SYSTEMS:   Review of Systems  Constitutional: Negative.  Negative for fever, malaise/fatigue and weight loss.  HENT: Negative.  Negative for ear pain and tinnitus (Began prior to immunotherapy).   Eyes: Negative.   Respiratory: Negative for cough and shortness of breath.   Cardiovascular: Positive for leg swelling. Negative for chest pain.  Gastrointestinal: Negative for abdominal pain, blood in stool and diarrhea.  Genitourinary: Negative.   Musculoskeletal: Positive for joint pain.       Right inguinal pain.  Skin: Negative for itching and rash.  Neurological: Negative.  Negative for sensory change and weakness.  Endo/Heme/Allergies: Does not bruise/bleed easily.  Psychiatric/Behavioral: The patient is nervous/anxious and has insomnia.     As per HPI. Otherwise, a complete review of systems is negative.  PAST MEDICAL HISTORY: Past Medical History:    Diagnosis Date  . CHF (congestive heart failure) ([place]) 2016   Dr. Nehemiah Massed  . GERD (gastroesophageal reflux disease) 2012  . Hypothyroidism   . Lumbar herniated disc 2010  . Malignant melanoma of skin of lower limb, including hip ([place]) 2008, 2017   right thigh  . Personal history of malignant melanoma of skin 2012  . Tendonitis   . Tick bite of head 07/20/2015    PAST SURGICAL HISTORY: Past Surgical History:  Procedure Laterality Date  . BACK SURGERY  2010  . BREAST CYST EXCISION  1985  . COLONOSCOPY  07/2012   [facility]  . FRACTURE SURGERY     rod removed from left femur 07/21/2014  . INGUINAL LYMPH NODE BIOPSY Right 07/17/2015   Procedure: INGUINAL LYMPH NODE BIOPSY/EXCISION;  Surgeon: Robert Bellow, MD;  Location: [facility] ORS;  Service: General;  Laterality: Right;  . MELANOMA EXCISION  2008   right thigh  . PORTACATH PLACEMENT Left 03/14/2016   Procedure: INSERTION PORT-A-CATH;  Surgeon: Robert Bellow, MD;  Location: [facility] ORS;  Service: General;  Laterality: Left;  . SKIN LESION EXCISION Right 12-19-10   posterior    FAMILY HISTORY: Family History  Problem Relation Age of Onset  . COPD Father   . Cancer Sister     Colon, sister       ADVANCED DIRECTIVES:    HEALTH MAINTENANCE: Social History  Substance Use Topics  . Smoking status: Current Every Day Smoker    Packs/day: 0.50    Years: 20.00    Types: Cigarettes  . Smokeless tobacco: Never Used  . Alcohol use 0.0 oz/week     Comment: ocassionally     Colonoscopy:  PAP:  Bone density:  Lipid panel:  Allergies  Allergen Reactions  . Clindamycin Shortness Of Breath  . Levaquin [Levofloxacin] Other (See Comments)    Joint inflammation  . Aripiprazole Other (See Comments)    Facial and head ticks  . Valproic Acid     Other reaction(s): Other (See Comments) Facial twitching    Current Outpatient Prescriptions  Medication Sig Dispense Refill  . aspirin-acetaminophen-caffeine (EXCEDRIN MIGRAINE)  250-250-65 MG tablet Take 2 tablets by mouth daily as needed for headache.    . calcium carbonate (OS-CAL) 600 MG TABS tablet Take 600 mg by mouth 2 (two) times daily with a meal.    . clonazePAM (KLONOPIN) 0.5 MG tablet Take 0.5 mg by mouth at bedtime.    Marland Kitchen dexamethasone (DECADRON) 4 MG tablet Take 1 tablet (4 mg total) by mouth daily. 30 tablet 1  . furosemide (LASIX) 20 MG tablet Take 20 mg by mouth every other day.     Marland Kitchen HYDROcodone-acetaminophen (NORCO/VICODIN) 5-325 MG tablet Take 1 tablet by mouth every 4 (four) hours as needed for moderate pain. 60 tablet 0  . hydrocortisone (CORTEF) 10 MG tablet Take 10-15 mg by mouth 2 (two) times daily. 1.5 tablets (15 MG) in the morning and 1 tablet (10 MG) in the evening    . levothyroxine (SYNTHROID, LEVOTHROID) 112 MCG tablet Take 112 mcg by mouth daily before breakfast.     . meloxicam (MOBIC) 7.5 MG tablet Take 1 tablet (7.5 mg total) by mouth daily. (Patient taking differently: Take 7.5 mg by mouth daily as needed (for pain/inflammation). ) 30 tablet 0  . metoprolol tartrate (LOPRESSOR) 25 MG tablet Take 12.5 mg by mouth 2 (two) times daily.     . Multiple Vitamin (MULTIVITAMIN WITH MINERALS) TABS tablet Take 1 tablet by mouth daily.    . ondansetron (ZOFRAN) 4 MG tablet Take 1 tablet (4 mg total) by mouth every 8 (eight) hours as needed for nausea or vomiting. 30 tablet 2  . pantoprazole (PROTONIX) 40 MG tablet Take 40 mg by mouth 2 (two) times daily.     . predniSONE (DELTASONE) 10 MG tablet Take as directed in ipilimumab Curt Bears) instructions. (Patient not taking: Reported on 03/11/2016) 120 tablet 0  . predniSONE (DELTASONE) 10 MG tablet Take 1 tablet (10 mg total) by mouth daily with breakfast. (Patient not taking: Reported on 03/10/2016) 30 tablet 0  . venlafaxine XR (EFFEXOR-XR) 150 MG 24 hr capsule 150 mg every morning.     . venlafaxine XR (EFFEXOR-XR) 75 MG 24 hr capsule Take 75 mg by mouth every evening.     No current  facility-administered medications for this visit.     OBJECTIVE: There were no vitals filed for this visit.   There is no height or weight on file to calculate BMI.    ECOG FS:0 - Asymptomatic  General: Well-developed, well-nourished, no acute distress. Eyes: Pink conjunctiva, anicteric sclera. HEENT: Normocephalic, moist mucous membranes, clear oropharnyx. Lungs: Clear to auscultation bilaterally. Heart: Regular rate and rhythm. No rubs, murmurs, or gallops. Abdomen: Soft, nontender, nondistended. No organomegaly noted, normoactive bowel sounds. Musculoskeletal: No edema, cyanosis, or clubbing. No obvious swelling of joints, no decrease in strength. Moderate tenderness to touch. Neuro: Alert, answering all questions appropriately. Cranial nerves grossly intact. Skin: Rash noted on face, ears, and torso consistent with Zelboraf Psych: Normal affect.   LAB RESULTS:  Lab Results  Component Value Date   NA 134 (L) 02/25/2016   K 4.1 02/25/2016   CL 96 (L)  02/25/2016   CO2 32 02/25/2016   GLUCOSE 81 02/25/2016   BUN 28 (H) 02/25/2016   CREATININE 2.05 (H) 02/25/2016   CALCIUM 9.4 02/25/2016   PROT 6.2 (L) 02/25/2016   ALBUMIN 3.4 (L) 02/25/2016   AST 29 02/25/2016   ALT 17 02/25/2016   ALKPHOS 61 02/25/2016   BILITOT 0.5 02/25/2016   GFRNONAA 26 (L) 02/25/2016   GFRAA 31 (L) 02/25/2016    Lab Results  Component Value Date   WBC 9.5 02/25/2016   NEUTROABS 7.0 (H) 02/25/2016   HGB 12.5 02/25/2016   HCT 36.8 02/25/2016   MCV 91.5 02/25/2016   PLT 332 02/25/2016    STUDIES: Dg Chest Port 1 View  Result Date: 03/14/2016 CLINICAL DATA:  Status post Port-A-Cath insertion EXAM: PORTABLE CHEST 1 VIEW COMPARISON:  CT chest 02/20/2016 FINDINGS: There is a left-sided Port-A-Cath with the tip projecting over the cavoatrial junction. There is no focal parenchymal opacity. There is no pleural effusion or pneumothorax. The heart and mediastinal contours are unremarkable. The osseous  structures are unremarkable. IMPRESSION: No active disease. Electronically Signed   By: Kathreen Devoid   On: 03/14/2016 10:59   Dg C-arm 1-60 Min-no Report  Result Date: 03/14/2016 There is no Radiologist interpretation  for this exam.   ONCOLOGIC HISTORY: Patient initially diagnosed with metastatic melanoma to right thigh with unknown primary in 2009. She received 4 weeks of high-dose interferon therapy in February 2009 and proceed with low-dose interferon therapy in March 2009. Patient remained in remission until August 2012 when she was noted to have recurrent melanoma and a pelvic lymph node. October 2012 she had 2 cycles of Ipilumimab which was discontinued secondary to poor tolerance. In April 2017 PET scan showed recurrent disease in the right inguinal region which was confirmed by biopsy. Zeboraf discontinued in November 2017 secondary to intolerable rash. Patient initiated combination immunotherapy with nivolumab and ipilumimab on January 14, 2016.  ASSESSMENT: Recurrent metastatic melanoma, BRAF+, of right lower extremity, metastatic.  PLAN:    1. Recurrent metastatic melanoma, BRAF+, of right lower extremity, metastatic:  PET scan results After a brief treatment with Zelboraf reviewed Independently with improvement of disease burden. Despite this, Zelboraf was been discontinued after persistent rash despite multiple dose reductions. After lengthy discussion with the patient to attempt another oral medication or oral combination such as dabrafenib/trametinib or cobimetinib, she has elected to pursue combination immunotherapy with nivolumab and ipilumimab. She will receive 1 mg/kg nivolumab + 3 mg/kg ipilumimab every 3 weeks for 4 doses at which point will consider maintenance treatment with 240 mg flat dose nivolumab every 2 weeks until disease progression or unacceptable toxicity. Previously patient had poor tolerance to ipilumimab but is tolerating well this time. Proceed with cycle 3 of 4  nivolumab and ipilumimab today. Return to clinic in 3 weeks for consideration of cycle 4. Patient has requested port placement. Repeat PET scan after the conclusion of cycle 4. 2. Pain: Continue Vicodin as needed. Meloxicam was prescribed for body aches. She joint pain may be inflammatory secondary to immunotherapy, therefore she was given a prescription for prednisone 10 mg daily for 7 days. She has been instructed to call clinic at the end of 7 days to determine whether to discontinue treatment or increased dosing.  3. Nausea: Continue Compazine as needed. 4. Diarrhea: Continue Imodium OTC as needed  Approximately 30 minutes was spent in discussion of which greater than 50% was consultation.  Patient expressed understanding and was in agreement with this  plan. She also understands that She can call clinic at any time with any questions, concerns, or complaints.    Lloyd Huger, MD 03/14/16 10:19 PM

## 2016-03-14 NOTE — Discharge Instructions (Signed)

## 2016-03-14 NOTE — Anesthesia Postprocedure Evaluation (Signed)
Anesthesia Post Note  Patient: Patricia Li  Procedure(s) Performed: Procedure(s) (LRB): INSERTION PORT-A-CATH (Left)  Patient location during evaluation: PACU Anesthesia Type: MAC Level of consciousness: awake and alert Pain management: pain level controlled Vital Signs Assessment: post-procedure vital signs reviewed and stable Respiratory status: spontaneous breathing, nonlabored ventilation, respiratory function stable and patient connected to nasal cannula oxygen Cardiovascular status: blood pressure returned to baseline and stable Postop Assessment: no signs of nausea or vomiting Anesthetic complications: no     Last Vitals:  Vitals:   03/14/16 1137 03/14/16 1156  BP: 136/87 127/79  Pulse: 66 72  Resp: 16 16  Temp: 36.6 C 36.8 C    Last Pain:  Vitals:   03/14/16 1156  TempSrc: Temporal  PainSc:                  Molli Barrows

## 2016-03-14 NOTE — Transfer of Care (Signed)
Immediate Anesthesia Transfer of Care Note  Patient: Patricia Li  Procedure(s) Performed: Procedure(s): INSERTION PORT-A-CATH (Left)  Patient Location: PACU  Anesthesia Type:MAC  Level of Consciousness: awake  Airway & Oxygen Therapy: Patient Spontanous Breathing and Patient connected to face mask oxygen  Post-op Assessment: Report given to RN  Post vital signs: Reviewed and stable   Last Vitals:  Vitals:   03/14/16 0821  BP: 126/83  Pulse: 64  Resp: 16  Temp: 36.4 C    Last Pain:  Vitals:   03/14/16 0821  TempSrc: Tympanic         Complications: No apparent anesthesia complications

## 2016-03-16 ENCOUNTER — Other Ambulatory Visit: Payer: Self-pay | Admitting: *Deleted

## 2016-03-16 DIAGNOSIS — C799 Secondary malignant neoplasm of unspecified site: Secondary | ICD-10-CM

## 2016-03-16 DIAGNOSIS — C439 Malignant melanoma of skin, unspecified: Secondary | ICD-10-CM

## 2016-03-16 MED ORDER — MELOXICAM 7.5 MG PO TABS
7.5000 mg | ORAL_TABLET | Freq: Every day | ORAL | 0 refills | Status: DC
Start: 2016-03-16 — End: 2016-03-24

## 2016-03-17 ENCOUNTER — Inpatient Hospital Stay: Payer: Medicare Other | Admitting: Oncology

## 2016-03-17 ENCOUNTER — Inpatient Hospital Stay: Payer: Medicare Other

## 2016-03-23 NOTE — Progress Notes (Signed)
[place]  Telephone:([phone] Fax:([phone]  ID: Patricia Li OB: 1960/05/25  MR#: [id number]  RXV#:[id number]  Patient Care Team: Donnie Mesa, MD as PCP - General (Family Medicine) Robert Bellow, MD as Consulting Physician (General Surgery) Forest Gleason, MD (Oncology) Lloyd Huger, MD as Consulting Physician (Oncology)  CHIEF COMPLAINT: Recurrent metastatic melanoma, BRAF+, of right lower extremity, metastatic.  INTERVAL HISTORY: Patient returns to clinic today for further evaluation and treatment of cycle 4 of 4 nivolumab and ipilumimab today. Her painful and swollen joints on her hands and feet have improved on steroids, but she is having weight gain. She does not complain of tinnitus today. She has no other neurological complaints. She denies any recent fevers or illnesses. She has a good appetite. She denies any chest pain or shortness of breath. She has no nausea, vomiting, or constipation. She has no urinary complaints. Patient otherwise feels well and offers no further specific complaints.   REVIEW OF SYSTEMS:   Review of Systems  Constitutional: Negative.  Negative for fever, malaise/fatigue and weight loss.  HENT: Negative.  Negative for ear pain and tinnitus (Began prior to immunotherapy).   Eyes: Negative.   Respiratory: Negative for cough and shortness of breath.   Cardiovascular: Positive for leg swelling. Negative for chest pain.  Gastrointestinal: Negative for abdominal pain, blood in stool and diarrhea.  Genitourinary: Negative.   Musculoskeletal: Negative.  Negative for joint pain.       Right inguinal pain.  Skin: Negative for itching and rash.  Neurological: Negative.  Negative for sensory change and weakness.  Endo/Heme/Allergies: Does not bruise/bleed easily.  Psychiatric/Behavioral: The patient has insomnia. The patient is not nervous/anxious.     As per HPI. Otherwise, a complete review of systems is  negative.  PAST MEDICAL HISTORY: Past Medical History:  Diagnosis Date  . CHF (congestive heart failure) ([place]) 2016   Dr. Nehemiah Massed  . GERD (gastroesophageal reflux disease) 2012  . Hypothyroidism   . Lumbar herniated disc 2010  . Malignant melanoma of skin of lower limb, including hip ([place]) 2008, 2017   right thigh  . Personal history of malignant melanoma of skin 2012  . Tendonitis   . Tick bite of head 07/20/2015    PAST SURGICAL HISTORY: Past Surgical History:  Procedure Laterality Date  . BACK SURGERY  2010  . BREAST CYST EXCISION  1985  . COLONOSCOPY  07/2012   [facility]  . FRACTURE SURGERY     rod removed from left femur 07/21/2014  . INGUINAL LYMPH NODE BIOPSY Right 07/17/2015   Procedure: INGUINAL LYMPH NODE BIOPSY/EXCISION;  Surgeon: Robert Bellow, MD;  Location: [facility] ORS;  Service: General;  Laterality: Right;  . MELANOMA EXCISION  2008   right thigh  . PORTACATH PLACEMENT Left 03/14/2016   Procedure: INSERTION PORT-A-CATH;  Surgeon: Robert Bellow, MD;  Location: [facility] ORS;  Service: General;  Laterality: Left;  . SKIN LESION EXCISION Right 12-19-10   posterior    FAMILY HISTORY: Family History  Problem Relation Age of Onset  . COPD Father   . Cancer Sister     Colon, sister       ADVANCED DIRECTIVES:    HEALTH MAINTENANCE: Social History  Substance Use Topics  . Smoking status: Current Every Day Smoker    Packs/day: 0.50    Years: 20.00    Types: Cigarettes  . Smokeless tobacco: Never Used  . Alcohol use 0.0 oz/week     Comment: ocassionally  Colonoscopy:  PAP:  Bone density:  Lipid panel:  Allergies  Allergen Reactions  . Clindamycin Shortness Of Breath  . Levaquin [Levofloxacin] Other (See Comments)    Joint inflammation  . Aripiprazole Other (See Comments)    Facial and head ticks  . Valproic Acid     Other reaction(s): Other (See Comments) Facial twitching    Current Outpatient Prescriptions  Medication Sig Dispense Refill   . aspirin-acetaminophen-caffeine (EXCEDRIN MIGRAINE) 250-250-65 MG tablet Take 2 tablets by mouth daily as needed for headache.    . calcium carbonate (OS-CAL) 600 MG TABS tablet Take 600 mg by mouth 2 (two) times daily with a meal.    . clonazePAM (KLONOPIN) 0.5 MG tablet Take 0.5 mg by mouth at bedtime.    Marland Kitchen dexamethasone (DECADRON) 4 MG tablet Take 1 tablet (4 mg total) by mouth daily. 30 tablet 1  . furosemide (LASIX) 20 MG tablet Take 20 mg by mouth every other day.     Marland Kitchen HYDROcodone-acetaminophen (NORCO/VICODIN) 5-325 MG tablet Take 1 tablet by mouth every 4 (four) hours as needed for moderate pain. 60 tablet 0  . hydrocortisone (CORTEF) 10 MG tablet Take 10-15 mg by mouth 2 (two) times daily. 1.5 tablets (15 MG) in the morning and 1 tablet (10 MG) in the evening    . levothyroxine (SYNTHROID, LEVOTHROID) 112 MCG tablet Take 112 mcg by mouth daily before breakfast.     . metoprolol tartrate (LOPRESSOR) 25 MG tablet Take 12.5 mg by mouth 2 (two) times daily.     . Multiple Vitamin (MULTIVITAMIN WITH MINERALS) TABS tablet Take 1 tablet by mouth daily.    . ondansetron (ZOFRAN) 4 MG tablet Take 1 tablet (4 mg total) by mouth every 8 (eight) hours as needed for nausea or vomiting. 30 tablet 2  . pantoprazole (PROTONIX) 40 MG tablet Take 40 mg by mouth 2 (two) times daily.     Marland Kitchen venlafaxine XR (EFFEXOR-XR) 150 MG 24 hr capsule 150 mg every morning.     . venlafaxine XR (EFFEXOR-XR) 75 MG 24 hr capsule Take 75 mg by mouth every evening.     No current facility-administered medications for this visit.     OBJECTIVE: Vitals:   03/24/16 0926  BP: (!) 140/93  Pulse: (!) 57  Resp: 18  Temp: 98.8 F (37.1 C)     Body mass index is 26.14 kg/m.    ECOG FS:0 - Asymptomatic  General: Well-developed, well-nourished, no acute distress. Eyes: Pink conjunctiva, anicteric sclera. HEENT: Normocephalic, moist mucous membranes, clear oropharnyx. Lungs: Clear to auscultation bilaterally. Heart:  Regular rate and rhythm. No rubs, murmurs, or gallops. Abdomen: Soft, nontender, nondistended. No organomegaly noted, normoactive bowel sounds. Musculoskeletal: No edema, cyanosis, or clubbing. No obvious swelling of joints, no decrease in strength. Moderate tenderness to touch. Neuro: Alert, answering all questions appropriately. Cranial nerves grossly intact. Skin: Rash noted on face, ears, and torso consistent with Zelboraf Psych: Normal affect.   LAB RESULTS:  Lab Results  Component Value Date   NA 135 03/24/2016   K 3.8 03/24/2016   CL 102 03/24/2016   CO2 26 03/24/2016   GLUCOSE 91 03/24/2016   BUN 31 (H) 03/24/2016   CREATININE 1.41 (H) 03/24/2016   CALCIUM 8.4 (L) 03/24/2016   PROT 6.7 03/24/2016   ALBUMIN 4.0 03/24/2016   AST 23 03/24/2016   ALT 15 03/24/2016   ALKPHOS 42 03/24/2016   BILITOT 0.6 03/24/2016   GFRNONAA 41 (L) 03/24/2016   GFRAA 48 (L)  03/24/2016    Lab Results  Component Value Date   WBC 10.5 03/24/2016   NEUTROABS 8.5 (H) 03/24/2016   HGB 13.0 03/24/2016   HCT 37.9 03/24/2016   MCV 91.4 03/24/2016   PLT 210 03/24/2016    STUDIES: Dg Chest Port 1 View  Result Date: 03/14/2016 CLINICAL DATA:  Status post Port-A-Cath insertion EXAM: PORTABLE CHEST 1 VIEW COMPARISON:  CT chest 02/20/2016 FINDINGS: There is a left-sided Port-A-Cath with the tip projecting over the cavoatrial junction. There is no focal parenchymal opacity. There is no pleural effusion or pneumothorax. The heart and mediastinal contours are unremarkable. The osseous structures are unremarkable. IMPRESSION: No active disease. Electronically Signed   By: Kathreen Devoid   On: 03/14/2016 10:59   Dg C-arm 1-60 Min-no Report  Result Date: 03/14/2016 There is no Radiologist interpretation  for this exam.   ONCOLOGIC HISTORY: Patient initially diagnosed with metastatic melanoma to right thigh with unknown primary in 2009. She received 4 weeks of high-dose interferon therapy in February 2009  and proceed with low-dose interferon therapy in March 2009. Patient remained in remission until August 2012 when she was noted to have recurrent melanoma and a pelvic lymph node. October 2012 she had 2 cycles of Ipilumimab which was discontinued secondary to poor tolerance. In April 2017 PET scan showed recurrent disease in the right inguinal region which was confirmed by biopsy. Zeboraf discontinued in November 2017 secondary to intolerable rash. Patient initiated combination immunotherapy with nivolumab and ipilumimab on January 14, 2016.  ASSESSMENT: Recurrent metastatic melanoma, BRAF+, of right lower extremity, metastatic.  PLAN:    1. Recurrent metastatic melanoma, BRAF+, of right lower extremity, metastatic:  PET scan results reviewed Independently with improvement of disease burden. Despite this, Zelboraf was discontinued after persistent rash despite multiple dose reductions. After lengthy discussion with the patient to attempt another oral medication or oral combination such as dabrafenib/trametinib or cobimetinib, she has elected to pursue combination immunotherapy with nivolumab and ipilumimab. She will receive 1 mg/kg nivolumab + 3 mg/kg ipilumimab every 3 weeks for 4 doses at which point will consider maintenance treatment with 240 mg flat dose nivolumab every 2 weeks until disease progression or unacceptable toxicity. Previously patient had poor tolerance to ipilumimab but is tolerating well this time. Proceed with cycle 4 of 4 nivolumab and ipilumimab today. Return to clinic in 3 weeks for consideration cycle 1 of maintenance nivolumab.  Will re-image with PET scan prior to next clinic visit.  2. Pain: Continue Vicodin as needed. Meloxicam was prescribed for body aches. Improved with steroids, but will decrease dose to 4 mg dexamethazone every other day   3. Nausea: Continue Compazine as needed. 4. Diarrhea: Continue Imodium OTC as needed  Approximately 30 minutes was spent in discussion  of which greater than 50% was consultation.  Patient expressed understanding and was in agreement with this plan. She also understands that She can call clinic at any time with any questions, concerns, or complaints.    Lloyd Huger, MD 03/24/16 9:33 AM

## 2016-03-24 ENCOUNTER — Inpatient Hospital Stay (HOSPITAL_BASED_OUTPATIENT_CLINIC_OR_DEPARTMENT_OTHER): Payer: Medicare Other | Admitting: Oncology

## 2016-03-24 ENCOUNTER — Inpatient Hospital Stay: Payer: Medicare Other | Attending: Oncology

## 2016-03-24 ENCOUNTER — Inpatient Hospital Stay: Payer: Medicare Other

## 2016-03-24 VITALS — BP 140/93 | HR 57 | Temp 98.8°F | Resp 18 | Wt 166.9 lb

## 2016-03-24 DIAGNOSIS — C439 Malignant melanoma of skin, unspecified: Secondary | ICD-10-CM

## 2016-03-24 DIAGNOSIS — R197 Diarrhea, unspecified: Secondary | ICD-10-CM

## 2016-03-24 DIAGNOSIS — Z5112 Encounter for antineoplastic immunotherapy: Secondary | ICD-10-CM | POA: Diagnosis not present

## 2016-03-24 DIAGNOSIS — Z79899 Other long term (current) drug therapy: Secondary | ICD-10-CM | POA: Insufficient documentation

## 2016-03-24 DIAGNOSIS — F439 Reaction to severe stress, unspecified: Secondary | ICD-10-CM

## 2016-03-24 DIAGNOSIS — G893 Neoplasm related pain (acute) (chronic): Secondary | ICD-10-CM | POA: Insufficient documentation

## 2016-03-24 DIAGNOSIS — R110 Nausea: Secondary | ICD-10-CM | POA: Insufficient documentation

## 2016-03-24 DIAGNOSIS — E039 Hypothyroidism, unspecified: Secondary | ICD-10-CM

## 2016-03-24 DIAGNOSIS — C775 Secondary and unspecified malignant neoplasm of intrapelvic lymph nodes: Secondary | ICD-10-CM | POA: Insufficient documentation

## 2016-03-24 DIAGNOSIS — C792 Secondary malignant neoplasm of skin: Secondary | ICD-10-CM | POA: Diagnosis not present

## 2016-03-24 DIAGNOSIS — K219 Gastro-esophageal reflux disease without esophagitis: Secondary | ICD-10-CM | POA: Insufficient documentation

## 2016-03-24 DIAGNOSIS — F17210 Nicotine dependence, cigarettes, uncomplicated: Secondary | ICD-10-CM | POA: Insufficient documentation

## 2016-03-24 DIAGNOSIS — C799 Secondary malignant neoplasm of unspecified site: Secondary | ICD-10-CM

## 2016-03-24 DIAGNOSIS — C4371 Malignant melanoma of right lower limb, including hip: Secondary | ICD-10-CM | POA: Insufficient documentation

## 2016-03-24 DIAGNOSIS — I509 Heart failure, unspecified: Secondary | ICD-10-CM | POA: Insufficient documentation

## 2016-03-24 LAB — COMPREHENSIVE METABOLIC PANEL
ALBUMIN: 4 g/dL (ref 3.5–5.0)
ALT: 15 U/L (ref 14–54)
ANION GAP: 7 (ref 5–15)
AST: 23 U/L (ref 15–41)
Alkaline Phosphatase: 42 U/L (ref 38–126)
BILIRUBIN TOTAL: 0.6 mg/dL (ref 0.3–1.2)
BUN: 31 mg/dL — ABNORMAL HIGH (ref 6–20)
CHLORIDE: 102 mmol/L (ref 101–111)
CO2: 26 mmol/L (ref 22–32)
Calcium: 8.4 mg/dL — ABNORMAL LOW (ref 8.9–10.3)
Creatinine, Ser: 1.41 mg/dL — ABNORMAL HIGH (ref 0.44–1.00)
GFR calc Af Amer: 48 mL/min — ABNORMAL LOW (ref >60)
GFR calc non Af Amer: 41 mL/min — ABNORMAL LOW (ref >60)
GLUCOSE: 91 mg/dL (ref 65–99)
POTASSIUM: 3.8 mmol/L (ref 3.5–5.1)
Sodium: 135 mmol/L (ref 135–145)
TOTAL PROTEIN: 6.7 g/dL (ref 6.5–8.1)

## 2016-03-24 LAB — CBC WITH DIFFERENTIAL/PLATELET
BASOS PCT: 1 %
Basophils Absolute: 0.1 10*3/uL (ref 0–0.1)
EOS PCT: 1 %
Eosinophils Absolute: 0.1 10*3/uL (ref 0–0.7)
HEMATOCRIT: 37.9 % (ref 35.0–47.0)
Hemoglobin: 13 g/dL (ref 12.0–16.0)
Lymphocytes Relative: 12 %
Lymphs Abs: 1.2 10*3/uL (ref 1.0–3.6)
MCH: 31.4 pg (ref 26.0–34.0)
MCHC: 34.4 g/dL (ref 32.0–36.0)
MCV: 91.4 fL (ref 80.0–100.0)
MONO ABS: 0.7 10*3/uL (ref 0.2–0.9)
MONOS PCT: 6 %
Neutro Abs: 8.5 10*3/uL — ABNORMAL HIGH (ref 1.4–6.5)
Neutrophils Relative %: 80 %
PLATELETS: 210 10*3/uL (ref 150–440)
RBC: 4.15 MIL/uL (ref 3.80–5.20)
RDW: 14 % (ref 11.5–14.5)
WBC: 10.5 10*3/uL (ref 3.6–11.0)

## 2016-03-24 LAB — TSH: TSH: 0.144 u[IU]/mL — AB (ref 0.350–4.500)

## 2016-03-24 MED ORDER — PREDNISONE 10 MG PO TABS: ORAL_TABLET | ORAL | 0 refills | Status: DC

## 2016-03-24 MED ORDER — SODIUM CHLORIDE 0.9 % IV SOLN
Freq: Once | INTRAVENOUS | Status: AC
Start: 2016-03-24 — End: 2016-03-24
  Administered 2016-03-24: 10:00:00 via INTRAVENOUS
  Filled 2016-03-24: qty 1000

## 2016-03-24 MED ORDER — HEPARIN SOD (PORK) LOCK FLUSH 100 UNIT/ML IV SOLN
500.0000 [IU] | Freq: Once | INTRAVENOUS | Status: AC | PRN
  Administered 2016-03-24: 500 [IU]
  Filled 2016-03-24: qty 5

## 2016-03-24 MED ORDER — LIDOCAINE-PRILOCAINE 2.5-2.5 % EX CREA: 1.0000 "application " | TOPICAL_CREAM | CUTANEOUS | 3 refills | Status: DC | PRN

## 2016-03-24 MED ORDER — SODIUM CHLORIDE 0.9 % IV SOLN
1.0000 mg/kg | Freq: Once | INTRAVENOUS | Status: AC
  Administered 2016-03-24: 70 mg via INTRAVENOUS
  Filled 2016-03-24: qty 7

## 2016-03-24 MED ORDER — POTASSIUM CHLORIDE CRYS ER 20 MEQ PO TBCR: 20.0000 meq | EXTENDED_RELEASE_TABLET | Freq: Every day | ORAL | 1 refills | Status: DC

## 2016-03-24 MED ORDER — SODIUM CHLORIDE 0.9 % IV SOLN
3.0000 mg/kg | Freq: Once | INTRAVENOUS | Status: AC
  Administered 2016-03-24: 225 mg via INTRAVENOUS
  Filled 2016-03-24: qty 40

## 2016-03-24 NOTE — Progress Notes (Signed)
States that joint pain is improved after started decadron daily. Pt requests if could take decadron every other day due to side effects of medication.

## 2016-03-25 LAB — T3, FREE: T3 FREE: 1.8 pg/mL — AB (ref 2.0–4.4)

## 2016-03-25 LAB — T4: T4 TOTAL: 7.3 ug/dL (ref 4.5–12.0)

## 2016-04-07 ENCOUNTER — Telehealth: Payer: Self-pay

## 2016-04-07 NOTE — Telephone Encounter (Signed)
Nutrition  Received message from [name] to contact patient regarding colitis diet information.   Spoke with patient via phone and emailed low fiber diet education material.   Patient to contact me with further questions or concerns.  Patricia Li B. Zenia Resides, [place], [place] (pager)

## 2016-04-11 ENCOUNTER — Ambulatory Visit: Payer: Medicare Other

## 2016-04-11 ENCOUNTER — Ambulatory Visit
Admission: RE | Admit: 2016-04-11 | Discharge: 2016-04-11 | Disposition: A | Discharge status: Home / Self Care | Payer: Medicare Other | Source: Ambulatory Visit | Attending: Oncology | Admitting: Oncology

## 2016-04-11 DIAGNOSIS — Z79899 Other long term (current) drug therapy: Secondary | ICD-10-CM | POA: Diagnosis not present

## 2016-04-11 DIAGNOSIS — R591 Generalized enlarged lymph nodes: Secondary | ICD-10-CM | POA: Diagnosis not present

## 2016-04-11 DIAGNOSIS — C799 Secondary malignant neoplasm of unspecified site: Secondary | ICD-10-CM

## 2016-04-11 DIAGNOSIS — C439 Malignant melanoma of skin, unspecified: Secondary | ICD-10-CM | POA: Diagnosis not present

## 2016-04-11 LAB — GLUCOSE, CAPILLARY: Glucose-Capillary: 95 mg/dL (ref 65–99)

## 2016-04-11 MED ORDER — FLUDEOXYGLUCOSE F - 18 (FDG) INJECTION
12.1800 | Freq: Once | INTRAVENOUS | Status: AC | PRN
  Administered 2016-04-11: 12.18 via INTRAVENOUS

## 2016-04-13 ENCOUNTER — Other Ambulatory Visit: Payer: Self-pay | Admitting: *Deleted

## 2016-04-13 ENCOUNTER — Other Ambulatory Visit: Payer: Self-pay | Admitting: Oncology

## 2016-04-13 DIAGNOSIS — C799 Secondary malignant neoplasm of unspecified site: Secondary | ICD-10-CM

## 2016-04-13 DIAGNOSIS — C439 Malignant melanoma of skin, unspecified: Secondary | ICD-10-CM

## 2016-04-13 MED ORDER — MELOXICAM 7.5 MG PO TABS: 7.5000 mg | ORAL_TABLET | Freq: Every day | ORAL | 0 refills | Status: DC

## 2016-04-13 NOTE — Progress Notes (Signed)
Patricia Li  [phone] [phone]  ID: [name] OB: December 23, 1960  MR#: [id number]  KHT#:[id number]  Patient Care Team: Donnie Mesa, MD as PCP - General (Family Medicine) Robert Bellow, MD as Consulting Physician (General Surgery) Forest Gleason, MD (Oncology) Lloyd Huger, MD as Consulting Physician (Oncology)  CHIEF COMPLAINT: Recurrent metastatic melanoma, BRAF+, of right lower extremity, metastatic.  INTERVAL HISTORY: Patient returns to clinic today for further evaluation, discussion of her PET scan results, and continuation of cycle 1 of maintenance nivolumab. Her painful and swollen joints on her hands and feet have improved on steroids. She does not complain of tinnitus today. She has no other neurological complaints. She denies any recent fevers or illnesses. She has a good appetite. She denies any chest pain or shortness of breath. She has no nausea, vomiting, or constipation. She has no urinary complaints. Patient otherwise feels well and offers no further specific complaints.   REVIEW OF SYSTEMS:   Review of Systems  Constitutional: Negative.  Negative for fever, malaise/fatigue and weight loss.  HENT: Negative.  Negative for ear pain and tinnitus (Began prior to immunotherapy).   Eyes: Negative.   Respiratory: Negative for cough and shortness of breath.   Cardiovascular: Positive for leg swelling. Negative for chest pain.  Gastrointestinal: Negative for abdominal pain, blood in stool and diarrhea.  Genitourinary: Negative.   Musculoskeletal: Negative.  Negative for joint pain.       Right inguinal pain.  Skin: Negative for itching and rash.  Neurological: Negative.  Negative for sensory change and weakness.  Endo/Heme/Allergies: Does not bruise/bleed easily.  Psychiatric/Behavioral: The patient has insomnia. The patient is not nervous/anxious.     As per HPI. Otherwise, a complete review of systems is  negative.  PAST MEDICAL HISTORY: Past Medical History:  Diagnosis Date  . CHF (congestive heart failure) ([place]) 2016   Dr. Nehemiah Massed  . GERD (gastroesophageal reflux disease) 2012  . Hypothyroidism   . Lumbar herniated disc 2010  . Malignant melanoma of skin of lower limb, including hip ([place]) 2008, 2017   right thigh  . Personal history of malignant melanoma of skin 2012  . Tendonitis   . Tick bite of head 07/20/2015    PAST SURGICAL HISTORY: Past Surgical History:  Procedure Laterality Date  . BACK SURGERY  2010  . BREAST CYST EXCISION  1985  . COLONOSCOPY  07/2012   [facility]  . FRACTURE SURGERY     rod removed from left femur 07/21/2014  . INGUINAL LYMPH NODE BIOPSY Right 07/17/2015   Procedure: INGUINAL LYMPH NODE BIOPSY/EXCISION;  Surgeon: Robert Bellow, MD;  Location: [facility] ORS;  Service: General;  Laterality: Right;  . MELANOMA EXCISION  2008   right thigh  . PORTACATH PLACEMENT Left 03/14/2016   Procedure: INSERTION PORT-A-CATH;  Surgeon: Robert Bellow, MD;  Location: [facility] ORS;  Service: General;  Laterality: Left;  . SKIN LESION EXCISION Right 12-19-10   posterior    FAMILY HISTORY: Family History  Problem Relation Age of Onset  . COPD Father   . Cancer Sister     Colon, sister       ADVANCED DIRECTIVES:    HEALTH MAINTENANCE: Social History  Substance Use Topics  . Smoking status: Current Every Day Smoker    Packs/day: 0.50    Years: 20.00    Types: Cigarettes  . Smokeless tobacco: Never Used  . Alcohol use 0.0 oz/week     Comment: ocassionally  Colonoscopy:  PAP:  Bone density:  Lipid panel:  Allergies  Allergen Reactions  . Clindamycin Shortness Of Breath  . Levaquin [Levofloxacin] Other (See Comments)    Joint inflammation  . Aripiprazole Other (See Comments)    Facial and head ticks  . Valproic Acid     Other reaction(s): Other (See Comments) Facial twitching    Current Outpatient Prescriptions  Medication Sig Dispense Refill   . aspirin-acetaminophen-caffeine (EXCEDRIN MIGRAINE) 250-250-65 MG tablet Take 2 tablets by mouth daily as needed for headache.    . calcium carbonate (OS-CAL) 600 MG TABS tablet Take 600 mg by mouth 2 (two) times daily with a meal.    . clonazePAM (KLONOPIN) 0.5 MG tablet Take 0.5 mg by mouth at bedtime.    Marland Kitchen dexamethasone (DECADRON) 4 MG tablet Take 1 tablet (4 mg total) by mouth daily. 30 tablet 1  . furosemide (LASIX) 20 MG tablet Take 20 mg by mouth every other day.     Marland Kitchen HYDROcodone-acetaminophen (NORCO/VICODIN) 5-325 MG tablet Take 1 tablet by mouth every 4 (four) hours as needed for moderate pain. 60 tablet 0  . hydrocortisone (CORTEF) 10 MG tablet Take 10-15 mg by mouth 2 (two) times daily. 1.5 tablets (15 MG) in the morning and 1 tablet (10 MG) in the evening    . levothyroxine (SYNTHROID, LEVOTHROID) 112 MCG tablet Take 112 mcg by mouth daily before breakfast.     . lidocaine-prilocaine (EMLA) cream Apply 1 application topically as needed. 30 g 3  . meloxicam (MOBIC) 7.5 MG tablet Take 1 tablet (7.5 mg total) by mouth daily. 30 tablet 0  . metoprolol tartrate (LOPRESSOR) 25 MG tablet Take 12.5 mg by mouth 2 (two) times daily.     . Multiple Vitamin (MULTIVITAMIN WITH MINERALS) TABS tablet Take 1 tablet by mouth daily.    . ondansetron (ZOFRAN) 4 MG tablet Take 1 tablet (4 mg total) by mouth every 8 (eight) hours as needed for nausea or vomiting. 30 tablet 2  . pantoprazole (PROTONIX) 40 MG tablet Take 40 mg by mouth 2 (two) times daily.     . potassium chloride SA (K-DUR,KLOR-CON) 20 MEQ tablet Take 1 tablet (20 mEq total) by mouth daily. 30 tablet 1  . venlafaxine XR (EFFEXOR-XR) 150 MG 24 hr capsule 150 mg every morning.     . venlafaxine XR (EFFEXOR-XR) 75 MG 24 hr capsule Take 75 mg by mouth every evening.     No current facility-administered medications for this visit.     OBJECTIVE: Vitals:   04/14/16 0958  BP: 125/76  Pulse: 63  Temp: 98 F (36.7 C)     Body mass index  is 27.35 kg/m.    ECOG FS:0 - Asymptomatic  General: Well-developed, well-nourished, no acute distress. Eyes: Pink conjunctiva, anicteric sclera. Lungs: Clear to auscultation bilaterally. Heart: Regular rate and rhythm. No rubs, murmurs, or gallops. Abdomen: Soft, nontender, nondistended. No organomegaly noted, normoactive bowel sounds. Musculoskeletal: No edema, cyanosis, or clubbing. No obvious swelling of joints, no decrease in strength. Moderate tenderness to touch. Neuro: Alert, answering all questions appropriately. Cranial nerves grossly intact. Skin: Rash noted on face, ears, and torso consistent with Zelboraf Psych: Normal affect.   LAB RESULTS:  Lab Results  Component Value Date   NA 134 (L) 04/14/2016   K 4.5 04/14/2016   CL 106 04/14/2016   CO2 23 04/14/2016   GLUCOSE 105 (H) 04/14/2016   BUN 21 (H) 04/14/2016   CREATININE 1.42 (H) 04/14/2016   CALCIUM  8.5 (L) 04/14/2016   PROT 6.7 04/14/2016   ALBUMIN 4.0 04/14/2016   AST 26 04/14/2016   ALT 14 04/14/2016   ALKPHOS 39 04/14/2016   BILITOT 0.6 04/14/2016   GFRNONAA 41 (L) 04/14/2016   GFRAA 48 (L) 04/14/2016    Lab Results  Component Value Date   WBC 8.9 04/14/2016   NEUTROABS 7.9 (H) 04/14/2016   HGB 12.5 04/14/2016   HCT 36.1 04/14/2016   MCV 91.7 04/14/2016   PLT 237 04/14/2016    STUDIES: Nm Pet Image Restage (ps) Whole Body  Result Date: 04/11/2016 CLINICAL DATA:  Subsequent treatment strategy for metastatic melanoma. Undergoing immunotherapy. EXAM: NUCLEAR MEDICINE PET WHOLE BODY TECHNIQUE: 12.2 mCi F-18 FDG was injected intravenously. Full-ring PET imaging was performed from the vertex to the feet after the radiotracer. CT data was obtained and used for attenuation correction and anatomic localization. FASTING BLOOD GLUCOSE:  Value: 95 mg/dl COMPARISON:  01/13/2016 FINDINGS: HEAD/NECK No hypermetabolic activity in the scalp. No hypermetabolic cervical lymph nodes. CHEST New mild hypermetabolic  mediastinal lymphadenopathy is seen in the right paratracheal, AP window and subcarinal regions. Index lymph node in the right paratracheal region measures 11 mm on image 117/3, with SUV max of 3.5. No hypermetabolic hilar or axillary lymph nodes. No suspicious pulmonary nodules seen on CT images. ABDOMEN/PELVIS No abnormal hypermetabolic activity within the liver, pancreas, adrenal glands, or spleen. New hypermetabolic portacaval lymph node is seen measuring 10 mm on image 184/3, with SUV max of 4.9. Right external iliac lymphadenopathy has decreased since previous study, with largest lymph node measuring 1.6 cm on image 260/3 compared to 2.3 cm previously. Current SUV max is 4.2 compared with 20.4 previously. No new pelvic lymphadenopathy identified. SKELETON No focal hypermetabolic activity to suggest skeletal metastasis. EXTREMITIES No abnormal hypermetabolic activity in the lower extremities. IMPRESSION: Decreased hypermetabolic right external iliac lymphadenopathy. New mild hypermetabolic lymphadenopathy in the mediastinum and portacaval space. Electronically Signed   By: Earle Gell M.D.   On: 04/11/2016 11:19    ONCOLOGIC HISTORY: Patient initially diagnosed with metastatic melanoma to right thigh with unknown primary in 2009. She received 4 weeks of high-dose interferon therapy in February 2009 and proceed with low-dose interferon therapy in March 2009. Patient remained in remission until August 2012 when she was noted to have recurrent melanoma and a pelvic lymph node. October 2012 she had 2 cycles of Ipilumimab which was discontinued secondary to poor tolerance. In April 2017 PET scan showed recurrent disease in the right inguinal region which was confirmed by biopsy. Zeboraf discontinued in November 2017 secondary to intolerable rash. Patient initiated combination immunotherapy with nivolumab and ipilumimab on January 14, 2016. Patient initiated maintenance nivolumab on April 14, 2016.  ASSESSMENT: Recurrent metastatic melanoma, BRAF+, of right lower extremity, metastatic.  PLAN:    1. Recurrent metastatic melanoma, BRAF+, of right lower extremity, metastatic:  PET scan results from April 11, 2016 reviewed Independently with overall improvement of disease burden. Despite decreased hypermetabolic right external iliac lymphadenopathy, patient had new mild hypermetabolic lymphadenopathy in the mediastinum and portacaval space. Previously, patient did not want to attempt another oral medication or oral combination such as dabrafenib/trametinib or cobimetinib. She has now completed 4 cycles of 1 mg/kg nivolumab + 3 mg/kg ipilumimab. Proceed with cycle 1 of maintenance treatment with 240 mg flat dose nivolumab every 2 weeks until disease progression or unacceptable toxicity. Return to clinic in 2 weeks for consideration cycle 2 of maintenance nivolumab.  Will re-image with PET scan  in approximately 3 months.  2. Pain: Continue Vicodin as needed. Meloxicam was prescribed for body aches. Improved with steroids, but will decrease dose to 2 mg dexamethazone as needed. Patient has been educated on the long-term side effects of persistent steroid use, but she wishes to continue current treatment.  3. Nausea: Continue Compazine as needed. 4. Diarrhea: Continue Imodium OTC as needed  Approximately 30 minutes was spent in discussion of which greater than 50% was consultation.  Patient expressed understanding and was in agreement with this plan. She also understands that She can call clinic at any time with any questions, concerns, or complaints.    Lloyd Huger, MD 04/16/16 4:16 PM

## 2016-04-13 NOTE — Telephone Encounter (Signed)
Received request to refill Meloxicam, this was discontinued on 03/16/16 by RN Janina Mayo. Dis cussed with Dr Grayland Ormond and he will discuss with patient at her appt tomorrow morning

## 2016-04-14 ENCOUNTER — Inpatient Hospital Stay: Payer: Medicare Other

## 2016-04-14 ENCOUNTER — Inpatient Hospital Stay (HOSPITAL_BASED_OUTPATIENT_CLINIC_OR_DEPARTMENT_OTHER): Payer: Medicare Other | Admitting: Oncology

## 2016-04-14 ENCOUNTER — Inpatient Hospital Stay: Payer: Medicare Other | Attending: Oncology

## 2016-04-14 VITALS — BP 125/76 | HR 63 | Temp 98.0°F | Ht 67.0 in | Wt 174.6 lb

## 2016-04-14 DIAGNOSIS — M7989 Other specified soft tissue disorders: Secondary | ICD-10-CM | POA: Diagnosis not present

## 2016-04-14 DIAGNOSIS — M2550 Pain in unspecified joint: Secondary | ICD-10-CM | POA: Insufficient documentation

## 2016-04-14 DIAGNOSIS — R197 Diarrhea, unspecified: Secondary | ICD-10-CM | POA: Insufficient documentation

## 2016-04-14 DIAGNOSIS — I509 Heart failure, unspecified: Secondary | ICD-10-CM

## 2016-04-14 DIAGNOSIS — F17210 Nicotine dependence, cigarettes, uncomplicated: Secondary | ICD-10-CM

## 2016-04-14 DIAGNOSIS — Z800 Family history of malignant neoplasm of digestive organs: Secondary | ICD-10-CM

## 2016-04-14 DIAGNOSIS — R110 Nausea: Secondary | ICD-10-CM | POA: Insufficient documentation

## 2016-04-14 DIAGNOSIS — Z5112 Encounter for antineoplastic immunotherapy: Secondary | ICD-10-CM | POA: Insufficient documentation

## 2016-04-14 DIAGNOSIS — G893 Neoplasm related pain (acute) (chronic): Secondary | ICD-10-CM | POA: Insufficient documentation

## 2016-04-14 DIAGNOSIS — C439 Malignant melanoma of skin, unspecified: Secondary | ICD-10-CM

## 2016-04-14 DIAGNOSIS — C4371 Malignant melanoma of right lower limb, including hip: Secondary | ICD-10-CM | POA: Insufficient documentation

## 2016-04-14 DIAGNOSIS — K219 Gastro-esophageal reflux disease without esophagitis: Secondary | ICD-10-CM | POA: Diagnosis not present

## 2016-04-14 DIAGNOSIS — E039 Hypothyroidism, unspecified: Secondary | ICD-10-CM | POA: Insufficient documentation

## 2016-04-14 DIAGNOSIS — Z79899 Other long term (current) drug therapy: Secondary | ICD-10-CM | POA: Insufficient documentation

## 2016-04-14 DIAGNOSIS — C799 Secondary malignant neoplasm of unspecified site: Secondary | ICD-10-CM

## 2016-04-14 LAB — CBC WITH DIFFERENTIAL/PLATELET
BASOS ABS: 0.1 10*3/uL (ref 0–0.1)
BASOS PCT: 1 %
Eosinophils Absolute: 0.1 10*3/uL (ref 0–0.7)
Eosinophils Relative: 1 %
HEMATOCRIT: 36.1 % (ref 35.0–47.0)
Hemoglobin: 12.5 g/dL (ref 12.0–16.0)
LYMPHS PCT: 7 %
Lymphs Abs: 0.6 10*3/uL — ABNORMAL LOW (ref 1.0–3.6)
MCH: 31.8 pg (ref 26.0–34.0)
MCHC: 34.7 g/dL (ref 32.0–36.0)
MCV: 91.7 fL (ref 80.0–100.0)
MONO ABS: 0.2 10*3/uL (ref 0.2–0.9)
Monocytes Relative: 2 %
Neutro Abs: 7.9 10*3/uL — ABNORMAL HIGH (ref 1.4–6.5)
Neutrophils Relative %: 89 %
PLATELETS: 237 10*3/uL (ref 150–440)
RBC: 3.94 MIL/uL (ref 3.80–5.20)
RDW: 14.8 % — AB (ref 11.5–14.5)
WBC: 8.9 10*3/uL (ref 3.6–11.0)

## 2016-04-14 LAB — COMPREHENSIVE METABOLIC PANEL
ALBUMIN: 4 g/dL (ref 3.5–5.0)
ALT: 14 U/L (ref 14–54)
AST: 26 U/L (ref 15–41)
Alkaline Phosphatase: 39 U/L (ref 38–126)
Anion gap: 5 (ref 5–15)
BILIRUBIN TOTAL: 0.6 mg/dL (ref 0.3–1.2)
BUN: 21 mg/dL — AB (ref 6–20)
CHLORIDE: 106 mmol/L (ref 101–111)
CO2: 23 mmol/L (ref 22–32)
CREATININE: 1.42 mg/dL — AB (ref 0.44–1.00)
Calcium: 8.5 mg/dL — ABNORMAL LOW (ref 8.9–10.3)
GFR calc Af Amer: 48 mL/min — ABNORMAL LOW (ref >60)
GFR, EST NON AFRICAN AMERICAN: 41 mL/min — AB (ref >60)
GLUCOSE: 105 mg/dL — AB (ref 65–99)
Potassium: 4.5 mmol/L (ref 3.5–5.1)
Sodium: 134 mmol/L — ABNORMAL LOW (ref 135–145)
Total Protein: 6.7 g/dL (ref 6.5–8.1)

## 2016-04-14 MED ORDER — SODIUM CHLORIDE 0.9% FLUSH
10.0000 mL | INTRAVENOUS | Status: DC | PRN
  Administered 2016-04-14: 10 mL
  Filled 2016-04-14: qty 10

## 2016-04-14 MED ORDER — HYDROCODONE-ACETAMINOPHEN 5-325 MG PO TABS: 1.0000 | ORAL_TABLET | ORAL | 0 refills | Status: DC | PRN

## 2016-04-14 MED ORDER — NIVOLUMAB CHEMO INJECTION 100 MG/10ML
240.0000 mg | Freq: Once | INTRAVENOUS | Status: AC
  Administered 2016-04-14: 240 mg via INTRAVENOUS
  Filled 2016-04-14: qty 20

## 2016-04-14 MED ORDER — SODIUM CHLORIDE 0.9 % IV SOLN
Freq: Once | INTRAVENOUS | Status: AC
  Administered 2016-04-14: 11:00:00 via INTRAVENOUS
  Filled 2016-04-14: qty 1000

## 2016-04-14 MED ORDER — HEPARIN SOD (PORK) LOCK FLUSH 100 UNIT/ML IV SOLN
500.0000 [IU] | Freq: Once | INTRAVENOUS | Status: AC | PRN
  Administered 2016-04-14: 500 [IU]
  Filled 2016-04-14: qty 5

## 2016-04-14 NOTE — Progress Notes (Signed)
Patient here for pre treatment check. No new problems since  Last appointment.

## 2016-04-15 ENCOUNTER — Telehealth: Payer: Self-pay

## 2016-04-15 LAB — THYROID PANEL WITH TSH
FREE THYROXINE INDEX: 1.5 (ref 1.2–4.9)
T3 UPTAKE RATIO: 25 % (ref 24–39)
T4, Total: 5.9 ug/dL (ref 4.5–12.0)
TSH: 0.163 u[IU]/mL — ABNORMAL LOW (ref 0.450–4.500)

## 2016-04-15 NOTE — Telephone Encounter (Signed)
Left message on voicemail that we have no control of when it will be posted to [facility] but if she has any other questions or concerns to call me back at the [place] office.

## 2016-04-15 NOTE — Telephone Encounter (Signed)
Patient called and left message, she  would like to have her results placed on her mychart so he has the final report.

## 2016-04-15 NOTE — Telephone Encounter (Signed)
We have no control when results appear on my chart.

## 2016-04-27 NOTE — Progress Notes (Signed)
[facility]  [phone] [phone]  ID: Patricia Li OB: 06/18/1960  MR#: [id number]  CSN#:[id number]  Patient Care Team: Namrata Shidhaye, MD as PCP - General (Family Medicine) Jeffrey W Byrnett, MD as Consulting Physician (General Surgery) Janak Choksi, MD (Oncology)  J , MD as Consulting Physician (Oncology)  CHIEF COMPLAINT: Recurrent metastatic melanoma, BRAF+, of right lower extremity, metastatic.  INTERVAL HISTORY: Patient returns to clinic today for further evaluation and consideration of cycle 2 of maintenance nivolumab. Her painful and swollen joints on her hands and feet have improved on steroids. Patient states she only takes 4 mg dexamethasone every 2-3 days. She is no longer taking meloxicam. She has no neurological complaints. She denies any recent fevers or illnesses. She has a good appetite. She denies any chest pain or shortness of breath. She has no nausea, vomiting, or constipation. She has no urinary complaints. Patient otherwise feels well and offers no further specific complaints.   REVIEW OF SYSTEMS:   Review of Systems  Constitutional: Negative.  Negative for fever, malaise/fatigue and weight loss.  HENT: Negative.  Negative for ear pain and tinnitus (Began prior to immunotherapy).   Eyes: Negative.   Respiratory: Negative for cough and shortness of breath.   Cardiovascular: Positive for leg swelling. Negative for chest pain.  Gastrointestinal: Negative for abdominal pain, blood in stool and diarrhea.  Genitourinary: Negative.   Musculoskeletal: Negative.  Negative for joint pain.       Right inguinal pain.  Skin: Negative for itching and rash.  Neurological: Negative.  Negative for sensory change and weakness.  Endo/Heme/Allergies: Does not bruise/bleed easily.  Psychiatric/Behavioral: The patient has insomnia. The patient is not nervous/anxious.     As per HPI. Otherwise, a complete review of  systems is negative.  PAST MEDICAL HISTORY: Past Medical History:  Diagnosis Date  . CHF (congestive heart failure) (HCC) 2016   Dr. Kowalski  . GERD (gastroesophageal reflux disease) 2012  . Hypothyroidism   . Lumbar herniated disc 2010  . Malignant melanoma of skin of lower limb, including hip (HCC) 2008, 2017   right thigh  . Personal history of malignant melanoma of skin 2012  . Tendonitis   . Tick bite of head 07/20/2015    PAST SURGICAL HISTORY: Past Surgical History:  Procedure Laterality Date  . BACK SURGERY  2010  . BREAST CYST EXCISION  1985  . COLONOSCOPY  07/2012   [facility]  . FRACTURE SURGERY     rod removed from left femur 07/21/2014  . INGUINAL LYMPH NODE BIOPSY Right 07/17/2015   Procedure: INGUINAL LYMPH NODE BIOPSY/EXCISION;  Surgeon: Jeffrey W Byrnett, MD;  Location: [facility] ORS;  Service: General;  Laterality: Right;  . MELANOMA EXCISION  2008   right thigh  . PORTACATH PLACEMENT Left 03/14/2016   Procedure: INSERTION PORT-A-CATH;  Surgeon: Jeffrey W Byrnett, MD;  Location: [facility] ORS;  Service: General;  Laterality: Left;  . SKIN LESION EXCISION Right 12-19-10   posterior    FAMILY HISTORY: Family History  Problem Relation Age of Onset  . COPD Father   . Cancer Sister     Colon, sister       ADVANCED DIRECTIVES:    HEALTH MAINTENANCE: Social History  Substance Use Topics  . Smoking status: Current Every Day Smoker    Packs/day: 0.50    Years: 20.00    Types: Cigarettes  . Smokeless tobacco: Never Used  . Alcohol use 0.0 oz/week     Comment: ocassionally       Colonoscopy:  PAP:  Bone density:  Lipid panel:  Allergies  Allergen Reactions  . Clindamycin Shortness Of Breath  . Levaquin [Levofloxacin] Other (See Comments)    Joint inflammation  . Aripiprazole Other (See Comments)    Facial and head ticks  . Valproic Acid     Other reaction(s): Other (See Comments) Facial twitching    Current Outpatient Prescriptions  Medication Sig  Dispense Refill  . aspirin-acetaminophen-caffeine (EXCEDRIN MIGRAINE) 250-250-65 MG tablet Take 2 tablets by mouth daily as needed for headache.    . calcium carbonate (OS-CAL) 600 MG TABS tablet Take 600 mg by mouth 2 (two) times daily with a meal.    . clonazePAM (KLONOPIN) 0.5 MG tablet Take 0.5 mg by mouth at bedtime.    . dexamethasone (DECADRON) 4 MG tablet Take 1 tablet (4 mg total) by mouth daily. 30 tablet 1  . furosemide (LASIX) 20 MG tablet Take 20 mg by mouth every other day.     . HYDROcodone-acetaminophen (NORCO/VICODIN) 5-325 MG tablet Take 1 tablet by mouth every 4 (four) hours as needed for moderate pain. 60 tablet 0  . hydrocortisone (CORTEF) 10 MG tablet Take 10-15 mg by mouth 2 (two) times daily. 1.5 tablets (15 MG) in the morning and 1 tablet (10 MG) in the evening    . levothyroxine (SYNTHROID, LEVOTHROID) 112 MCG tablet Take 112 mcg by mouth daily before breakfast.     . lidocaine-prilocaine (EMLA) cream Apply 1 application topically as needed. 30 g 3  . meloxicam (MOBIC) 7.5 MG tablet Take 1 tablet (7.5 mg total) by mouth daily. 30 tablet 0  . metoprolol tartrate (LOPRESSOR) 25 MG tablet Take 12.5 mg by mouth 2 (two) times daily.     . Multiple Vitamin (MULTIVITAMIN WITH MINERALS) TABS tablet Take 1 tablet by mouth daily.    . ondansetron (ZOFRAN) 4 MG tablet Take 1 tablet (4 mg total) by mouth every 8 (eight) hours as needed for nausea or vomiting. 30 tablet 2  . pantoprazole (PROTONIX) 40 MG tablet Take 40 mg by mouth 2 (two) times daily.     . potassium chloride SA (K-DUR,KLOR-CON) 20 MEQ tablet Take 1 tablet (20 mEq total) by mouth daily. 30 tablet 1  . venlafaxine XR (EFFEXOR-XR) 150 MG 24 hr capsule 150 mg every morning.     . venlafaxine XR (EFFEXOR-XR) 75 MG 24 hr capsule Take 75 mg by mouth every evening.     No current facility-administered medications for this visit.     OBJECTIVE: Vitals:   04/28/16 1013  BP: 132/85  Pulse: 61  Resp: 18  Temp: 98.2 F  (36.8 C)     Body mass index is 27.31 kg/m.    ECOG FS:0 - Asymptomatic  General: Well-developed, well-nourished, no acute distress. Eyes: Pink conjunctiva, anicteric sclera. Lungs: Clear to auscultation bilaterally. Heart: Regular rate and rhythm. No rubs, murmurs, or gallops. Abdomen: Soft, nontender, nondistended. No organomegaly noted, normoactive bowel sounds. Musculoskeletal: No edema, cyanosis, or clubbing. No obvious swelling of joints, no decrease in strength. Mild tenderness to touch. Neuro: Alert, answering all questions appropriately. Cranial nerves grossly intact. Skin: Rash noted on face, ears, and torso consistent with Zelboraf Psych: Normal affect.   LAB RESULTS:  Lab Results  Component Value Date   NA 134 (L) 04/28/2016   K 4.5 04/28/2016   CL 103 04/28/2016   CO2 23 04/28/2016   GLUCOSE 120 (H) 04/28/2016   BUN 28 (H) 04/28/2016   CREATININE 1.38 (H) 04/28/2016     CALCIUM 8.9 04/28/2016   PROT 7.1 04/28/2016   ALBUMIN 4.4 04/28/2016   AST 34 04/28/2016   ALT 16 04/28/2016   ALKPHOS 46 04/28/2016   BILITOT 0.7 04/28/2016   GFRNONAA 42 (L) 04/28/2016   GFRAA 49 (L) 04/28/2016    Lab Results  Component Value Date   WBC 10.5 04/28/2016   NEUTROABS 9.4 (H) 04/28/2016   HGB 12.7 04/28/2016   HCT 36.7 04/28/2016   MCV 91.8 04/28/2016   PLT 277 04/28/2016    STUDIES: Nm Pet Image Restage (ps) Whole Body  Result Date: 04/11/2016 CLINICAL DATA:  Subsequent treatment strategy for metastatic melanoma. Undergoing immunotherapy. EXAM: NUCLEAR MEDICINE PET WHOLE BODY TECHNIQUE: 12.2 mCi F-18 FDG was injected intravenously. Full-ring PET imaging was performed from the vertex to the feet after the radiotracer. CT data was obtained and used for attenuation correction and anatomic localization. FASTING BLOOD GLUCOSE:  Value: 95 mg/dl COMPARISON:  01/13/2016 FINDINGS: HEAD/NECK No hypermetabolic activity in the scalp. No hypermetabolic cervical lymph nodes. CHEST New  mild hypermetabolic mediastinal lymphadenopathy is seen in the right paratracheal, AP window and subcarinal regions. Index lymph node in the right paratracheal region measures 11 mm on image 117/3, with SUV max of 3.5. No hypermetabolic hilar or axillary lymph nodes. No suspicious pulmonary nodules seen on CT images. ABDOMEN/PELVIS No abnormal hypermetabolic activity within the liver, pancreas, adrenal glands, or spleen. New hypermetabolic portacaval lymph node is seen measuring 10 mm on image 184/3, with SUV max of 4.9. Right external iliac lymphadenopathy has decreased since previous study, with largest lymph node measuring 1.6 cm on image 260/3 compared to 2.3 cm previously. Current SUV max is 4.2 compared with 20.4 previously. No new pelvic lymphadenopathy identified. SKELETON No focal hypermetabolic activity to suggest skeletal metastasis. EXTREMITIES No abnormal hypermetabolic activity in the lower extremities. IMPRESSION: Decreased hypermetabolic right external iliac lymphadenopathy. New mild hypermetabolic lymphadenopathy in the mediastinum and portacaval space. Electronically Signed   By: Earle Gell M.D.   On: 04/11/2016 11:19    ONCOLOGIC HISTORY: Patient initially diagnosed with metastatic melanoma to right thigh with unknown primary in 2009. She received 4 weeks of high-dose interferon therapy in February 2009 and proceed with low-dose interferon therapy in March 2009. Patient remained in remission until August 2012 when she was noted to have recurrent melanoma and a pelvic lymph node. October 2012 she had 2 cycles of Ipilumimab which was discontinued secondary to poor tolerance. In April 2017 PET scan showed recurrent disease in the right inguinal region which was confirmed by biopsy. Zeboraf discontinued in November 2017 secondary to intolerable rash. Patient initiated combination immunotherapy with nivolumab and ipilumimab on January 14, 2016. Patient initiated maintenance nivolumab on April 14, 2016.  ASSESSMENT: Recurrent metastatic melanoma, BRAF+, of right lower extremity, metastatic.  PLAN:    1. Recurrent metastatic melanoma, BRAF+, of right lower extremity, metastatic:  PET scan results from April 11, 2016 reviewed Independently with overall improvement of disease burden. Despite decreased hypermetabolic right external iliac lymphadenopathy, patient had new mild hypermetabolic lymphadenopathy in the mediastinum and portacaval space. Previously, patient did not want to attempt another oral medication or oral combination such as dabrafenib/trametinib or cobimetinib. She has completed 4 cycles of 1 mg/kg nivolumab + 3 mg/kg ipilumimab. Proceed with cycle 2 of maintenance treatment with 240 mg flat dose nivolumab every 2 weeks until disease progression or unacceptable toxicity. Return to clinic in 2 weeks for consideration cycle 3 of maintenance nivolumab.  Will re-image with PET scan in  approximately May 2018.  2. Pain: Continue Vicodin as needed. Patient states she only takes 4 mg of dexamethasone every 2-3 days. She has discontinued meloxicam altogether.  Patient has been educated on the long-term side effects of persistent steroid use, but she wishes to continue current treatment.  3. Nausea: Continue Compazine as needed. 4. Diarrhea: Continue Imodium OTC as needed  Approximately 30 minutes was spent in discussion of which greater than 50% was consultation.  Patient expressed understanding and was in agreement with this plan. She also understands that She can call clinic at any time with any questions, concerns, or complaints.     J , MD 04/28/16 10:32 AM      

## 2016-04-28 ENCOUNTER — Inpatient Hospital Stay: Payer: Medicare Other

## 2016-04-28 ENCOUNTER — Inpatient Hospital Stay: Payer: Medicare Other | Attending: Oncology | Admitting: Oncology

## 2016-04-28 VITALS — BP 132/85 | HR 61 | Temp 98.2°F | Resp 18 | Wt 174.4 lb

## 2016-04-28 DIAGNOSIS — C799 Secondary malignant neoplasm of unspecified site: Secondary | ICD-10-CM

## 2016-04-28 DIAGNOSIS — R110 Nausea: Secondary | ICD-10-CM | POA: Diagnosis not present

## 2016-04-28 DIAGNOSIS — C439 Malignant melanoma of skin, unspecified: Secondary | ICD-10-CM

## 2016-04-28 DIAGNOSIS — C4371 Malignant melanoma of right lower limb, including hip: Secondary | ICD-10-CM

## 2016-04-28 DIAGNOSIS — K219 Gastro-esophageal reflux disease without esophagitis: Secondary | ICD-10-CM

## 2016-04-28 DIAGNOSIS — Z79899 Other long term (current) drug therapy: Secondary | ICD-10-CM | POA: Diagnosis not present

## 2016-04-28 DIAGNOSIS — Z800 Family history of malignant neoplasm of digestive organs: Secondary | ICD-10-CM | POA: Insufficient documentation

## 2016-04-28 DIAGNOSIS — Z5112 Encounter for antineoplastic immunotherapy: Secondary | ICD-10-CM | POA: Diagnosis not present

## 2016-04-28 DIAGNOSIS — R197 Diarrhea, unspecified: Secondary | ICD-10-CM | POA: Insufficient documentation

## 2016-04-28 DIAGNOSIS — E039 Hypothyroidism, unspecified: Secondary | ICD-10-CM | POA: Insufficient documentation

## 2016-04-28 DIAGNOSIS — M7989 Other specified soft tissue disorders: Secondary | ICD-10-CM | POA: Diagnosis not present

## 2016-04-28 DIAGNOSIS — R590 Localized enlarged lymph nodes: Secondary | ICD-10-CM | POA: Diagnosis not present

## 2016-04-28 DIAGNOSIS — F17210 Nicotine dependence, cigarettes, uncomplicated: Secondary | ICD-10-CM | POA: Diagnosis not present

## 2016-04-28 DIAGNOSIS — Z85820 Personal history of malignant melanoma of skin: Secondary | ICD-10-CM | POA: Diagnosis not present

## 2016-04-28 DIAGNOSIS — I509 Heart failure, unspecified: Secondary | ICD-10-CM | POA: Insufficient documentation

## 2016-04-28 DIAGNOSIS — G4700 Insomnia, unspecified: Secondary | ICD-10-CM | POA: Insufficient documentation

## 2016-04-28 LAB — CBC WITH DIFFERENTIAL/PLATELET
Basophils Absolute: 0.1 10*3/uL (ref 0–0.1)
Basophils Relative: 1 %
EOS PCT: 1 %
Eosinophils Absolute: 0.1 10*3/uL (ref 0–0.7)
HCT: 36.7 % (ref 35.0–47.0)
Hemoglobin: 12.7 g/dL (ref 12.0–16.0)
LYMPHS ABS: 0.8 10*3/uL — AB (ref 1.0–3.6)
LYMPHS PCT: 8 %
MCH: 31.9 pg (ref 26.0–34.0)
MCHC: 34.7 g/dL (ref 32.0–36.0)
MCV: 91.8 fL (ref 80.0–100.0)
Monocytes Absolute: 0.1 10*3/uL — ABNORMAL LOW (ref 0.2–0.9)
Monocytes Relative: 1 %
Neutro Abs: 9.4 10*3/uL — ABNORMAL HIGH (ref 1.4–6.5)
Neutrophils Relative %: 89 %
PLATELETS: 277 10*3/uL (ref 150–440)
RBC: 4 MIL/uL (ref 3.80–5.20)
RDW: 14.9 % — ABNORMAL HIGH (ref 11.5–14.5)
WBC: 10.5 10*3/uL (ref 3.6–11.0)

## 2016-04-28 LAB — COMPREHENSIVE METABOLIC PANEL
ALK PHOS: 46 U/L (ref 38–126)
ALT: 16 U/L (ref 14–54)
AST: 34 U/L (ref 15–41)
Albumin: 4.4 g/dL (ref 3.5–5.0)
Anion gap: 8 (ref 5–15)
BUN: 28 mg/dL — ABNORMAL HIGH (ref 6–20)
CALCIUM: 8.9 mg/dL (ref 8.9–10.3)
CHLORIDE: 103 mmol/L (ref 101–111)
CO2: 23 mmol/L (ref 22–32)
Creatinine, Ser: 1.38 mg/dL — ABNORMAL HIGH (ref 0.44–1.00)
GFR, EST AFRICAN AMERICAN: 49 mL/min — AB (ref >60)
GFR, EST NON AFRICAN AMERICAN: 42 mL/min — AB (ref >60)
Glucose, Bld: 120 mg/dL — ABNORMAL HIGH (ref 65–99)
Potassium: 4.5 mmol/L (ref 3.5–5.1)
Sodium: 134 mmol/L — ABNORMAL LOW (ref 135–145)
TOTAL PROTEIN: 7.1 g/dL (ref 6.5–8.1)
Total Bilirubin: 0.7 mg/dL (ref 0.3–1.2)

## 2016-04-28 MED ORDER — HEPARIN SOD (PORK) LOCK FLUSH 100 UNIT/ML IV SOLN
500.0000 [IU] | Freq: Once | INTRAVENOUS | Status: AC | PRN
  Administered 2016-04-28: 500 [IU]
  Filled 2016-04-28: qty 5

## 2016-04-28 MED ORDER — SODIUM CHLORIDE 0.9 % IV SOLN
Freq: Once | INTRAVENOUS | Status: AC
  Administered 2016-04-28: 11:00:00 via INTRAVENOUS
  Filled 2016-04-28: qty 1000

## 2016-04-28 MED ORDER — SODIUM CHLORIDE 0.9 % IV SOLN
240.0000 mg | Freq: Once | INTRAVENOUS | Status: AC
  Administered 2016-04-28: 240 mg via INTRAVENOUS
  Filled 2016-04-28: qty 24

## 2016-04-28 NOTE — Progress Notes (Signed)
Patient states she is constantly coughing.  C/o arthritis pain.

## 2016-04-29 LAB — THYROID PANEL WITH TSH
Free Thyroxine Index: 1.8 (ref 1.2–4.9)
T3 Uptake Ratio: 27 % (ref 24–39)
T4 TOTAL: 6.5 ug/dL (ref 4.5–12.0)
TSH: 0.126 u[IU]/mL — AB (ref 0.450–4.500)

## 2016-05-03 ENCOUNTER — Ambulatory Visit (INDEPENDENT_AMBULATORY_CARE_PROVIDER_SITE_OTHER): Payer: Medicare Other | Admitting: General Surgery

## 2016-05-03 VITALS — BP 126/82 | HR 60 | Resp 12 | Ht 67.0 in | Wt 178.0 lb

## 2016-05-03 DIAGNOSIS — T827XXA Infection and inflammatory reaction due to other cardiac and vascular devices, implants and grafts, initial encounter: Secondary | ICD-10-CM | POA: Diagnosis not present

## 2016-05-03 MED ORDER — DOXYCYCLINE HYCLATE 100 MG PO CAPS: 100.0000 mg | ORAL_CAPSULE | Freq: Two times a day (BID) | ORAL | 0 refills | Status: DC

## 2016-05-03 NOTE — Progress Notes (Signed)
Patient ID: Patricia Li, female   DOB: [birth year], 56 y.o.   MRN: [id number]  Chief Complaint  Patient presents with  . Wound Check    HPI Patricia Li is a 56 y.o. female.  She states she noticed some serous white drainage yesterday at the inner end of the port incision. She states it does itch. She is receiving treatments every 2 weeks. It was used last Thursday. She is here today with her sister, Patricia Li.  HPI  Past Medical History:  Diagnosis Date  . CHF (congestive heart failure) ([place]) 2016   Dr. Nehemiah Massed  . GERD (gastroesophageal reflux disease) 2012  . Hypothyroidism   . Lumbar herniated disc 2010  . Malignant melanoma of skin of lower limb, including hip ([place]) 2008, 2017   right thigh  . Personal history of malignant melanoma of skin 2012  . Tendonitis   . Tick bite of head 07/20/2015    Past Surgical History:  Procedure Laterality Date  . BACK SURGERY  2010  . BREAST CYST EXCISION  1985  . COLONOSCOPY  07/2012   [facility]  . FRACTURE SURGERY     rod removed from left femur 07/21/2014  . INGUINAL LYMPH NODE BIOPSY Right 07/17/2015   Procedure: INGUINAL LYMPH NODE BIOPSY/EXCISION;  Surgeon: Robert Bellow, MD;  Location: [facility] ORS;  Service: General;  Laterality: Right;  . MELANOMA EXCISION  2008   right thigh  . PORTACATH PLACEMENT Left 03/14/2016   Procedure: INSERTION PORT-A-CATH;  Surgeon: Robert Bellow, MD;  Location: [facility] ORS;  Service: General;  Laterality: Left;  . SKIN LESION EXCISION Right 12-19-10   posterior    Family History  Problem Relation Age of Onset  . COPD Father   . Cancer Sister     Colon, sister    Social History Social History  Substance Use Topics  . Smoking status: Current Every Day Smoker    Packs/day: 0.50    Years: 20.00    Types: Cigarettes  . Smokeless tobacco: Never Used  . Alcohol use 0.0 oz/week     Comment: ocassionally    Allergies  Allergen Reactions  . Clindamycin Shortness Of Breath  . Levaquin  [Levofloxacin] Other (See Comments)    Joint inflammation  . Aripiprazole Other (See Comments)    Facial and head ticks  . Valproic Acid     Other reaction(s): Other (See Comments) Facial twitching    Current Outpatient Prescriptions  Medication Sig Dispense Refill  . aspirin-acetaminophen-caffeine (EXCEDRIN MIGRAINE) 250-250-65 MG tablet Take 2 tablets by mouth daily as needed for headache.    . calcium carbonate (OS-CAL) 600 MG TABS tablet Take 600 mg by mouth 2 (two) times daily with a meal.    . clonazePAM (KLONOPIN) 0.5 MG tablet Take 0.5 mg by mouth at bedtime.    Marland Kitchen dexamethasone (DECADRON) 4 MG tablet Take 1 tablet (4 mg total) by mouth daily. 30 tablet 1  . furosemide (LASIX) 20 MG tablet Take 20 mg by mouth every other day.     Marland Kitchen HYDROcodone-acetaminophen (NORCO/VICODIN) 5-325 MG tablet Take 1 tablet by mouth every 4 (four) hours as needed for moderate pain. 60 tablet 0  . hydrocortisone (CORTEF) 10 MG tablet Take 10-15 mg by mouth 2 (two) times daily. 1.5 tablets (15 MG) in the morning and 1 tablet (10 MG) in the evening    . levothyroxine (SYNTHROID, LEVOTHROID) 112 MCG tablet Take 112 mcg by mouth daily before breakfast.     . lidocaine-prilocaine (  EMLA) cream Apply 1 application topically as needed. 30 g 3  . meloxicam (MOBIC) 7.5 MG tablet Take 1 tablet (7.5 mg total) by mouth daily. 30 tablet 0  . metoprolol tartrate (LOPRESSOR) 25 MG tablet Take 12.5 mg by mouth 2 (two) times daily.     . Multiple Vitamin (MULTIVITAMIN WITH MINERALS) TABS tablet Take 1 tablet by mouth daily.    . ondansetron (ZOFRAN) 4 MG tablet Take 1 tablet (4 mg total) by mouth every 8 (eight) hours as needed for nausea or vomiting. 30 tablet 2  . pantoprazole (PROTONIX) 40 MG tablet Take 40 mg by mouth 2 (two) times daily.     . potassium chloride SA (K-DUR,KLOR-CON) 20 MEQ tablet Take 1 tablet (20 mEq total) by mouth daily. 30 tablet 1  . venlafaxine XR (EFFEXOR-XR) 150 MG 24 hr capsule 150 mg every  morning.     . venlafaxine XR (EFFEXOR-XR) 75 MG 24 hr capsule Take 75 mg by mouth every evening.    Marland Kitchen doxycycline (VIBRAMYCIN) 100 MG capsule Take 1 capsule (100 mg total) by mouth 2 (two) times daily. 20 capsule 0   No current facility-administered medications for this visit.     Review of Systems Review of Systems  Blood pressure 126/82, pulse 60, resp. rate 12, height 5\' 7"  (1.702 m), weight 178 lb (80.7 kg).  Physical Exam Physical Exam  Constitutional: She is oriented to person, place, and time. She appears well-developed and well-nourished.  Pulmonary/Chest:    Neurological: She is alert and oriented to person, place, and time.  Skin: Skin is warm and dry.  Psychiatric: Her behavior is normal.    Data Reviewed The area was cleansed with alcohol followed by 3 mL of 1% plain Xylocaine. Making use of an 11 blade the medial aspect of the incision was opened. No purulent fluid. Culture obtained. Area debrided with gauze sponge. Peroxide rinse. Single 4-0 Prolene suture. Telfa and Tegaderm dressing. The area did not track to the port itself and appeared to be at least a centimeter away from the port and the catheter.  Assessment    Likely suture reaction rather than an abscess.    Plan    The patient will be placed on doxycycline 100 mg by mouth twice a day.    Suture removal one week.  The port may be accessed for her upcoming treatment on Thursday, March 8.  This information has been scribed by Karie Fetch RN, BSN,BC.  Robert Bellow 05/04/2016, 3:00 PM

## 2016-05-03 NOTE — Patient Instructions (Signed)
The patient is aware to call back for any questions or concerns.  

## 2016-05-04 DIAGNOSIS — T827XXA Infection and inflammatory reaction due to other cardiac and vascular devices, implants and grafts, initial encounter: Secondary | ICD-10-CM | POA: Insufficient documentation

## 2016-05-05 ENCOUNTER — Ambulatory Visit: Payer: Medicare Other

## 2016-05-07 LAB — ANAEROBIC AND AEROBIC CULTURE

## 2016-05-11 NOTE — Progress Notes (Signed)
[facility]  [phone] [phone]  ID: [name] OB: July 30, 1960  MR#: [id number]  BMS#:[id number]  Patient Care Team: Donnie Mesa, MD as PCP - General (Family Medicine) Robert Bellow, MD as Consulting Physician (General Surgery) Forest Gleason, MD (Oncology) Lloyd Huger, MD as Consulting Physician (Oncology)  CHIEF COMPLAINT: Recurrent metastatic melanoma, BRAF+, of right lower extremity, metastatic.  INTERVAL HISTORY: Patient returns to clinic today for further evaluation and consideration of cycle 3 of maintenance nivolumab. She only has intermittent joint pain which is improved with occasional dexamethasone and meloxicam. She has no neurological complaints. She denies any recent fevers or illnesses. She has a good appetite. She denies any chest pain or shortness of breath. She has no nausea, vomiting, or constipation. She has no urinary complaints. Patient otherwise feels well and offers no further specific complaints.   REVIEW OF SYSTEMS:   Review of Systems  Constitutional: Negative.  Negative for fever, malaise/fatigue and weight loss.  HENT: Negative.  Negative for ear pain and tinnitus (Began prior to immunotherapy).   Eyes: Negative.   Respiratory: Negative for cough and shortness of breath.   Cardiovascular: Negative.  Negative for chest pain and leg swelling.  Gastrointestinal: Negative for abdominal pain, blood in stool and diarrhea.  Genitourinary: Negative.   Musculoskeletal: Positive for joint pain.  Skin: Negative for itching and rash.  Neurological: Negative.  Negative for sensory change and weakness.  Endo/Heme/Allergies: Does not bruise/bleed easily.  Psychiatric/Behavioral: The patient has insomnia. The patient is not nervous/anxious.     As per HPI. Otherwise, a complete review of systems is negative.  PAST MEDICAL HISTORY: Past Medical History:  Diagnosis Date  . CHF (congestive heart failure)  ([place]) 2016   Dr. Nehemiah Massed  . GERD (gastroesophageal reflux disease) 2012  . Hypothyroidism   . Lumbar herniated disc 2010  . Malignant melanoma of skin of lower limb, including hip ([place]) 2008, 2017   right thigh  . Personal history of malignant melanoma of skin 2012  . Tendonitis   . Tick bite of head 07/20/2015    PAST SURGICAL HISTORY: Past Surgical History:  Procedure Laterality Date  . BACK SURGERY  2010  . BREAST CYST EXCISION  1985  . COLONOSCOPY  07/2012   [facility]  . FRACTURE SURGERY     rod removed from left femur 07/21/2014  . INGUINAL LYMPH NODE BIOPSY Right 07/17/2015   Procedure: INGUINAL LYMPH NODE BIOPSY/EXCISION;  Surgeon: Robert Bellow, MD;  Location: [facility] ORS;  Service: General;  Laterality: Right;  . MELANOMA EXCISION  2008   right thigh  . PORTACATH PLACEMENT Left 03/14/2016   Procedure: INSERTION PORT-A-CATH;  Surgeon: Robert Bellow, MD;  Location: [facility] ORS;  Service: General;  Laterality: Left;  . SKIN LESION EXCISION Right 12-19-10   posterior    FAMILY HISTORY: Family History  Problem Relation Age of Onset  . COPD Father   . Cancer Sister     Colon, sister       ADVANCED DIRECTIVES:    HEALTH MAINTENANCE: Social History  Substance Use Topics  . Smoking status: Current Every Day Smoker    Packs/day: 0.50    Years: 20.00    Types: Cigarettes  . Smokeless tobacco: Never Used  . Alcohol use 0.0 oz/week     Comment: ocassionally     Colonoscopy:  PAP:  Bone density:  Lipid panel:  Allergies  Allergen Reactions  . Clindamycin Shortness Of Breath  . Levaquin [Levofloxacin]  Other (See Comments)    Joint inflammation  . Aripiprazole Other (See Comments)    Facial and head ticks  . Valproic Acid     Other reaction(s): Other (See Comments) Facial twitching    Current Outpatient Prescriptions  Medication Sig Dispense Refill  . aspirin-acetaminophen-caffeine (EXCEDRIN MIGRAINE) 250-250-65 MG tablet Take 2 tablets by mouth daily as  needed for headache.    . calcium carbonate (OS-CAL) 600 MG TABS tablet Take 600 mg by mouth 2 (two) times daily with a meal.    . clonazePAM (KLONOPIN) 0.5 MG tablet Take 0.5 mg by mouth at bedtime.    Marland Kitchen dexamethasone (DECADRON) 4 MG tablet Take 1 tablet (4 mg total) by mouth daily. (Patient taking differently: Take 4 mg by mouth daily. Takes 1 tab on Sun and Thursday.) 30 tablet 1  . doxycycline (VIBRAMYCIN) 100 MG capsule Take 1 capsule (100 mg total) by mouth 2 (two) times daily. 20 capsule 0  . furosemide (LASIX) 20 MG tablet Take 20 mg by mouth every other day.     Marland Kitchen HYDROcodone-acetaminophen (NORCO/VICODIN) 5-325 MG tablet Take 1 tablet by mouth every 4 (four) hours as needed for moderate pain. 60 tablet 0  . hydrocortisone (CORTEF) 10 MG tablet Take 10-15 mg by mouth 2 (two) times daily. 1.5 tablets (15 MG) in the morning and 1 tablet (10 MG) in the evening    . levothyroxine (SYNTHROID, LEVOTHROID) 112 MCG tablet Take 112 mcg by mouth daily before breakfast.     . lidocaine-prilocaine (EMLA) cream Apply 1 application topically as needed. 30 g 3  . meloxicam (MOBIC) 7.5 MG tablet Take 1 tablet (7.5 mg total) by mouth daily. 30 tablet 0  . metoprolol tartrate (LOPRESSOR) 25 MG tablet Take 12.5 mg by mouth 2 (two) times daily.     . Multiple Vitamin (MULTIVITAMIN WITH MINERALS) TABS tablet Take 1 tablet by mouth daily.    . ondansetron (ZOFRAN) 4 MG tablet Take 1 tablet (4 mg total) by mouth every 8 (eight) hours as needed for nausea or vomiting. 30 tablet 2  . pantoprazole (PROTONIX) 40 MG tablet Take 40 mg by mouth 2 (two) times daily.     . potassium chloride SA (K-DUR,KLOR-CON) 20 MEQ tablet Take 1 tablet (20 mEq total) by mouth daily. 30 tablet 1  . venlafaxine XR (EFFEXOR-XR) 150 MG 24 hr capsule 150 mg every morning.     . venlafaxine XR (EFFEXOR-XR) 75 MG 24 hr capsule Take 75 mg by mouth every evening.     No current facility-administered medications for this visit.      OBJECTIVE: Vitals:   05/12/16 0926  BP: 135/84  Pulse: (!) 57  Resp: 18  Temp: 97.9 F (36.6 C)     Body mass index is 27.61 kg/m.    ECOG FS:0 - Asymptomatic  General: Well-developed, well-nourished, no acute distress. Eyes: Pink conjunctiva, anicteric sclera. Lungs: Clear to auscultation bilaterally. Heart: Regular rate and rhythm. No rubs, murmurs, or gallops. Abdomen: Soft, nontender, nondistended. No organomegaly noted, normoactive bowel sounds. Musculoskeletal: No edema, cyanosis, or clubbing. No obvious swelling of joints, no decrease in strength. Mild tenderness to touch. Neuro: Alert, answering all questions appropriately. Cranial nerves grossly intact. Skin: Rash noted on face, ears, and torso consistent with [place] Psych: Normal affect.   LAB RESULTS:  Lab Results  Component Value Date   NA 136 05/12/2016   K 4.0 05/12/2016   CL 101 05/12/2016   CO2 28 05/12/2016   GLUCOSE 98  05/12/2016   BUN 30 (H) 05/12/2016   CREATININE 1.53 (H) 05/12/2016   CALCIUM 8.8 (L) 05/12/2016   PROT 6.7 05/12/2016   ALBUMIN 4.1 05/12/2016   AST 22 05/12/2016   ALT 16 05/12/2016   ALKPHOS 40 05/12/2016   BILITOT 0.7 05/12/2016   GFRNONAA 37 (L) 05/12/2016   GFRAA 43 (L) 05/12/2016    Lab Results  Component Value Date   WBC 10.2 05/12/2016   NEUTROABS 7.6 (H) 05/12/2016   HGB 13.0 05/12/2016   HCT 37.4 05/12/2016   MCV 92.2 05/12/2016   PLT 259 05/12/2016    STUDIES: No results found.  ONCOLOGIC HISTORY: Patient initially diagnosed with metastatic melanoma to right thigh with unknown primary in 2009. She received 4 weeks of high-dose interferon therapy in February 2009 and proceed with low-dose interferon therapy in March 2009. Patient remained in remission until August 2012 when she was noted to have recurrent melanoma and a pelvic lymph node. October 2012 she had 2 cycles of Ipilumimab which was discontinued secondary to poor tolerance. In April 2017 PET scan  showed recurrent disease in the right inguinal region which was confirmed by biopsy. Zeboraf discontinued in November 2017 secondary to intolerable rash. Patient initiated combination immunotherapy with nivolumab and ipilumimab on January 14, 2016. Patient initiated maintenance nivolumab on April 14, 2016.  ASSESSMENT: Recurrent metastatic melanoma, BRAF+, of right lower extremity, metastatic.  PLAN:    1. Recurrent metastatic melanoma, BRAF+, of right lower extremity, metastatic:  PET scan results from April 11, 2016 reviewed Independently with overall improvement of disease burden. Despite decreased hypermetabolic right external iliac lymphadenopathy, patient had new mild hypermetabolic lymphadenopathy in the mediastinum and portacaval space. Previously, patient did not want to attempt another oral medication or oral combination such as dabrafenib/trametinib or cobimetinib. She has completed 4 cycles of 1 mg/kg nivolumab + 3 mg/kg ipilumimab. Proceed with cycle 3 of maintenance treatment with 240 mg flat dose nivolumab every 2 weeks until disease progression or unacceptable toxicity. Return to clinic in 2 weeks for consideration cycle 4 of maintenance nivolumab.  Will re-image with PET scan in approximately May 2018.  2. Pain: Continue Vicodin as needed. Patient states she only takes 4 mg of dexamethasone about twice per week. She occasionally takes is meloxicam as well.  Patient has been educated on the long-term side effects of persistent steroid use, but she wishes to continue current treatment.  3. Nausea: Continue Compazine as needed. 4. Diarrhea: Continue Imodium OTC as needed  Approximately 30 minutes was spent in discussion of which greater than 50% was consultation.  Patient expressed understanding and was in agreement with this plan. She also understands that She can call clinic at any time with any questions, concerns, or complaints.    Lloyd Huger, MD 05/15/16 8:13 AM

## 2016-05-12 ENCOUNTER — Inpatient Hospital Stay (HOSPITAL_BASED_OUTPATIENT_CLINIC_OR_DEPARTMENT_OTHER): Payer: Medicare Other | Admitting: Oncology

## 2016-05-12 ENCOUNTER — Inpatient Hospital Stay: Payer: Medicare Other

## 2016-05-12 ENCOUNTER — Ambulatory Visit: Payer: Medicare Other | Admitting: *Deleted

## 2016-05-12 VITALS — BP 135/84 | HR 57 | Temp 97.9°F | Resp 18 | Wt 176.3 lb

## 2016-05-12 DIAGNOSIS — C4371 Malignant melanoma of right lower limb, including hip: Secondary | ICD-10-CM | POA: Diagnosis not present

## 2016-05-12 DIAGNOSIS — F17210 Nicotine dependence, cigarettes, uncomplicated: Secondary | ICD-10-CM | POA: Diagnosis not present

## 2016-05-12 DIAGNOSIS — I509 Heart failure, unspecified: Secondary | ICD-10-CM | POA: Diagnosis not present

## 2016-05-12 DIAGNOSIS — Z79899 Other long term (current) drug therapy: Secondary | ICD-10-CM | POA: Diagnosis not present

## 2016-05-12 DIAGNOSIS — E039 Hypothyroidism, unspecified: Secondary | ICD-10-CM

## 2016-05-12 DIAGNOSIS — M7989 Other specified soft tissue disorders: Secondary | ICD-10-CM | POA: Diagnosis not present

## 2016-05-12 DIAGNOSIS — C439 Malignant melanoma of skin, unspecified: Secondary | ICD-10-CM

## 2016-05-12 DIAGNOSIS — R590 Localized enlarged lymph nodes: Secondary | ICD-10-CM

## 2016-05-12 DIAGNOSIS — R197 Diarrhea, unspecified: Secondary | ICD-10-CM

## 2016-05-12 DIAGNOSIS — Z5112 Encounter for antineoplastic immunotherapy: Secondary | ICD-10-CM | POA: Diagnosis not present

## 2016-05-12 DIAGNOSIS — K219 Gastro-esophageal reflux disease without esophagitis: Secondary | ICD-10-CM

## 2016-05-12 DIAGNOSIS — C799 Secondary malignant neoplasm of unspecified site: Secondary | ICD-10-CM

## 2016-05-12 DIAGNOSIS — G4700 Insomnia, unspecified: Secondary | ICD-10-CM

## 2016-05-12 DIAGNOSIS — T827XXA Infection and inflammatory reaction due to other cardiac and vascular devices, implants and grafts, initial encounter: Secondary | ICD-10-CM

## 2016-05-12 DIAGNOSIS — R110 Nausea: Secondary | ICD-10-CM | POA: Diagnosis not present

## 2016-05-12 DIAGNOSIS — Z800 Family history of malignant neoplasm of digestive organs: Secondary | ICD-10-CM | POA: Diagnosis not present

## 2016-05-12 LAB — CBC WITH DIFFERENTIAL/PLATELET
Basophils Absolute: 0.1 10*3/uL (ref 0–0.1)
Basophils Relative: 1 %
Eosinophils Absolute: 0.4 10*3/uL (ref 0–0.7)
Eosinophils Relative: 4 %
HCT: 37.4 % (ref 35.0–47.0)
Hemoglobin: 13 g/dL (ref 12.0–16.0)
Lymphocytes Relative: 16 %
Lymphs Abs: 1.6 10*3/uL (ref 1.0–3.6)
MCH: 31.9 pg (ref 26.0–34.0)
MCHC: 34.6 g/dL (ref 32.0–36.0)
MCV: 92.2 fL (ref 80.0–100.0)
Monocytes Absolute: 0.6 10*3/uL (ref 0.2–0.9)
Monocytes Relative: 5 %
Neutro Abs: 7.6 10*3/uL — ABNORMAL HIGH (ref 1.4–6.5)
Neutrophils Relative %: 74 %
Platelets: 259 10*3/uL (ref 150–440)
RBC: 4.06 MIL/uL (ref 3.80–5.20)
RDW: 15.1 % — ABNORMAL HIGH (ref 11.5–14.5)
WBC: 10.2 10*3/uL (ref 3.6–11.0)

## 2016-05-12 LAB — COMPREHENSIVE METABOLIC PANEL
ALT: 16 U/L (ref 14–54)
AST: 22 U/L (ref 15–41)
Albumin: 4.1 g/dL (ref 3.5–5.0)
Alkaline Phosphatase: 40 U/L (ref 38–126)
Anion gap: 7 (ref 5–15)
BUN: 30 mg/dL — ABNORMAL HIGH (ref 6–20)
CO2: 28 mmol/L (ref 22–32)
Calcium: 8.8 mg/dL — ABNORMAL LOW (ref 8.9–10.3)
Chloride: 101 mmol/L (ref 101–111)
Creatinine, Ser: 1.53 mg/dL — ABNORMAL HIGH (ref 0.44–1.00)
GFR calc Af Amer: 43 mL/min — ABNORMAL LOW (ref >60)
GFR calc non Af Amer: 37 mL/min — ABNORMAL LOW (ref >60)
Glucose, Bld: 98 mg/dL (ref 65–99)
Potassium: 4 mmol/L (ref 3.5–5.1)
Sodium: 136 mmol/L (ref 135–145)
Total Bilirubin: 0.7 mg/dL (ref 0.3–1.2)
Total Protein: 6.7 g/dL (ref 6.5–8.1)

## 2016-05-12 MED ORDER — SODIUM CHLORIDE 0.9% FLUSH
10.0000 mL | INTRAVENOUS | Status: DC | PRN
  Administered 2016-05-12: 10 mL
  Filled 2016-05-12: qty 10

## 2016-05-12 MED ORDER — HEPARIN SOD (PORK) LOCK FLUSH 100 UNIT/ML IV SOLN
500.0000 [IU] | Freq: Once | INTRAVENOUS | Status: AC | PRN
  Administered 2016-05-12: 500 [IU]
  Filled 2016-05-12: qty 5

## 2016-05-12 MED ORDER — SODIUM CHLORIDE 0.9 % IV SOLN
Freq: Once | INTRAVENOUS | Status: AC
  Administered 2016-05-12: 10:00:00 via INTRAVENOUS
  Filled 2016-05-12: qty 1000

## 2016-05-12 MED ORDER — SODIUM CHLORIDE 0.9 % IV SOLN
240.0000 mg | Freq: Once | INTRAVENOUS | Status: AC
  Administered 2016-05-12: 240 mg via INTRAVENOUS
  Filled 2016-05-12: qty 20

## 2016-05-12 NOTE — Progress Notes (Signed)
Patient came in today for a wound check.  The wound is clean, with no signs of infection noted. The sutures were removed and steri strips applied.  

## 2016-05-12 NOTE — Progress Notes (Signed)
Creatinine 1.53 today. Proceed with treatment per Dr Grayland Ormond

## 2016-05-12 NOTE — Progress Notes (Signed)
Continues to have joint pain but has not gotten worse since last visit. States joint pain is manageable at this time.

## 2016-05-13 LAB — THYROID PANEL WITH TSH
FREE THYROXINE INDEX: 1.8 (ref 1.2–4.9)
T3 Uptake Ratio: 29 % (ref 24–39)
T4, Total: 6.1 ug/dL (ref 4.5–12.0)
TSH: 0.244 u[IU]/mL — AB (ref 0.450–4.500)

## 2016-05-20 ENCOUNTER — Other Ambulatory Visit: Payer: Self-pay | Admitting: *Deleted

## 2016-05-20 DIAGNOSIS — C439 Malignant melanoma of skin, unspecified: Secondary | ICD-10-CM

## 2016-05-20 DIAGNOSIS — C799 Secondary malignant neoplasm of unspecified site: Secondary | ICD-10-CM

## 2016-05-20 MED ORDER — POTASSIUM CHLORIDE CRYS ER 20 MEQ PO TBCR: 20.0000 meq | EXTENDED_RELEASE_TABLET | Freq: Every day | ORAL | 1 refills | Status: DC

## 2016-05-20 NOTE — Telephone Encounter (Signed)
Comprehensive metabolic panel  Order: [id number]  Status:  Final result  Visible to patient:  Yes (MyChart)  Next appt:  05/26/2016 at 10:00 AM in Oncology ([facility])  Dx:  Malignant melanoma, metastatic ([place])   Ref Range & Units 8d ago  Potassium 3.5 - 5.1 mmol/L 4.0

## 2016-05-24 NOTE — Progress Notes (Signed)
[facility]  [phone] [phone]  ID: [name] OB: [birth year]  MR#: [id number]  FOY#:[id number]  Patient Care Team: Donnie Mesa, MD as PCP - General (Family Medicine) Robert Bellow, MD as Consulting Physician (General Surgery) Forest Gleason, MD (Oncology) Lloyd Huger, MD as Consulting Physician (Oncology)  CHIEF COMPLAINT: Recurrent metastatic melanoma, BRAF+, of right lower extremity, metastatic.  INTERVAL HISTORY: Patient returns to clinic today for further evaluation and consideration of cycle 4 of maintenance nivolumab. She continues to have joint pain which is unchanged. She has no neurological complaints. She denies any recent fevers or illnesses. She has a good appetite. She denies any chest pain or shortness of breath. She has no nausea, vomiting, or constipation. She has no urinary complaints. Patient otherwise feels well and offers no further specific complaints.   REVIEW OF SYSTEMS:   Review of Systems  Constitutional: Negative.  Negative for fever, malaise/fatigue and weight loss.  HENT: Negative.  Negative for ear pain and tinnitus (Began prior to immunotherapy).   Eyes: Negative.   Respiratory: Negative for cough and shortness of breath.   Cardiovascular: Negative.  Negative for chest pain and leg swelling.  Gastrointestinal: Negative for abdominal pain, blood in stool and diarrhea.  Genitourinary: Negative.   Musculoskeletal: Positive for joint pain.  Skin: Negative for itching and rash.  Neurological: Negative.  Negative for sensory change and weakness.  Endo/Heme/Allergies: Does not bruise/bleed easily.  Psychiatric/Behavioral: The patient has insomnia. The patient is not nervous/anxious.     As per HPI. Otherwise, a complete review of systems is negative.  PAST MEDICAL HISTORY: Past Medical History:  Diagnosis Date  . CHF (congestive heart failure) ([place]) 2016   Dr. Nehemiah Massed  . GERD  (gastroesophageal reflux disease) 2012  . Hypothyroidism   . Lumbar herniated disc 2010  . Malignant melanoma of skin of lower limb, including hip ([place]) 2008, 2017   right thigh  . Personal history of malignant melanoma of skin 2012  . Tendonitis   . Tick bite of head 07/20/2015    PAST SURGICAL HISTORY: Past Surgical History:  Procedure Laterality Date  . BACK SURGERY  2010  . BREAST CYST EXCISION  1985  . COLONOSCOPY  07/2012   [facility]  . FRACTURE SURGERY     rod removed from left femur 07/21/2014  . INGUINAL LYMPH NODE BIOPSY Right 07/17/2015   Procedure: INGUINAL LYMPH NODE BIOPSY/EXCISION;  Surgeon: Robert Bellow, MD;  Location: [facility] ORS;  Service: General;  Laterality: Right;  . MELANOMA EXCISION  2008   right thigh  . PORTACATH PLACEMENT Left 03/14/2016   Procedure: INSERTION PORT-A-CATH;  Surgeon: Robert Bellow, MD;  Location: [facility] ORS;  Service: General;  Laterality: Left;  . SKIN LESION EXCISION Right 12-19-10   posterior    FAMILY HISTORY: Family History  Problem Relation Age of Onset  . COPD Father   . Cancer Sister     Colon, sister       ADVANCED DIRECTIVES:    HEALTH MAINTENANCE: Social History  Substance Use Topics  . Smoking status: Current Every Day Smoker    Packs/day: 0.50    Years: 20.00    Types: Cigarettes  . Smokeless tobacco: Never Used  . Alcohol use 0.0 oz/week     Comment: ocassionally     Colonoscopy:  PAP:  Bone density:  Lipid panel:  Allergies  Allergen Reactions  . Clindamycin Shortness Of Breath  . Levaquin [Levofloxacin] Other (See Comments)  Joint inflammation  . Aripiprazole Other (See Comments)    Facial and head ticks  . Valproic Acid     Other reaction(s): Other (See Comments) Facial twitching    Current Outpatient Prescriptions  Medication Sig Dispense Refill  . aspirin-acetaminophen-caffeine (EXCEDRIN MIGRAINE) 250-250-65 MG tablet Take 2 tablets by mouth daily as needed for headache.    . calcium  carbonate (OS-CAL) 600 MG TABS tablet Take 600 mg by mouth 2 (two) times daily with a meal.    . clonazePAM (KLONOPIN) 0.5 MG tablet Take 0.5 mg by mouth at bedtime.    Marland Kitchen dexamethasone (DECADRON) 4 MG tablet Take 1 tablet (4 mg total) by mouth daily. (Patient taking differently: Take 4 mg by mouth daily. Takes 1 tab on Sun and Thursday.) 30 tablet 1  . furosemide (LASIX) 20 MG tablet Take 20 mg by mouth every other day.     Marland Kitchen HYDROcodone-acetaminophen (NORCO/VICODIN) 5-325 MG tablet Take 1 tablet by mouth every 4 (four) hours as needed for moderate pain. 60 tablet 0  . hydrocortisone (CORTEF) 10 MG tablet Take 10-15 mg by mouth 2 (two) times daily. 1.5 tablets (15 MG) in the morning and 1 tablet (10 MG) in the evening    . levothyroxine (SYNTHROID, LEVOTHROID) 112 MCG tablet Take 112 mcg by mouth daily before breakfast.     . lidocaine-prilocaine (EMLA) cream Apply 1 application topically as needed. 30 g 3  . metoprolol tartrate (LOPRESSOR) 25 MG tablet Take 12.5 mg by mouth 2 (two) times daily.     . mometasone (ELOCON) 0.1 % lotion     . Multiple Vitamin (MULTIVITAMIN WITH MINERALS) TABS tablet Take 1 tablet by mouth daily.    . pantoprazole (PROTONIX) 40 MG tablet Take 40 mg by mouth 2 (two) times daily.     . potassium chloride SA (K-DUR,KLOR-CON) 20 MEQ tablet Take 1 tablet (20 mEq total) by mouth daily. 30 tablet 1  . venlafaxine XR (EFFEXOR-XR) 150 MG 24 hr capsule 150 mg every morning.     . venlafaxine XR (EFFEXOR-XR) 75 MG 24 hr capsule Take 75 mg by mouth every evening.    . meloxicam (MOBIC) 7.5 MG tablet Take 1 tablet (7.5 mg total) by mouth daily. (Patient not taking: Reported on 05/26/2016) 30 tablet 0  . ondansetron (ZOFRAN) 4 MG tablet Take 1 tablet (4 mg total) by mouth every 8 (eight) hours as needed for nausea or vomiting. (Patient not taking: Reported on 05/26/2016) 30 tablet 2   No current facility-administered medications for this visit.     OBJECTIVE: Vitals:   05/26/16  1059  BP: (!) 144/89  Pulse: 72  Temp: 98.4 F (36.9 C)     Body mass index is 28.1 kg/m.    ECOG FS:0 - Asymptomatic  General: Well-developed, well-nourished, no acute distress. Eyes: Pink conjunctiva, anicteric sclera. Lungs: Clear to auscultation bilaterally. Heart: Regular rate and rhythm. No rubs, murmurs, or gallops. Abdomen: Soft, nontender, nondistended. No organomegaly noted, normoactive bowel sounds. Musculoskeletal: No edema, cyanosis, or clubbing. Mild swelling of joints, no decrease in strength. Mild tenderness to touch. Neuro: Alert, answering all questions appropriately. Cranial nerves grossly intact. Skin: No rash or petechiae noted. Psych: Normal affect.   LAB RESULTS:  Lab Results  Component Value Date   NA 135 05/26/2016   K 4.0 05/26/2016   CL 104 05/26/2016   CO2 27 05/26/2016   GLUCOSE 127 (H) 05/26/2016   BUN 27 (H) 05/26/2016   CREATININE 1.48 (H) 05/26/2016  CALCIUM 9.2 05/26/2016   PROT 6.8 05/26/2016   ALBUMIN 3.9 05/26/2016   AST 24 05/26/2016   ALT 15 05/26/2016   ALKPHOS 45 05/26/2016   BILITOT 0.5 05/26/2016   GFRNONAA 39 (L) 05/26/2016   GFRAA 45 (L) 05/26/2016    Lab Results  Component Value Date   WBC 8.1 05/26/2016   NEUTROABS 5.9 05/26/2016   HGB 12.5 05/26/2016   HCT 36.4 05/26/2016   MCV 93.1 05/26/2016   PLT 267 05/26/2016    STUDIES: No results found.  ONCOLOGIC HISTORY: Patient initially diagnosed with metastatic melanoma to right thigh with unknown primary in 2009. She received 4 weeks of high-dose interferon therapy in February 2009 and proceed with low-dose interferon therapy in March 2009. Patient remained in remission until August 2012 when she was noted to have recurrent melanoma and a pelvic lymph node. October 2012 she had 2 cycles of Ipilumimab which was discontinued secondary to poor tolerance. In April 2017 PET scan showed recurrent disease in the right inguinal region which was confirmed by biopsy. Zeboraf  discontinued in November 2017 secondary to intolerable rash. Patient initiated combination immunotherapy with nivolumab and ipilumimab on January 14, 2016. Patient initiated maintenance nivolumab on April 14, 2016.  ASSESSMENT: Recurrent metastatic melanoma, BRAF+, of right lower extremity, metastatic.  PLAN:    1. Recurrent metastatic melanoma, BRAF+, of right lower extremity, metastatic:  PET scan results from April 11, 2016 reviewed Independently with overall improvement of disease burden. Despite decreased hypermetabolic right external iliac lymphadenopathy, patient had new mild hypermetabolic lymphadenopathy in the mediastinum and portacaval space. Previously, patient did not want to attempt another oral medication or oral combination such as dabrafenib/trametinib or cobimetinib. She has completed 4 cycles of 1 mg/kg nivolumab + 3 mg/kg ipilumimab. Proceed with cycle 4 of maintenance treatment with 240 mg flat dose nivolumab every 2 weeks until disease progression or unacceptable toxicity. Return to clinic in 2 weeks for consideration cycle 5 of maintenance nivolumab and then in 4 weeks for further evaluation and consideration of cycle 6.  Will re-image with PET scan in approximately May 2018.  2. Pain: Continue Vicodin as needed. Patient states she only takes 4 mg of dexamethasone about twice per week.  Patient has been educated on the long-term side effects of persistent steroid use as well as the possibility that it may decrease the effectiveness of immunotherapy, but she wishes to continue current treatment. Patient was given a referral to rheumatology for further evaluation. 3. Nausea: Continue Compazine as needed. 4. Diarrhea: Continue Imodium OTC as needed  Approximately 30 minutes was spent in discussion of which greater than 50% was consultation.  Patient expressed understanding and was in agreement with this plan. She also understands that She can call clinic at any time with any  questions, concerns, or complaints.    Lloyd Huger, MD 05/27/16 1:39 PM

## 2016-05-26 ENCOUNTER — Inpatient Hospital Stay (HOSPITAL_BASED_OUTPATIENT_CLINIC_OR_DEPARTMENT_OTHER): Payer: Medicare Other | Admitting: Oncology

## 2016-05-26 ENCOUNTER — Inpatient Hospital Stay: Payer: Medicare Other

## 2016-05-26 ENCOUNTER — Encounter: Payer: Self-pay | Admitting: Oncology

## 2016-05-26 VITALS — BP 144/89 | HR 72 | Temp 98.4°F | Wt 179.4 lb

## 2016-05-26 DIAGNOSIS — R590 Localized enlarged lymph nodes: Secondary | ICD-10-CM

## 2016-05-26 DIAGNOSIS — Z85820 Personal history of malignant melanoma of skin: Secondary | ICD-10-CM

## 2016-05-26 DIAGNOSIS — C799 Secondary malignant neoplasm of unspecified site: Secondary | ICD-10-CM

## 2016-05-26 DIAGNOSIS — Z79899 Other long term (current) drug therapy: Secondary | ICD-10-CM | POA: Diagnosis not present

## 2016-05-26 DIAGNOSIS — F17210 Nicotine dependence, cigarettes, uncomplicated: Secondary | ICD-10-CM

## 2016-05-26 DIAGNOSIS — G4700 Insomnia, unspecified: Secondary | ICD-10-CM | POA: Diagnosis not present

## 2016-05-26 DIAGNOSIS — E039 Hypothyroidism, unspecified: Secondary | ICD-10-CM

## 2016-05-26 DIAGNOSIS — K219 Gastro-esophageal reflux disease without esophagitis: Secondary | ICD-10-CM

## 2016-05-26 DIAGNOSIS — M7989 Other specified soft tissue disorders: Secondary | ICD-10-CM | POA: Diagnosis not present

## 2016-05-26 DIAGNOSIS — C4371 Malignant melanoma of right lower limb, including hip: Secondary | ICD-10-CM | POA: Diagnosis not present

## 2016-05-26 DIAGNOSIS — Z800 Family history of malignant neoplasm of digestive organs: Secondary | ICD-10-CM

## 2016-05-26 DIAGNOSIS — R110 Nausea: Secondary | ICD-10-CM | POA: Diagnosis not present

## 2016-05-26 DIAGNOSIS — I509 Heart failure, unspecified: Secondary | ICD-10-CM | POA: Diagnosis not present

## 2016-05-26 DIAGNOSIS — C439 Malignant melanoma of skin, unspecified: Secondary | ICD-10-CM

## 2016-05-26 DIAGNOSIS — Z5112 Encounter for antineoplastic immunotherapy: Secondary | ICD-10-CM | POA: Diagnosis not present

## 2016-05-26 DIAGNOSIS — R197 Diarrhea, unspecified: Secondary | ICD-10-CM | POA: Diagnosis not present

## 2016-05-26 LAB — CBC WITH DIFFERENTIAL/PLATELET
BASOS ABS: 0.1 10*3/uL (ref 0–0.1)
BASOS PCT: 1 %
EOS ABS: 0.4 10*3/uL (ref 0–0.7)
EOS PCT: 4 %
HCT: 36.4 % (ref 35.0–47.0)
Hemoglobin: 12.5 g/dL (ref 12.0–16.0)
LYMPHS PCT: 18 %
Lymphs Abs: 1.4 10*3/uL (ref 1.0–3.6)
MCH: 32 pg (ref 26.0–34.0)
MCHC: 34.4 g/dL (ref 32.0–36.0)
MCV: 93.1 fL (ref 80.0–100.0)
MONO ABS: 0.4 10*3/uL (ref 0.2–0.9)
Monocytes Relative: 5 %
Neutro Abs: 5.9 10*3/uL (ref 1.4–6.5)
Neutrophils Relative %: 72 %
PLATELETS: 267 10*3/uL (ref 150–440)
RBC: 3.91 MIL/uL (ref 3.80–5.20)
RDW: 15.3 % — AB (ref 11.5–14.5)
WBC: 8.1 10*3/uL (ref 3.6–11.0)

## 2016-05-26 LAB — COMPREHENSIVE METABOLIC PANEL
ALBUMIN: 3.9 g/dL (ref 3.5–5.0)
ALK PHOS: 45 U/L (ref 38–126)
ALT: 15 U/L (ref 14–54)
ANION GAP: 4 — AB (ref 5–15)
AST: 24 U/L (ref 15–41)
BUN: 27 mg/dL — ABNORMAL HIGH (ref 6–20)
CALCIUM: 9.2 mg/dL (ref 8.9–10.3)
CHLORIDE: 104 mmol/L (ref 101–111)
CO2: 27 mmol/L (ref 22–32)
CREATININE: 1.48 mg/dL — AB (ref 0.44–1.00)
GFR calc Af Amer: 45 mL/min — ABNORMAL LOW (ref >60)
GFR calc non Af Amer: 39 mL/min — ABNORMAL LOW (ref >60)
GLUCOSE: 127 mg/dL — AB (ref 65–99)
Potassium: 4 mmol/L (ref 3.5–5.1)
SODIUM: 135 mmol/L (ref 135–145)
Total Bilirubin: 0.5 mg/dL (ref 0.3–1.2)
Total Protein: 6.8 g/dL (ref 6.5–8.1)

## 2016-05-26 MED ORDER — SODIUM CHLORIDE 0.9 % IV SOLN
240.0000 mg | Freq: Once | INTRAVENOUS | Status: AC
  Administered 2016-05-26: 240 mg via INTRAVENOUS
  Filled 2016-05-26: qty 20

## 2016-05-26 MED ORDER — HEPARIN SOD (PORK) LOCK FLUSH 100 UNIT/ML IV SOLN
500.0000 [IU] | Freq: Once | INTRAVENOUS | Status: AC
  Administered 2016-05-26: 500 [IU] via INTRAVENOUS
  Filled 2016-05-26: qty 5

## 2016-05-26 MED ORDER — SODIUM CHLORIDE 0.9% FLUSH
10.0000 mL | INTRAVENOUS | Status: DC | PRN
  Administered 2016-05-26: 10 mL via INTRAVENOUS
  Filled 2016-05-26: qty 10

## 2016-05-26 MED ORDER — SODIUM CHLORIDE 0.9 % IV SOLN
Freq: Once | INTRAVENOUS | Status: AC
  Administered 2016-05-26: 11:00:00 via INTRAVENOUS
  Filled 2016-05-26: qty 1000

## 2016-05-30 ENCOUNTER — Other Ambulatory Visit: Payer: Self-pay

## 2016-05-30 DIAGNOSIS — C439 Malignant melanoma of skin, unspecified: Secondary | ICD-10-CM

## 2016-05-30 DIAGNOSIS — C799 Secondary malignant neoplasm of unspecified site: Secondary | ICD-10-CM

## 2016-06-03 DIAGNOSIS — M2550 Pain in unspecified joint: Secondary | ICD-10-CM | POA: Insufficient documentation

## 2016-06-03 DIAGNOSIS — G629 Polyneuropathy, unspecified: Secondary | ICD-10-CM | POA: Insufficient documentation

## 2016-06-03 DIAGNOSIS — G609 Hereditary and idiopathic neuropathy, unspecified: Secondary | ICD-10-CM | POA: Diagnosis not present

## 2016-06-03 DIAGNOSIS — M06041 Rheumatoid arthritis without rheumatoid factor, right hand: Secondary | ICD-10-CM | POA: Diagnosis not present

## 2016-06-03 DIAGNOSIS — N183 Chronic kidney disease, stage 3 (moderate): Secondary | ICD-10-CM | POA: Diagnosis not present

## 2016-06-03 DIAGNOSIS — M06042 Rheumatoid arthritis without rheumatoid factor, left hand: Secondary | ICD-10-CM | POA: Diagnosis not present

## 2016-06-09 ENCOUNTER — Inpatient Hospital Stay: Payer: Medicare Other

## 2016-06-09 ENCOUNTER — Inpatient Hospital Stay: Payer: Medicare Other | Attending: Oncology

## 2016-06-09 DIAGNOSIS — Z800 Family history of malignant neoplasm of digestive organs: Secondary | ICD-10-CM | POA: Insufficient documentation

## 2016-06-09 DIAGNOSIS — R110 Nausea: Secondary | ICD-10-CM | POA: Insufficient documentation

## 2016-06-09 DIAGNOSIS — I509 Heart failure, unspecified: Secondary | ICD-10-CM | POA: Insufficient documentation

## 2016-06-09 DIAGNOSIS — M2550 Pain in unspecified joint: Secondary | ICD-10-CM | POA: Insufficient documentation

## 2016-06-09 DIAGNOSIS — C439 Malignant melanoma of skin, unspecified: Secondary | ICD-10-CM

## 2016-06-09 DIAGNOSIS — K219 Gastro-esophageal reflux disease without esophagitis: Secondary | ICD-10-CM | POA: Insufficient documentation

## 2016-06-09 DIAGNOSIS — Z79899 Other long term (current) drug therapy: Secondary | ICD-10-CM | POA: Insufficient documentation

## 2016-06-09 DIAGNOSIS — Z5111 Encounter for antineoplastic chemotherapy: Secondary | ICD-10-CM | POA: Diagnosis not present

## 2016-06-09 DIAGNOSIS — R197 Diarrhea, unspecified: Secondary | ICD-10-CM | POA: Diagnosis not present

## 2016-06-09 DIAGNOSIS — C4371 Malignant melanoma of right lower limb, including hip: Secondary | ICD-10-CM | POA: Insufficient documentation

## 2016-06-09 DIAGNOSIS — C799 Secondary malignant neoplasm of unspecified site: Secondary | ICD-10-CM

## 2016-06-09 DIAGNOSIS — F17210 Nicotine dependence, cigarettes, uncomplicated: Secondary | ICD-10-CM | POA: Diagnosis not present

## 2016-06-09 DIAGNOSIS — E039 Hypothyroidism, unspecified: Secondary | ICD-10-CM | POA: Insufficient documentation

## 2016-06-09 DIAGNOSIS — E038 Other specified hypothyroidism: Secondary | ICD-10-CM

## 2016-06-09 LAB — CBC WITH DIFFERENTIAL/PLATELET
BASOS ABS: 0 10*3/uL (ref 0–0.1)
Basophils Relative: 0 %
EOS ABS: 0 10*3/uL (ref 0–0.7)
EOS PCT: 0 %
HCT: 35.6 % (ref 35.0–47.0)
Hemoglobin: 12.4 g/dL (ref 12.0–16.0)
Lymphocytes Relative: 5 %
Lymphs Abs: 0.6 10*3/uL — ABNORMAL LOW (ref 1.0–3.6)
MCH: 32.4 pg (ref 26.0–34.0)
MCHC: 34.8 g/dL (ref 32.0–36.0)
MCV: 93 fL (ref 80.0–100.0)
MONO ABS: 0.1 10*3/uL — AB (ref 0.2–0.9)
Monocytes Relative: 1 %
Neutro Abs: 10.5 10*3/uL — ABNORMAL HIGH (ref 1.4–6.5)
Neutrophils Relative %: 94 %
PLATELETS: 259 10*3/uL (ref 150–440)
RBC: 3.83 MIL/uL (ref 3.80–5.20)
RDW: 15 % — AB (ref 11.5–14.5)
WBC: 11.1 10*3/uL — AB (ref 3.6–11.0)

## 2016-06-09 LAB — COMPREHENSIVE METABOLIC PANEL
ALT: 19 U/L (ref 14–54)
AST: 30 U/L (ref 15–41)
Albumin: 4.6 g/dL (ref 3.5–5.0)
Alkaline Phosphatase: 53 U/L (ref 38–126)
Anion gap: 7 (ref 5–15)
BUN: 25 mg/dL — ABNORMAL HIGH (ref 6–20)
CALCIUM: 9.2 mg/dL (ref 8.9–10.3)
CHLORIDE: 104 mmol/L (ref 101–111)
CO2: 24 mmol/L (ref 22–32)
Creatinine, Ser: 1.49 mg/dL — ABNORMAL HIGH (ref 0.44–1.00)
GFR, EST AFRICAN AMERICAN: 45 mL/min — AB (ref >60)
GFR, EST NON AFRICAN AMERICAN: 39 mL/min — AB (ref >60)
Glucose, Bld: 133 mg/dL — ABNORMAL HIGH (ref 65–99)
POTASSIUM: 4.5 mmol/L (ref 3.5–5.1)
SODIUM: 135 mmol/L (ref 135–145)
TOTAL PROTEIN: 7.2 g/dL (ref 6.5–8.1)
Total Bilirubin: 0.7 mg/dL (ref 0.3–1.2)

## 2016-06-09 MED ORDER — HEPARIN SOD (PORK) LOCK FLUSH 100 UNIT/ML IV SOLN
500.0000 [IU] | Freq: Once | INTRAVENOUS | Status: AC | PRN
  Administered 2016-06-09: 500 [IU]
  Filled 2016-06-09: qty 5

## 2016-06-09 MED ORDER — SODIUM CHLORIDE 0.9 % IV SOLN
Freq: Once | INTRAVENOUS | Status: AC
  Administered 2016-06-09: 14:00:00 via INTRAVENOUS
  Filled 2016-06-09: qty 1000

## 2016-06-09 MED ORDER — SODIUM CHLORIDE 0.9 % IV SOLN
240.0000 mg | Freq: Once | INTRAVENOUS | Status: AC
  Administered 2016-06-09: 240 mg via INTRAVENOUS
  Filled 2016-06-09: qty 24

## 2016-06-10 ENCOUNTER — Encounter: Payer: Self-pay | Admitting: Oncology

## 2016-06-10 DIAGNOSIS — S91312A Laceration without foreign body, left foot, initial encounter: Secondary | ICD-10-CM | POA: Diagnosis not present

## 2016-06-10 DIAGNOSIS — E039 Hypothyroidism, unspecified: Secondary | ICD-10-CM | POA: Diagnosis not present

## 2016-06-10 DIAGNOSIS — N183 Chronic kidney disease, stage 3 (moderate): Secondary | ICD-10-CM | POA: Diagnosis not present

## 2016-06-10 DIAGNOSIS — F17210 Nicotine dependence, cigarettes, uncomplicated: Secondary | ICD-10-CM | POA: Diagnosis not present

## 2016-06-10 LAB — THYROID PANEL WITH TSH
FREE THYROXINE INDEX: 2 (ref 1.2–4.9)
T3 UPTAKE RATIO: 26 % (ref 24–39)
T4 TOTAL: 7.5 ug/dL (ref 4.5–12.0)
TSH: 0.075 u[IU]/mL — ABNORMAL LOW (ref 0.450–4.500)

## 2016-06-10 LAB — T3, FREE: T3 FREE: 2.3 pg/mL (ref 2.0–4.4)

## 2016-06-17 DIAGNOSIS — I10 Essential (primary) hypertension: Secondary | ICD-10-CM | POA: Diagnosis not present

## 2016-06-17 DIAGNOSIS — E2749 Other adrenocortical insufficiency: Secondary | ICD-10-CM | POA: Diagnosis not present

## 2016-06-17 DIAGNOSIS — I340 Nonrheumatic mitral (valve) insufficiency: Secondary | ICD-10-CM | POA: Diagnosis not present

## 2016-06-17 DIAGNOSIS — I5022 Chronic systolic (congestive) heart failure: Secondary | ICD-10-CM | POA: Diagnosis not present

## 2016-06-17 DIAGNOSIS — E230 Hypopituitarism: Secondary | ICD-10-CM | POA: Diagnosis not present

## 2016-06-17 DIAGNOSIS — E038 Other specified hypothyroidism: Secondary | ICD-10-CM | POA: Diagnosis not present

## 2016-06-17 DIAGNOSIS — N183 Chronic kidney disease, stage 3 (moderate): Secondary | ICD-10-CM | POA: Diagnosis not present

## 2016-06-22 NOTE — Progress Notes (Signed)
[place]  [phone] [phone]  ID: Patricia Li OB: Jan 04, 1961  MR#: [id number]  LEX#:[id number]  Patient Care Team: Patricia Mesa, MD as PCP - General (Family Medicine) Patricia Bellow, MD as Consulting Physician (General Surgery) Patricia Gleason, MD (Oncology) Patricia Huger, MD as Consulting Physician (Oncology)  CHIEF COMPLAINT: Recurrent metastatic melanoma, BRAF+, of right lower extremity, metastatic.  INTERVAL HISTORY: Patient returns to clinic today for further evaluation and consideration of cycle 6 of maintenance nivolumab. Her joint pain has significantly improved since initiating Plaquenil. She has no neurological complaints. She denies any recent fevers or illnesses. She has a good appetite. She denies any chest pain or shortness of breath. She has no nausea, vomiting, or constipation. She has no urinary complaints. Patient offers no specific complaints today.   REVIEW OF SYSTEMS:   Review of Systems  Constitutional: Negative.  Negative for fever, malaise/fatigue and weight loss.  HENT: Negative.  Negative for ear pain and tinnitus.   Eyes: Negative.   Respiratory: Negative for cough and shortness of breath.   Cardiovascular: Negative.  Negative for chest pain and leg swelling.  Gastrointestinal: Negative for abdominal pain, blood in stool and diarrhea.  Genitourinary: Negative.   Musculoskeletal: Negative.  Negative for joint pain.  Skin: Negative for itching and rash.  Neurological: Negative.  Negative for sensory change and weakness.  Endo/Heme/Allergies: Does not bruise/bleed easily.  Psychiatric/Behavioral: Negative.  The patient is not nervous/anxious and does not have insomnia.     As per HPI. Otherwise, a complete review of systems is negative.  PAST MEDICAL HISTORY: Past Medical History:  Diagnosis Date  . CHF (congestive heart failure) (Patricia Li) 2016   Dr. Nehemiah Li  . GERD (gastroesophageal reflux  disease) 2012  . Hypothyroidism   . Lumbar herniated disc 2010  . Malignant melanoma of skin of lower limb, including hip (Patricia Li) 2008, 2017   right thigh  . Personal history of malignant melanoma of skin 2012  . Tendonitis   . Tick bite of head 07/20/2015    PAST SURGICAL HISTORY: Past Surgical History:  Procedure Laterality Date  . BACK SURGERY  2010  . BREAST CYST EXCISION  1985  . COLONOSCOPY  07/2012   [facility]  . FRACTURE SURGERY     rod removed from left femur 07/21/2014  . INGUINAL LYMPH NODE BIOPSY Right 07/17/2015   Procedure: INGUINAL LYMPH NODE BIOPSY/EXCISION;  Surgeon: Patricia Bellow, MD;  Location: [facility] ORS;  Service: General;  Laterality: Right;  . MELANOMA EXCISION  2008   right thigh  . PORTACATH PLACEMENT Left 03/14/2016   Procedure: INSERTION PORT-A-CATH;  Surgeon: Patricia Bellow, MD;  Location: [facility] ORS;  Service: General;  Laterality: Left;  . SKIN LESION EXCISION Right 12-19-10   posterior    FAMILY HISTORY: Family History  Problem Relation Age of Onset  . COPD Father   . Cancer Sister     Colon, sister       ADVANCED DIRECTIVES:    HEALTH MAINTENANCE: Social History  Substance Use Topics  . Smoking status: Current Every Day Smoker    Packs/day: 0.50    Years: 20.00    Types: Cigarettes  . Smokeless tobacco: Never Used  . Alcohol use 0.0 oz/week     Comment: ocassionally     Colonoscopy:  PAP:  Bone density:  Lipid panel:  Allergies  Allergen Reactions  . Clindamycin Shortness Of Breath  . Levaquin [Levofloxacin] Other (See Comments)    Joint inflammation  .  Aripiprazole Other (See Comments)    Facial and head ticks  . Valproic Acid     Other reaction(s): Other (See Comments) Facial twitching    Current Outpatient Prescriptions  Medication Sig Dispense Refill  . aspirin-acetaminophen-caffeine (EXCEDRIN MIGRAINE) 250-250-65 MG tablet Take 2 tablets by mouth daily as needed for headache.    . calcium carbonate (OS-CAL) 600 MG  TABS tablet Take 600 mg by mouth 2 (two) times daily with a meal.    . clonazePAM (KLONOPIN) 0.5 MG tablet Take 0.5 mg by mouth at bedtime.    Marland Kitchen dexamethasone (DECADRON) 4 MG tablet Take 1 tablet (4 mg total) by mouth daily. (Patient taking differently: Take 4 mg by mouth daily. Takes 1 tab on Sun and Thursday.) 30 tablet 1  . furosemide (LASIX) 20 MG tablet Take 20 mg by mouth every other day.     Marland Kitchen HYDROcodone-acetaminophen (NORCO/VICODIN) 5-325 MG tablet Take 1 tablet by mouth every 4 (four) hours as needed for moderate pain. 60 tablet 0  . hydrocortisone (CORTEF) 10 MG tablet Take 10-15 mg by mouth 2 (two) times daily. 1.5 tablets (15 MG) in the morning and 1 tablet (10 MG) in the evening    . levothyroxine (SYNTHROID, LEVOTHROID) 112 MCG tablet Take 112 mcg by mouth daily before breakfast.     . lidocaine-prilocaine (EMLA) cream Apply 1 application topically as needed. 30 g 3  . metoprolol tartrate (LOPRESSOR) 25 MG tablet Take 12.5 mg by mouth 2 (two) times daily.     . mometasone (ELOCON) 0.1 % lotion     . Multiple Vitamin (MULTIVITAMIN WITH MINERALS) TABS tablet Take 1 tablet by mouth daily.    . pantoprazole (PROTONIX) 40 MG tablet Take 40 mg by mouth 2 (two) times daily.     . potassium chloride SA (K-DUR,KLOR-CON) 20 MEQ tablet Take 1 tablet (20 mEq total) by mouth daily. 30 tablet 1  . venlafaxine XR (EFFEXOR-XR) 150 MG 24 hr capsule 150 mg every morning.     . venlafaxine XR (EFFEXOR-XR) 75 MG 24 hr capsule Take 75 mg by mouth every evening.    . hydroxychloroquine (PLAQUENIL) 200 MG tablet Take 200 mg by mouth daily.    . ondansetron (ZOFRAN) 4 MG tablet Take 1 tablet (4 mg total) by mouth every 8 (eight) hours as needed for nausea or vomiting. (Patient not taking: Reported on 06/23/2016) 30 tablet 2   No current facility-administered medications for this visit.     OBJECTIVE: Vitals:   06/23/16 1333  BP: 109/68  Pulse: (!) 106  Temp: 98.2 F (36.8 C)     Body mass index is  28.28 kg/m.    ECOG FS:0 - Asymptomatic  General: Well-developed, well-nourished, no acute distress. Eyes: Pink conjunctiva, anicteric sclera. Lungs: Clear to auscultation bilaterally. Heart: Regular rate and rhythm. No rubs, murmurs, or gallops. Abdomen: Soft, nontender, nondistended. No organomegaly noted, normoactive bowel sounds. Musculoskeletal: No edema, cyanosis, or clubbing. Mild swelling of joints, no decrease in strength. Mild tenderness to touch. Neuro: Alert, answering all questions appropriately. Cranial nerves grossly intact. Skin: No rash or petechiae noted. Psych: Normal affect.   LAB RESULTS:  Lab Results  Component Value Date   NA 137 06/23/2016   K 3.8 06/23/2016   CL 102 06/23/2016   CO2 28 06/23/2016   GLUCOSE 130 (H) 06/23/2016   BUN 26 (H) 06/23/2016   CREATININE 1.56 (H) 06/23/2016   CALCIUM 9.1 06/23/2016   PROT 7.1 06/23/2016   ALBUMIN 4.3 06/23/2016  AST 23 06/23/2016   ALT 16 06/23/2016   ALKPHOS 52 06/23/2016   BILITOT 0.6 06/23/2016   GFRNONAA 37 (L) 06/23/2016   GFRAA 42 (L) 06/23/2016    Lab Results  Component Value Date   WBC 8.7 06/23/2016   NEUTROABS 5.7 06/23/2016   HGB 12.6 06/23/2016   HCT 35.9 06/23/2016   MCV 93.1 06/23/2016   PLT 279 06/23/2016    STUDIES: No results found.  ONCOLOGIC HISTORY: Patient initially diagnosed with metastatic melanoma to right thigh with unknown primary in 2009. She received 4 weeks of high-dose interferon therapy in February 2009 and proceed with low-dose interferon therapy in March 2009. Patient remained in remission until August 2012 when she was noted to have recurrent melanoma and a pelvic lymph node. October 2012 she had 2 cycles of Ipilumimab which was discontinued secondary to poor tolerance. In April 2017 PET scan showed recurrent disease in the right inguinal region which was confirmed by biopsy. Zeboraf discontinued in November 2017 secondary to intolerable rash. Patient initiated  combination immunotherapy with nivolumab and ipilumimab on January 14, 2016. Patient initiated maintenance nivolumab on April 14, 2016.  ASSESSMENT: Recurrent metastatic melanoma, BRAF+, of right lower extremity, metastatic.  PLAN:    1. Recurrent metastatic melanoma, BRAF+, of right lower extremity, metastatic:  PET scan results from April 11, 2016 reviewed Independently with overall improvement of disease burden. Despite decreased hypermetabolic right external iliac lymphadenopathy, patient had new mild hypermetabolic lymphadenopathy in the mediastinum and portacaval space. Previously, patient did not want to attempt another oral medication or oral combination such as dabrafenib/trametinib or cobimetinib. She has completed 4 cycles of 1 mg/kg nivolumab + 3 mg/kg ipilumimab. Proceed with cycle 6 of maintenance treatment with 240 mg flat dose nivolumab every 2 weeks until disease progression or unacceptable toxicity. Return to clinic in 2 weeks for consideration cycle 7 of maintenance nivolumab. Will reimage with PET scan prior to her next treatment. 2. Pain: Continue Plaquenil as per rheumatology. Patient states she only uses Vicodin sparingly and has essentially discontinued her dexamethasone. 3. Nausea: Continue Compazine as needed. 4. Diarrhea: Continue Imodium OTC as needed  Approximately 30 minutes was spent in discussion of which greater than 50% was consultation.  Patient expressed understanding and was in agreement with this plan. She also understands that She can call clinic at any time with any questions, concerns, or complaints.    Patricia Huger, MD 06/25/16 6:23 PM

## 2016-06-23 ENCOUNTER — Inpatient Hospital Stay: Payer: Medicare Other

## 2016-06-23 ENCOUNTER — Inpatient Hospital Stay (HOSPITAL_BASED_OUTPATIENT_CLINIC_OR_DEPARTMENT_OTHER): Payer: Medicare Other | Admitting: Oncology

## 2016-06-23 VITALS — BP 109/68 | HR 106 | Temp 98.2°F | Wt 180.6 lb

## 2016-06-23 DIAGNOSIS — R110 Nausea: Secondary | ICD-10-CM | POA: Diagnosis not present

## 2016-06-23 DIAGNOSIS — R197 Diarrhea, unspecified: Secondary | ICD-10-CM | POA: Diagnosis not present

## 2016-06-23 DIAGNOSIS — E039 Hypothyroidism, unspecified: Secondary | ICD-10-CM

## 2016-06-23 DIAGNOSIS — C4371 Malignant melanoma of right lower limb, including hip: Secondary | ICD-10-CM | POA: Diagnosis not present

## 2016-06-23 DIAGNOSIS — C799 Secondary malignant neoplasm of unspecified site: Secondary | ICD-10-CM

## 2016-06-23 DIAGNOSIS — Z800 Family history of malignant neoplasm of digestive organs: Secondary | ICD-10-CM

## 2016-06-23 DIAGNOSIS — Z79899 Other long term (current) drug therapy: Secondary | ICD-10-CM | POA: Diagnosis not present

## 2016-06-23 DIAGNOSIS — K219 Gastro-esophageal reflux disease without esophagitis: Secondary | ICD-10-CM | POA: Diagnosis not present

## 2016-06-23 DIAGNOSIS — F17210 Nicotine dependence, cigarettes, uncomplicated: Secondary | ICD-10-CM | POA: Diagnosis not present

## 2016-06-23 DIAGNOSIS — I509 Heart failure, unspecified: Secondary | ICD-10-CM | POA: Diagnosis not present

## 2016-06-23 DIAGNOSIS — C439 Malignant melanoma of skin, unspecified: Secondary | ICD-10-CM

## 2016-06-23 DIAGNOSIS — Z5111 Encounter for antineoplastic chemotherapy: Secondary | ICD-10-CM | POA: Diagnosis not present

## 2016-06-23 DIAGNOSIS — M2550 Pain in unspecified joint: Secondary | ICD-10-CM

## 2016-06-23 LAB — CBC WITH DIFFERENTIAL/PLATELET
Basophils Absolute: 0.1 10*3/uL (ref 0–0.1)
Basophils Relative: 1 %
EOS PCT: 6 %
Eosinophils Absolute: 0.5 10*3/uL (ref 0–0.7)
HCT: 35.9 % (ref 35.0–47.0)
Hemoglobin: 12.6 g/dL (ref 12.0–16.0)
LYMPHS PCT: 21 %
Lymphs Abs: 1.8 10*3/uL (ref 1.0–3.6)
MCH: 32.7 pg (ref 26.0–34.0)
MCHC: 35.1 g/dL (ref 32.0–36.0)
MCV: 93.1 fL (ref 80.0–100.0)
MONOS PCT: 6 %
Monocytes Absolute: 0.5 10*3/uL (ref 0.2–0.9)
Neutro Abs: 5.7 10*3/uL (ref 1.4–6.5)
Neutrophils Relative %: 66 %
PLATELETS: 279 10*3/uL (ref 150–440)
RBC: 3.85 MIL/uL (ref 3.80–5.20)
RDW: 14.3 % (ref 11.5–14.5)
WBC: 8.7 10*3/uL (ref 3.6–11.0)

## 2016-06-23 LAB — COMPREHENSIVE METABOLIC PANEL
ALT: 16 U/L (ref 14–54)
AST: 23 U/L (ref 15–41)
Albumin: 4.3 g/dL (ref 3.5–5.0)
Alkaline Phosphatase: 52 U/L (ref 38–126)
Anion gap: 7 (ref 5–15)
BUN: 26 mg/dL — ABNORMAL HIGH (ref 6–20)
CHLORIDE: 102 mmol/L (ref 101–111)
CO2: 28 mmol/L (ref 22–32)
CREATININE: 1.56 mg/dL — AB (ref 0.44–1.00)
Calcium: 9.1 mg/dL (ref 8.9–10.3)
GFR, EST AFRICAN AMERICAN: 42 mL/min — AB (ref >60)
GFR, EST NON AFRICAN AMERICAN: 37 mL/min — AB (ref >60)
Glucose, Bld: 130 mg/dL — ABNORMAL HIGH (ref 65–99)
POTASSIUM: 3.8 mmol/L (ref 3.5–5.1)
Sodium: 137 mmol/L (ref 135–145)
Total Bilirubin: 0.6 mg/dL (ref 0.3–1.2)
Total Protein: 7.1 g/dL (ref 6.5–8.1)

## 2016-06-23 MED ORDER — HEPARIN SOD (PORK) LOCK FLUSH 100 UNIT/ML IV SOLN
500.0000 [IU] | Freq: Once | INTRAVENOUS | Status: AC | PRN
  Administered 2016-06-23: 500 [IU]

## 2016-06-23 MED ORDER — SODIUM CHLORIDE 0.9 % IV SOLN
240.0000 mg | Freq: Once | INTRAVENOUS | Status: AC
  Administered 2016-06-23: 240 mg via INTRAVENOUS
  Filled 2016-06-23: qty 24

## 2016-06-23 MED ORDER — HEPARIN SOD (PORK) LOCK FLUSH 100 UNIT/ML IV SOLN
INTRAVENOUS | Status: AC
  Filled 2016-06-23: qty 5

## 2016-06-23 MED ORDER — SODIUM CHLORIDE 0.9 % IV SOLN
Freq: Once | INTRAVENOUS | Status: AC
  Administered 2016-06-23: 15:00:00 via INTRAVENOUS
  Filled 2016-06-23: qty 1000

## 2016-06-23 NOTE — Progress Notes (Signed)
Patient here today for follow up.   

## 2016-06-24 DIAGNOSIS — C779 Secondary and unspecified malignant neoplasm of lymph node, unspecified: Secondary | ICD-10-CM | POA: Diagnosis not present

## 2016-06-24 DIAGNOSIS — E230 Hypopituitarism: Secondary | ICD-10-CM | POA: Diagnosis not present

## 2016-06-24 DIAGNOSIS — R635 Abnormal weight gain: Secondary | ICD-10-CM | POA: Diagnosis not present

## 2016-06-24 DIAGNOSIS — E2749 Other adrenocortical insufficiency: Secondary | ICD-10-CM | POA: Diagnosis not present

## 2016-06-24 DIAGNOSIS — E038 Other specified hypothyroidism: Secondary | ICD-10-CM | POA: Diagnosis not present

## 2016-06-24 DIAGNOSIS — Z7952 Long term (current) use of systemic steroids: Secondary | ICD-10-CM | POA: Diagnosis not present

## 2016-06-24 DIAGNOSIS — C439 Malignant melanoma of skin, unspecified: Secondary | ICD-10-CM | POA: Diagnosis not present

## 2016-06-24 DIAGNOSIS — F17200 Nicotine dependence, unspecified, uncomplicated: Secondary | ICD-10-CM | POA: Diagnosis not present

## 2016-07-04 ENCOUNTER — Ambulatory Visit
Admission: RE | Admit: 2016-07-04 | Discharge: 2016-07-04 | Disposition: A | Discharge status: Home / Self Care | Payer: Medicare Other | Source: Ambulatory Visit | Attending: Oncology | Admitting: Oncology

## 2016-07-04 DIAGNOSIS — M899 Disorder of bone, unspecified: Secondary | ICD-10-CM | POA: Insufficient documentation

## 2016-07-04 DIAGNOSIS — C4371 Malignant melanoma of right lower limb, including hip: Secondary | ICD-10-CM | POA: Diagnosis not present

## 2016-07-04 DIAGNOSIS — C439 Malignant melanoma of skin, unspecified: Secondary | ICD-10-CM | POA: Diagnosis not present

## 2016-07-04 LAB — GLUCOSE, CAPILLARY: GLUCOSE-CAPILLARY: 79 mg/dL (ref 65–99)

## 2016-07-04 MED ORDER — FLUDEOXYGLUCOSE F - 18 (FDG) INJECTION
12.0000 | Freq: Once | INTRAVENOUS | Status: AC | PRN
  Administered 2016-07-04: 12.22 via INTRAVENOUS

## 2016-07-06 NOTE — Progress Notes (Signed)
[place]  [phone] [phone]  ID: Patricia Li OB: 08-21-1960  MR#: [id number]  EZM#:[id number]  Patient Care Team: Jene Every, MD as PCP - General (Family Medicine) Bary Castilla, Forest Gleason, MD as Consulting Physician (General Surgery) Forest Gleason, MD (Oncology) Lloyd Huger, MD as Consulting Physician (Oncology)  CHIEF COMPLAINT: Recurrent metastatic melanoma, BRAF+, of right lower extremity, metastatic.  INTERVAL HISTORY: Patient returns to clinic today for further evaluation and consideration of cycle 7 of maintenance nivolumab and discussion of her imaging results. Her joint pain has significantly improved since initiating Plaquenil. She has no neurological complaints. She complains of increased left hip pain.  She denies any recent fevers or illnesses. She has a good appetite. She denies any chest pain or shortness of breath. She has no nausea, vomiting, or constipation. She has no urinary complaints. Patient offers no specific complaints today.   REVIEW OF SYSTEMS:   Review of Systems  Constitutional: Negative.  Negative for fever, malaise/fatigue and weight loss.  HENT: Negative.  Negative for ear pain and tinnitus.   Eyes: Negative.   Respiratory: Negative for cough and shortness of breath.   Cardiovascular: Negative.  Negative for chest pain and leg swelling.  Gastrointestinal: Negative for abdominal pain, blood in stool and diarrhea.  Genitourinary: Negative.   Musculoskeletal: Positive for joint pain.  Skin: Negative for itching and rash.  Neurological: Negative.  Negative for sensory change and weakness.  Endo/Heme/Allergies: Does not bruise/bleed easily.  Psychiatric/Behavioral: Negative.  The patient is not nervous/anxious and does not have insomnia.     As per HPI. Otherwise, a complete review of systems is negative.  PAST MEDICAL HISTORY: Past Medical History:  Diagnosis Date  . CHF (congestive heart  failure) ([place]) 2016   Dr. Nehemiah Massed  . GERD (gastroesophageal reflux disease) 2012  . Hypothyroidism   . Lumbar herniated disc 2010  . Malignant melanoma of skin of lower limb, including hip ([place]) 2008, 2017   right thigh  . Personal history of malignant melanoma of skin 2012  . Tendonitis   . Tick bite of head 07/20/2015    PAST SURGICAL HISTORY: Past Surgical History:  Procedure Laterality Date  . BACK SURGERY  2010  . BREAST CYST EXCISION  1985  . COLONOSCOPY  07/2012   [facility]  . FRACTURE SURGERY     rod removed from left femur 07/21/2014  . INGUINAL LYMPH NODE BIOPSY Right 07/17/2015   Procedure: INGUINAL LYMPH NODE BIOPSY/EXCISION;  Surgeon: Robert Bellow, MD;  Location: [facility] ORS;  Service: General;  Laterality: Right;  . MELANOMA EXCISION  2008   right thigh  . PORTACATH PLACEMENT Left 03/14/2016   Procedure: INSERTION PORT-A-CATH;  Surgeon: Robert Bellow, MD;  Location: [facility] ORS;  Service: General;  Laterality: Left;  . SKIN LESION EXCISION Right 12-19-10   posterior    FAMILY HISTORY: Family History  Problem Relation Age of Onset  . COPD Father   . Cancer Sister        Colon, sister       ADVANCED DIRECTIVES:    HEALTH MAINTENANCE: Social History  Substance Use Topics  . Smoking status: Current Every Day Smoker    Packs/day: 0.50    Years: 20.00    Types: Cigarettes  . Smokeless tobacco: Never Used  . Alcohol use 0.0 oz/week     Comment: ocassionally     Colonoscopy:  PAP:  Bone density:  Lipid panel:  Allergies  Allergen Reactions  . Clindamycin  Shortness Of Breath  . Levaquin [Levofloxacin] Other (See Comments)    Joint inflammation  . Aripiprazole Other (See Comments)    Facial and head ticks  . Valproic Acid     Other reaction(s): Other (See Comments) Facial twitching    Current Outpatient Prescriptions  Medication Sig Dispense Refill  . aspirin-acetaminophen-caffeine (EXCEDRIN MIGRAINE) 250-250-65 MG tablet Take 2 tablets by  mouth daily as needed for headache.    . calcium carbonate (OS-CAL) 600 MG TABS tablet Take 600 mg by mouth 2 (two) times daily with a meal.    . clonazePAM (KLONOPIN) 0.5 MG tablet Take 0.5 mg by mouth at bedtime.    . furosemide (LASIX) 20 MG tablet Take 20 mg by mouth every other day.     . gabapentin (NEURONTIN) 300 MG capsule Take 300 mg by mouth 3 (three) times daily.    Marland Kitchen HYDROcodone-acetaminophen (NORCO/VICODIN) 5-325 MG tablet Take 1 tablet by mouth every 4 (four) hours as needed for moderate pain. 60 tablet 0  . hydrocortisone (CORTEF) 10 MG tablet Take 10-15 mg by mouth 2 (two) times daily. 1.5 tablets (15 MG) in the morning and 1 tablet (10 MG) in the evening    . hydroxychloroquine (PLAQUENIL) 200 MG tablet Take 200 mg by mouth daily.    Marland Kitchen levothyroxine (SYNTHROID, LEVOTHROID) 112 MCG tablet Take 112 mcg by mouth daily before breakfast.     . lidocaine-prilocaine (EMLA) cream Apply 1 application topically as needed. 30 g 3  . metoprolol tartrate (LOPRESSOR) 25 MG tablet Take 12.5 mg by mouth 2 (two) times daily.     . Multiple Vitamin (MULTIVITAMIN WITH MINERALS) TABS tablet Take 1 tablet by mouth daily.    . ondansetron (ZOFRAN) 4 MG tablet Take 1 tablet (4 mg total) by mouth every 8 (eight) hours as needed for nausea or vomiting. 30 tablet 2  . pantoprazole (PROTONIX) 40 MG tablet Take 40 mg by mouth 2 (two) times daily.     . potassium chloride SA (K-DUR,KLOR-CON) 20 MEQ tablet Take 1 tablet (20 mEq total) by mouth daily. 30 tablet 1  . predniSONE (DELTASONE) 10 MG tablet Take 10 mg by mouth daily with breakfast.    . venlafaxine XR (EFFEXOR-XR) 150 MG 24 hr capsule 150 mg every morning.     . venlafaxine XR (EFFEXOR-XR) 75 MG 24 hr capsule Take 75 mg by mouth every evening.    Marland Kitchen dexamethasone (DECADRON) 4 MG tablet Take 1 tablet (4 mg total) by mouth daily. (Patient not taking: Reported on 07/07/2016) 30 tablet 1  . mometasone (ELOCON) 0.1 % lotion      No current  facility-administered medications for this visit.     OBJECTIVE: Vitals:   07/07/16 1052  BP: 133/89  Pulse: 73  Resp: 20  Temp: 97 F (36.1 C)     Body mass index is 28.19 kg/m.    ECOG FS:0 - Asymptomatic  General: Well-developed, well-nourished, no acute distress. Eyes: Pink conjunctiva, anicteric sclera. Lungs: Clear to auscultation bilaterally. Heart: Regular rate and rhythm. No rubs, murmurs, or gallops. Abdomen: Soft, nontender, nondistended. No organomegaly noted, normoactive bowel sounds. Musculoskeletal: No edema, cyanosis, or clubbing. Mild swelling of joints, no decrease in strength. Mild tenderness to touch. Neuro: Alert, answering all questions appropriately. Cranial nerves grossly intact. Skin: No rash or petechiae noted. Psych: Normal affect.   LAB RESULTS:  Lab Results  Component Value Date   NA 136 07/07/2016   K 4.1 07/07/2016   CL 106 07/07/2016  CO2 26 07/07/2016   GLUCOSE 87 07/07/2016   BUN 21 (H) 07/07/2016   CREATININE 1.54 (H) 07/07/2016   CALCIUM 8.9 07/07/2016   PROT 6.8 07/07/2016   ALBUMIN 4.0 07/07/2016   AST 25 07/07/2016   ALT 15 07/07/2016   ALKPHOS 59 07/07/2016   BILITOT 0.6 07/07/2016   GFRNONAA 37 (L) 07/07/2016   GFRAA 43 (L) 07/07/2016    Lab Results  Component Value Date   WBC 7.7 07/07/2016   NEUTROABS 5.3 07/07/2016   HGB 11.8 (L) 07/07/2016   HCT 34.0 (L) 07/07/2016   MCV 94.9 07/07/2016   PLT 291 07/07/2016    STUDIES: Nm Pet Image Restage (ps) Whole Body  Result Date: 07/04/2016 CLINICAL DATA:  Subsequent treatment strategy for melanoma. EXAM: NUCLEAR MEDICINE PET WHOLE BODY TECHNIQUE: 12.2 mCi F-18 FDG was injected intravenously. Full-ring PET imaging was performed from the vertex to the feet after the radiotracer. CT data was obtained and used for attenuation correction and anatomic localization. FASTING BLOOD GLUCOSE:  Value: 79 mg/dl COMPARISON:  04/11/2016 FINDINGS: HEAD/NECK No hypermetabolic activity in  the scalp. No hypermetabolic cervical lymph nodes. CHEST Index left paratracheal lymph node measures 8 mm and has an SUV max equal to 2.85. Previously this measured 11 mm and had an SUV max equal to 3.3. Index right paratracheal lymph node measures 1 cm and has an SUV max equal to 2.29. Previously this measured 11 mm and had an SUV max equal to 3.5. Index sub- carinal lymph node measures 7 mm and has an SUV max equal to 2.84. Previously 10 mm within SUV max equal to 4.0. No suspicious pulmonary nodules on the CT scan. ABDOMEN/PELVIS Resolution of previous hypermetabolism associated with upper abdominal portacaval node. SUV max equals 2.6, previously 4.9. Index right external iliac lymph node measures 1.5 cm with an SUV max equal to 2.77. Previously 1.6 cm with an SUV max equal to 4.2. No abnormal uptake identified within the liver, pancreas, spleen or adrenal glands. SKELETON There is a new hypermetabolic lesion involving the left sacral wing measuring approximately 2.8 cm with an SUV max equal to 6.5. New healing fracture involving the posterior aspect of the left eleventh rib with associated increased uptake, likely posttraumatic. EXTREMITIES No abnormal hypermetabolic activity in the lower extremities. IMPRESSION: 1. Mixed interval response to therapy. There is been interval decrease in size and degree of FDG uptake associated with mediastinal, upper abdominal and right pelvic lymph nodes. 2. There is a new hypermetabolic lesion identified within the left sacral wing worrisome for bone metastases. Electronically Signed   By: Kerby Moors M.D.   On: 07/04/2016 10:23    ONCOLOGIC HISTORY: Patient initially diagnosed with metastatic melanoma to right thigh with unknown primary in 2009. She received 4 weeks of high-dose interferon therapy in February 2009 and proceed with low-dose interferon therapy in March 2009. Patient remained in remission until August 2012 when she was noted to have recurrent melanoma and a  pelvic lymph node. October 2012 she had 2 cycles of Ipilumimab which was discontinued secondary to poor tolerance. In April 2017 PET scan showed recurrent disease in the right inguinal region which was confirmed by biopsy. Zeboraf discontinued in November 2017 secondary to intolerable rash. Patient initiated combination immunotherapy with nivolumab and ipilumimab on January 14, 2016. Patient initiated maintenance nivolumab on April 14, 2016.  ASSESSMENT: Recurrent metastatic melanoma, BRAF+, of right lower extremity, metastatic.  PLAN:    1. Recurrent metastatic melanoma, BRAF+, of right lower extremity, metastatic:  PET scan results from Jul 06, 2016 reviewed independently with overall improvement of disease burden. She does have a new iliac crest lesion that may be contributing to her pain. A referral to radiation oncology for consideration of palliative XRT was given. Previously, patient did not want to attempt another oral medication or oral combination such as dabrafenib/trametinib or cobimetinib. She has completed 4 cycles of 1 mg/kg nivolumab + 3 mg/kg ipilumimab. Proceed with cycle 7 of maintenance treatment with 240 mg flat dose nivolumab every 2 weeks until disease progression or unacceptable toxicity. Return to clinic in 2 weeks for consideration cycle 8 of maintenance nivolumab and then in 4 weeks for further evaluation and consideration of cycle 9. Will reimage with PET scan in August 2018 2. Pain: Continue Plaquenil and treatment as per rheumatology. Referral to radiation oncology for left hip pain.  3. Nausea: Continue Compazine as needed. 4. Diarrhea: Continue Imodium OTC as needed  Approximately 30 minutes was spent in discussion of which greater than 50% was consultation.  Patient expressed understanding and was in agreement with this plan. She also understands that She can call clinic at any time with any questions, concerns, or complaints.    Lloyd Huger, MD 07/07/16  4:36 PM

## 2016-07-07 ENCOUNTER — Inpatient Hospital Stay (HOSPITAL_BASED_OUTPATIENT_CLINIC_OR_DEPARTMENT_OTHER): Payer: Medicare Other | Admitting: Oncology

## 2016-07-07 ENCOUNTER — Inpatient Hospital Stay: Payer: Medicare Other | Attending: Oncology

## 2016-07-07 ENCOUNTER — Inpatient Hospital Stay: Payer: Medicare Other

## 2016-07-07 ENCOUNTER — Ambulatory Visit
Admission: RE | Admit: 2016-07-07 | Discharge: 2016-07-07 | Disposition: A | Discharge status: Home / Self Care | Payer: Medicare Other | Source: Ambulatory Visit | Attending: Radiation Oncology | Admitting: Radiation Oncology

## 2016-07-07 VITALS — BP 133/89 | HR 73 | Temp 97.0°F | Resp 20 | Wt 180.0 lb

## 2016-07-07 DIAGNOSIS — Z85820 Personal history of malignant melanoma of skin: Secondary | ICD-10-CM

## 2016-07-07 DIAGNOSIS — C439 Malignant melanoma of skin, unspecified: Secondary | ICD-10-CM

## 2016-07-07 DIAGNOSIS — Z79899 Other long term (current) drug therapy: Secondary | ICD-10-CM | POA: Diagnosis not present

## 2016-07-07 DIAGNOSIS — Z800 Family history of malignant neoplasm of digestive organs: Secondary | ICD-10-CM

## 2016-07-07 DIAGNOSIS — F17210 Nicotine dependence, cigarettes, uncomplicated: Secondary | ICD-10-CM | POA: Insufficient documentation

## 2016-07-07 DIAGNOSIS — Z510 Encounter for antineoplastic radiation therapy: Secondary | ICD-10-CM | POA: Insufficient documentation

## 2016-07-07 DIAGNOSIS — E039 Hypothyroidism, unspecified: Secondary | ICD-10-CM | POA: Insufficient documentation

## 2016-07-07 DIAGNOSIS — M25552 Pain in left hip: Secondary | ICD-10-CM

## 2016-07-07 DIAGNOSIS — I509 Heart failure, unspecified: Secondary | ICD-10-CM

## 2016-07-07 DIAGNOSIS — C799 Secondary malignant neoplasm of unspecified site: Secondary | ICD-10-CM

## 2016-07-07 DIAGNOSIS — K219 Gastro-esophageal reflux disease without esophagitis: Secondary | ICD-10-CM | POA: Diagnosis not present

## 2016-07-07 DIAGNOSIS — R197 Diarrhea, unspecified: Secondary | ICD-10-CM | POA: Diagnosis not present

## 2016-07-07 DIAGNOSIS — C4371 Malignant melanoma of right lower limb, including hip: Secondary | ICD-10-CM | POA: Diagnosis not present

## 2016-07-07 DIAGNOSIS — C7951 Secondary malignant neoplasm of bone: Secondary | ICD-10-CM

## 2016-07-07 DIAGNOSIS — Z923 Personal history of irradiation: Secondary | ICD-10-CM | POA: Insufficient documentation

## 2016-07-07 DIAGNOSIS — R110 Nausea: Secondary | ICD-10-CM

## 2016-07-07 LAB — COMPREHENSIVE METABOLIC PANEL
ALT: 15 U/L (ref 14–54)
ANION GAP: 4 — AB (ref 5–15)
AST: 25 U/L (ref 15–41)
Albumin: 4 g/dL (ref 3.5–5.0)
Alkaline Phosphatase: 59 U/L (ref 38–126)
BILIRUBIN TOTAL: 0.6 mg/dL (ref 0.3–1.2)
BUN: 21 mg/dL — ABNORMAL HIGH (ref 6–20)
CHLORIDE: 106 mmol/L (ref 101–111)
CO2: 26 mmol/L (ref 22–32)
Calcium: 8.9 mg/dL (ref 8.9–10.3)
Creatinine, Ser: 1.54 mg/dL — ABNORMAL HIGH (ref 0.44–1.00)
GFR calc Af Amer: 43 mL/min — ABNORMAL LOW (ref >60)
GFR, EST NON AFRICAN AMERICAN: 37 mL/min — AB (ref >60)
Glucose, Bld: 87 mg/dL (ref 65–99)
POTASSIUM: 4.1 mmol/L (ref 3.5–5.1)
Sodium: 136 mmol/L (ref 135–145)
TOTAL PROTEIN: 6.8 g/dL (ref 6.5–8.1)

## 2016-07-07 LAB — CBC WITH DIFFERENTIAL/PLATELET
BASOS ABS: 0.1 10*3/uL (ref 0–0.1)
Basophils Relative: 1 %
EOS PCT: 9 %
Eosinophils Absolute: 0.7 10*3/uL (ref 0–0.7)
HEMATOCRIT: 34 % — AB (ref 35.0–47.0)
Hemoglobin: 11.8 g/dL — ABNORMAL LOW (ref 12.0–16.0)
Lymphocytes Relative: 16 %
Lymphs Abs: 1.2 10*3/uL (ref 1.0–3.6)
MCH: 32.9 pg (ref 26.0–34.0)
MCHC: 34.7 g/dL (ref 32.0–36.0)
MCV: 94.9 fL (ref 80.0–100.0)
MONO ABS: 0.4 10*3/uL (ref 0.2–0.9)
Monocytes Relative: 5 %
NEUTROS ABS: 5.3 10*3/uL (ref 1.4–6.5)
Neutrophils Relative %: 69 %
PLATELETS: 291 10*3/uL (ref 150–440)
RBC: 3.58 MIL/uL — ABNORMAL LOW (ref 3.80–5.20)
RDW: 13.4 % (ref 11.5–14.5)
WBC: 7.7 10*3/uL (ref 3.6–11.0)

## 2016-07-07 MED ORDER — SODIUM CHLORIDE 0.9 % IV SOLN
240.0000 mg | Freq: Once | INTRAVENOUS | Status: AC
  Administered 2016-07-07: 240 mg via INTRAVENOUS
  Filled 2016-07-07: qty 24

## 2016-07-07 MED ORDER — SODIUM CHLORIDE 0.9 % IV SOLN
Freq: Once | INTRAVENOUS | Status: AC
  Administered 2016-07-07: 12:00:00 via INTRAVENOUS
  Filled 2016-07-07: qty 1000

## 2016-07-07 MED ORDER — HEPARIN SOD (PORK) LOCK FLUSH 100 UNIT/ML IV SOLN
500.0000 [IU] | Freq: Once | INTRAVENOUS | Status: AC | PRN
  Administered 2016-07-07: 500 [IU]

## 2016-07-07 MED ORDER — HEPARIN SOD (PORK) LOCK FLUSH 100 UNIT/ML IV SOLN
INTRAVENOUS | Status: AC
  Filled 2016-07-07: qty 5

## 2016-07-07 NOTE — Progress Notes (Signed)
Radiation Oncology Follow up Note   old patient new area  Name: Patricia Li   Date:   07/07/2016 MRN:  [id number] DOB: 02/28/1961    This 56 y.o. female presents to the clinic today for metastatic lesion to the sacrum in 56 year old female status post adjuvant radiation therapy to her right groin for malignant melanoma.  REFERRING PROVIDER: Jene Every, MD  HPI: Patient is a 56 year old female well-known to department having been treated back in December 2017 to her right groin. She developed a melanoma to her right thigh from an unknown primary site has systemic treatment although developed recurrent melanoma in the right thigh and pelvic nodes in August 2012.. She receives systemic therapy at that time although developed local regional recurrence in the right inguinal region in 2017 underwent excisional biopsy biopsy positive for metastatic malignant melanoma. We did adjuvant radiation therapy to that region. She has been on maintenance nivolumab. Which is tolerated well recent she's complaining of left leg and lower back pain. She underwent a repeat PET CT scan showing a mixed interval response to therapy with decrease in hypermetabolic activity in the mediastinal upper abdominal right pelvic lymph nodes. There was however a new hypermetabolic lesion identified in the left sacral wing worrisome for metastatic disease. She is ambulating with a limp. She does take pain medication at this time. She has no focal sensory or motor loss in her lower extremities. I been asked to evaluate her for possible palliative radiation therapy to this area.  COMPLICATIONS OF TREATMENT: none  FOLLOW UP COMPLIANCE: keeps appointments   PHYSICAL EXAM:  There were no vitals taken for this visit. Range of motion of her lower extremities elicits some pain on the right. Motor sensory and DTR levels are equal and symmetric in the lower extremities. Proprioception is intact. Well-developed well-nourished patient in  NAD. HEENT reveals PERLA, EOMI, discs not visualized.  Oral cavity is clear. No oral mucosal lesions are identified. Neck is clear without evidence of cervical or supraclavicular adenopathy. Lungs are clear to A&P. Cardiac examination is essentially unremarkable with regular rate and rhythm without murmur rub or thrill. Abdomen is benign with no organomegaly or masses noted. Motor sensory and DTR levels are equal and symmetric in the upper and lower extremities. Cranial nerves II through XII are grossly intact. Proprioception is intact. No peripheral adenopathy or edema is identified. No motor or sensory levels are noted. Crude visual fields are within normal range.  RADIOLOGY RESULTS: PET scan is reviewed and compatible with the above-stated findings  PLAN: At this time I to go ahead with palliative radiation therapy to her sacrum. Would plan on delivering 3000 cGy in 10 fractions. Risks and benefits of treatment including possible diarrhea possible increased lower urinary tract symptoms fatigue alteration of blood counts skin reaction all were described in detail to the patient. She seems to comprehend my treatment plan well. I personally ordered and scheduled CT simulation for early next week.  I would like to take this opportunity to thank you for allowing me to participate in the care of your patient.Armstead Peaks., MD

## 2016-07-07 NOTE — Progress Notes (Signed)
Creatinine: 1.54. MD, Dr. Grayland Ormond, notified via telephone and already aware. Per MD order: proceed with scheduled treatment today.

## 2016-07-11 ENCOUNTER — Other Ambulatory Visit: Payer: Self-pay

## 2016-07-11 DIAGNOSIS — Z1231 Encounter for screening mammogram for malignant neoplasm of breast: Secondary | ICD-10-CM

## 2016-07-11 DIAGNOSIS — K625 Hemorrhage of anus and rectum: Secondary | ICD-10-CM

## 2016-07-13 ENCOUNTER — Ambulatory Visit
Admission: RE | Admit: 2016-07-13 | Discharge: 2016-07-13 | Disposition: A | Discharge status: Home / Self Care | Payer: Medicare Other | Source: Ambulatory Visit | Attending: Radiation Oncology | Admitting: Radiation Oncology

## 2016-07-13 DIAGNOSIS — Z923 Personal history of irradiation: Secondary | ICD-10-CM | POA: Diagnosis not present

## 2016-07-13 DIAGNOSIS — C439 Malignant melanoma of skin, unspecified: Secondary | ICD-10-CM | POA: Diagnosis not present

## 2016-07-13 DIAGNOSIS — Z510 Encounter for antineoplastic radiation therapy: Secondary | ICD-10-CM | POA: Diagnosis not present

## 2016-07-13 DIAGNOSIS — C7951 Secondary malignant neoplasm of bone: Secondary | ICD-10-CM | POA: Diagnosis not present

## 2016-07-14 DIAGNOSIS — Z923 Personal history of irradiation: Secondary | ICD-10-CM | POA: Diagnosis not present

## 2016-07-14 DIAGNOSIS — Z510 Encounter for antineoplastic radiation therapy: Secondary | ICD-10-CM | POA: Diagnosis not present

## 2016-07-14 DIAGNOSIS — C7951 Secondary malignant neoplasm of bone: Secondary | ICD-10-CM | POA: Diagnosis not present

## 2016-07-14 DIAGNOSIS — C439 Malignant melanoma of skin, unspecified: Secondary | ICD-10-CM | POA: Diagnosis not present

## 2016-07-15 DIAGNOSIS — H02403 Unspecified ptosis of bilateral eyelids: Secondary | ICD-10-CM | POA: Diagnosis not present

## 2016-07-15 DIAGNOSIS — M05741 Rheumatoid arthritis with rheumatoid factor of right hand without organ or systems involvement: Secondary | ICD-10-CM | POA: Diagnosis not present

## 2016-07-15 DIAGNOSIS — Z79899 Other long term (current) drug therapy: Secondary | ICD-10-CM | POA: Diagnosis not present

## 2016-07-20 ENCOUNTER — Ambulatory Visit
Admission: RE | Admit: 2016-07-20 | Discharge: 2016-07-20 | Disposition: A | Discharge status: Home / Self Care | Payer: Medicare Other | Source: Ambulatory Visit | Attending: Radiation Oncology | Admitting: Radiation Oncology

## 2016-07-20 DIAGNOSIS — C439 Malignant melanoma of skin, unspecified: Secondary | ICD-10-CM | POA: Diagnosis not present

## 2016-07-20 DIAGNOSIS — C7951 Secondary malignant neoplasm of bone: Secondary | ICD-10-CM | POA: Diagnosis not present

## 2016-07-20 DIAGNOSIS — Z923 Personal history of irradiation: Secondary | ICD-10-CM | POA: Diagnosis not present

## 2016-07-20 DIAGNOSIS — Z510 Encounter for antineoplastic radiation therapy: Secondary | ICD-10-CM | POA: Diagnosis not present

## 2016-07-21 ENCOUNTER — Ambulatory Visit
Admission: RE | Admit: 2016-07-21 | Discharge: 2016-07-21 | Disposition: A | Discharge status: Home / Self Care | Payer: Medicare Other | Source: Ambulatory Visit | Attending: Radiation Oncology | Admitting: Radiation Oncology

## 2016-07-21 ENCOUNTER — Inpatient Hospital Stay: Payer: Medicare Other

## 2016-07-21 ENCOUNTER — Ambulatory Visit: Payer: Medicare Other

## 2016-07-21 ENCOUNTER — Other Ambulatory Visit: Payer: Medicare Other

## 2016-07-21 DIAGNOSIS — Z510 Encounter for antineoplastic radiation therapy: Secondary | ICD-10-CM | POA: Diagnosis not present

## 2016-07-21 DIAGNOSIS — R197 Diarrhea, unspecified: Secondary | ICD-10-CM | POA: Diagnosis not present

## 2016-07-21 DIAGNOSIS — Z923 Personal history of irradiation: Secondary | ICD-10-CM | POA: Diagnosis not present

## 2016-07-21 DIAGNOSIS — I509 Heart failure, unspecified: Secondary | ICD-10-CM | POA: Diagnosis not present

## 2016-07-21 DIAGNOSIS — C799 Secondary malignant neoplasm of unspecified site: Secondary | ICD-10-CM

## 2016-07-21 DIAGNOSIS — C439 Malignant melanoma of skin, unspecified: Secondary | ICD-10-CM | POA: Diagnosis not present

## 2016-07-21 DIAGNOSIS — K219 Gastro-esophageal reflux disease without esophagitis: Secondary | ICD-10-CM | POA: Diagnosis not present

## 2016-07-21 DIAGNOSIS — R110 Nausea: Secondary | ICD-10-CM | POA: Diagnosis not present

## 2016-07-21 DIAGNOSIS — C7951 Secondary malignant neoplasm of bone: Secondary | ICD-10-CM | POA: Diagnosis not present

## 2016-07-21 DIAGNOSIS — M25552 Pain in left hip: Secondary | ICD-10-CM | POA: Diagnosis not present

## 2016-07-21 DIAGNOSIS — C4371 Malignant melanoma of right lower limb, including hip: Secondary | ICD-10-CM | POA: Diagnosis not present

## 2016-07-21 LAB — COMPREHENSIVE METABOLIC PANEL
ALT: 18 U/L (ref 14–54)
AST: 28 U/L (ref 15–41)
Albumin: 3.7 g/dL (ref 3.5–5.0)
Alkaline Phosphatase: 84 U/L (ref 38–126)
Anion gap: 4 — ABNORMAL LOW (ref 5–15)
BILIRUBIN TOTAL: 0.5 mg/dL (ref 0.3–1.2)
BUN: 22 mg/dL — AB (ref 6–20)
CHLORIDE: 104 mmol/L (ref 101–111)
CO2: 26 mmol/L (ref 22–32)
Calcium: 8.6 mg/dL — ABNORMAL LOW (ref 8.9–10.3)
Creatinine, Ser: 1.86 mg/dL — ABNORMAL HIGH (ref 0.44–1.00)
GFR calc non Af Amer: 30 mL/min — ABNORMAL LOW (ref >60)
GFR, EST AFRICAN AMERICAN: 34 mL/min — AB (ref >60)
Glucose, Bld: 112 mg/dL — ABNORMAL HIGH (ref 65–99)
POTASSIUM: 3.5 mmol/L (ref 3.5–5.1)
Sodium: 134 mmol/L — ABNORMAL LOW (ref 135–145)
TOTAL PROTEIN: 6.8 g/dL (ref 6.5–8.1)

## 2016-07-21 LAB — CBC WITH DIFFERENTIAL/PLATELET
BASOS ABS: 0.1 10*3/uL (ref 0–0.1)
Basophils Relative: 1 %
Eosinophils Absolute: 0.9 10*3/uL — ABNORMAL HIGH (ref 0–0.7)
Eosinophils Relative: 11 %
HEMATOCRIT: 33.8 % — AB (ref 35.0–47.0)
Hemoglobin: 11.7 g/dL — ABNORMAL LOW (ref 12.0–16.0)
LYMPHS ABS: 1.1 10*3/uL (ref 1.0–3.6)
LYMPHS PCT: 12 %
MCH: 32.4 pg (ref 26.0–34.0)
MCHC: 34.7 g/dL (ref 32.0–36.0)
MCV: 93.6 fL (ref 80.0–100.0)
MONO ABS: 0.4 10*3/uL (ref 0.2–0.9)
MONOS PCT: 5 %
NEUTROS ABS: 6.5 10*3/uL (ref 1.4–6.5)
Neutrophils Relative %: 71 %
PLATELETS: 328 10*3/uL (ref 150–440)
RBC: 3.61 MIL/uL — ABNORMAL LOW (ref 3.80–5.20)
RDW: 12.6 % (ref 11.5–14.5)
WBC: 9.1 10*3/uL (ref 3.6–11.0)

## 2016-07-21 MED ORDER — SODIUM CHLORIDE 0.9% FLUSH
10.0000 mL | INTRAVENOUS | Status: DC | PRN
  Administered 2016-07-21: 10 mL via INTRAVENOUS
  Filled 2016-07-21: qty 10

## 2016-07-21 MED ORDER — HEPARIN SOD (PORK) LOCK FLUSH 100 UNIT/ML IV SOLN
500.0000 [IU] | Freq: Once | INTRAVENOUS | Status: AC
  Administered 2016-07-21: 500 [IU] via INTRAVENOUS
  Filled 2016-07-21: qty 5

## 2016-07-21 NOTE — Progress Notes (Signed)
No treatment today per Dr. Mike Gip, after reviewing labs and lab parameters.

## 2016-07-22 ENCOUNTER — Ambulatory Visit
Admission: RE | Admit: 2016-07-22 | Discharge: 2016-07-22 | Disposition: A | Discharge status: Home / Self Care | Payer: Medicare Other | Source: Ambulatory Visit | Attending: Radiation Oncology | Admitting: Radiation Oncology

## 2016-07-22 ENCOUNTER — Other Ambulatory Visit: Payer: Self-pay | Admitting: *Deleted

## 2016-07-22 DIAGNOSIS — C439 Malignant melanoma of skin, unspecified: Secondary | ICD-10-CM | POA: Diagnosis not present

## 2016-07-22 DIAGNOSIS — C7951 Secondary malignant neoplasm of bone: Secondary | ICD-10-CM | POA: Diagnosis not present

## 2016-07-22 DIAGNOSIS — Z510 Encounter for antineoplastic radiation therapy: Secondary | ICD-10-CM | POA: Diagnosis not present

## 2016-07-22 DIAGNOSIS — C799 Secondary malignant neoplasm of unspecified site: Secondary | ICD-10-CM

## 2016-07-22 DIAGNOSIS — Z923 Personal history of irradiation: Secondary | ICD-10-CM | POA: Diagnosis not present

## 2016-07-26 ENCOUNTER — Other Ambulatory Visit: Payer: Self-pay | Admitting: *Deleted

## 2016-07-26 ENCOUNTER — Ambulatory Visit
Admission: RE | Admit: 2016-07-26 | Discharge: 2016-07-26 | Disposition: A | Discharge status: Home / Self Care | Payer: Medicare Other | Source: Ambulatory Visit | Attending: Radiation Oncology | Admitting: Radiation Oncology

## 2016-07-26 DIAGNOSIS — Z923 Personal history of irradiation: Secondary | ICD-10-CM | POA: Diagnosis not present

## 2016-07-26 DIAGNOSIS — C439 Malignant melanoma of skin, unspecified: Secondary | ICD-10-CM | POA: Diagnosis not present

## 2016-07-26 DIAGNOSIS — Z510 Encounter for antineoplastic radiation therapy: Secondary | ICD-10-CM | POA: Diagnosis not present

## 2016-07-26 DIAGNOSIS — C7951 Secondary malignant neoplasm of bone: Secondary | ICD-10-CM | POA: Diagnosis not present

## 2016-07-26 DIAGNOSIS — C799 Secondary malignant neoplasm of unspecified site: Secondary | ICD-10-CM

## 2016-07-26 MED ORDER — POTASSIUM CHLORIDE CRYS ER 20 MEQ PO TBCR: 20.0000 meq | EXTENDED_RELEASE_TABLET | Freq: Every day | ORAL | 1 refills | Status: DC

## 2016-07-26 NOTE — Telephone Encounter (Signed)
   Ref Range & Units 5d ago  Potassium 3.5 - 5.1 mmol/L 3.5

## 2016-07-27 ENCOUNTER — Inpatient Hospital Stay: Payer: Medicare Other

## 2016-07-27 ENCOUNTER — Telehealth: Payer: Self-pay

## 2016-07-27 ENCOUNTER — Ambulatory Visit
Admission: RE | Admit: 2016-07-27 | Discharge: 2016-07-27 | Disposition: A | Discharge status: Home / Self Care | Payer: Medicare Other | Source: Ambulatory Visit | Attending: Radiation Oncology | Admitting: Radiation Oncology

## 2016-07-27 DIAGNOSIS — M25552 Pain in left hip: Secondary | ICD-10-CM | POA: Diagnosis not present

## 2016-07-27 DIAGNOSIS — K219 Gastro-esophageal reflux disease without esophagitis: Secondary | ICD-10-CM | POA: Diagnosis not present

## 2016-07-27 DIAGNOSIS — C4371 Malignant melanoma of right lower limb, including hip: Secondary | ICD-10-CM | POA: Diagnosis not present

## 2016-07-27 DIAGNOSIS — C799 Secondary malignant neoplasm of unspecified site: Secondary | ICD-10-CM

## 2016-07-27 DIAGNOSIS — C7951 Secondary malignant neoplasm of bone: Secondary | ICD-10-CM | POA: Diagnosis not present

## 2016-07-27 DIAGNOSIS — Z923 Personal history of irradiation: Secondary | ICD-10-CM | POA: Diagnosis not present

## 2016-07-27 DIAGNOSIS — Z510 Encounter for antineoplastic radiation therapy: Secondary | ICD-10-CM | POA: Diagnosis not present

## 2016-07-27 DIAGNOSIS — I509 Heart failure, unspecified: Secondary | ICD-10-CM | POA: Diagnosis not present

## 2016-07-27 DIAGNOSIS — C439 Malignant melanoma of skin, unspecified: Secondary | ICD-10-CM | POA: Diagnosis not present

## 2016-07-27 DIAGNOSIS — R110 Nausea: Secondary | ICD-10-CM | POA: Diagnosis not present

## 2016-07-27 DIAGNOSIS — R197 Diarrhea, unspecified: Secondary | ICD-10-CM | POA: Diagnosis not present

## 2016-07-27 LAB — CBC
HEMATOCRIT: 35.1 % (ref 35.0–47.0)
Hemoglobin: 12.3 g/dL (ref 12.0–16.0)
MCH: 32.7 pg (ref 26.0–34.0)
MCHC: 35 g/dL (ref 32.0–36.0)
MCV: 93.4 fL (ref 80.0–100.0)
Platelets: 335 10*3/uL (ref 150–440)
RBC: 3.75 MIL/uL — ABNORMAL LOW (ref 3.80–5.20)
RDW: 13.1 % (ref 11.5–14.5)
WBC: 7.8 10*3/uL (ref 3.6–11.0)

## 2016-07-27 NOTE — Telephone Encounter (Signed)
-----   Message from Lloyd Huger, MD sent at 07/26/2016  1:00 PM EDT ----- Regarding: RE: injections? Should be fine.   ----- Message ----- From: Luretha Murphy, CMA Sent: 07/26/2016   9:48 AM To: Lloyd Huger, MD Subject: injections?                                    Patient called over the holiday asking if she would be ok to have Cortisone injections? She has been having pain in her wrists and other joints and would like to get injections.

## 2016-07-27 NOTE — Telephone Encounter (Signed)
Patient has been notified that it is ok for her to get injections.

## 2016-07-28 ENCOUNTER — Ambulatory Visit
Admission: RE | Admit: 2016-07-28 | Discharge: 2016-07-28 | Disposition: A | Discharge status: Home / Self Care | Payer: Medicare Other | Source: Ambulatory Visit | Attending: Radiation Oncology | Admitting: Radiation Oncology

## 2016-07-28 DIAGNOSIS — Z510 Encounter for antineoplastic radiation therapy: Secondary | ICD-10-CM | POA: Diagnosis not present

## 2016-07-28 DIAGNOSIS — C439 Malignant melanoma of skin, unspecified: Secondary | ICD-10-CM | POA: Diagnosis not present

## 2016-07-28 DIAGNOSIS — C7951 Secondary malignant neoplasm of bone: Secondary | ICD-10-CM | POA: Diagnosis not present

## 2016-07-28 DIAGNOSIS — Z923 Personal history of irradiation: Secondary | ICD-10-CM | POA: Diagnosis not present

## 2016-07-29 ENCOUNTER — Ambulatory Visit
Admission: RE | Admit: 2016-07-29 | Discharge: 2016-07-29 | Disposition: A | Discharge status: Home / Self Care | Payer: Medicare Other | Source: Ambulatory Visit | Attending: Radiation Oncology | Admitting: Radiation Oncology

## 2016-07-29 DIAGNOSIS — M25532 Pain in left wrist: Secondary | ICD-10-CM | POA: Diagnosis not present

## 2016-07-29 DIAGNOSIS — C7951 Secondary malignant neoplasm of bone: Secondary | ICD-10-CM | POA: Diagnosis not present

## 2016-07-29 DIAGNOSIS — Z923 Personal history of irradiation: Secondary | ICD-10-CM | POA: Diagnosis not present

## 2016-07-29 DIAGNOSIS — M25531 Pain in right wrist: Secondary | ICD-10-CM | POA: Diagnosis not present

## 2016-07-29 DIAGNOSIS — Z510 Encounter for antineoplastic radiation therapy: Secondary | ICD-10-CM | POA: Diagnosis not present

## 2016-07-29 DIAGNOSIS — K5732 Diverticulitis of large intestine without perforation or abscess without bleeding: Secondary | ICD-10-CM | POA: Diagnosis not present

## 2016-07-29 DIAGNOSIS — C439 Malignant melanoma of skin, unspecified: Secondary | ICD-10-CM | POA: Diagnosis not present

## 2016-08-01 ENCOUNTER — Ambulatory Visit
Admission: RE | Admit: 2016-08-01 | Discharge: 2016-08-01 | Disposition: A | Discharge status: Home / Self Care | Payer: Medicare Other | Source: Ambulatory Visit | Attending: Radiation Oncology | Admitting: Radiation Oncology

## 2016-08-01 DIAGNOSIS — Z66 Do not resuscitate: Secondary | ICD-10-CM | POA: Diagnosis present

## 2016-08-01 DIAGNOSIS — F17210 Nicotine dependence, cigarettes, uncomplicated: Secondary | ICD-10-CM | POA: Diagnosis present

## 2016-08-01 DIAGNOSIS — E039 Hypothyroidism, unspecified: Secondary | ICD-10-CM | POA: Diagnosis not present

## 2016-08-01 DIAGNOSIS — F319 Bipolar disorder, unspecified: Secondary | ICD-10-CM | POA: Diagnosis present

## 2016-08-01 DIAGNOSIS — C7951 Secondary malignant neoplasm of bone: Secondary | ICD-10-CM | POA: Diagnosis not present

## 2016-08-01 DIAGNOSIS — B370 Candidal stomatitis: Secondary | ICD-10-CM | POA: Diagnosis not present

## 2016-08-01 DIAGNOSIS — E2740 Unspecified adrenocortical insufficiency: Secondary | ICD-10-CM | POA: Diagnosis not present

## 2016-08-01 DIAGNOSIS — C439 Malignant melanoma of skin, unspecified: Secondary | ICD-10-CM | POA: Diagnosis not present

## 2016-08-01 DIAGNOSIS — K859 Acute pancreatitis without necrosis or infection, unspecified: Secondary | ICD-10-CM | POA: Diagnosis not present

## 2016-08-01 DIAGNOSIS — N183 Chronic kidney disease, stage 3 (moderate): Secondary | ICD-10-CM | POA: Diagnosis not present

## 2016-08-01 DIAGNOSIS — K853 Drug induced acute pancreatitis without necrosis or infection: Secondary | ICD-10-CM | POA: Diagnosis not present

## 2016-08-01 DIAGNOSIS — Z923 Personal history of irradiation: Secondary | ICD-10-CM | POA: Diagnosis not present

## 2016-08-01 DIAGNOSIS — Z510 Encounter for antineoplastic radiation therapy: Secondary | ICD-10-CM | POA: Diagnosis not present

## 2016-08-02 ENCOUNTER — Ambulatory Visit: Payer: Medicare Other

## 2016-08-03 ENCOUNTER — Ambulatory Visit: Payer: Medicare Other

## 2016-08-04 ENCOUNTER — Inpatient Hospital Stay: Payer: Medicare Other | Admitting: Oncology

## 2016-08-04 ENCOUNTER — Ambulatory Visit: Payer: Medicare Other

## 2016-08-04 ENCOUNTER — Inpatient Hospital Stay: Payer: Medicare Other

## 2016-08-05 ENCOUNTER — Ambulatory Visit: Payer: Medicare Other

## 2016-08-05 DIAGNOSIS — K909 Intestinal malabsorption, unspecified: Secondary | ICD-10-CM | POA: Insufficient documentation

## 2016-08-08 ENCOUNTER — Ambulatory Visit: Payer: Medicare Other

## 2016-08-08 ENCOUNTER — Ambulatory Visit
Admission: RE | Admit: 2016-08-08 | Discharge: 2016-08-08 | Disposition: A | Discharge status: Home / Self Care | Payer: Medicare Other | Source: Ambulatory Visit | Attending: Radiation Oncology | Admitting: Radiation Oncology

## 2016-08-08 DIAGNOSIS — C439 Malignant melanoma of skin, unspecified: Secondary | ICD-10-CM | POA: Diagnosis not present

## 2016-08-08 DIAGNOSIS — Z510 Encounter for antineoplastic radiation therapy: Secondary | ICD-10-CM | POA: Diagnosis not present

## 2016-08-08 DIAGNOSIS — C7951 Secondary malignant neoplasm of bone: Secondary | ICD-10-CM | POA: Diagnosis not present

## 2016-08-08 DIAGNOSIS — Z923 Personal history of irradiation: Secondary | ICD-10-CM | POA: Diagnosis not present

## 2016-08-09 ENCOUNTER — Ambulatory Visit: Payer: Medicare Other

## 2016-08-09 DIAGNOSIS — B379 Candidiasis, unspecified: Secondary | ICD-10-CM | POA: Insufficient documentation

## 2016-08-09 DIAGNOSIS — K5792 Diverticulitis of intestine, part unspecified, without perforation or abscess without bleeding: Secondary | ICD-10-CM | POA: Insufficient documentation

## 2016-08-09 NOTE — Progress Notes (Signed)
[facility]  [phone]) [phone] [phone]) [phone]  ID: Patricia Li OB: [birth year]  MR#: [id number]  BXU#:[id number]  Patient Care Team: Jene Every, MD as PCP - General (Family Medicine) Bary Castilla, Forest Gleason, MD as Consulting Physician (General Surgery) Forest Gleason, MD (Oncology) Lloyd Huger, MD as Consulting Physician (Oncology)  CHIEF COMPLAINT: Recurrent metastatic melanoma, BRAF+, of right lower extremity, metastatic.  INTERVAL HISTORY: Patient returns to clinic today for further evaluation and consideration of cycle 8 of maintenance nivolumab. She missed her most recent appointment secondary to admission to an outside hospital with acute pancreatitis. She continues to have mild residual abdominal pain, but significantly improved since her discharge. She has discontinued Plaquenil. She has no neurological complaints. She denies any recent fevers or illnesses. She has a good appetite. She denies any chest pain or shortness of breath. She has no nausea, vomiting, or constipation. She has no urinary complaints. Patient offers no further specific complaints today.   REVIEW OF SYSTEMS:   Review of Systems  Constitutional: Negative.  Negative for fever, malaise/fatigue and weight loss.  HENT: Negative.  Negative for ear pain and tinnitus.   Eyes: Negative.   Respiratory: Negative for cough and shortness of breath.   Cardiovascular: Negative.  Negative for chest pain and leg swelling.  Gastrointestinal: Positive for abdominal pain and heartburn. Negative for blood in stool and diarrhea.  Genitourinary: Negative.   Musculoskeletal: Positive for joint pain.  Skin: Negative for itching and rash.  Neurological: Negative.  Negative for sensory change and weakness.  Endo/Heme/Allergies: Does not bruise/bleed easily.  Psychiatric/Behavioral: Negative.  The patient is not nervous/anxious and does not have insomnia.     As per HPI. Otherwise, a complete  review of systems is negative.  PAST MEDICAL HISTORY: Past Medical History:  Diagnosis Date  . CHF (congestive heart failure) ([place]) 2016   Dr. Nehemiah Massed  . GERD (gastroesophageal reflux disease) 2012  . Hypothyroidism   . Lumbar herniated disc 2010  . Malignant melanoma of skin of lower limb, including hip ([place]) 2008, 2017   right thigh  . Personal history of malignant melanoma of skin 2012  . Tendonitis   . Tick bite of head 07/20/2015    PAST SURGICAL HISTORY: Past Surgical History:  Procedure Laterality Date  . BACK SURGERY  2010  . BREAST CYST EXCISION  1985  . COLONOSCOPY  07/2012   [facility]  . FRACTURE SURGERY     rod removed from left femur 07/21/2014  . INGUINAL LYMPH NODE BIOPSY Right 07/17/2015   Procedure: INGUINAL LYMPH NODE BIOPSY/EXCISION;  Surgeon: Robert Bellow, MD;  Location: [facility] ORS;  Service: General;  Laterality: Right;  . MELANOMA EXCISION  2008   right thigh  . PORTACATH PLACEMENT Left 03/14/2016   Procedure: INSERTION PORT-A-CATH;  Surgeon: Robert Bellow, MD;  Location: [facility] ORS;  Service: General;  Laterality: Left;  . SKIN LESION EXCISION Right 12-19-10   posterior    FAMILY HISTORY: Family History  Problem Relation Age of Onset  . COPD Father   . Cancer Sister        Colon, sister       ADVANCED DIRECTIVES:    HEALTH MAINTENANCE: Social History  Substance Use Topics  . Smoking status: Current Every Day Smoker    Packs/day: 0.50    Years: 20.00    Types: Cigarettes  . Smokeless tobacco: Never Used  . Alcohol use 0.0 oz/week     Comment: ocassionally     Colonoscopy:  PAP:  Bone density:  Lipid panel:  Allergies  Allergen Reactions  . Clindamycin Shortness Of Breath  . Levaquin [Levofloxacin] Other (See Comments)    Joint inflammation  . Aripiprazole Other (See Comments)    Facial and head ticks  . Valproic Acid     Other reaction(s): Other (See Comments) Facial twitching    Current Outpatient Prescriptions    Medication Sig Dispense Refill  . aspirin-acetaminophen-caffeine (EXCEDRIN MIGRAINE) 250-250-65 MG tablet Take 2 tablets by mouth daily as needed for headache.    . calcium carbonate (OS-CAL) 600 MG TABS tablet Take 600 mg by mouth 2 (two) times daily with a meal.    . clonazePAM (KLONOPIN) 0.5 MG tablet Take 0.5 mg by mouth at bedtime.    . furosemide (LASIX) 20 MG tablet Take 20 mg by mouth every other day.     . gabapentin (NEURONTIN) 300 MG capsule Take 300 mg by mouth 3 (three) times daily.    Marland Kitchen HYDROcodone-acetaminophen (NORCO/VICODIN) 5-325 MG tablet Take 1 tablet by mouth every 4 (four) hours as needed for moderate pain. 60 tablet 0  . hydrocortisone (CORTEF) 10 MG tablet Take 10-15 mg by mouth 2 (two) times daily. 1.5 tablets (15 MG) in the morning and 1 tablet (10 MG) in the evening    . levothyroxine (SYNTHROID, LEVOTHROID) 112 MCG tablet Take 112 mcg by mouth daily before breakfast.     . lipase/protease/amylase (CREON) 12000 units CPEP capsule Take 12,000 Units by mouth 3 (three) times daily with meals.    . metoprolol tartrate (LOPRESSOR) 25 MG tablet Take 12.5 mg by mouth 2 (two) times daily.     . Multiple Vitamin (MULTIVITAMIN WITH MINERALS) TABS tablet Take 1 tablet by mouth daily.    . ondansetron (ZOFRAN) 4 MG tablet Take 1 tablet (4 mg total) by mouth every 8 (eight) hours as needed for nausea or vomiting. 30 tablet 2  . pantoprazole (PROTONIX) 40 MG tablet Take 40 mg by mouth 2 (two) times daily.     . predniSONE (DELTASONE) 10 MG tablet Take 10 mg by mouth daily with breakfast.    . venlafaxine XR (EFFEXOR-XR) 150 MG 24 hr capsule 150 mg every morning.     . venlafaxine XR (EFFEXOR-XR) 75 MG 24 hr capsule Take 75 mg by mouth every evening.     No current facility-administered medications for this visit.     OBJECTIVE: Vitals:   08/10/16 1436  BP: 131/86  Pulse: 65  Resp: 20  Temp: (!) 96.5 F (35.8 C)     Body mass index is 27.52 kg/m.    ECOG FS:0 -  Asymptomatic  General: Well-developed, well-nourished, no acute distress. Eyes: Pink conjunctiva, anicteric sclera. Lungs: Clear to auscultation bilaterally. Heart: Regular rate and rhythm. No rubs, murmurs, or gallops. Abdomen: Soft, nontender, nondistended. No organomegaly noted, normoactive bowel sounds. Musculoskeletal: No edema, cyanosis, or clubbing. Mild swelling of joints, no decrease in strength. Mild tenderness to touch. Neuro: Alert, answering all questions appropriately. Cranial nerves grossly intact. Skin: No rash or petechiae noted. Psych: Normal affect.   LAB RESULTS:  Lab Results  Component Value Date   NA 133 (L) 08/10/2016   K 3.4 (L) 08/10/2016   CL 99 (L) 08/10/2016   CO2 27 08/10/2016   GLUCOSE 93 08/10/2016   BUN 17 08/10/2016   CREATININE 1.57 (H) 08/10/2016   CALCIUM 9.0 08/10/2016   PROT 6.7 08/10/2016   ALBUMIN 3.9 08/10/2016   AST 30 08/10/2016   ALT  20 08/10/2016   ALKPHOS 64 08/10/2016   BILITOT 0.5 08/10/2016   GFRNONAA 36 (L) 08/10/2016   GFRAA 42 (L) 08/10/2016    Lab Results  Component Value Date   WBC 5.0 08/10/2016   NEUTROABS 3.4 08/10/2016   HGB 11.0 (L) 08/10/2016   HCT 31.1 (L) 08/10/2016   MCV 92.0 08/10/2016   PLT 297 08/10/2016    STUDIES: No results found.  ONCOLOGIC HISTORY: Patient initially diagnosed with metastatic melanoma to right thigh with unknown primary in 2009. She received 4 weeks of high-dose interferon therapy in February 2009 and proceed with low-dose interferon therapy in March 2009. Patient remained in remission until August 2012 when she was noted to have recurrent melanoma and a pelvic lymph node. October 2012 she had 2 cycles of Ipilumimab which was discontinued secondary to poor tolerance. In April 2017 PET scan showed recurrent disease in the right inguinal region which was confirmed by biopsy. Zeboraf discontinued in November 2017 secondary to intolerable rash. Patient initiated combination immunotherapy  with nivolumab and ipilumimab on January 14, 2016. Patient initiated maintenance nivolumab on April 14, 2016.  ASSESSMENT: Recurrent metastatic melanoma, BRAF+, of right lower extremity, metastatic.  PLAN:    1. Recurrent metastatic melanoma, BRAF+, of right lower extremity, metastatic:  PET scan results from Jul 06, 2016 reviewed independently with overall improvement of disease burden. She does have a new iliac crest lesion that may be contributing to her pain and she will complete palliative XRT on August 11, 2016. Previously, patient did not want to attempt another oral medication or oral combination such as dabrafenib/trametinib or cobimetinib. She has completed 4 cycles of 1 mg/kg nivolumab + 3 mg/kg ipilumimab. Proceed with cycle 8 of maintenance treatment with 240 mg flat dose nivolumab every 2 weeks until disease progression or unacceptable toxicity. Return to clinic in 2 weeks for consideration cycle 9. Will reimage with PET scan in August 2018 2. Pain: Patient states she has discontinued Plaquenil. XRT as above for left hip pain.  3. Nausea: Continue Compazine as needed. 4. Diarrhea: Continue Imodium OTC as needed. 5. Pancreatitis: Unclear etiology. Unlikely related to nivolumab. Patient's amylase is within normal limits, lipase is mildly elevated at 186. Monitor.  Approximately 30 minutes was spent in discussion of which greater than 50% was consultation.  Patient expressed understanding and was in agreement with this plan. She also understands that She can call clinic at any time with any questions, concerns, or complaints.    Lloyd Huger, MD 08/12/16 3:03 PM

## 2016-08-10 ENCOUNTER — Ambulatory Visit
Admission: RE | Admit: 2016-08-10 | Discharge: 2016-08-10 | Disposition: A | Discharge status: Home / Self Care | Payer: Medicare Other | Source: Ambulatory Visit | Attending: Radiation Oncology | Admitting: Radiation Oncology

## 2016-08-10 ENCOUNTER — Ambulatory Visit: Payer: Medicare Other

## 2016-08-10 ENCOUNTER — Inpatient Hospital Stay: Payer: Medicare Other | Attending: Oncology | Admitting: *Deleted

## 2016-08-10 ENCOUNTER — Inpatient Hospital Stay: Payer: Medicare Other

## 2016-08-10 ENCOUNTER — Inpatient Hospital Stay (HOSPITAL_BASED_OUTPATIENT_CLINIC_OR_DEPARTMENT_OTHER): Payer: Medicare Other | Admitting: Oncology

## 2016-08-10 VITALS — BP 131/86 | HR 65 | Temp 96.5°F | Resp 20 | Wt 175.7 lb

## 2016-08-10 DIAGNOSIS — E039 Hypothyroidism, unspecified: Secondary | ICD-10-CM | POA: Diagnosis not present

## 2016-08-10 DIAGNOSIS — F17210 Nicotine dependence, cigarettes, uncomplicated: Secondary | ICD-10-CM | POA: Diagnosis not present

## 2016-08-10 DIAGNOSIS — R197 Diarrhea, unspecified: Secondary | ICD-10-CM | POA: Diagnosis not present

## 2016-08-10 DIAGNOSIS — K859 Acute pancreatitis without necrosis or infection, unspecified: Secondary | ICD-10-CM

## 2016-08-10 DIAGNOSIS — C439 Malignant melanoma of skin, unspecified: Secondary | ICD-10-CM

## 2016-08-10 DIAGNOSIS — Z79899 Other long term (current) drug therapy: Secondary | ICD-10-CM | POA: Insufficient documentation

## 2016-08-10 DIAGNOSIS — Z8719 Personal history of other diseases of the digestive system: Secondary | ICD-10-CM | POA: Insufficient documentation

## 2016-08-10 DIAGNOSIS — C4371 Malignant melanoma of right lower limb, including hip: Secondary | ICD-10-CM

## 2016-08-10 DIAGNOSIS — R110 Nausea: Secondary | ICD-10-CM | POA: Diagnosis not present

## 2016-08-10 DIAGNOSIS — C799 Secondary malignant neoplasm of unspecified site: Secondary | ICD-10-CM | POA: Diagnosis not present

## 2016-08-10 DIAGNOSIS — Z5112 Encounter for antineoplastic immunotherapy: Secondary | ICD-10-CM | POA: Insufficient documentation

## 2016-08-10 DIAGNOSIS — I509 Heart failure, unspecified: Secondary | ICD-10-CM | POA: Diagnosis not present

## 2016-08-10 DIAGNOSIS — Z923 Personal history of irradiation: Secondary | ICD-10-CM

## 2016-08-10 DIAGNOSIS — Z800 Family history of malignant neoplasm of digestive organs: Secondary | ICD-10-CM | POA: Diagnosis not present

## 2016-08-10 DIAGNOSIS — K219 Gastro-esophageal reflux disease without esophagitis: Secondary | ICD-10-CM | POA: Diagnosis not present

## 2016-08-10 DIAGNOSIS — Z9221 Personal history of antineoplastic chemotherapy: Secondary | ICD-10-CM | POA: Diagnosis not present

## 2016-08-10 DIAGNOSIS — R109 Unspecified abdominal pain: Secondary | ICD-10-CM | POA: Diagnosis not present

## 2016-08-10 DIAGNOSIS — C7951 Secondary malignant neoplasm of bone: Secondary | ICD-10-CM | POA: Diagnosis not present

## 2016-08-10 DIAGNOSIS — Z510 Encounter for antineoplastic radiation therapy: Secondary | ICD-10-CM | POA: Diagnosis not present

## 2016-08-10 LAB — CBC WITH DIFFERENTIAL/PLATELET
BASOS ABS: 0 10*3/uL (ref 0–0.1)
BASOS PCT: 1 %
EOS ABS: 0.6 10*3/uL (ref 0–0.7)
EOS PCT: 12 %
HCT: 31.1 % — ABNORMAL LOW (ref 35.0–47.0)
Hemoglobin: 11 g/dL — ABNORMAL LOW (ref 12.0–16.0)
Lymphocytes Relative: 12 %
Lymphs Abs: 0.6 10*3/uL — ABNORMAL LOW (ref 1.0–3.6)
MCH: 32.5 pg (ref 26.0–34.0)
MCHC: 35.3 g/dL (ref 32.0–36.0)
MCV: 92 fL (ref 80.0–100.0)
Monocytes Absolute: 0.4 10*3/uL (ref 0.2–0.9)
Monocytes Relative: 8 %
Neutro Abs: 3.4 10*3/uL (ref 1.4–6.5)
Neutrophils Relative %: 69 %
PLATELETS: 297 10*3/uL (ref 150–440)
RBC: 3.38 MIL/uL — ABNORMAL LOW (ref 3.80–5.20)
RDW: 13.1 % (ref 11.5–14.5)
WBC: 5 10*3/uL (ref 3.6–11.0)

## 2016-08-10 LAB — COMPREHENSIVE METABOLIC PANEL
ALT: 20 U/L (ref 14–54)
AST: 30 U/L (ref 15–41)
Albumin: 3.9 g/dL (ref 3.5–5.0)
Alkaline Phosphatase: 64 U/L (ref 38–126)
Anion gap: 7 (ref 5–15)
BUN: 17 mg/dL (ref 6–20)
CHLORIDE: 99 mmol/L — AB (ref 101–111)
CO2: 27 mmol/L (ref 22–32)
Calcium: 9 mg/dL (ref 8.9–10.3)
Creatinine, Ser: 1.57 mg/dL — ABNORMAL HIGH (ref 0.44–1.00)
GFR calc non Af Amer: 36 mL/min — ABNORMAL LOW (ref >60)
GFR, EST AFRICAN AMERICAN: 42 mL/min — AB (ref >60)
Glucose, Bld: 93 mg/dL (ref 65–99)
Potassium: 3.4 mmol/L — ABNORMAL LOW (ref 3.5–5.1)
SODIUM: 133 mmol/L — AB (ref 135–145)
Total Bilirubin: 0.5 mg/dL (ref 0.3–1.2)
Total Protein: 6.7 g/dL (ref 6.5–8.1)

## 2016-08-10 LAB — AMYLASE: Amylase: 77 U/L (ref 28–100)

## 2016-08-10 LAB — LIPASE, BLOOD: Lipase: 186 U/L — ABNORMAL HIGH (ref 11–51)

## 2016-08-10 MED ORDER — NIVOLUMAB CHEMO INJECTION 100 MG/10ML
240.0000 mg | Freq: Once | INTRAVENOUS | Status: AC
  Administered 2016-08-10: 240 mg via INTRAVENOUS
  Filled 2016-08-10: qty 24

## 2016-08-10 MED ORDER — SODIUM CHLORIDE 0.9 % IV SOLN
Freq: Once | INTRAVENOUS | Status: AC
  Administered 2016-08-10: 15:00:00 via INTRAVENOUS
  Filled 2016-08-10: qty 1000

## 2016-08-10 MED ORDER — HEPARIN SOD (PORK) LOCK FLUSH 100 UNIT/ML IV SOLN
500.0000 [IU] | Freq: Once | INTRAVENOUS | Status: AC | PRN
  Administered 2016-08-10: 500 [IU]
  Filled 2016-08-10: qty 5

## 2016-08-10 NOTE — Progress Notes (Signed)
Patient recently hospitalized for pancreatitis, would like to know results of pancreatic enzymes today.

## 2016-08-11 ENCOUNTER — Ambulatory Visit
Admission: RE | Admit: 2016-08-11 | Discharge: 2016-08-11 | Disposition: A | Discharge status: Home / Self Care | Payer: Medicare Other | Source: Ambulatory Visit | Attending: Radiation Oncology | Admitting: Radiation Oncology

## 2016-08-11 ENCOUNTER — Ambulatory Visit: Payer: Medicare Other

## 2016-08-11 DIAGNOSIS — C779 Secondary and unspecified malignant neoplasm of lymph node, unspecified: Secondary | ICD-10-CM | POA: Diagnosis not present

## 2016-08-11 DIAGNOSIS — C7951 Secondary malignant neoplasm of bone: Secondary | ICD-10-CM | POA: Diagnosis not present

## 2016-08-11 DIAGNOSIS — Z923 Personal history of irradiation: Secondary | ICD-10-CM | POA: Diagnosis not present

## 2016-08-11 DIAGNOSIS — Z510 Encounter for antineoplastic radiation therapy: Secondary | ICD-10-CM | POA: Diagnosis not present

## 2016-08-11 DIAGNOSIS — C439 Malignant melanoma of skin, unspecified: Secondary | ICD-10-CM | POA: Diagnosis not present

## 2016-08-12 DIAGNOSIS — Z6827 Body mass index (BMI) 27.0-27.9, adult: Secondary | ICD-10-CM | POA: Diagnosis not present

## 2016-08-12 DIAGNOSIS — Z7689 Persons encountering health services in other specified circumstances: Secondary | ICD-10-CM | POA: Diagnosis not present

## 2016-08-12 DIAGNOSIS — Z8719 Personal history of other diseases of the digestive system: Secondary | ICD-10-CM | POA: Diagnosis not present

## 2016-08-18 DIAGNOSIS — K625 Hemorrhage of anus and rectum: Secondary | ICD-10-CM | POA: Diagnosis not present

## 2016-08-18 DIAGNOSIS — R1013 Epigastric pain: Secondary | ICD-10-CM | POA: Diagnosis not present

## 2016-08-18 DIAGNOSIS — Z8719 Personal history of other diseases of the digestive system: Secondary | ICD-10-CM | POA: Diagnosis not present

## 2016-08-23 DIAGNOSIS — K853 Drug induced acute pancreatitis without necrosis or infection: Secondary | ICD-10-CM | POA: Diagnosis not present

## 2016-08-23 NOTE — Progress Notes (Deleted)
Patricia Li  Telephone:(336) [phone] Fax:(336) [phone]  ID: Patricia Li OB: 1960/07/09  MR#: [id number]  CBU#:[id number]  Patient Care Team: Jene Every, MD as PCP - General (Family Medicine) Bary Castilla, Forest Gleason, MD as Consulting Physician (General Surgery) Forest Gleason, MD (Oncology) Lloyd Huger, MD as Consulting Physician (Oncology)  CHIEF COMPLAINT: Recurrent metastatic melanoma, BRAF+, of right lower extremity, metastatic.  INTERVAL HISTORY: Patient returns to clinic today for further evaluation and consideration of cycle 8 of maintenance nivolumab. She missed her most recent appointment secondary to admission to an outside hospital with acute pancreatitis. She continues to have mild residual abdominal pain, but significantly improved since her discharge. She has discontinued Plaquenil. She has no neurological complaints. She denies any recent fevers or illnesses. She has a good appetite. She denies any chest pain or shortness of breath. She has no nausea, vomiting, or constipation. She has no urinary complaints. Patient offers no further specific complaints today.   REVIEW OF SYSTEMS:   Review of Systems  Constitutional: Negative.  Negative for fever, malaise/fatigue and weight loss.  HENT: Negative.  Negative for ear pain and tinnitus.   Eyes: Negative.   Respiratory: Negative for cough and shortness of breath.   Cardiovascular: Negative.  Negative for chest pain and leg swelling.  Gastrointestinal: Positive for abdominal pain and heartburn. Negative for blood in stool and diarrhea.  Genitourinary: Negative.   Musculoskeletal: Positive for joint pain.  Skin: Negative for itching and rash.  Neurological: Negative.  Negative for sensory change and weakness.  Endo/Heme/Allergies: Does not bruise/bleed easily.  Psychiatric/Behavioral: Negative.  The patient is not nervous/anxious and does not have insomnia.     As per HPI. Otherwise, a complete  review of systems is negative.  PAST MEDICAL HISTORY: Past Medical History:  Diagnosis Date  . CHF (congestive heart failure) ([place]) 2016   Dr. Nehemiah Massed  . GERD (gastroesophageal reflux disease) 2012  . Hypothyroidism   . Lumbar herniated disc 2010  . Malignant melanoma of skin of lower limb, including hip ([place]) 2008, 2017   right thigh  . Personal history of malignant melanoma of skin 2012  . Tendonitis   . Tick bite of head 07/20/2015    PAST SURGICAL HISTORY: Past Surgical History:  Procedure Laterality Date  . BACK SURGERY  2010  . BREAST CYST EXCISION  1985  . COLONOSCOPY  07/2012   [facility]  . FRACTURE SURGERY     rod removed from left femur 07/21/2014  . INGUINAL LYMPH NODE BIOPSY Right 07/17/2015   Procedure: INGUINAL LYMPH NODE BIOPSY/EXCISION;  Surgeon: Robert Bellow, MD;  Location: [facility] ORS;  Service: General;  Laterality: Right;  . MELANOMA EXCISION  2008   right thigh  . PORTACATH PLACEMENT Left 03/14/2016   Procedure: INSERTION PORT-A-CATH;  Surgeon: Robert Bellow, MD;  Location: [facility] ORS;  Service: General;  Laterality: Left;  . SKIN LESION EXCISION Right 12-19-10   posterior    FAMILY HISTORY: Family History  Problem Relation Age of Onset  . COPD Father   . Cancer Sister        Colon, sister       ADVANCED DIRECTIVES:    HEALTH MAINTENANCE: Social History  Substance Use Topics  . Smoking status: Current Every Day Smoker    Packs/day: 0.50    Years: 20.00    Types: Cigarettes  . Smokeless tobacco: Never Used  . Alcohol use 0.0 oz/week     Comment: ocassionally     Colonoscopy:  PAP:  Bone density:  Lipid panel:  Allergies  Allergen Reactions  . Clindamycin Shortness Of Breath  . Levaquin [Levofloxacin] Other (See Comments)    Joint inflammation  . Aripiprazole Other (See Comments)    Facial and head ticks  . Valproic Acid     Other reaction(s): Other (See Comments) Facial twitching    Current Outpatient Prescriptions    Medication Sig Dispense Refill  . aspirin-acetaminophen-caffeine (EXCEDRIN MIGRAINE) 250-250-65 MG tablet Take 2 tablets by mouth daily as needed for headache.    . calcium carbonate (OS-CAL) 600 MG TABS tablet Take 600 mg by mouth 2 (two) times daily with a meal.    . clonazePAM (KLONOPIN) 0.5 MG tablet Take 0.5 mg by mouth at bedtime.    . furosemide (LASIX) 20 MG tablet Take 20 mg by mouth every other day.     . gabapentin (NEURONTIN) 300 MG capsule Take 300 mg by mouth 3 (three) times daily.    Marland Kitchen HYDROcodone-acetaminophen (NORCO/VICODIN) 5-325 MG tablet Take 1 tablet by mouth every 4 (four) hours as needed for moderate pain. 60 tablet 0  . hydrocortisone (CORTEF) 10 MG tablet Take 10-15 mg by mouth 2 (two) times daily. 1.5 tablets (15 MG) in the morning and 1 tablet (10 MG) in the evening    . levothyroxine (SYNTHROID, LEVOTHROID) 112 MCG tablet Take 112 mcg by mouth daily before breakfast.     . lipase/protease/amylase (CREON) 12000 units CPEP capsule Take 12,000 Units by mouth 3 (three) times daily with meals.    . metoprolol tartrate (LOPRESSOR) 25 MG tablet Take 12.5 mg by mouth 2 (two) times daily.     . Multiple Vitamin (MULTIVITAMIN WITH MINERALS) TABS tablet Take 1 tablet by mouth daily.    . ondansetron (ZOFRAN) 4 MG tablet Take 1 tablet (4 mg total) by mouth every 8 (eight) hours as needed for nausea or vomiting. 30 tablet 2  . pantoprazole (PROTONIX) 40 MG tablet Take 40 mg by mouth 2 (two) times daily.     . predniSONE (DELTASONE) 10 MG tablet Take 10 mg by mouth daily with breakfast.    . venlafaxine XR (EFFEXOR-XR) 150 MG 24 hr capsule 150 mg every morning.     . venlafaxine XR (EFFEXOR-XR) 75 MG 24 hr capsule Take 75 mg by mouth every evening.     No current facility-administered medications for this visit.     OBJECTIVE: There were no vitals filed for this visit.   There is no height or weight on file to calculate BMI.    ECOG FS:0 - Asymptomatic  General:  Well-developed, well-nourished, no acute distress. Eyes: Pink conjunctiva, anicteric sclera. Lungs: Clear to auscultation bilaterally. Heart: Regular rate and rhythm. No rubs, murmurs, or gallops. Abdomen: Soft, nontender, nondistended. No organomegaly noted, normoactive bowel sounds. Musculoskeletal: No edema, cyanosis, or clubbing. Mild swelling of joints, no decrease in strength. Mild tenderness to touch. Neuro: Alert, answering all questions appropriately. Cranial nerves grossly intact. Skin: No rash or petechiae noted. Psych: Normal affect.   LAB RESULTS:  Lab Results  Component Value Date   NA 133 (L) 08/10/2016   K 3.4 (L) 08/10/2016   CL 99 (L) 08/10/2016   CO2 27 08/10/2016   GLUCOSE 93 08/10/2016   BUN 17 08/10/2016   CREATININE 1.57 (H) 08/10/2016   CALCIUM 9.0 08/10/2016   PROT 6.7 08/10/2016   ALBUMIN 3.9 08/10/2016   AST 30 08/10/2016   ALT 20 08/10/2016   ALKPHOS 64 08/10/2016   BILITOT  0.5 08/10/2016   GFRNONAA 36 (L) 08/10/2016   GFRAA 42 (L) 08/10/2016    Lab Results  Component Value Date   WBC 5.0 08/10/2016   NEUTROABS 3.4 08/10/2016   HGB 11.0 (L) 08/10/2016   HCT 31.1 (L) 08/10/2016   MCV 92.0 08/10/2016   PLT 297 08/10/2016    STUDIES: No results found.  ONCOLOGIC HISTORY: Patient initially diagnosed with metastatic melanoma to right thigh with unknown primary in 2009. She received 4 weeks of high-dose interferon therapy in February 2009 and proceed with low-dose interferon therapy in March 2009. Patient remained in remission until August 2012 when she was noted to have recurrent melanoma and a pelvic lymph node. October 2012 she had 2 cycles of Ipilumimab which was discontinued secondary to poor tolerance. In April 2017 PET scan showed recurrent disease in the right inguinal region which was confirmed by biopsy. Zeboraf discontinued in November 2017 secondary to intolerable rash. Patient initiated combination immunotherapy with nivolumab and  ipilumimab on January 14, 2016. Patient initiated maintenance nivolumab on April 14, 2016.  ASSESSMENT: Recurrent metastatic melanoma, BRAF+, of right lower extremity, metastatic.  PLAN:    1. Recurrent metastatic melanoma, BRAF+, of right lower extremity, metastatic:  PET scan results from Jul 06, 2016 reviewed independently with overall improvement of disease burden. She does have a new iliac crest lesion that may be contributing to her pain and she will complete palliative XRT on August 11, 2016. Previously, patient did not want to attempt another oral medication or oral combination such as dabrafenib/trametinib or cobimetinib. She has completed 4 cycles of 1 mg/kg nivolumab + 3 mg/kg ipilumimab. Proceed with cycle 8 of maintenance treatment with 240 mg flat dose nivolumab every 2 weeks until disease progression or unacceptable toxicity. Return to clinic in 2 weeks for consideration cycle 9. Will reimage with PET scan in August 2018 2. Pain: Patient states she has discontinued Plaquenil. XRT as above for left hip pain.  3. Nausea: Continue Compazine as needed. 4. Diarrhea: Continue Imodium OTC as needed. 5. Pancreatitis: Unclear etiology. Unlikely related to nivolumab. Patient's amylase is within normal limits, lipase is mildly elevated at 186. Monitor.  Approximately 30 minutes was spent in discussion of which greater than 50% was consultation.  Patient expressed understanding and was in agreement with this plan. She also understands that She can call clinic at any time with any questions, concerns, or complaints.    Lloyd Huger, MD 08/23/16 11:50 PM

## 2016-08-24 ENCOUNTER — Inpatient Hospital Stay: Payer: Medicare Other | Admitting: Oncology

## 2016-08-24 ENCOUNTER — Other Ambulatory Visit: Payer: Self-pay | Admitting: Nurse Practitioner

## 2016-08-24 ENCOUNTER — Inpatient Hospital Stay: Payer: Medicare Other

## 2016-08-24 ENCOUNTER — Telehealth: Payer: Self-pay

## 2016-08-24 DIAGNOSIS — C439 Malignant melanoma of skin, unspecified: Secondary | ICD-10-CM

## 2016-08-24 DIAGNOSIS — C799 Secondary malignant neoplasm of unspecified site: Secondary | ICD-10-CM

## 2016-08-24 DIAGNOSIS — R1013 Epigastric pain: Secondary | ICD-10-CM

## 2016-08-24 DIAGNOSIS — Z8719 Personal history of other diseases of the digestive system: Secondary | ICD-10-CM

## 2016-08-24 DIAGNOSIS — R1110 Vomiting, unspecified: Secondary | ICD-10-CM

## 2016-08-24 NOTE — Telephone Encounter (Signed)
Patient called this morning and left voicemail that she will not be coming in today for her appointment. She is not feeling good and might take herself to the ER.

## 2016-08-25 ENCOUNTER — Telehealth: Payer: Self-pay | Admitting: *Deleted

## 2016-08-25 ENCOUNTER — Other Ambulatory Visit
Admission: RE | Admit: 2016-08-25 | Discharge: 2016-08-25 | Disposition: A | Discharge status: Home / Self Care | Payer: Medicare Other | Source: Ambulatory Visit | Attending: Nurse Practitioner | Admitting: Nurse Practitioner

## 2016-08-25 DIAGNOSIS — R197 Diarrhea, unspecified: Secondary | ICD-10-CM | POA: Insufficient documentation

## 2016-08-25 DIAGNOSIS — C4371 Malignant melanoma of right lower limb, including hip: Secondary | ICD-10-CM

## 2016-08-25 LAB — GASTROINTESTINAL PANEL BY PCR, STOOL (REPLACES STOOL CULTURE)

## 2016-08-25 LAB — C DIFFICILE QUICK SCREEN W PCR REFLEX
C Diff antigen: NEGATIVE
C Diff interpretation: NOT DETECTED
C Diff toxin: NEGATIVE

## 2016-08-25 MED ORDER — ONDANSETRON HCL 4 MG PO TABS: 4.0000 mg | ORAL_TABLET | Freq: Three times a day (TID) | ORAL | 2 refills | Status: DC | PRN

## 2016-08-25 MED ORDER — HYDROCODONE-ACETAMINOPHEN 5-325 MG PO TABS: 1.0000 | ORAL_TABLET | ORAL | 0 refills | Status: DC | PRN
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
## 2016-08-25 NOTE — Telephone Encounter (Signed)
Patient will call back to reschedule appt after she has her procedure done on Friday

## 2016-08-26 ENCOUNTER — Ambulatory Visit
Admission: RE | Admit: 2016-08-26 | Discharge: 2016-08-26 | Disposition: A | Discharge status: Home / Self Care | Payer: Medicare Other | Source: Ambulatory Visit | Attending: Nurse Practitioner | Admitting: Nurse Practitioner

## 2016-08-26 DIAGNOSIS — C799 Secondary malignant neoplasm of unspecified site: Secondary | ICD-10-CM

## 2016-08-26 DIAGNOSIS — C439 Malignant melanoma of skin, unspecified: Secondary | ICD-10-CM

## 2016-08-26 DIAGNOSIS — K219 Gastro-esophageal reflux disease without esophagitis: Secondary | ICD-10-CM | POA: Diagnosis not present

## 2016-08-26 DIAGNOSIS — R1110 Vomiting, unspecified: Secondary | ICD-10-CM | POA: Insufficient documentation

## 2016-08-26 DIAGNOSIS — R1013 Epigastric pain: Secondary | ICD-10-CM

## 2016-08-26 DIAGNOSIS — Z8719 Personal history of other diseases of the digestive system: Secondary | ICD-10-CM | POA: Insufficient documentation

## 2016-08-29 NOTE — Progress Notes (Signed)
[place]  [phone] [phone]) [phone]  ID: Patricia Li OB: Jul 02, 1960  MR#: [id number]  DDU#:[id number]  Patient Care Team: Jene Every, MD as PCP - General (Family Medicine) Bary Castilla, Forest Gleason, MD as Consulting Physician (General Surgery) Forest Gleason, MD (Oncology) Lloyd Huger, MD as Consulting Physician (Oncology)  CHIEF COMPLAINT: Recurrent metastatic melanoma, BRAF+, of right lower extremity, metastatic.  INTERVAL HISTORY: Patient returns to clinic today for further evaluation and consideration of cycle 9 of maintenance nivolumab. She continues to have issues with pancreatitis, although currently she feels well and is asymptomatic. She has no neurological complaints. She denies any recent fevers or illnesses. She has a good appetite. She denies any chest pain or shortness of breath. She has no nausea, vomiting, or constipation. She has no urinary complaints. Patient offers no further specific complaints today.   REVIEW OF SYSTEMS:   Review of Systems  Constitutional: Negative.  Negative for fever, malaise/fatigue and weight loss.  HENT: Negative.  Negative for ear pain and tinnitus.   Eyes: Negative.   Respiratory: Negative for cough and shortness of breath.   Cardiovascular: Negative.  Negative for chest pain and leg swelling.  Gastrointestinal: Negative for abdominal pain, blood in stool, diarrhea and heartburn.  Genitourinary: Negative.   Musculoskeletal: Positive for joint pain.  Skin: Negative for itching and rash.  Neurological: Negative.  Negative for sensory change and weakness.  Endo/Heme/Allergies: Does not bruise/bleed easily.  Psychiatric/Behavioral: Negative.  The patient is not nervous/anxious and does not have insomnia.     As per HPI. Otherwise, a complete review of systems is negative.  PAST MEDICAL HISTORY: Past Medical History:  Diagnosis Date  . CHF (congestive heart failure) ([place]) 2016   Dr. Nehemiah Massed    . GERD (gastroesophageal reflux disease) 2012  . Hypothyroidism   . Lumbar herniated disc 2010  . Malignant melanoma of skin of lower limb, including hip ([place]) 2008, 2017   right thigh  . Personal history of malignant melanoma of skin 2012  . Tendonitis   . Tick bite of head 07/20/2015    PAST SURGICAL HISTORY: Past Surgical History:  Procedure Laterality Date  . BACK SURGERY  2010  . BREAST CYST EXCISION  1985  . COLONOSCOPY  07/2012   [facility]  . FRACTURE SURGERY     rod removed from left femur 07/21/2014  . INGUINAL LYMPH NODE BIOPSY Right 07/17/2015   Procedure: INGUINAL LYMPH NODE BIOPSY/EXCISION;  Surgeon: Robert Bellow, MD;  Location: [facility] ORS;  Service: General;  Laterality: Right;  . MELANOMA EXCISION  2008   right thigh  . PORTACATH PLACEMENT Left 03/14/2016   Procedure: INSERTION PORT-A-CATH;  Surgeon: Robert Bellow, MD;  Location: [facility] ORS;  Service: General;  Laterality: Left;  . SKIN LESION EXCISION Right 12-19-10   posterior    FAMILY HISTORY: Family History  Problem Relation Age of Onset  . COPD Father   . Cancer Sister        Colon, sister       ADVANCED DIRECTIVES:    HEALTH MAINTENANCE: Social History  Substance Use Topics  . Smoking status: Current Every Day Smoker    Packs/day: 0.50    Years: 20.00    Types: Cigarettes  . Smokeless tobacco: Never Used  . Alcohol use 0.0 oz/week     Comment: ocassionally     Colonoscopy:  PAP:  Bone density:  Lipid panel:  Allergies  Allergen Reactions  . Clindamycin Shortness Of Breath  .  Levaquin [Levofloxacin] Other (See Comments)    Joint inflammation  . Aripiprazole Other (See Comments)    Facial and head ticks  . Valproic Acid     Other reaction(s): Other (See Comments) Facial twitching    Current Outpatient Prescriptions  Medication Sig Dispense Refill  . aspirin-acetaminophen-caffeine (EXCEDRIN MIGRAINE) 250-250-65 MG tablet Take 2 tablets by mouth daily as needed for headache.     . calcium carbonate (OS-CAL) 600 MG TABS tablet Take 600 mg by mouth 2 (two) times daily with a meal.    . clonazePAM (KLONOPIN) 0.5 MG tablet Take 0.5 mg by mouth at bedtime.    . furosemide (LASIX) 20 MG tablet Take 20 mg by mouth every other day.     . gabapentin (NEURONTIN) 300 MG capsule Take 300 mg by mouth 3 (three) times daily.    Marland Kitchen HYDROcodone-acetaminophen (NORCO/VICODIN) 5-325 MG tablet Take 1 tablet by mouth every 4 (four) hours as needed for moderate pain. 60 tablet 0  . hydrocortisone (CORTEF) 10 MG tablet Take 10-15 mg by mouth 2 (two) times daily. 1.5 tablets (15 MG) in the morning and 1 tablet (10 MG) in the evening    . levothyroxine (SYNTHROID, LEVOTHROID) 112 MCG tablet Take 112 mcg by mouth daily before breakfast.     . metoprolol tartrate (LOPRESSOR) 25 MG tablet Take 12.5 mg by mouth 2 (two) times daily.     . Multiple Vitamin (MULTIVITAMIN WITH MINERALS) TABS tablet Take 1 tablet by mouth daily.    . ondansetron (ZOFRAN) 4 MG tablet Take 1 tablet (4 mg total) by mouth every 8 (eight) hours as needed for nausea or vomiting. 30 tablet 2  . pantoprazole (PROTONIX) 40 MG tablet Take 40 mg by mouth 2 (two) times daily.     . predniSONE (DELTASONE) 10 MG tablet Take 10 mg by mouth daily with breakfast.    . venlafaxine XR (EFFEXOR-XR) 150 MG 24 hr capsule 150 mg every morning.     . venlafaxine XR (EFFEXOR-XR) 75 MG 24 hr capsule Take 75 mg by mouth every evening.    . lipase/protease/amylase (CREON) 12000 units CPEP capsule Take 12,000 Units by mouth 3 (three) times daily with meals.     No current facility-administered medications for this visit.     OBJECTIVE: Vitals:   09/01/16 0958  BP: 121/85  Pulse: 70  Resp: 20  Temp: 97.7 F (36.5 C)     Body mass index is 25.95 kg/m.    ECOG FS:0 - Asymptomatic  General: Well-developed, well-nourished, no acute distress. Eyes: Pink conjunctiva, anicteric sclera. Lungs: Clear to auscultation bilaterally. Heart: Regular  rate and rhythm. No rubs, murmurs, or gallops. Abdomen: Soft, nontender, nondistended. No organomegaly noted, normoactive bowel sounds. Musculoskeletal: No edema, cyanosis, or clubbing. Mild swelling of joints, no decrease in strength. Mild tenderness to touch. Neuro: Alert, answering all questions appropriately. Cranial nerves grossly intact. Skin: No rash or petechiae noted. Psych: Normal affect.   LAB RESULTS:  Lab Results  Component Value Date   NA 134 (L) 09/01/2016   K 3.4 (L) 09/01/2016   CL 101 09/01/2016   CO2 26 09/01/2016   GLUCOSE 99 09/01/2016   BUN 18 09/01/2016   CREATININE 1.58 (H) 09/01/2016   CALCIUM 8.6 (L) 09/01/2016   PROT 6.5 09/01/2016   ALBUMIN 3.7 09/01/2016   AST 22 09/01/2016   ALT 11 (L) 09/01/2016   ALKPHOS 86 09/01/2016   BILITOT 0.4 09/01/2016   GFRNONAA 36 (L) 09/01/2016   GFRAA  42 (L) 09/01/2016    Lab Results  Component Value Date   WBC 8.1 09/01/2016   NEUTROABS 6.5 09/01/2016   HGB 11.3 (L) 09/01/2016   HCT 32.0 (L) 09/01/2016   MCV 91.0 09/01/2016   PLT 337 09/01/2016    STUDIES: US Abdomen Complete  Result Date: 08/26/2016 CLINICAL DATA:  Epigastric pain for 1 month EXAM: ABDOMEN ULTRASOUND COMPLETE COMPARISON:  08/01/2016 CT abdomen/pelvis FINDINGS: Gallbladder: No gallstones or wall thickening visualized. No sonographic Murphy sign noted by sonographer. Common bile duct: Diameter: 3.7 mm Liver: No focal lesion identified. Within normal limits in parenchymal echogenicity. IVC: No abnormality visualized. Pancreas: Visualized portion unremarkable. Spleen: Size and appearance within normal limits. Right Kidney: Length: 9.2 cm. Mild renal cortical thinning. Echogenicity within normal limits. No mass or hydronephrosis visualized. Left Kidney: Length: 9.5 cm. Echogenicity within normal limits. No mass or hydronephrosis visualized. Abdominal aorta: No aneurysm visualized. Other findings: None. IMPRESSION: 1. No acute sonographic abnormality of  the abdomen. 2. No cholelithiasis or sonographic evidence of acute cholecystitis. Electronically Signed   By: Kathreen Devoid   On: 08/26/2016 13:24   Dg Duanne Limerick W/small Bowel High Density  Result Date: 08/26/2016 CLINICAL DATA:  Epigastric abdominal pain.  Vomiting for 1 month. EXAM: UPPER GI SERIES WITH SMALL BOWEL FOLLOW-THROUGH FLUOROSCOPY TIME:  Fluoroscopy Time:  0.7 minutes Radiation Exposure Index (if provided by the fluoroscopic device): 11.2 mGy Number of Acquired Spot Images: 4 TECHNIQUE: Combined double contrast and single contrast upper GI series using effervescent crystals, thick barium, and thin barium. Subsequently, serial images of the small bowel were obtained including spot views of the terminal ileum. COMPARISON:  None. FINDINGS: KUB: There is no bowel dilatation to suggest obstruction. There is no evidence of pneumoperitoneum, portal venous gas or pneumatosis. There are no pathologic calcifications along the expected course of the ureters. The osseous structures are unremarkable. SMALL BOWEL FOLLOW-THROUGH: Examination of the esophagus demonstrated normal esophageal motility. Normal esophageal morphology without evidence of esophagitis or ulceration. No esophageal stricture, diverticula, or mass lesion. No evidence of hiatal hernia. Mild spontaneous gastroesophageal reflux. Examination of the stomach demonstrated normal rugal folds and areae gastricae. The gastric mucosa appeared unremarkable without evidence of ulceration, scarring, or mass lesion. Gastric motility and emptying was normal. Fluoroscopic examination of the duodenum demonstrates normal motility and morphology without evidence of ulceration or mass lesion. Medium density barium was periodically observed under fluoroscopy to travel from the stomach to the ascending colon (over a 60 minute time period). There is no evidence of small bowel stricture or obstruction. No large filling defects to suggest mass lesion. In addition, there is no  evidence of tethering or definite inflammatory changes present within the small bowel. IMPRESSION: 1. Mild spontaneous gastroesophageal reflux. 2. Otherwise normal upper GI and small-bowel follow-through. Electronically Signed   By: Kathreen Devoid   On: 08/26/2016 13:22    ONCOLOGIC HISTORY: Patient initially diagnosed with metastatic melanoma to right thigh with unknown primary in 2009. She received 4 weeks of high-dose interferon therapy in February 2009 and proceed with low-dose interferon therapy in March 2009. Patient remained in remission until August 2012 when she was noted to have recurrent melanoma and a pelvic lymph node. October 2012 she had 2 cycles of Ipilumimab which was discontinued secondary to poor tolerance. In April 2017 PET scan showed recurrent disease in the right inguinal region which was confirmed by biopsy. Zeboraf discontinued in November 2017 secondary to intolerable rash. Patient initiated combination immunotherapy with nivolumab and ipilumimab  on January 14, 2016. Patient initiated maintenance nivolumab on April 14, 2016.  ASSESSMENT: Recurrent metastatic melanoma, BRAF+, of right lower extremity, metastatic.  PLAN:    1. Recurrent metastatic melanoma, BRAF+, of right lower extremity, metastatic:  PET scan results from Jul 06, 2016 reviewed independently with overall improvement of disease burden. She does have a new iliac crest lesion that may be contributing to her pain and she completed palliative XRT on August 11, 2016. Given patient's persistent pancreatitis, will discontinue nivolumab. Patient has agreed to reattempt another oral medication, therefore will initiate 2 mg trametinib ([facility]inist) daily. If patient tolerates this well, can attempt combination therapy using dabrafenib/trametinib in the future. Single agent Cobimetinib is also an option. Will get a PET scan in the next 1-2 weeks prior to initiating her next treatment. Patient will follow-up in approximately 4 weeks  for further evaluation.  2. Pain: Patient states she has discontinued Plaquenil. XRT as above for left hip pain.  3. Nausea: Continue Compazine as needed. 4. Diarrhea: Continue Imodium OTC as needed. 5. Pancreatitis: Unclear etiology. Discontinue nivolumab as above.   Approximately 30 minutes was spent in discussion of which greater than 50% was consultation.  Patient expressed understanding and was in agreement with this plan. She also understands that She can call clinic at any time with any questions, concerns, or complaints.    Lloyd Huger, MD 09/01/16 2:07 PM

## 2016-08-30 DIAGNOSIS — C439 Malignant melanoma of skin, unspecified: Secondary | ICD-10-CM | POA: Diagnosis not present

## 2016-08-30 DIAGNOSIS — M2550 Pain in unspecified joint: Secondary | ICD-10-CM | POA: Diagnosis not present

## 2016-08-30 DIAGNOSIS — N183 Chronic kidney disease, stage 3 (moderate): Secondary | ICD-10-CM | POA: Diagnosis not present

## 2016-08-30 DIAGNOSIS — C779 Secondary and unspecified malignant neoplasm of lymph node, unspecified: Secondary | ICD-10-CM | POA: Diagnosis not present

## 2016-09-01 ENCOUNTER — Inpatient Hospital Stay (HOSPITAL_BASED_OUTPATIENT_CLINIC_OR_DEPARTMENT_OTHER): Payer: Medicare Other | Admitting: Oncology

## 2016-09-01 ENCOUNTER — Inpatient Hospital Stay: Payer: Medicare Other

## 2016-09-01 ENCOUNTER — Inpatient Hospital Stay: Payer: Medicare Other | Attending: Oncology

## 2016-09-01 VITALS — BP 121/85 | HR 70 | Temp 97.7°F | Resp 20 | Wt 165.7 lb

## 2016-09-01 DIAGNOSIS — R634 Abnormal weight loss: Secondary | ICD-10-CM | POA: Insufficient documentation

## 2016-09-01 DIAGNOSIS — N189 Chronic kidney disease, unspecified: Secondary | ICD-10-CM | POA: Diagnosis not present

## 2016-09-01 DIAGNOSIS — Z8719 Personal history of other diseases of the digestive system: Secondary | ICD-10-CM

## 2016-09-01 DIAGNOSIS — R197 Diarrhea, unspecified: Secondary | ICD-10-CM | POA: Insufficient documentation

## 2016-09-01 DIAGNOSIS — M25552 Pain in left hip: Secondary | ICD-10-CM | POA: Insufficient documentation

## 2016-09-01 DIAGNOSIS — F17210 Nicotine dependence, cigarettes, uncomplicated: Secondary | ICD-10-CM

## 2016-09-01 DIAGNOSIS — C439 Malignant melanoma of skin, unspecified: Secondary | ICD-10-CM

## 2016-09-01 DIAGNOSIS — K219 Gastro-esophageal reflux disease without esophagitis: Secondary | ICD-10-CM | POA: Insufficient documentation

## 2016-09-01 DIAGNOSIS — R110 Nausea: Secondary | ICD-10-CM

## 2016-09-01 DIAGNOSIS — Z79899 Other long term (current) drug therapy: Secondary | ICD-10-CM

## 2016-09-01 DIAGNOSIS — K5900 Constipation, unspecified: Secondary | ICD-10-CM | POA: Diagnosis not present

## 2016-09-01 DIAGNOSIS — E039 Hypothyroidism, unspecified: Secondary | ICD-10-CM

## 2016-09-01 DIAGNOSIS — C799 Secondary malignant neoplasm of unspecified site: Secondary | ICD-10-CM | POA: Diagnosis not present

## 2016-09-01 DIAGNOSIS — Z800 Family history of malignant neoplasm of digestive organs: Secondary | ICD-10-CM

## 2016-09-01 DIAGNOSIS — I509 Heart failure, unspecified: Secondary | ICD-10-CM | POA: Insufficient documentation

## 2016-09-01 DIAGNOSIS — C4371 Malignant melanoma of right lower limb, including hip: Secondary | ICD-10-CM | POA: Insufficient documentation

## 2016-09-01 DIAGNOSIS — D649 Anemia, unspecified: Secondary | ICD-10-CM | POA: Diagnosis not present

## 2016-09-01 DIAGNOSIS — Z5111 Encounter for antineoplastic chemotherapy: Secondary | ICD-10-CM | POA: Insufficient documentation

## 2016-09-01 DIAGNOSIS — E871 Hypo-osmolality and hyponatremia: Secondary | ICD-10-CM | POA: Insufficient documentation

## 2016-09-01 DIAGNOSIS — K859 Acute pancreatitis without necrosis or infection, unspecified: Secondary | ICD-10-CM | POA: Insufficient documentation

## 2016-09-01 LAB — CBC WITH DIFFERENTIAL/PLATELET
Basophils Absolute: 0.1 10*3/uL (ref 0–0.1)
Basophils Relative: 1 %
EOS ABS: 0.3 10*3/uL (ref 0–0.7)
Eosinophils Relative: 3 %
HCT: 32 % — ABNORMAL LOW (ref 35.0–47.0)
HEMOGLOBIN: 11.3 g/dL — AB (ref 12.0–16.0)
LYMPHS ABS: 0.7 10*3/uL — AB (ref 1.0–3.6)
LYMPHS PCT: 9 %
MCH: 32.1 pg (ref 26.0–34.0)
MCHC: 35.3 g/dL (ref 32.0–36.0)
MCV: 91 fL (ref 80.0–100.0)
MONOS PCT: 7 %
Monocytes Absolute: 0.5 10*3/uL (ref 0.2–0.9)
NEUTROS PCT: 80 %
Neutro Abs: 6.5 10*3/uL (ref 1.4–6.5)
Platelets: 337 10*3/uL (ref 150–440)
RBC: 3.51 MIL/uL — AB (ref 3.80–5.20)
RDW: 13.3 % (ref 11.5–14.5)
WBC: 8.1 10*3/uL (ref 3.6–11.0)

## 2016-09-01 LAB — COMPREHENSIVE METABOLIC PANEL
ALK PHOS: 86 U/L (ref 38–126)
ALT: 11 U/L — AB (ref 14–54)
ANION GAP: 7 (ref 5–15)
AST: 22 U/L (ref 15–41)
Albumin: 3.7 g/dL (ref 3.5–5.0)
BILIRUBIN TOTAL: 0.4 mg/dL (ref 0.3–1.2)
BUN: 18 mg/dL (ref 6–20)
CALCIUM: 8.6 mg/dL — AB (ref 8.9–10.3)
CO2: 26 mmol/L (ref 22–32)
CREATININE: 1.58 mg/dL — AB (ref 0.44–1.00)
Chloride: 101 mmol/L (ref 101–111)
GFR, EST AFRICAN AMERICAN: 42 mL/min — AB (ref >60)
GFR, EST NON AFRICAN AMERICAN: 36 mL/min — AB (ref >60)
Glucose, Bld: 99 mg/dL (ref 65–99)
Potassium: 3.4 mmol/L — ABNORMAL LOW (ref 3.5–5.1)
SODIUM: 134 mmol/L — AB (ref 135–145)
TOTAL PROTEIN: 6.5 g/dL (ref 6.5–8.1)

## 2016-09-01 LAB — LIPASE, BLOOD: LIPASE: 138 U/L — AB (ref 11–51)

## 2016-09-01 MED ORDER — HEPARIN SOD (PORK) LOCK FLUSH 100 UNIT/ML IV SOLN
500.0000 [IU] | Freq: Once | INTRAVENOUS | Status: AC
  Administered 2016-09-01: 500 [IU] via INTRAVENOUS

## 2016-09-01 MED ORDER — TRAMETINIB DIMETHYL SULFOXIDE 2 MG PO TABS: 2.0000 mg | ORAL_TABLET | Freq: Every day | ORAL | 5 refills | Status: DC

## 2016-09-01 NOTE — Progress Notes (Signed)
Patient denies any concerns today.  

## 2016-09-03 DIAGNOSIS — E039 Hypothyroidism, unspecified: Secondary | ICD-10-CM | POA: Diagnosis not present

## 2016-09-03 DIAGNOSIS — Z9221 Personal history of antineoplastic chemotherapy: Secondary | ICD-10-CM | POA: Diagnosis not present

## 2016-09-03 DIAGNOSIS — K859 Acute pancreatitis without necrosis or infection, unspecified: Secondary | ICD-10-CM | POA: Diagnosis not present

## 2016-09-03 DIAGNOSIS — K8500 Idiopathic acute pancreatitis without necrosis or infection: Secondary | ICD-10-CM | POA: Diagnosis not present

## 2016-09-03 DIAGNOSIS — R109 Unspecified abdominal pain: Secondary | ICD-10-CM | POA: Diagnosis not present

## 2016-09-03 DIAGNOSIS — R932 Abnormal findings on diagnostic imaging of liver and biliary tract: Secondary | ICD-10-CM | POA: Diagnosis not present

## 2016-09-03 DIAGNOSIS — N183 Chronic kidney disease, stage 3 (moderate): Secondary | ICD-10-CM | POA: Diagnosis not present

## 2016-09-03 DIAGNOSIS — E2740 Unspecified adrenocortical insufficiency: Secondary | ICD-10-CM | POA: Diagnosis not present

## 2016-09-03 DIAGNOSIS — K219 Gastro-esophageal reflux disease without esophagitis: Secondary | ICD-10-CM | POA: Diagnosis present

## 2016-09-03 DIAGNOSIS — F17210 Nicotine dependence, cigarettes, uncomplicated: Secondary | ICD-10-CM | POA: Diagnosis present

## 2016-09-03 DIAGNOSIS — R112 Nausea with vomiting, unspecified: Secondary | ICD-10-CM | POA: Diagnosis not present

## 2016-09-03 DIAGNOSIS — R197 Diarrhea, unspecified: Secondary | ICD-10-CM | POA: Diagnosis not present

## 2016-09-03 DIAGNOSIS — I700 Atherosclerosis of aorta: Secondary | ICD-10-CM | POA: Diagnosis not present

## 2016-09-03 DIAGNOSIS — R1110 Vomiting, unspecified: Secondary | ICD-10-CM | POA: Diagnosis not present

## 2016-09-03 DIAGNOSIS — C439 Malignant melanoma of skin, unspecified: Secondary | ICD-10-CM | POA: Diagnosis present

## 2016-09-05 ENCOUNTER — Telehealth: Payer: Self-pay | Admitting: *Deleted

## 2016-09-05 ENCOUNTER — Other Ambulatory Visit: Payer: Self-pay | Admitting: Nurse Practitioner

## 2016-09-05 DIAGNOSIS — K8500 Idiopathic acute pancreatitis without necrosis or infection: Secondary | ICD-10-CM

## 2016-09-05 NOTE — Telephone Encounter (Signed)
Opdivo to be added to patients scheduled appointments for 8/14, schedule message sent. Appointments can be moved up to sooner date if patient wishes.

## 2016-09-05 NOTE — Telephone Encounter (Signed)
I am okay with that. We will have to cancel the oral treatment that I printed on Friday.

## 2016-09-05 NOTE — Telephone Encounter (Signed)
Patient called in today to let you know that she is back in the hospital with pancreatitis. She states her lipase had increased to over 600 as of Saturday 7/7. Patient also states she might be able to restart Opdivo since she does not think this is causing her issues.

## 2016-09-06 ENCOUNTER — Ambulatory Visit
Admission: RE | Admit: 2016-09-06 | Discharge: 2016-09-06 | Disposition: A | Discharge status: Home / Self Care | Payer: Medicare Other | Source: Ambulatory Visit | Attending: Nurse Practitioner | Admitting: Nurse Practitioner

## 2016-09-06 DIAGNOSIS — I700 Atherosclerosis of aorta: Secondary | ICD-10-CM | POA: Diagnosis not present

## 2016-09-06 DIAGNOSIS — K8500 Idiopathic acute pancreatitis without necrosis or infection: Secondary | ICD-10-CM | POA: Diagnosis not present

## 2016-09-06 DIAGNOSIS — R932 Abnormal findings on diagnostic imaging of liver and biliary tract: Secondary | ICD-10-CM | POA: Diagnosis not present

## 2016-09-06 MED ORDER — GADOBENATE DIMEGLUMINE 529 MG/ML IV SOLN
10.0000 mL | Freq: Once | INTRAVENOUS | Status: AC | PRN
  Administered 2016-09-06: 7 mL via INTRAVENOUS

## 2016-09-08 ENCOUNTER — Ambulatory Visit: Payer: Medicare Other | Admitting: Radiation Oncology

## 2016-09-13 DIAGNOSIS — R197 Diarrhea, unspecified: Secondary | ICD-10-CM | POA: Diagnosis not present

## 2016-09-14 ENCOUNTER — Encounter: Payer: Self-pay | Admitting: Oncology

## 2016-09-19 ENCOUNTER — Ambulatory Visit: Payer: Medicare Other | Admitting: Radiation Oncology

## 2016-09-19 DIAGNOSIS — R197 Diarrhea, unspecified: Secondary | ICD-10-CM | POA: Diagnosis not present

## 2016-09-19 DIAGNOSIS — K5900 Constipation, unspecified: Secondary | ICD-10-CM | POA: Diagnosis not present

## 2016-09-19 DIAGNOSIS — R1084 Generalized abdominal pain: Secondary | ICD-10-CM | POA: Insufficient documentation

## 2016-09-19 DIAGNOSIS — R1032 Left lower quadrant pain: Secondary | ICD-10-CM | POA: Diagnosis not present

## 2016-09-21 DIAGNOSIS — R1032 Left lower quadrant pain: Secondary | ICD-10-CM | POA: Insufficient documentation

## 2016-09-21 DIAGNOSIS — K5900 Constipation, unspecified: Secondary | ICD-10-CM | POA: Insufficient documentation

## 2016-09-21 DIAGNOSIS — R197 Diarrhea, unspecified: Secondary | ICD-10-CM | POA: Insufficient documentation

## 2016-09-23 ENCOUNTER — Inpatient Hospital Stay: Payer: Medicare Other

## 2016-09-23 ENCOUNTER — Ambulatory Visit: Payer: Medicare Other

## 2016-09-23 ENCOUNTER — Inpatient Hospital Stay (HOSPITAL_BASED_OUTPATIENT_CLINIC_OR_DEPARTMENT_OTHER): Payer: Medicare Other | Admitting: Oncology

## 2016-09-23 ENCOUNTER — Other Ambulatory Visit: Payer: Self-pay | Admitting: *Deleted

## 2016-09-23 DIAGNOSIS — K5900 Constipation, unspecified: Secondary | ICD-10-CM | POA: Diagnosis not present

## 2016-09-23 DIAGNOSIS — R110 Nausea: Secondary | ICD-10-CM

## 2016-09-23 DIAGNOSIS — E871 Hypo-osmolality and hyponatremia: Secondary | ICD-10-CM

## 2016-09-23 DIAGNOSIS — C439 Malignant melanoma of skin, unspecified: Secondary | ICD-10-CM

## 2016-09-23 DIAGNOSIS — C4371 Malignant melanoma of right lower limb, including hip: Secondary | ICD-10-CM | POA: Diagnosis not present

## 2016-09-23 DIAGNOSIS — F17210 Nicotine dependence, cigarettes, uncomplicated: Secondary | ICD-10-CM

## 2016-09-23 DIAGNOSIS — Z8719 Personal history of other diseases of the digestive system: Secondary | ICD-10-CM

## 2016-09-23 DIAGNOSIS — N189 Chronic kidney disease, unspecified: Secondary | ICD-10-CM | POA: Diagnosis not present

## 2016-09-23 DIAGNOSIS — D649 Anemia, unspecified: Secondary | ICD-10-CM | POA: Diagnosis not present

## 2016-09-23 DIAGNOSIS — C4370 Malignant melanoma of unspecified lower limb, including hip: Secondary | ICD-10-CM

## 2016-09-23 DIAGNOSIS — I509 Heart failure, unspecified: Secondary | ICD-10-CM | POA: Diagnosis not present

## 2016-09-23 DIAGNOSIS — E039 Hypothyroidism, unspecified: Secondary | ICD-10-CM

## 2016-09-23 DIAGNOSIS — C799 Secondary malignant neoplasm of unspecified site: Secondary | ICD-10-CM

## 2016-09-23 DIAGNOSIS — Z800 Family history of malignant neoplasm of digestive organs: Secondary | ICD-10-CM

## 2016-09-23 DIAGNOSIS — R197 Diarrhea, unspecified: Secondary | ICD-10-CM | POA: Diagnosis not present

## 2016-09-23 DIAGNOSIS — Z5111 Encounter for antineoplastic chemotherapy: Secondary | ICD-10-CM | POA: Diagnosis not present

## 2016-09-23 DIAGNOSIS — R634 Abnormal weight loss: Secondary | ICD-10-CM | POA: Diagnosis not present

## 2016-09-23 DIAGNOSIS — K859 Acute pancreatitis without necrosis or infection, unspecified: Secondary | ICD-10-CM | POA: Diagnosis not present

## 2016-09-23 DIAGNOSIS — K219 Gastro-esophageal reflux disease without esophagitis: Secondary | ICD-10-CM

## 2016-09-23 DIAGNOSIS — M25552 Pain in left hip: Secondary | ICD-10-CM | POA: Diagnosis not present

## 2016-09-23 DIAGNOSIS — Z79899 Other long term (current) drug therapy: Secondary | ICD-10-CM

## 2016-09-23 LAB — CBC WITH DIFFERENTIAL/PLATELET
BASOS ABS: 0.1 10*3/uL (ref 0–0.1)
BASOS PCT: 1 %
EOS ABS: 0.3 10*3/uL (ref 0–0.7)
EOS PCT: 4 %
HCT: 33.4 % — ABNORMAL LOW (ref 35.0–47.0)
Hemoglobin: 11.3 g/dL — ABNORMAL LOW (ref 12.0–16.0)
Lymphocytes Relative: 7 %
Lymphs Abs: 0.6 10*3/uL — ABNORMAL LOW (ref 1.0–3.6)
MCH: 30.9 pg (ref 26.0–34.0)
MCHC: 33.8 g/dL (ref 32.0–36.0)
MCV: 91.3 fL (ref 80.0–100.0)
Monocytes Absolute: 0.4 10*3/uL (ref 0.2–0.9)
Monocytes Relative: 4 %
Neutro Abs: 7 10*3/uL — ABNORMAL HIGH (ref 1.4–6.5)
Neutrophils Relative %: 84 %
PLATELETS: 349 10*3/uL (ref 150–440)
RBC: 3.66 MIL/uL — AB (ref 3.80–5.20)
RDW: 14.1 % (ref 11.5–14.5)
WBC: 8.3 10*3/uL (ref 3.6–11.0)

## 2016-09-23 LAB — COMPREHENSIVE METABOLIC PANEL
ALBUMIN: 3.5 g/dL (ref 3.5–5.0)
ALT: 16 U/L (ref 14–54)
AST: 44 U/L — AB (ref 15–41)
Alkaline Phosphatase: 156 U/L — ABNORMAL HIGH (ref 38–126)
Anion gap: 8 (ref 5–15)
BUN: 17 mg/dL (ref 6–20)
CHLORIDE: 99 mmol/L — AB (ref 101–111)
CO2: 25 mmol/L (ref 22–32)
CREATININE: 2 mg/dL — AB (ref 0.44–1.00)
Calcium: 8.1 mg/dL — ABNORMAL LOW (ref 8.9–10.3)
GFR calc non Af Amer: 27 mL/min — ABNORMAL LOW (ref >60)
GFR, EST AFRICAN AMERICAN: 31 mL/min — AB (ref >60)
Glucose, Bld: 218 mg/dL — ABNORMAL HIGH (ref 65–99)
Potassium: 4.7 mmol/L (ref 3.5–5.1)
SODIUM: 132 mmol/L — AB (ref 135–145)
Total Bilirubin: 0.5 mg/dL (ref 0.3–1.2)
Total Protein: 5.9 g/dL — ABNORMAL LOW (ref 6.5–8.1)

## 2016-09-23 MED ORDER — NIVOLUMAB CHEMO INJECTION 100 MG/10ML
240.0000 mg | Freq: Once | INTRAVENOUS | Status: AC
  Administered 2016-09-23: 240 mg via INTRAVENOUS
  Filled 2016-09-23: qty 24

## 2016-09-23 MED ORDER — SODIUM CHLORIDE 0.9 % IV SOLN
Freq: Once | INTRAVENOUS | Status: AC
  Administered 2016-09-23: 15:00:00 via INTRAVENOUS
  Filled 2016-09-23: qty 1000

## 2016-09-23 MED ORDER — HEPARIN SOD (PORK) LOCK FLUSH 100 UNIT/ML IV SOLN
500.0000 [IU] | Freq: Once | INTRAVENOUS | Status: AC | PRN
  Administered 2016-09-23: 500 [IU]

## 2016-09-23 NOTE — Progress Notes (Signed)
[place]  [phone] [phone]  ID: Patricia Li OB: 1960/06/12  MR#: [id number]  AUQ#:[id number]  Patient Care Team: Jene Every, MD as PCP - General (Family Medicine) Bary Castilla, Forest Gleason, MD as Consulting Physician (General Surgery) Forest Gleason, MD (Oncology) Lloyd Huger, MD as Consulting Physician (Oncology)  CHIEF COMPLAINT: Recurrent metastatic melanoma, BRAF+, of right lower extremity, metastatic.  INTERVAL HISTORY: Patient returns to clinic today for further evaluation and consideration of cycle 10 of maintenance nivolumab. She continues to have issues with pancreatitis. She was recently admitted and treated for pancreatitis on 09/03/16 and has recovered. She also has constipation which she saw Denice Paradise for and  was instructed to take Mag citrate. She states this has worked well for her. She complains of joint pain. She admits to weight loss due to diarrhea and her pancreatitis where she ate very little. Her appetite has improved.  She has no neurological complaints. She denies any chest pain or shortness of breath. She has no nausea or vomiting. She has no urinary complaints. Patient offers no further specific complaints today.   REVIEW OF SYSTEMS:   Review of Systems  Constitutional: Positive for weight loss. Negative for fever and malaise/fatigue.  HENT: Negative.  Negative for ear pain and tinnitus.   Eyes: Negative.   Respiratory: Negative for cough and shortness of breath.   Cardiovascular: Negative.  Negative for chest pain and leg swelling.  Gastrointestinal: Positive for constipation and diarrhea. Negative for abdominal pain, blood in stool and heartburn.  Genitourinary: Negative.   Musculoskeletal: Positive for joint pain.  Skin: Negative for itching and rash.  Neurological: Negative.  Negative for sensory change and weakness.  Endo/Heme/Allergies: Does not bruise/bleed easily.  Psychiatric/Behavioral:  Negative.  The patient is not nervous/anxious and does not have insomnia.     As per HPI. Otherwise, a complete review of systems is negative.  PAST MEDICAL HISTORY: Past Medical History:  Diagnosis Date  . CHF (congestive heart failure) ([place]) 2016   Dr. Nehemiah Massed  . GERD (gastroesophageal reflux disease) 2012  . Hypothyroidism   . Lumbar herniated disc 2010  . Malignant melanoma of skin of lower limb, including hip ([place]) 2008, 2017   right thigh  . Personal history of malignant melanoma of skin 2012  . Tendonitis   . Tick bite of head 07/20/2015    PAST SURGICAL HISTORY: Past Surgical History:  Procedure Laterality Date  . BACK SURGERY  2010  . BREAST CYST EXCISION  1985  . COLONOSCOPY  07/2012   [facility]  . FRACTURE SURGERY     rod removed from left femur 07/21/2014  . INGUINAL LYMPH NODE BIOPSY Right 07/17/2015   Procedure: INGUINAL LYMPH NODE BIOPSY/EXCISION;  Surgeon: Robert Bellow, MD;  Location: [facility] ORS;  Service: General;  Laterality: Right;  . MELANOMA EXCISION  2008   right thigh  . PORTACATH PLACEMENT Left 03/14/2016   Procedure: INSERTION PORT-A-CATH;  Surgeon: Robert Bellow, MD;  Location: [facility] ORS;  Service: General;  Laterality: Left;  . SKIN LESION EXCISION Right 12-19-10   posterior    FAMILY HISTORY: Family History  Problem Relation Age of Onset  . COPD Father   . Cancer Sister        Colon, sister       ADVANCED DIRECTIVES:    HEALTH MAINTENANCE: Social History  Substance Use Topics  . Smoking status: Current Every Day Smoker    Packs/day: 0.50    Years: 20.00  Types: Cigarettes  . Smokeless tobacco: Never Used  . Alcohol use 0.0 oz/week     Comment: ocassionally     Colonoscopy:  PAP:  Bone density:  Lipid panel:  Allergies  Allergen Reactions  . Clindamycin Shortness Of Breath  . Levaquin [Levofloxacin] Other (See Comments)    Joint inflammation  . Aripiprazole Other (See Comments)    Facial and head ticks  . Valproic  Acid     Other reaction(s): Other (See Comments) Facial twitching    Current Outpatient Prescriptions  Medication Sig Dispense Refill  . aspirin-acetaminophen-caffeine (EXCEDRIN MIGRAINE) 250-250-65 MG tablet Take 2 tablets by mouth daily as needed for headache.    . calcium carbonate (OS-CAL) 600 MG TABS tablet Take 600 mg by mouth 2 (two) times daily with a meal.    . clonazePAM (KLONOPIN) 0.5 MG tablet Take 0.5 mg by mouth at bedtime.    . furosemide (LASIX) 20 MG tablet Take 20 mg by mouth every other day.     . gabapentin (NEURONTIN) 300 MG capsule Take 300 mg by mouth 3 (three) times daily.    Marland Kitchen HYDROcodone-acetaminophen (NORCO/VICODIN) 5-325 MG tablet Take 1 tablet by mouth every 4 (four) hours as needed for moderate pain. 60 tablet 0  . hydrocortisone (CORTEF) 10 MG tablet Take 10-15 mg by mouth 2 (two) times daily. 1.5 tablets (15 MG) in the morning and 1 tablet (10 MG) in the evening    . levothyroxine (SYNTHROID, LEVOTHROID) 112 MCG tablet Take 112 mcg by mouth daily before breakfast.     . lipase/protease/amylase (CREON) 12000 units CPEP capsule Take 12,000 Units by mouth 3 (three) times daily with meals.    . metoprolol tartrate (LOPRESSOR) 25 MG tablet Take 12.5 mg by mouth 2 (two) times daily.     . Multiple Vitamin (MULTIVITAMIN WITH MINERALS) TABS tablet Take 1 tablet by mouth daily.    . ondansetron (ZOFRAN) 4 MG tablet Take 1 tablet (4 mg total) by mouth every 8 (eight) hours as needed for nausea or vomiting. 30 tablet 2  . pantoprazole (PROTONIX) 40 MG tablet Take 40 mg by mouth 2 (two) times daily.     . predniSONE (DELTASONE) 10 MG tablet Take 10 mg by mouth daily with breakfast.    . trametinib dimethyl sulfoxide (MEKINIST) 2 MG tablet Take 1 tablet (2 mg total) by mouth daily. Take 1 hour before or 2 hours after a meal. Store refrigerated in original container. 30 tablet 5  . venlafaxine XR (EFFEXOR-XR) 150 MG 24 hr capsule 150 mg every morning.     . venlafaxine XR  (EFFEXOR-XR) 75 MG 24 hr capsule Take 75 mg by mouth every evening.     No current facility-administered medications for this visit.    Facility-Administered Medications Ordered in Other Visits  Medication Dose Route Frequency Provider Last Rate Last Dose  . 0.9 %  sodium chloride infusion   Intravenous Once Lloyd Huger, MD   Stopped at 09/23/16 1530    OBJECTIVE: There were no vitals filed for this visit.   There is no height or weight on file to calculate BMI.    ECOG FS:0 - Asymptomatic  General: Well-developed, well-nourished, no acute distress. Eyes: Pink conjunctiva, anicteric sclera. Lungs: Clear to auscultation bilaterally. Heart: Regular rate and rhythm. No rubs, murmurs, or gallops. Abdomen: Soft, nontender, nondistended. No organomegaly noted, normoactive bowel sounds. Musculoskeletal: No edema, cyanosis, or clubbing. Mild swelling of joints, no decrease in strength. Mild tenderness to touch. Neuro:  Alert, answering all questions appropriately. Cranial nerves grossly intact. Skin: No rash or petechiae noted. Psych: Normal affect.   LAB RESULTS:  Lab Results  Component Value Date   NA 132 (L) 09/23/2016   K 4.7 09/23/2016   CL 99 (L) 09/23/2016   CO2 25 09/23/2016   GLUCOSE 218 (H) 09/23/2016   BUN 17 09/23/2016   CREATININE 2.00 (H) 09/23/2016   CALCIUM 8.1 (L) 09/23/2016   PROT 5.9 (L) 09/23/2016   ALBUMIN 3.5 09/23/2016   AST 44 (H) 09/23/2016   ALT 16 09/23/2016   ALKPHOS 156 (H) 09/23/2016   BILITOT 0.5 09/23/2016   GFRNONAA 27 (L) 09/23/2016   GFRAA 31 (L) 09/23/2016    Lab Results  Component Value Date   WBC 8.3 09/23/2016   NEUTROABS 7.0 (H) 09/23/2016   HGB 11.3 (L) 09/23/2016   HCT 33.4 (L) 09/23/2016   MCV 91.3 09/23/2016   PLT 349 09/23/2016    STUDIES: US Abdomen Complete  Result Date: 08/26/2016 CLINICAL DATA:  Epigastric pain for 1 month EXAM: ABDOMEN ULTRASOUND COMPLETE COMPARISON:  08/01/2016 CT abdomen/pelvis FINDINGS:  Gallbladder: No gallstones or wall thickening visualized. No sonographic Murphy sign noted by sonographer. Common bile duct: Diameter: 3.7 mm Liver: No focal lesion identified. Within normal limits in parenchymal echogenicity. IVC: No abnormality visualized. Pancreas: Visualized portion unremarkable. Spleen: Size and appearance within normal limits. Right Kidney: Length: 9.2 cm. Mild renal cortical thinning. Echogenicity within normal limits. No mass or hydronephrosis visualized. Left Kidney: Length: 9.5 cm. Echogenicity within normal limits. No mass or hydronephrosis visualized. Abdominal aorta: No aneurysm visualized. Other findings: None. IMPRESSION: 1. No acute sonographic abnormality of the abdomen. 2. No cholelithiasis or sonographic evidence of acute cholecystitis. Electronically Signed   By: Kathreen Devoid   On: 08/26/2016 13:24   Dg Duanne Limerick W/small Bowel High Density  Addendum Date: 09/05/2016   ADDENDUM REPORT: 09/05/2016 07:44 ADDENDUM: No barium tablet was administered because the esophagus was widely patent without focal narrowing. No delay in transit of thick or thin barium was observed. Electronically Signed   By: Kathreen Devoid   On: 09/05/2016 07:44   Result Date: 09/05/2016 CLINICAL DATA:  Epigastric abdominal pain.  Vomiting for 1 month. EXAM: UPPER GI SERIES WITH SMALL BOWEL FOLLOW-THROUGH FLUOROSCOPY TIME:  Fluoroscopy Time:  0.7 minutes Radiation Exposure Index (if provided by the fluoroscopic device): 11.2 mGy Number of Acquired Spot Images: 4 TECHNIQUE: Combined double contrast and single contrast upper GI series using effervescent crystals, thick barium, and thin barium. Subsequently, serial images of the small bowel were obtained including spot views of the terminal ileum. COMPARISON:  None. FINDINGS: KUB: There is no bowel dilatation to suggest obstruction. There is no evidence of pneumoperitoneum, portal venous gas or pneumatosis. There are no pathologic calcifications along the expected  course of the ureters. The osseous structures are unremarkable. SMALL BOWEL FOLLOW-THROUGH: Examination of the esophagus demonstrated normal esophageal motility. Normal esophageal morphology without evidence of esophagitis or ulceration. No esophageal stricture, diverticula, or mass lesion. No evidence of hiatal hernia. Mild spontaneous gastroesophageal reflux. Examination of the stomach demonstrated normal rugal folds and areae gastricae. The gastric mucosa appeared unremarkable without evidence of ulceration, scarring, or mass lesion. Gastric motility and emptying was normal. Fluoroscopic examination of the duodenum demonstrates normal motility and morphology without evidence of ulceration or mass lesion. Medium density barium was periodically observed under fluoroscopy to travel from the stomach to the ascending colon (over a 60 minute time period). There is no evidence  of small bowel stricture or obstruction. No large filling defects to suggest mass lesion. In addition, there is no evidence of tethering or definite inflammatory changes present within the small bowel. IMPRESSION: 1. Mild spontaneous gastroesophageal reflux. 2. Otherwise normal upper GI and small-bowel follow-through. Electronically Signed: By: Kathreen Devoid On: 08/26/2016 13:22   Mr Abdomen Mrcp W Wo Contast  Result Date: 09/06/2016 CLINICAL DATA:  Idiopathic acute pancreatitis. Nausea. History of melanoma. EXAM: MRI ABDOMEN WITHOUT AND WITH CONTRAST (INCLUDING MRCP) TECHNIQUE: Multiplanar multisequence MR imaging of the abdomen was performed both before and after the administration of intravenous contrast. Heavily T2-weighted images of the biliary and pancreatic ducts were obtained, and three-dimensional MRCP images were rendered by post processing. CONTRAST:  7 cc MultiHance IV. COMPARISON:  09/03/2016 CT abdomen/ pelvis. FINDINGS: Lower chest: No acute abnormality at the lung bases. Hepatobiliary: Normal liver size and configuration. No  hepatic steatosis. No liver mass. Gallbladder is mildly distended and sludge filled. No gallstones. No definite gallbladder wall thickening. Trace pericholecystic fluid. No biliary ductal dilatation. Common bile duct diameter 5 mm. No evidence of choledocholithiasis. There is smooth extrinsic narrowing of the intrapancreatic portion of the lower common bile duct due to the pancreatic parenchymal edema. No evidence of biliary or ampullary mass. Pancreas: Mild diffuse pancreatic parenchymal and peripancreatic edema, decreased since 09/03/2016 CT study, compatible with improving acute pancreatitis. No measurable peripancreatic fluid collections. No pancreatic mass. No pancreatic duct dilation. The pancreatic ducts are diminutive and not visualized in the pancreatic head. As such, pancreas divisum cannot be excluded on the basis of this scan. Spleen: Normal size. No mass. Adrenals/Urinary Tract: Normal adrenals. No hydronephrosis. Normal kidneys with no renal mass. Stomach/Bowel: Grossly normal stomach. There is mild circumferential wall thickening of the second portion of the duodenum with surrounding trace free fluid, not appreciably changed since 09/03/2016 CT abdomen study, compatible with mild reactive duodenitis. Otherwise normal caliber visualized small and large bowel with no additional sites of bowel wall thickening. Vascular/Lymphatic: Atherosclerotic nonaneurysmal abdominal aorta. Patent portal, splenic, hepatic and renal veins. No pathologically enlarged lymph nodes in the abdomen. Other: No abdominal ascites or focal fluid collection. Musculoskeletal: No aggressive appearing focal osseous lesions. IMPRESSION: 1. Improving acute pancreatitis. No evidence of necrotizing pancreatitis. No peripancreatic fluid collections. 2. No pancreatic or biliary ductal dilatation. No cholelithiasis. No choledocholithiasis. No evidence of biliary or pancreatic mass. 3. Wall thickening in the descending duodenum with mild  peri-duodenal and pericholecystic fluid, favored to be reactive to the pancreatitis. 4.  Aortic Atherosclerosis (ICD10-I70.0). Electronically Signed   By: Ilona Sorrel M.D.   On: 09/06/2016 11:59    ONCOLOGIC HISTORY: Patient initially diagnosed with metastatic melanoma to right thigh with unknown primary in 2009. She received 4 weeks of high-dose interferon therapy in February 2009 and proceed with low-dose interferon therapy in March 2009. Patient remained in remission until August 2012 when she was noted to have recurrent melanoma and a pelvic lymph node. October 2012 she had 2 cycles of Ipilumimab which was discontinued secondary to poor tolerance. In April 2017 PET scan showed recurrent disease in the right inguinal region which was confirmed by biopsy. Zeboraf discontinued in November 2017 secondary to intolerable rash. Patient initiated combination immunotherapy with nivolumab and ipilumimab on January 14, 2016. Patient initiated maintenance nivolumab on April 14, 2016.  ASSESSMENT: Recurrent metastatic melanoma, BRAF+, of right lower extremity, metastatic.  PLAN:    1. Recurrent metastatic melanoma, BRAF+, of right lower extremity, metastatic:  PET scan results from  Jul 06, 2016 reviewed independently with overall improvement of disease burden. She does have a new iliac crest lesion that may be contributing to her pain and she completed palliative XRT on August 11, 2016.  Proceed with cycle 10 of Nivolumab. Patient canceled her PET because of diarrhea. Need to re-scheduled. Patient will follow-up in approximately 2 weeks for cycle 11. 2. Pain: Patient states she has discontinued Plaquenil. XRT as above for left hip pain.  3. Nausea: Continue Compazine as needed. Better this week. 4. Diarrhea: Resolved.  5. Constipation: Taking Mag citrate.  6. Pancreatitis: Dr. Grayland Ormond and patient discussed and do not think it had anything to do with the nivolumab. She would like to continue with her  treatment as previously planned. She never began the oral chemo.  7. Appetite/weight loss: She has lost 10 lbs in one month. She states this is because of the diarrhea and pancreatitis. Continue to monitor. States that she is eating well.  8. Hyponatremia: Na 132 today.  9. Elevated alkaline phosphatase: Monitor. Recent pancreatitis and hx of RA. 10. Anemia: Stable. 11. CKD: Stable.    Approximately 30 minutes was spent in discussion of which greater than 50% was consultation.  Patient expressed understanding and was in agreement with this plan. She also understands that She can call clinic at any time with any questions, concerns, or complaints.    Jacquelin Hawking, NP 09/23/16 3:28 PM

## 2016-10-02 ENCOUNTER — Encounter: Payer: Self-pay | Admitting: Oncology

## 2016-10-04 ENCOUNTER — Other Ambulatory Visit: Payer: Self-pay | Admitting: *Deleted

## 2016-10-04 ENCOUNTER — Encounter: Payer: Self-pay | Admitting: Oncology

## 2016-10-04 MED ORDER — PREDNISONE 10 MG PO TABS: 10.0000 mg | ORAL_TABLET | Freq: Every day | ORAL | 0 refills | Status: DC

## 2016-10-06 ENCOUNTER — Ambulatory Visit
Admission: RE | Admit: 2016-10-06 | Discharge: 2016-10-06 | Disposition: A | Discharge status: Home / Self Care | Payer: Medicare Other | Source: Ambulatory Visit | Attending: Oncology | Admitting: Oncology

## 2016-10-06 ENCOUNTER — Ambulatory Visit
Admission: RE | Admit: 2016-10-06 | Discharge: 2016-10-06 | Disposition: A | Discharge status: Home / Self Care | Payer: Medicare Other | Source: Ambulatory Visit | Attending: Radiation Oncology | Admitting: Radiation Oncology

## 2016-10-06 ENCOUNTER — Encounter: Payer: Self-pay | Admitting: Radiation Oncology

## 2016-10-06 VITALS — BP 141/86 | HR 56 | Temp 96.6°F | Resp 20 | Wt 159.8 lb

## 2016-10-06 DIAGNOSIS — C7951 Secondary malignant neoplasm of bone: Secondary | ICD-10-CM | POA: Diagnosis not present

## 2016-10-06 DIAGNOSIS — Z923 Personal history of irradiation: Secondary | ICD-10-CM | POA: Diagnosis not present

## 2016-10-06 DIAGNOSIS — I2510 Atherosclerotic heart disease of native coronary artery without angina pectoris: Secondary | ICD-10-CM | POA: Insufficient documentation

## 2016-10-06 DIAGNOSIS — C799 Secondary malignant neoplasm of unspecified site: Secondary | ICD-10-CM | POA: Insufficient documentation

## 2016-10-06 DIAGNOSIS — C499 Malignant neoplasm of connective and soft tissue, unspecified: Secondary | ICD-10-CM | POA: Insufficient documentation

## 2016-10-06 DIAGNOSIS — C439 Malignant melanoma of skin, unspecified: Secondary | ICD-10-CM | POA: Diagnosis not present

## 2016-10-06 LAB — GLUCOSE, CAPILLARY: Glucose-Capillary: 80 mg/dL (ref 65–99)

## 2016-10-06 MED ORDER — FLUDEOXYGLUCOSE F - 18 (FDG) INJECTION
12.0000 | Freq: Once | INTRAVENOUS | Status: AC | PRN
  Administered 2016-10-06: 12.85 via INTRAVENOUS

## 2016-10-06 NOTE — Progress Notes (Signed)
Radiation Oncology Follow up Note  Name: Patricia Li   Date:   10/06/2016 MRN:  [id number] DOB: May 17, 1960    This 56 y.o. female presents to the clinic today for two-month follow-up status post palliative radiation therapy to her sacral for metastatic stage IV malignant melanoma.  REFERRING PROVIDER: Jene Every, MD  HPI: patient is a 56 year old female now out 2 months having completed palliative radiation therapy to her sacrum for metastatic malignant melanoma. She seen today in routine follow-up is doing fairly well. She is been treated with.maintenance nivolumabalthough this is caused several episodes of pancreatitis use recently been switched to trametinib . She's having no back pain she is ambulating well without assistance and no new areas of pain described.  COMPLICATIONS OF TREATMENT: none  FOLLOW UP COMPLIANCE: keeps appointments   PHYSICAL EXAM:  BP (!) 141/86   Pulse (!) 56   Temp (!) 96.6 F (35.9 C)   Resp 20   Wt 159 lb 13.3 oz (72.5 kg)   BMI 25.03 kg/m  Range of motion of her lower extremities does not elicit pain motor sensory and DTR levels are equal and symmetric in the lower extremities deep palpation of her sacrum does not elicit pain. Well-developed well-nourished patient in NAD. HEENT reveals PERLA, EOMI, discs not visualized.  Oral cavity is clear. No oral mucosal lesions are identified. Neck is clear without evidence of cervical or supraclavicular adenopathy. Lungs are clear to A&P. Cardiac examination is essentially unremarkable with regular rate and rhythm without murmur rub or thrill. Abdomen is benign with no organomegaly or masses noted. Motor sensory and DTR levels are equal and symmetric in the upper and lower extremities. Cranial nerves II through XII are grossly intact. Proprioception is intact. No peripheral adenopathy or edema is identified. No motor or sensory levels are noted. Crude visual fields are within normal range.  RADIOLOGY RESULTS: no  current films for review  PLAN: present time she is obtained excellent palliative benefit from radiation therapy. I will turn follow-up care over to medical oncology. I would be happy to reevaluate patient at any time should further palliative treatment be indicated.  I would like to take this opportunity to thank you for allowing me to participate in the care of your patient.Armstead Peaks., MD

## 2016-10-09 NOTE — Progress Notes (Signed)
[place]  [phone] [phone]  ID: Patricia Li OB: 04/24/1960  MR#: [id number]  LID#:[id number]  Patient Care Team: Jene Every, MD as PCP - General (Family Medicine) Bary Castilla, Forest Gleason, MD as Consulting Physician (General Surgery) Forest Gleason, MD (Oncology) Lloyd Huger, MD as Consulting Physician (Oncology)  CHIEF COMPLAINT: Recurrent metastatic melanoma, BRAF+, of right lower extremity, metastatic.  INTERVAL HISTORY: Patient returns to clinic today for further evaluation, discussion of her imaging results, and consideration of and consideration of cycle 10 of maintenance nivolumab. She continues to have issues with pancreatitis, although currently she feels well and is asymptomatic. She has no neurological complaints. She denies any recent fevers or illnesses. She has a good appetite. She denies any chest pain or shortness of breath. She has no nausea, vomiting, or constipation. She has no urinary complaints. Patient offers no further specific complaints today.   REVIEW OF SYSTEMS:   Review of Systems  Constitutional: Negative.  Negative for fever, malaise/fatigue and weight loss.  HENT: Negative.  Negative for ear pain and tinnitus.   Eyes: Negative.   Respiratory: Negative for cough and shortness of breath.   Cardiovascular: Negative.  Negative for chest pain and leg swelling.  Gastrointestinal: Negative for abdominal pain, blood in stool, diarrhea and heartburn.  Genitourinary: Negative.   Musculoskeletal: Positive for joint pain.  Skin: Negative for itching and rash.  Neurological: Negative.  Negative for sensory change and weakness.  Endo/Heme/Allergies: Does not bruise/bleed easily.  Psychiatric/Behavioral: Negative.  The patient is not nervous/anxious and does not have insomnia.     As per HPI. Otherwise, a complete review of systems is negative.  PAST MEDICAL HISTORY: Past Medical History:  Diagnosis Date  .  CHF (congestive heart failure) ([place]) 2016   Dr. Nehemiah Massed  . GERD (gastroesophageal reflux disease) 2012  . Hypothyroidism   . Lumbar herniated disc 2010  . Malignant melanoma of skin of lower limb, including hip ([place]) 2008, 2017   right thigh  . Personal history of malignant melanoma of skin 2012  . Tendonitis   . Tick bite of head 07/20/2015    PAST SURGICAL HISTORY: Past Surgical History:  Procedure Laterality Date  . BACK SURGERY  2010  . BREAST CYST EXCISION  1985  . COLONOSCOPY  07/2012   [facility]  . FRACTURE SURGERY     rod removed from left femur 07/21/2014  . INGUINAL LYMPH NODE BIOPSY Right 07/17/2015   Procedure: INGUINAL LYMPH NODE BIOPSY/EXCISION;  Surgeon: Robert Bellow, MD;  Location: [facility] ORS;  Service: General;  Laterality: Right;  . MELANOMA EXCISION  2008   right thigh  . PORTACATH PLACEMENT Left 03/14/2016   Procedure: INSERTION PORT-A-CATH;  Surgeon: Robert Bellow, MD;  Location: [facility] ORS;  Service: General;  Laterality: Left;  . SKIN LESION EXCISION Right 12-19-10   posterior    FAMILY HISTORY: Family History  Problem Relation Age of Onset  . COPD Father   . Cancer Sister        Colon, sister       ADVANCED DIRECTIVES:    HEALTH MAINTENANCE: Social History  Substance Use Topics  . Smoking status: Current Every Day Smoker    Packs/day: 0.50    Years: 20.00    Types: Cigarettes  . Smokeless tobacco: Never Used  . Alcohol use 0.0 oz/week     Comment: ocassionally     Colonoscopy:  PAP:  Bone density:  Lipid panel:  Allergies  Allergen Reactions  .  Clindamycin Shortness Of Breath  . Levaquin [Levofloxacin] Other (See Comments)    Joint inflammation  . Aripiprazole Other (See Comments)    Facial and head ticks  . Valproic Acid     Other reaction(s): Other (See Comments) Facial twitching    Current Outpatient Prescriptions  Medication Sig Dispense Refill  . aspirin-acetaminophen-caffeine (EXCEDRIN MIGRAINE) 250-250-65 MG tablet  Take 2 tablets by mouth daily as needed for headache.    . calcium carbonate (OS-CAL) 600 MG TABS tablet Take 600 mg by mouth 2 (two) times daily with a meal.    . clonazePAM (KLONOPIN) 0.5 MG tablet Take 0.5 mg by mouth at bedtime.    . furosemide (LASIX) 20 MG tablet Take 20 mg by mouth every other day.     . gabapentin (NEURONTIN) 300 MG capsule Take 300 mg by mouth 3 (three) times daily.    Marland Kitchen HYDROcodone-acetaminophen (NORCO/VICODIN) 5-325 MG tablet Take 1 tablet by mouth every 4 (four) hours as needed for moderate pain. 60 tablet 0  . hydrocortisone (CORTEF) 10 MG tablet Take 10-15 mg by mouth 2 (two) times daily. 1.5 tablets (15 MG) in the morning and 1 tablet (10 MG) in the evening    . Hyoscyamine Sulfate (LEVSIN PO) Take 0.125 mg by mouth 4 (four) times daily.    Marland Kitchen levothyroxine (SYNTHROID, LEVOTHROID) 112 MCG tablet Take 112 mcg by mouth daily before breakfast.     . Metoclopramide HCl (REGLAN PO) Take by mouth.    . metoprolol tartrate (LOPRESSOR) 25 MG tablet Take 12.5 mg by mouth 2 (two) times daily.     . Multiple Vitamin (MULTIVITAMIN WITH MINERALS) TABS tablet Take 1 tablet by mouth daily.    . pantoprazole (PROTONIX) 40 MG tablet Take 40 mg by mouth 2 (two) times daily.     . predniSONE (DELTASONE) 10 MG tablet Take 1 tablet (10 mg total) by mouth daily with breakfast. 30 tablet 0  . venlafaxine XR (EFFEXOR-XR) 150 MG 24 hr capsule 150 mg every morning.     . venlafaxine XR (EFFEXOR-XR) 75 MG 24 hr capsule Take 75 mg by mouth every evening.    . lipase/protease/amylase (CREON) 12000 units CPEP capsule Take 12,000 Units by mouth 3 (three) times daily with meals.    . ondansetron (ZOFRAN) 4 MG tablet Take 1 tablet (4 mg total) by mouth every 8 (eight) hours as needed for nausea or vomiting. (Patient not taking: Reported on 10/10/2016) 30 tablet 2  . trametinib dimethyl sulfoxide (MEKINIST) 2 MG tablet Take 1 tablet (2 mg total) by mouth daily. Take 1 hour before or 2 hours after a  meal. Store refrigerated in original container. (Patient not taking: Reported on 10/10/2016) 30 tablet 5   No current facility-administered medications for this visit.    Facility-Administered Medications Ordered in Other Visits  Medication Dose Route Frequency Provider Last Rate Last Dose  . 0.9 %  sodium chloride infusion   Intravenous Once Lloyd Huger, MD      . heparin lock flush 100 unit/mL  500 Units Intracatheter Once PRN Lloyd Huger, MD        OBJECTIVE: Vitals:   10/10/16 1121  BP: 119/76  Pulse: 64  Resp: 18  Temp: (!) 97.1 F (36.2 C)     Body mass index is 24.57 kg/m.    ECOG FS:0 - Asymptomatic  General: Well-developed, well-nourished, no acute distress. Eyes: Pink conjunctiva, anicteric sclera. Lungs: Clear to auscultation bilaterally. Heart: Regular rate and rhythm. No  rubs, murmurs, or gallops. Abdomen: Soft, nontender, nondistended. No organomegaly noted, normoactive bowel sounds. Musculoskeletal: No edema, cyanosis, or clubbing. Mild swelling of joints, no decrease in strength. Mild tenderness to touch. Neuro: Alert, answering all questions appropriately. Cranial nerves grossly intact. Skin: No rash or petechiae noted. Psych: Normal affect.   LAB RESULTS:  Lab Results  Component Value Date   NA 134 (L) 10/10/2016   K 4.4 10/10/2016   CL 102 10/10/2016   CO2 25 10/10/2016   GLUCOSE 101 (H) 10/10/2016   BUN 18 10/10/2016   CREATININE 1.84 (H) 10/10/2016   CALCIUM 8.4 (L) 10/10/2016   PROT 6.0 (L) 10/10/2016   ALBUMIN 3.5 10/10/2016   AST 30 10/10/2016   ALT 15 10/10/2016   ALKPHOS 75 10/10/2016   BILITOT 0.6 10/10/2016   GFRNONAA 30 (L) 10/10/2016   GFRAA 35 (L) 10/10/2016    Lab Results  Component Value Date   WBC 6.5 10/10/2016   NEUTROABS 5.0 10/10/2016   HGB 11.9 (L) 10/10/2016   HCT 35.1 10/10/2016   MCV 91.9 10/10/2016   PLT 225 10/10/2016    STUDIES: Nm Pet Image Restage (ps) Whole Body  Result Date:  10/06/2016 CLINICAL DATA:  Subsequent treatment strategy for malignant melanoma. Restaging. EXAM: NUCLEAR MEDICINE PET WHOLE BODY TECHNIQUE: 12.9 MCi F-18 FDG was injected intravenously. Full-ring PET imaging was performed from the vertex to the feet after the radiotracer. CT data was obtained and used for attenuation correction and anatomic localization. FASTING BLOOD GLUCOSE:  Value: 80 mg/dl COMPARISON:  07/04/2016 PET.  Abdominopelvic CT of 09/03/2016 FINDINGS: HEAD/NECK No abnormal wall hypermetabolism within the head or neck. No cervical adenopathy. CHEST Left paratracheal/AP window nodes measure maximally 9 mm on image 118/ series 3 today versus 11 mm on the prior exam (when remeasured). Not hypermetabolic to the surrounding mediastinal pool. A right paratracheal node measures 8 mm and not hypermetabolic to the mediastinal pool today. Compare 10 mm on the prior. Left Port-A-Cath which terminates at the high right atrium. No new thoracic adenopathy. Lad coronary artery atherosclerosis. ABDOMEN/PELVIS Multifocal hypermetabolism within the pancreatic head and body. The most focal area corresponds to residual soft tissue fullness within the head and uncinate process. This measures a S.U.V. max of 5.9 on image 192/ series 3. This hypermetabolism is new since the prior exam, and likely related to interval pancreatitis. The previously described portal caval node measures 8 mm on image 187/series 3 and is similar (when remeasured). Hypermetabolism in the adjacent pancreas makes metabolic evaluation of this node impossible. Right external iliac node measures 1.7 cm and a S.U.V. max of 1.6 on image 266/series 3. Compare similar in size and a S.U.V. max of 2.8 on the prior. No new hypermetabolic nodes within the abdomen or pelvis. Normal adrenal glands. Mild renal cortical thinning bilaterally. Abdominal aortic atherosclerosis. Colonic stool burden suggests constipation. SKELETON Left sacral hypermetabolism corresponds  to vague but increased sclerosis. This measures a S.U.V. max of 2.9 on image 242/series 3. Compare a S.U.V. max of 2.8 on the prior exam. Right acromioclavicular joint hypermetabolism is likely degenerative. Healing of eleventh posterior left rib. EXTREMITIES No abnormal hypermetabolic activity in the lower extremities.Remote intramedullary rod within the left femur. IMPRESSION: 1. Response to therapy. 2. Decrease size of thoracic nodes without residual hypermetabolism. 3. Similar size of a right external iliac node with decreased hypermetabolism. 4. Similar hypermetabolism within the left sacral lesion which has undergone interval sclerosis, likely due to partial healing. 5. Age advanced coronary artery atherosclerosis.  Recommend assessment of coronary risk factors and consideration of medical therapy. 6. Hypermetabolism within the pancreas with concurrent soft tissue fullness primarily in the pancreatic head. Given findings on recent CT and MRI, absence of hypermetabolism on prior MRI, this is favored to represent resolving pancreatitis. Electronically Signed   By: Abigail Miyamoto M.D.   On: 10/06/2016 16:13    ONCOLOGIC HISTORY: Patient initially diagnosed with metastatic melanoma to right thigh with unknown primary in 2009. She received 4 weeks of high-dose interferon therapy in February 2009 and proceed with low-dose interferon therapy in March 2009. Patient remained in remission until August 2012 when she was noted to have recurrent melanoma and a pelvic lymph node. October 2012 she had 2 cycles of Ipilumimab which was discontinued secondary to poor tolerance. In April 2017 PET scan showed recurrent disease in the right inguinal region which was confirmed by biopsy. Zeboraf discontinued in November 2017 secondary to intolerable rash. Patient initiated combination immunotherapy with nivolumab and ipilumimab on January 14, 2016. Patient initiated maintenance nivolumab on April 14, 2016.  ASSESSMENT:  Recurrent metastatic melanoma, BRAF+, of right lower extremity, metastatic.  PLAN:    1. Recurrent metastatic melanoma, BRAF+, of right lower extremity, metastatic:  PET scan results from October 16, 2016 reviewed independently and reported as above with continued improvement of disease burden. She completed palliative XRT to her iliac crest lesion on August 11, 2016. Although unusual, it is a possibility her persistent pancreatitis is related to nivolumab. After lengthy discussion with the patient and given her continued response to treatment, she wishes to continue with her current treatment options. Previously we discussed using 2 mg trametinib (Mekinist) daily. If patient tolerated this well, can attempt combination therapy using dabrafenib/trametinib in the future. Single agent Cobimetinib is also an option. Proceed with cycle 10 of nivoluab today. Return to clinic in 2 weeks for further evaluation and consideration of cycle 11. Will reimage in November 2018.   2. Pain: Patient states she has discontinued Plaquenil.   3. Nausea: Continue Compazine as needed. 4. Diarrhea: Continue Imodium OTC as needed. 5. Pancreatitis: Unclear etiology. Continue follow-up with GI as indicated.  Approximately 30 minutes was spent in discussion of which greater than 50% was consultation.  Patient expressed understanding and was in agreement with this plan. She also understands that She can call clinic at any time with any questions, concerns, or complaints.    Lloyd Huger, MD 10/10/16 2:09 PM

## 2016-10-10 ENCOUNTER — Inpatient Hospital Stay (HOSPITAL_BASED_OUTPATIENT_CLINIC_OR_DEPARTMENT_OTHER): Payer: Medicare Other | Admitting: Oncology

## 2016-10-10 ENCOUNTER — Inpatient Hospital Stay: Payer: Medicare Other | Attending: Oncology

## 2016-10-10 ENCOUNTER — Inpatient Hospital Stay: Payer: Medicare Other

## 2016-10-10 VITALS — BP 119/76 | HR 64 | Temp 97.1°F | Resp 18 | Wt 156.9 lb

## 2016-10-10 DIAGNOSIS — C792 Secondary malignant neoplasm of skin: Secondary | ICD-10-CM

## 2016-10-10 DIAGNOSIS — F17210 Nicotine dependence, cigarettes, uncomplicated: Secondary | ICD-10-CM | POA: Diagnosis not present

## 2016-10-10 DIAGNOSIS — I509 Heart failure, unspecified: Secondary | ICD-10-CM | POA: Insufficient documentation

## 2016-10-10 DIAGNOSIS — I2510 Atherosclerotic heart disease of native coronary artery without angina pectoris: Secondary | ICD-10-CM | POA: Insufficient documentation

## 2016-10-10 DIAGNOSIS — R197 Diarrhea, unspecified: Secondary | ICD-10-CM | POA: Diagnosis not present

## 2016-10-10 DIAGNOSIS — C439 Malignant melanoma of skin, unspecified: Secondary | ICD-10-CM

## 2016-10-10 DIAGNOSIS — Z800 Family history of malignant neoplasm of digestive organs: Secondary | ICD-10-CM

## 2016-10-10 DIAGNOSIS — C801 Malignant (primary) neoplasm, unspecified: Secondary | ICD-10-CM

## 2016-10-10 DIAGNOSIS — I700 Atherosclerosis of aorta: Secondary | ICD-10-CM

## 2016-10-10 DIAGNOSIS — Z923 Personal history of irradiation: Secondary | ICD-10-CM | POA: Insufficient documentation

## 2016-10-10 DIAGNOSIS — R110 Nausea: Secondary | ICD-10-CM

## 2016-10-10 DIAGNOSIS — C799 Secondary malignant neoplasm of unspecified site: Secondary | ICD-10-CM

## 2016-10-10 DIAGNOSIS — E0590 Thyrotoxicosis, unspecified without thyrotoxic crisis or storm: Secondary | ICD-10-CM | POA: Diagnosis not present

## 2016-10-10 DIAGNOSIS — Z79899 Other long term (current) drug therapy: Secondary | ICD-10-CM

## 2016-10-10 DIAGNOSIS — K859 Acute pancreatitis without necrosis or infection, unspecified: Secondary | ICD-10-CM

## 2016-10-10 DIAGNOSIS — K219 Gastro-esophageal reflux disease without esophagitis: Secondary | ICD-10-CM

## 2016-10-10 DIAGNOSIS — Z5112 Encounter for antineoplastic immunotherapy: Secondary | ICD-10-CM | POA: Insufficient documentation

## 2016-10-10 DIAGNOSIS — R609 Edema, unspecified: Secondary | ICD-10-CM

## 2016-10-10 DIAGNOSIS — E039 Hypothyroidism, unspecified: Secondary | ICD-10-CM

## 2016-10-10 DIAGNOSIS — C4370 Malignant melanoma of unspecified lower limb, including hip: Secondary | ICD-10-CM

## 2016-10-10 LAB — COMPREHENSIVE METABOLIC PANEL
ALK PHOS: 75 U/L (ref 38–126)
ALT: 15 U/L (ref 14–54)
AST: 30 U/L (ref 15–41)
Albumin: 3.5 g/dL (ref 3.5–5.0)
Anion gap: 7 (ref 5–15)
BUN: 18 mg/dL (ref 6–20)
CALCIUM: 8.4 mg/dL — AB (ref 8.9–10.3)
CHLORIDE: 102 mmol/L (ref 101–111)
CO2: 25 mmol/L (ref 22–32)
Creatinine, Ser: 1.84 mg/dL — ABNORMAL HIGH (ref 0.44–1.00)
GFR, EST AFRICAN AMERICAN: 35 mL/min — AB (ref >60)
GFR, EST NON AFRICAN AMERICAN: 30 mL/min — AB (ref >60)
Glucose, Bld: 101 mg/dL — ABNORMAL HIGH (ref 65–99)
Potassium: 4.4 mmol/L (ref 3.5–5.1)
SODIUM: 134 mmol/L — AB (ref 135–145)
Total Bilirubin: 0.6 mg/dL (ref 0.3–1.2)
Total Protein: 6 g/dL — ABNORMAL LOW (ref 6.5–8.1)

## 2016-10-10 LAB — CBC WITH DIFFERENTIAL/PLATELET
BASOS ABS: 0.1 10*3/uL (ref 0–0.1)
Basophils Relative: 1 %
EOS PCT: 5 %
Eosinophils Absolute: 0.3 10*3/uL (ref 0–0.7)
HCT: 35.1 % (ref 35.0–47.0)
HEMOGLOBIN: 11.9 g/dL — AB (ref 12.0–16.0)
LYMPHS ABS: 0.7 10*3/uL — AB (ref 1.0–3.6)
LYMPHS PCT: 11 %
MCH: 31.3 pg (ref 26.0–34.0)
MCHC: 34 g/dL (ref 32.0–36.0)
MCV: 91.9 fL (ref 80.0–100.0)
Monocytes Absolute: 0.4 10*3/uL (ref 0.2–0.9)
Monocytes Relative: 6 %
NEUTROS ABS: 5 10*3/uL (ref 1.4–6.5)
NEUTROS PCT: 77 %
PLATELETS: 225 10*3/uL (ref 150–440)
RBC: 3.82 MIL/uL (ref 3.80–5.20)
RDW: 14.9 % — ABNORMAL HIGH (ref 11.5–14.5)
WBC: 6.5 10*3/uL (ref 3.6–11.0)

## 2016-10-10 MED ORDER — HEPARIN SOD (PORK) LOCK FLUSH 100 UNIT/ML IV SOLN
500.0000 [IU] | Freq: Once | INTRAVENOUS | Status: AC
  Administered 2016-10-10: 500 [IU] via INTRAVENOUS
  Filled 2016-10-10: qty 5

## 2016-10-10 MED ORDER — SODIUM CHLORIDE 0.9% FLUSH
10.0000 mL | Freq: Once | INTRAVENOUS | Status: AC
  Administered 2016-10-10: 10 mL via INTRAVENOUS
  Filled 2016-10-10: qty 10

## 2016-10-10 MED ORDER — SODIUM CHLORIDE 0.9 % IV SOLN
Freq: Once | INTRAVENOUS | Status: DC
  Filled 2016-10-10: qty 1000

## 2016-10-10 MED ORDER — HEPARIN SOD (PORK) LOCK FLUSH 100 UNIT/ML IV SOLN: 500.0000 [IU] | Freq: Once | INTRAVENOUS | Status: DC | PRN

## 2016-10-10 MED ORDER — SODIUM CHLORIDE 0.9 % IV SOLN
240.0000 mg | Freq: Once | INTRAVENOUS | Status: AC
  Administered 2016-10-10: 240 mg via INTRAVENOUS
  Filled 2016-10-10: qty 24

## 2016-10-11 ENCOUNTER — Other Ambulatory Visit: Payer: Medicare Other

## 2016-10-11 ENCOUNTER — Encounter: Payer: Self-pay | Admitting: Oncology

## 2016-10-11 ENCOUNTER — Ambulatory Visit: Payer: Medicare Other | Admitting: Oncology

## 2016-10-11 LAB — THYROID PANEL WITH TSH
FREE THYROXINE INDEX: 0.8 — AB (ref 1.2–4.9)
T3 UPTAKE RATIO: 21 % — AB (ref 24–39)
T4 TOTAL: 3.8 ug/dL — AB (ref 4.5–12.0)
TSH: 0.306 u[IU]/mL — ABNORMAL LOW (ref 0.450–4.500)

## 2016-10-23 NOTE — Progress Notes (Signed)
[place]  [phone] Fax:(336) [phone]  ID: Patricia Li OB: 1960/05/14  MR#: [id number]  FUX#:[id number]  Patient Care Team: Jene Every, MD as PCP - General (Family Medicine) Bary Castilla, Forest Gleason, MD as Consulting Physician (General Surgery) Forest Gleason, MD (Oncology) Lloyd Huger, MD as Consulting Physician (Oncology)  CHIEF COMPLAINT: Recurrent metastatic melanoma, BRAF+, of right lower extremity, metastatic.  INTERVAL HISTORY: Patient returns to clinic today for further evaluation and consideration of cycle 11 of maintenance nivolumab. She continues to have problems with diarrhea and reports bowel movements 20 times a day. She has seen GI and is scheduled to return to see them September 9th. She has had a history of pancreatitis and reports being asymptomatic at this time and generally feeling well. We discussed her thyroid results from 10/10/16 which showed an over corrected tsh level. Dr. Mickie Kay with endocrinology manages her thyroid medication and she has a follow up appointment with her scheduled. She has no neurological complaints. She denies any recent fevers or illnesses. She has a good appetite. She denies any chest pain or shortness of breath. She has no nausea, vomiting, or constipation. She has no urinary complaints. Patient offers no further specific complaints today.   REVIEW OF SYSTEMS:   Review of Systems  Constitutional: Negative.  Negative for fever, malaise/fatigue and weight loss.  HENT: Negative.  Negative for ear pain and tinnitus.   Eyes: Negative.   Respiratory: Negative for cough and shortness of breath.   Cardiovascular: Negative.  Negative for chest pain, palpitations and leg swelling.  Gastrointestinal: Positive for diarrhea. Negative for abdominal pain, blood in stool, heartburn and nausea.  Genitourinary: Negative.   Musculoskeletal: Negative for joint pain.  Skin: Negative for itching and rash.  Neurological:  Negative.  Negative for sensory change and weakness.  Endo/Heme/Allergies: Does not bruise/bleed easily.  Psychiatric/Behavioral: The patient is nervous/anxious. The patient does not have insomnia.     As per HPI. Otherwise, a complete review of systems is negative.  PAST MEDICAL HISTORY: Past Medical History:  Diagnosis Date  . CHF (congestive heart failure) ([place]) 2016   Dr. Nehemiah Massed  . GERD (gastroesophageal reflux disease) 2012  . Hypothyroidism   . Lumbar herniated disc 2010  . Malignant melanoma of skin of lower limb, including hip ([place]) 2008, 2017   right thigh  . Personal history of malignant melanoma of skin 2012  . Tendonitis   . Tick bite of head 07/20/2015    PAST SURGICAL HISTORY: Past Surgical History:  Procedure Laterality Date  . BACK SURGERY  2010  . BREAST CYST EXCISION  1985  . COLONOSCOPY  07/2012   [facility]  . FRACTURE SURGERY     rod removed from left femur 07/21/2014  . INGUINAL LYMPH NODE BIOPSY Right 07/17/2015   Procedure: INGUINAL LYMPH NODE BIOPSY/EXCISION;  Surgeon: Robert Bellow, MD;  Location: [facility] ORS;  Service: General;  Laterality: Right;  . MELANOMA EXCISION  2008   right thigh  . PORTACATH PLACEMENT Left 03/14/2016   Procedure: INSERTION PORT-A-CATH;  Surgeon: Robert Bellow, MD;  Location: [facility] ORS;  Service: General;  Laterality: Left;  . SKIN LESION EXCISION Right 12-19-10   posterior    FAMILY HISTORY: Family History  Problem Relation Age of Onset  . COPD Father   . Cancer Sister        Colon, sister       ADVANCED DIRECTIVES:    HEALTH MAINTENANCE: Social History  Substance Use Topics  .  Smoking status: Current Every Day Smoker    Packs/day: 0.50    Years: 20.00    Types: Cigarettes  . Smokeless tobacco: Never Used  . Alcohol use 0.0 oz/week     Comment: ocassionally     Colonoscopy:  PAP:  Bone density:  Lipid panel:  Allergies  Allergen Reactions  . Clindamycin Shortness Of Breath  . Levaquin  [Levofloxacin] Other (See Comments)    Joint inflammation  . Aripiprazole Other (See Comments)    Facial and head ticks  . Valproic Acid     Other reaction(s): Other (See Comments) Facial twitching    Current Outpatient Prescriptions  Medication Sig Dispense Refill  . amitriptyline (ELAVIL) 25 MG tablet TAKE 1 TABLET BY MOUTH EVERY DAY AT NIGHT  0  . aspirin-acetaminophen-caffeine (EXCEDRIN MIGRAINE) 250-250-65 MG tablet Take 2 tablets by mouth daily as needed for headache.    . calcium carbonate (OS-CAL) 600 MG TABS tablet Take 600 mg by mouth 2 (two) times daily with a meal.    . clonazePAM (KLONOPIN) 0.5 MG tablet Take 0.5 mg by mouth at bedtime.    . cyanocobalamin (,VITAMIN B-12,) 1000 MCG/ML injection INJECT 1ML INTO THE MUSCLE MONTHLY  6  . furosemide (LASIX) 20 MG tablet Take 20 mg by mouth every other day.     . gabapentin (NEURONTIN) 300 MG capsule Take 300 mg by mouth 3 (three) times daily.    Marland Kitchen HYDROcodone-acetaminophen (NORCO/VICODIN) 5-325 MG tablet Take 1 tablet by mouth every 4 (four) hours as needed for moderate pain. 60 tablet 0  . hydrocortisone (CORTEF) 10 MG tablet Take 10-15 mg by mouth 2 (two) times daily. 1.5 tablets (15 MG) in the morning and 1 tablet (10 MG) in the evening    . Hyoscyamine Sulfate (LEVSIN PO) Take 0.125 mg by mouth 4 (four) times daily.    Marland Kitchen levothyroxine (SYNTHROID, LEVOTHROID) 112 MCG tablet Take 112 mcg by mouth daily before breakfast.     . lipase/protease/amylase (CREON) 12000 units CPEP capsule Take 12,000 Units by mouth 3 (three) times daily with meals.    . Metoclopramide HCl (REGLAN PO) Take by mouth.    . metoprolol tartrate (LOPRESSOR) 25 MG tablet Take 12.5 mg by mouth 2 (two) times daily.     . Multiple Vitamin (MULTIVITAMIN WITH MINERALS) TABS tablet Take 1 tablet by mouth daily.    . ondansetron (ZOFRAN) 4 MG tablet Take 1 tablet (4 mg total) by mouth every 8 (eight) hours as needed for nausea or vomiting. 30 tablet 2  .  pantoprazole (PROTONIX) 40 MG tablet Take 40 mg by mouth 2 (two) times daily.     . predniSONE (DELTASONE) 10 MG tablet Take 1 tablet (10 mg total) by mouth daily with breakfast. 30 tablet 0  . trametinib dimethyl sulfoxide (MEKINIST) 2 MG tablet Take 1 tablet (2 mg total) by mouth daily. Take 1 hour before or 2 hours after a meal. Store refrigerated in original container. 30 tablet 5  . venlafaxine XR (EFFEXOR-XR) 150 MG 24 hr capsule 150 mg every morning.     . venlafaxine XR (EFFEXOR-XR) 75 MG 24 hr capsule Take 75 mg by mouth every evening.     No current facility-administered medications for this visit.     OBJECTIVE: Vitals:   10/24/16 1002  BP: 122/86  Pulse: 79  Temp: 97.7 F (36.5 C)     Body mass index is 24.21 kg/m.    ECOG FS:0 - Asymptomatic  General: Well-developed, well-nourished, no  acute distress. Eyes: Pink conjunctiva, anicteric sclera. Lungs: Clear to auscultation bilaterally. Heart: Regular rate and rhythm. No rubs, murmurs, or gallops. Abdomen: Soft, nontender, nondistended. No organomegaly noted, normoactive bowel sounds. Musculoskeletal: No edema, cyanosis, or clubbing.  Neuro: Alert, answering all questions appropriately. Cranial nerves grossly intact. Skin: No rash or petechiae noted.  Psych: Normal affect.   LAB RESULTS:  Lab Results  Component Value Date   NA 134 (L) 10/24/2016   K 3.6 10/24/2016   CL 103 10/24/2016   CO2 26 10/24/2016   GLUCOSE 125 (H) 10/24/2016   BUN 12 10/24/2016   CREATININE 1.82 (H) 10/24/2016   CALCIUM 8.4 (L) 10/24/2016   PROT 6.0 (L) 10/24/2016   ALBUMIN 3.5 10/24/2016   AST 31 10/24/2016   ALT 12 (L) 10/24/2016   ALKPHOS 84 10/24/2016   BILITOT 0.4 10/24/2016   GFRNONAA 30 (L) 10/24/2016   GFRAA 35 (L) 10/24/2016    Lab Results  Component Value Date   WBC 5.2 10/24/2016   NEUTROABS 3.7 10/24/2016   HGB 11.5 (L) 10/24/2016   HCT 33.9 (L) 10/24/2016   MCV 92.2 10/24/2016   PLT 313 10/24/2016     STUDIES: Nm Pet Image Restage (ps) Whole Body  Result Date: 10/06/2016 CLINICAL DATA:  Subsequent treatment strategy for malignant melanoma. Restaging. EXAM: NUCLEAR MEDICINE PET WHOLE BODY TECHNIQUE: 12.9 MCi F-18 FDG was injected intravenously. Full-ring PET imaging was performed from the vertex to the feet after the radiotracer. CT data was obtained and used for attenuation correction and anatomic localization. FASTING BLOOD GLUCOSE:  Value: 80 mg/dl COMPARISON:  07/04/2016 PET.  Abdominopelvic CT of 09/03/2016 FINDINGS: HEAD/NECK No abnormal wall hypermetabolism within the head or neck. No cervical adenopathy. CHEST Left paratracheal/AP window nodes measure maximally 9 mm on image 118/ series 3 today versus 11 mm on the prior exam (when remeasured). Not hypermetabolic to the surrounding mediastinal pool. A right paratracheal node measures 8 mm and not hypermetabolic to the mediastinal pool today. Compare 10 mm on the prior. Left Port-A-Cath which terminates at the high right atrium. No new thoracic adenopathy. Lad coronary artery atherosclerosis. ABDOMEN/PELVIS Multifocal hypermetabolism within the pancreatic head and body. The most focal area corresponds to residual soft tissue fullness within the head and uncinate process. This measures a S.U.V. max of 5.9 on image 192/ series 3. This hypermetabolism is new since the prior exam, and likely related to interval pancreatitis. The previously described portal caval node measures 8 mm on image 187/series 3 and is similar (when remeasured). Hypermetabolism in the adjacent pancreas makes metabolic evaluation of this node impossible. Right external iliac node measures 1.7 cm and a S.U.V. max of 1.6 on image 266/series 3. Compare similar in size and a S.U.V. max of 2.8 on the prior. No new hypermetabolic nodes within the abdomen or pelvis. Normal adrenal glands. Mild renal cortical thinning bilaterally. Abdominal aortic atherosclerosis. Colonic stool burden  suggests constipation. SKELETON Left sacral hypermetabolism corresponds to vague but increased sclerosis. This measures a S.U.V. max of 2.9 on image 242/series 3. Compare a S.U.V. max of 2.8 on the prior exam. Right acromioclavicular joint hypermetabolism is likely degenerative. Healing of eleventh posterior left rib. EXTREMITIES No abnormal hypermetabolic activity in the lower extremities.Remote intramedullary rod within the left femur. IMPRESSION: 1. Response to therapy. 2. Decrease size of thoracic nodes without residual hypermetabolism. 3. Similar size of a right external iliac node with decreased hypermetabolism. 4. Similar hypermetabolism within the left sacral lesion which has undergone interval sclerosis, likely  due to partial healing. 5. Age advanced coronary artery atherosclerosis. Recommend assessment of coronary risk factors and consideration of medical therapy. 6. Hypermetabolism within the pancreas with concurrent soft tissue fullness primarily in the pancreatic head. Given findings on recent CT and MRI, absence of hypermetabolism on prior MRI, this is favored to represent resolving pancreatitis. Electronically Signed   By: Abigail Miyamoto M.D.   On: 10/06/2016 16:13    ONCOLOGIC HISTORY: Patient initially diagnosed with metastatic melanoma to right thigh with unknown primary in 2009. She received 4 weeks of high-dose interferon therapy in February 2009 and proceed with low-dose interferon therapy in March 2009. Patient remained in remission until August 2012 when she was noted to have recurrent melanoma and a pelvic lymph node. October 2012 she had 2 cycles of Ipilumimab which was discontinued secondary to poor tolerance. In April 2017 PET scan showed recurrent disease in the right inguinal region which was confirmed by biopsy. Zeboraf discontinued in November 2017 secondary to intolerable rash. Patient initiated combination immunotherapy with nivolumab and ipilumimab on January 14, 2016. Patient  initiated maintenance nivolumab on April 14, 2016.  ASSESSMENT: Recurrent metastatic melanoma, BRAF+, of right lower extremity, metastatic.  PLAN:    1. Recurrent metastatic melanoma, BRAF+, of right lower extremity, metastatic:  PET scan results from October 16, 2016 reviewed independently and reported as above with continued improvement of disease burden. She completed palliative XRT to her iliac crest lesion on August 11, 2016. Although unusual, it is a possibility her persistent pancreatitis is related to nivolumab. After lengthy discussion with the patient and given her continued response to treatment, she wishes to continue with nivolumab. Previously we discussed using 2 mg trametinib (Mekinist) daily. If patient tolerated this well, can attempt combination therapy using    dabrafenib/trametinib in the future. Single agent Cobimetinib is also an option. Proceed with cycle 11 of nivoluab today. Return to clinic in 2 weeks for further evaluation and consideration of cycle 12. Will reimage in November 2018.   2. Diarrhea: is followed by GI. Continues to take immodium otc prn. Potassium levels have dropped compared to previous visits but remain normal. Encouraged potassium rich foods and will do labs at next visit to monitor.  3. Nausea: Continue Compazine as needed. 4. Pancreatitis: Unclear etiology. Continue follow-up with GI as indicated.  5. Hyperthyroidism r/t chemotherapy: lab work from 10/10/16 showed an over corrected TSH. Patient takes levothyroxine 112 mcg qd. She is followed by Dr. Mickie Kay with endocrine. Will refer patient back to endocrine for consideration of adjustment of levothyroxine.   Approximately 30 minutes was spent in discussion of which greater than 50% was consultation.  Patient expressed understanding and was in agreement with this plan. She also understands that She can call clinic at any time with any questions, concerns, or complaints.   Geniyah Eischeid G. Zenia Resides, NP 10/24/16  10:37Am  Patient was seen and evaluated independently and I agree with the assessment and plan as dictated above. It is possible the patient's persistent diarrhea is also related to her immunotherapy. Continue workup by GI. Given her continued response to treatment would be hesitant to discontinue.  Lloyd Huger, MD 10/24/16 12:00 PM

## 2016-10-24 ENCOUNTER — Encounter: Payer: Self-pay | Admitting: Oncology

## 2016-10-24 ENCOUNTER — Inpatient Hospital Stay: Payer: Medicare Other

## 2016-10-24 ENCOUNTER — Inpatient Hospital Stay (HOSPITAL_BASED_OUTPATIENT_CLINIC_OR_DEPARTMENT_OTHER): Payer: Medicare Other | Admitting: Oncology

## 2016-10-24 VITALS — BP 122/86 | HR 79 | Temp 97.7°F | Wt 154.6 lb

## 2016-10-24 DIAGNOSIS — K219 Gastro-esophageal reflux disease without esophagitis: Secondary | ICD-10-CM | POA: Diagnosis not present

## 2016-10-24 DIAGNOSIS — I2510 Atherosclerotic heart disease of native coronary artery without angina pectoris: Secondary | ICD-10-CM

## 2016-10-24 DIAGNOSIS — F17210 Nicotine dependence, cigarettes, uncomplicated: Secondary | ICD-10-CM

## 2016-10-24 DIAGNOSIS — C792 Secondary malignant neoplasm of skin: Secondary | ICD-10-CM

## 2016-10-24 DIAGNOSIS — E785 Hyperlipidemia, unspecified: Secondary | ICD-10-CM | POA: Insufficient documentation

## 2016-10-24 DIAGNOSIS — R609 Edema, unspecified: Secondary | ICD-10-CM

## 2016-10-24 DIAGNOSIS — R197 Diarrhea, unspecified: Secondary | ICD-10-CM

## 2016-10-24 DIAGNOSIS — C801 Malignant (primary) neoplasm, unspecified: Secondary | ICD-10-CM

## 2016-10-24 DIAGNOSIS — C799 Secondary malignant neoplasm of unspecified site: Secondary | ICD-10-CM

## 2016-10-24 DIAGNOSIS — Z5112 Encounter for antineoplastic immunotherapy: Secondary | ICD-10-CM | POA: Diagnosis not present

## 2016-10-24 DIAGNOSIS — R110 Nausea: Secondary | ICD-10-CM

## 2016-10-24 DIAGNOSIS — E2740 Unspecified adrenocortical insufficiency: Secondary | ICD-10-CM | POA: Insufficient documentation

## 2016-10-24 DIAGNOSIS — E039 Hypothyroidism, unspecified: Secondary | ICD-10-CM | POA: Diagnosis not present

## 2016-10-24 DIAGNOSIS — I700 Atherosclerosis of aorta: Secondary | ICD-10-CM

## 2016-10-24 DIAGNOSIS — C4370 Malignant melanoma of unspecified lower limb, including hip: Secondary | ICD-10-CM

## 2016-10-24 DIAGNOSIS — Z79899 Other long term (current) drug therapy: Secondary | ICD-10-CM

## 2016-10-24 DIAGNOSIS — C439 Malignant melanoma of skin, unspecified: Secondary | ICD-10-CM

## 2016-10-24 DIAGNOSIS — E0590 Thyrotoxicosis, unspecified without thyrotoxic crisis or storm: Secondary | ICD-10-CM

## 2016-10-24 DIAGNOSIS — K859 Acute pancreatitis without necrosis or infection, unspecified: Secondary | ICD-10-CM | POA: Diagnosis not present

## 2016-10-24 DIAGNOSIS — F418 Other specified anxiety disorders: Secondary | ICD-10-CM | POA: Insufficient documentation

## 2016-10-24 DIAGNOSIS — I509 Heart failure, unspecified: Secondary | ICD-10-CM | POA: Diagnosis not present

## 2016-10-24 DIAGNOSIS — C779 Secondary and unspecified malignant neoplasm of lymph node, unspecified: Principal | ICD-10-CM

## 2016-10-24 DIAGNOSIS — Z923 Personal history of irradiation: Secondary | ICD-10-CM

## 2016-10-24 DIAGNOSIS — Z800 Family history of malignant neoplasm of digestive organs: Secondary | ICD-10-CM

## 2016-10-24 DIAGNOSIS — C4371 Malignant melanoma of right lower limb, including hip: Secondary | ICD-10-CM

## 2016-10-24 DIAGNOSIS — R413 Other amnesia: Secondary | ICD-10-CM | POA: Insufficient documentation

## 2016-10-24 LAB — CBC WITH DIFFERENTIAL/PLATELET
Basophils Absolute: 0.1 10*3/uL (ref 0–0.1)
Basophils Relative: 1 %
EOS ABS: 0.2 10*3/uL (ref 0–0.7)
Eosinophils Relative: 4 %
HEMATOCRIT: 33.9 % — AB (ref 35.0–47.0)
HEMOGLOBIN: 11.5 g/dL — AB (ref 12.0–16.0)
LYMPHS ABS: 0.9 10*3/uL — AB (ref 1.0–3.6)
LYMPHS PCT: 18 %
MCH: 31.4 pg (ref 26.0–34.0)
MCHC: 34 g/dL (ref 32.0–36.0)
MCV: 92.2 fL (ref 80.0–100.0)
Monocytes Absolute: 0.3 10*3/uL (ref 0.2–0.9)
Monocytes Relative: 6 %
NEUTROS ABS: 3.7 10*3/uL (ref 1.4–6.5)
NEUTROS PCT: 71 %
Platelets: 313 10*3/uL (ref 150–440)
RBC: 3.67 MIL/uL — AB (ref 3.80–5.20)
RDW: 15.5 % — ABNORMAL HIGH (ref 11.5–14.5)
WBC: 5.2 10*3/uL (ref 3.6–11.0)

## 2016-10-24 LAB — COMPREHENSIVE METABOLIC PANEL
ALBUMIN: 3.5 g/dL (ref 3.5–5.0)
ALK PHOS: 84 U/L (ref 38–126)
ALT: 12 U/L — AB (ref 14–54)
AST: 31 U/L (ref 15–41)
Anion gap: 5 (ref 5–15)
BILIRUBIN TOTAL: 0.4 mg/dL (ref 0.3–1.2)
BUN: 12 mg/dL (ref 6–20)
CO2: 26 mmol/L (ref 22–32)
Calcium: 8.4 mg/dL — ABNORMAL LOW (ref 8.9–10.3)
Chloride: 103 mmol/L (ref 101–111)
Creatinine, Ser: 1.82 mg/dL — ABNORMAL HIGH (ref 0.44–1.00)
GFR calc Af Amer: 35 mL/min — ABNORMAL LOW (ref >60)
GFR calc non Af Amer: 30 mL/min — ABNORMAL LOW (ref >60)
GLUCOSE: 125 mg/dL — AB (ref 65–99)
Potassium: 3.6 mmol/L (ref 3.5–5.1)
SODIUM: 134 mmol/L — AB (ref 135–145)
Total Protein: 6 g/dL — ABNORMAL LOW (ref 6.5–8.1)

## 2016-10-24 MED ORDER — HEPARIN SOD (PORK) LOCK FLUSH 100 UNIT/ML IV SOLN
500.0000 [IU] | Freq: Once | INTRAVENOUS | Status: AC | PRN
  Administered 2016-10-24: 500 [IU]
  Filled 2016-10-24: qty 5

## 2016-10-24 MED ORDER — SODIUM CHLORIDE 0.9 % IV SOLN
Freq: Once | INTRAVENOUS | Status: AC
  Administered 2016-10-24: 11:00:00 via INTRAVENOUS
  Filled 2016-10-24: qty 1000

## 2016-10-24 MED ORDER — NIVOLUMAB CHEMO INJECTION 100 MG/10ML
240.0000 mg | Freq: Once | INTRAVENOUS | Status: AC
  Administered 2016-10-24: 240 mg via INTRAVENOUS
  Filled 2016-10-24: qty 24

## 2016-10-25 LAB — THYROID PANEL WITH TSH
Free Thyroxine Index: 1 — ABNORMAL LOW (ref 1.2–4.9)
T3 Uptake Ratio: 22 % — ABNORMAL LOW (ref 24–39)
T4, Total: 4.5 ug/dL (ref 4.5–12.0)
TSH: 0.249 u[IU]/mL — ABNORMAL LOW (ref 0.450–4.500)

## 2016-10-31 ENCOUNTER — Other Ambulatory Visit: Payer: Self-pay | Admitting: Oncology

## 2016-11-01 ENCOUNTER — Telehealth: Payer: Self-pay | Admitting: *Deleted

## 2016-11-01 NOTE — Telephone Encounter (Signed)
Call placed to pt to confirm current dose of Prednisone, left vm message for pt to return call with current dose.

## 2016-11-02 DIAGNOSIS — K5900 Constipation, unspecified: Secondary | ICD-10-CM | POA: Diagnosis not present

## 2016-11-02 DIAGNOSIS — C439 Malignant melanoma of skin, unspecified: Secondary | ICD-10-CM | POA: Diagnosis not present

## 2016-11-02 DIAGNOSIS — K625 Hemorrhage of anus and rectum: Secondary | ICD-10-CM | POA: Diagnosis not present

## 2016-11-02 DIAGNOSIS — E038 Other specified hypothyroidism: Secondary | ICD-10-CM | POA: Diagnosis not present

## 2016-11-02 DIAGNOSIS — E230 Hypopituitarism: Secondary | ICD-10-CM | POA: Diagnosis not present

## 2016-11-02 DIAGNOSIS — C779 Secondary and unspecified malignant neoplasm of lymph node, unspecified: Secondary | ICD-10-CM | POA: Diagnosis not present

## 2016-11-02 DIAGNOSIS — Z8719 Personal history of other diseases of the digestive system: Secondary | ICD-10-CM | POA: Diagnosis not present

## 2016-11-02 DIAGNOSIS — R197 Diarrhea, unspecified: Secondary | ICD-10-CM | POA: Diagnosis not present

## 2016-11-02 DIAGNOSIS — R1084 Generalized abdominal pain: Secondary | ICD-10-CM | POA: Diagnosis not present

## 2016-11-02 DIAGNOSIS — R634 Abnormal weight loss: Secondary | ICD-10-CM | POA: Diagnosis not present

## 2016-11-02 DIAGNOSIS — N183 Chronic kidney disease, stage 3 (moderate): Secondary | ICD-10-CM | POA: Diagnosis not present

## 2016-11-03 NOTE — Telephone Encounter (Signed)
Patient called and states she is taking 10 mg of prednisone. Not sure if you knew or not.

## 2016-11-03 NOTE — Telephone Encounter (Signed)
Ok, she needs to taper off if she can. Will address at her next visit.

## 2016-11-04 DIAGNOSIS — R634 Abnormal weight loss: Secondary | ICD-10-CM | POA: Insufficient documentation

## 2016-11-04 NOTE — Progress Notes (Signed)
[place]  Telephone:(336) [phone] Fax:(336) [phone]  ID: [name] OB: 15-May-1960  MR#: [id number]  EYC#:[id number]  Patient Care Team: Jene Every, MD as PCP - General (Family Medicine) Bary Castilla, Forest Gleason, MD as Consulting Physician (General Surgery) Forest Gleason, MD (Oncology) Lloyd Huger, MD as Consulting Physician (Oncology)  CHIEF COMPLAINT: Recurrent metastatic melanoma, BRAF+, of right lower extremity, metastatic.  INTERVAL HISTORY: Patient returns to clinic today for further evaluation and consideration of cycle 12 of maintenance nivolumab. She continues to have persistent diarrhea up to 15 times per day. She had consultation with GI recently and has a colonoscopy scheduled for November 21, 2016. She otherwise feels well.She has no neurological complaints. She denies any recent fevers or illnesses. She has a fair appetite. She denies any chest pain or shortness of breath. She has no nausea, vomiting, or constipation. She has no urinary complaints. Patient offers no further specific complaints today.   REVIEW OF SYSTEMS:   Review of Systems  Constitutional: Negative.  Negative for fever, malaise/fatigue and weight loss.  HENT: Negative.  Negative for ear pain and tinnitus.   Eyes: Negative.   Respiratory: Negative for cough and shortness of breath.   Cardiovascular: Negative.  Negative for chest pain and leg swelling.  Gastrointestinal: Positive for diarrhea. Negative for abdominal pain, blood in stool, heartburn and melena.  Genitourinary: Negative.   Musculoskeletal: Positive for joint pain.  Skin: Negative for itching and rash.  Neurological: Negative.  Negative for sensory change and weakness.  Endo/Heme/Allergies: Does not bruise/bleed easily.  Psychiatric/Behavioral: Negative.  The patient is not nervous/anxious and does not have insomnia.     As per HPI. Otherwise, a complete review of systems is negative.  PAST MEDICAL  HISTORY: Past Medical History:  Diagnosis Date  . CHF (congestive heart failure) ([place]) 2016   Dr. Nehemiah Massed  . GERD (gastroesophageal reflux disease) 2012  . Hypothyroidism   . Lumbar herniated disc 2010  . Malignant melanoma of skin of lower limb, including hip ([place]) 2008, 2017   right thigh  . Personal history of malignant melanoma of skin 2012  . Tendonitis   . Tick bite of head 07/20/2015    PAST SURGICAL HISTORY: Past Surgical History:  Procedure Laterality Date  . BACK SURGERY  2010  . BREAST CYST EXCISION  1985  . COLONOSCOPY  07/2012   [facility]  . FRACTURE SURGERY     rod removed from left femur 07/21/2014  . INGUINAL LYMPH NODE BIOPSY Right 07/17/2015   Procedure: INGUINAL LYMPH NODE BIOPSY/EXCISION;  Surgeon: Robert Bellow, MD;  Location: [facility] ORS;  Service: General;  Laterality: Right;  . MELANOMA EXCISION  2008   right thigh  . PORTACATH PLACEMENT Left 03/14/2016   Procedure: INSERTION PORT-A-CATH;  Surgeon: Robert Bellow, MD;  Location: [facility] ORS;  Service: General;  Laterality: Left;  . SKIN LESION EXCISION Right 12-19-10   posterior    FAMILY HISTORY: Family History  Problem Relation Age of Onset  . COPD Father   . Cancer Sister        Colon, sister       ADVANCED DIRECTIVES:    HEALTH MAINTENANCE: Social History  Substance Use Topics  . Smoking status: Current Every Day Smoker    Packs/day: 0.50    Years: 20.00    Types: Cigarettes  . Smokeless tobacco: Never Used  . Alcohol use 0.0 oz/week     Comment: ocassionally     Colonoscopy:  PAP:  Bone  density:  Lipid panel:  Allergies  Allergen Reactions  . Clindamycin Shortness Of Breath  . Levaquin [Levofloxacin] Other (See Comments)    Joint inflammation  . Aripiprazole Other (See Comments)    Facial and head ticks  . Valproic Acid     Other reaction(s): Other (See Comments) Facial twitching    Current Outpatient Prescriptions  Medication Sig Dispense Refill  . calcium carbonate  (OS-CAL) 600 MG TABS tablet Take 600 mg by mouth 2 (two) times daily with a meal.    . clonazePAM (KLONOPIN) 0.5 MG tablet Take 0.5 mg by mouth at bedtime.    . cyanocobalamin (,VITAMIN B-12,) 1000 MCG/ML injection INJECT 1ML INTO THE MUSCLE MONTHLY  6  . furosemide (LASIX) 20 MG tablet Take 20 mg by mouth every other day.     . gabapentin (NEURONTIN) 300 MG capsule Take 300 mg by mouth 3 (three) times daily.    Marland Kitchen HYDROcodone-acetaminophen (NORCO/VICODIN) 5-325 MG tablet Take 1 tablet by mouth every 4 (four) hours as needed for moderate pain. 60 tablet 0  . hydrocortisone (CORTEF) 10 MG tablet Take 10-15 mg by mouth 2 (two) times daily. 1.5 tablets (15 MG) in the morning and 1 tablet (10 MG) in the evening    . levothyroxine (SYNTHROID, LEVOTHROID) 112 MCG tablet Take 112 mcg by mouth daily before breakfast.     . lipase/protease/amylase (CREON) 12000 units CPEP capsule Take 12,000 Units by mouth 3 (three) times daily with meals.    . Metoclopramide HCl (REGLAN PO) Take by mouth.    . metoprolol tartrate (LOPRESSOR) 25 MG tablet Take 12.5 mg by mouth 2 (two) times daily.     . Multiple Vitamin (MULTIVITAMIN WITH MINERALS) TABS tablet Take 1 tablet by mouth daily.    . pantoprazole (PROTONIX) 40 MG tablet Take 40 mg by mouth 2 (two) times daily.     . predniSONE (DELTASONE) 10 MG tablet Take 1 tablet (10 mg total) by mouth daily with breakfast. 30 tablet 0  . venlafaxine XR (EFFEXOR-XR) 150 MG 24 hr capsule 150 mg every morning.     . venlafaxine XR (EFFEXOR-XR) 75 MG 24 hr capsule Take 75 mg by mouth every evening.    Marland Kitchen aspirin-acetaminophen-caffeine (EXCEDRIN MIGRAINE) 250-250-65 MG tablet Take 2 tablets by mouth daily as needed for headache.     No current facility-administered medications for this visit.     OBJECTIVE: Vitals:   11/07/16 1014  BP: 107/67  Pulse: 62  Resp: 18  Temp: (!) 96.9 F (36.1 C)     Body mass index is 25.23 kg/m.    ECOG FS:0 - Asymptomatic  General:  Well-developed, well-nourished, no acute distress. Eyes: Pink conjunctiva, anicteric sclera. Lungs: Clear to auscultation bilaterally. Heart: Regular rate and rhythm. No rubs, murmurs, or gallops. Abdomen: Soft, nontender, nondistended. No organomegaly noted, normoactive bowel sounds. Musculoskeletal: No edema, cyanosis, or clubbing. Mild swelling of joints, no decrease in strength. Mild tenderness to touch. Neuro: Alert, answering all questions appropriately. Cranial nerves grossly intact. Skin: No rash or petechiae noted. Psych: Normal affect.   LAB RESULTS:  Lab Results  Component Value Date   NA 136 11/07/2016   K 4.4 11/07/2016   CL 104 11/07/2016   CO2 27 11/07/2016   GLUCOSE 89 11/07/2016   BUN 15 11/07/2016   CREATININE 2.06 (H) 11/07/2016   CALCIUM 8.1 (L) 11/07/2016   PROT 5.4 (L) 11/07/2016   ALBUMIN 3.0 (L) 11/07/2016   AST 25 11/07/2016   ALT 12 (  L) 11/07/2016   ALKPHOS 77 11/07/2016   BILITOT 0.4 11/07/2016   GFRNONAA 26 (L) 11/07/2016   GFRAA 30 (L) 11/07/2016    Lab Results  Component Value Date   WBC 6.8 11/07/2016   NEUTROABS 5.1 11/07/2016   HGB 10.5 (L) 11/07/2016   HCT 30.2 (L) 11/07/2016   MCV 91.2 11/07/2016   PLT 270 11/07/2016    STUDIES: No results found.  ONCOLOGIC HISTORY: Patient initially diagnosed with metastatic melanoma to right thigh with unknown primary in 2009. She received 4 weeks of high-dose interferon therapy in February 2009 and proceed with low-dose interferon therapy in March 2009. Patient remained in remission until August 2012 when she was noted to have recurrent melanoma and a pelvic lymph node. October 2012 she had 2 cycles of Ipilumimab which was discontinued secondary to poor tolerance. In April 2017 PET scan showed recurrent disease in the right inguinal region which was confirmed by biopsy. Zeboraf discontinued in November 2017 secondary to intolerable rash. Patient initiated combination immunotherapy with nivolumab and  ipilumimab on January 14, 2016. Patient initiated maintenance nivolumab on April 14, 2016.  ASSESSMENT: Recurrent metastatic melanoma, BRAF+, of right lower extremity, metastatic.  PLAN:    1. Recurrent metastatic melanoma, BRAF+, of right lower extremity, metastatic:  PET scan results from October 16, 2016 reviewed independently with continued improvement of disease burden. She completed palliative XRT to her iliac crest lesion on August 11, 2016. Previously, we discussed using 2 mg trametinib (Mekinist) daily. If patient tolerated this well, can attempt combination therapy using dabrafenib/trametinib in the future. Single agent Cobimetinib is also an option. Proceed with cycle 12 of nivoluab today. Return to clinic in 2 weeks for further evaluation and consideration of cycle 13. Will reimage in November 2018.   2. Pain: Patient states she has discontinued Plaquenil.  She continues to take hydrocodone intermittently. 3. Nausea: Continue Compazine as needed. 4. Diarrhea: Possibly related immunotherapy, but given her continued response would be hesitant to discontinue treatment. Patient reports she has a colonoscopy scheduled for November 21, 2016 for further evaluation. Appreciate GI input. 5. Pancreatitis: Unclear etiology. Continue follow-up with GI as indicated.  Approximately 30 minutes was spent in discussion of which greater than 50% was consultation.  Patient expressed understanding and was in agreement with this plan. She also understands that She can call clinic at any time with any questions, concerns, or complaints.    Lloyd Huger, MD 11/07/16 10:48 AM

## 2016-11-07 ENCOUNTER — Other Ambulatory Visit
Admission: RE | Admit: 2016-11-07 | Discharge: 2016-11-07 | Disposition: A | Discharge status: Home / Self Care | Payer: Medicare Other | Source: Ambulatory Visit | Attending: Nurse Practitioner | Admitting: Nurse Practitioner

## 2016-11-07 ENCOUNTER — Inpatient Hospital Stay: Payer: Medicare Other

## 2016-11-07 ENCOUNTER — Inpatient Hospital Stay (HOSPITAL_BASED_OUTPATIENT_CLINIC_OR_DEPARTMENT_OTHER): Payer: Medicare Other | Admitting: Oncology

## 2016-11-07 ENCOUNTER — Inpatient Hospital Stay: Payer: Medicare Other | Attending: Oncology

## 2016-11-07 VITALS — BP 107/67 | HR 62 | Temp 96.9°F | Resp 18 | Wt 161.1 lb

## 2016-11-07 DIAGNOSIS — Z85820 Personal history of malignant melanoma of skin: Secondary | ICD-10-CM

## 2016-11-07 DIAGNOSIS — F17210 Nicotine dependence, cigarettes, uncomplicated: Secondary | ICD-10-CM | POA: Insufficient documentation

## 2016-11-07 DIAGNOSIS — M2550 Pain in unspecified joint: Secondary | ICD-10-CM | POA: Insufficient documentation

## 2016-11-07 DIAGNOSIS — C439 Malignant melanoma of skin, unspecified: Secondary | ICD-10-CM

## 2016-11-07 DIAGNOSIS — K219 Gastro-esophageal reflux disease without esophagitis: Secondary | ICD-10-CM | POA: Diagnosis not present

## 2016-11-07 DIAGNOSIS — E039 Hypothyroidism, unspecified: Secondary | ICD-10-CM | POA: Insufficient documentation

## 2016-11-07 DIAGNOSIS — Z5112 Encounter for antineoplastic immunotherapy: Secondary | ICD-10-CM | POA: Insufficient documentation

## 2016-11-07 DIAGNOSIS — K859 Acute pancreatitis without necrosis or infection, unspecified: Secondary | ICD-10-CM

## 2016-11-07 DIAGNOSIS — Z79899 Other long term (current) drug therapy: Secondary | ICD-10-CM | POA: Diagnosis not present

## 2016-11-07 DIAGNOSIS — C801 Malignant (primary) neoplasm, unspecified: Secondary | ICD-10-CM | POA: Insufficient documentation

## 2016-11-07 DIAGNOSIS — R197 Diarrhea, unspecified: Secondary | ICD-10-CM | POA: Diagnosis not present

## 2016-11-07 DIAGNOSIS — C799 Secondary malignant neoplasm of unspecified site: Secondary | ICD-10-CM

## 2016-11-07 DIAGNOSIS — C4371 Malignant melanoma of right lower limb, including hip: Secondary | ICD-10-CM

## 2016-11-07 DIAGNOSIS — C792 Secondary malignant neoplasm of skin: Secondary | ICD-10-CM | POA: Diagnosis not present

## 2016-11-07 DIAGNOSIS — R110 Nausea: Secondary | ICD-10-CM | POA: Diagnosis not present

## 2016-11-07 DIAGNOSIS — C4370 Malignant melanoma of unspecified lower limb, including hip: Secondary | ICD-10-CM

## 2016-11-07 DIAGNOSIS — C779 Secondary and unspecified malignant neoplasm of lymph node, unspecified: Principal | ICD-10-CM

## 2016-11-07 DIAGNOSIS — I509 Heart failure, unspecified: Secondary | ICD-10-CM | POA: Diagnosis not present

## 2016-11-07 LAB — GASTROINTESTINAL PANEL BY PCR, STOOL (REPLACES STOOL CULTURE)

## 2016-11-07 LAB — CBC WITH DIFFERENTIAL/PLATELET
BASOS ABS: 0.1 10*3/uL (ref 0–0.1)
Basophils Relative: 1 %
Eosinophils Absolute: 0.2 10*3/uL (ref 0–0.7)
Eosinophils Relative: 3 %
HCT: 30.2 % — ABNORMAL LOW (ref 35.0–47.0)
Hemoglobin: 10.5 g/dL — ABNORMAL LOW (ref 12.0–16.0)
LYMPHS PCT: 15 %
Lymphs Abs: 1 10*3/uL (ref 1.0–3.6)
MCH: 31.8 pg (ref 26.0–34.0)
MCHC: 34.9 g/dL (ref 32.0–36.0)
MCV: 91.2 fL (ref 80.0–100.0)
Monocytes Absolute: 0.4 10*3/uL (ref 0.2–0.9)
Monocytes Relative: 6 %
Neutro Abs: 5.1 10*3/uL (ref 1.4–6.5)
Neutrophils Relative %: 75 %
Platelets: 270 10*3/uL (ref 150–440)
RBC: 3.31 MIL/uL — ABNORMAL LOW (ref 3.80–5.20)
RDW: 15.4 % — AB (ref 11.5–14.5)
WBC: 6.8 10*3/uL (ref 3.6–11.0)

## 2016-11-07 LAB — COMPREHENSIVE METABOLIC PANEL
ALT: 12 U/L — ABNORMAL LOW (ref 14–54)
ANION GAP: 5 (ref 5–15)
AST: 25 U/L (ref 15–41)
Albumin: 3 g/dL — ABNORMAL LOW (ref 3.5–5.0)
Alkaline Phosphatase: 77 U/L (ref 38–126)
BUN: 15 mg/dL (ref 6–20)
CALCIUM: 8.1 mg/dL — AB (ref 8.9–10.3)
CHLORIDE: 104 mmol/L (ref 101–111)
CO2: 27 mmol/L (ref 22–32)
CREATININE: 2.06 mg/dL — AB (ref 0.44–1.00)
GFR, EST AFRICAN AMERICAN: 30 mL/min — AB (ref >60)
GFR, EST NON AFRICAN AMERICAN: 26 mL/min — AB (ref >60)
Glucose, Bld: 89 mg/dL (ref 65–99)
Potassium: 4.4 mmol/L (ref 3.5–5.1)
Sodium: 136 mmol/L (ref 135–145)
Total Bilirubin: 0.4 mg/dL (ref 0.3–1.2)
Total Protein: 5.4 g/dL — ABNORMAL LOW (ref 6.5–8.1)

## 2016-11-07 LAB — C DIFFICILE QUICK SCREEN W PCR REFLEX
C Diff antigen: NEGATIVE
C Diff interpretation: NOT DETECTED
C Diff toxin: NEGATIVE

## 2016-11-07 LAB — LIPASE, BLOOD: LIPASE: 14 U/L (ref 11–51)

## 2016-11-07 MED ORDER — SODIUM CHLORIDE 0.9% FLUSH
10.0000 mL | INTRAVENOUS | Status: DC | PRN
  Administered 2016-11-07: 10 mL
  Filled 2016-11-07: qty 10

## 2016-11-07 MED ORDER — SODIUM CHLORIDE 0.9 % IV SOLN
Freq: Once | INTRAVENOUS | Status: AC
  Administered 2016-11-07: 11:00:00 via INTRAVENOUS
  Filled 2016-11-07: qty 1000

## 2016-11-07 MED ORDER — SODIUM CHLORIDE 0.9 % IV SOLN
240.0000 mg | Freq: Once | INTRAVENOUS | Status: AC
  Administered 2016-11-07: 240 mg via INTRAVENOUS
  Filled 2016-11-07: qty 24

## 2016-11-07 MED ORDER — HEPARIN SOD (PORK) LOCK FLUSH 100 UNIT/ML IV SOLN
500.0000 [IU] | Freq: Once | INTRAVENOUS | Status: AC | PRN
  Administered 2016-11-07: 500 [IU]
  Filled 2016-11-07: qty 5

## 2016-11-07 NOTE — Progress Notes (Signed)
Creatinine: 2.06. MD, Dr. Grayland Ormond, notified via telephone. Per MD order: proceed with treatment as scheduled today.

## 2016-11-08 LAB — THYROID PANEL WITH TSH
FREE THYROXINE INDEX: 0.8 — AB (ref 1.2–4.9)
T3 UPTAKE RATIO: 23 % — AB (ref 24–39)
T4, Total: 3.4 ug/dL — ABNORMAL LOW (ref 4.5–12.0)
TSH: 0.271 u[IU]/mL — ABNORMAL LOW (ref 0.450–4.500)

## 2016-11-10 DIAGNOSIS — R9431 Abnormal electrocardiogram [ECG] [EKG]: Secondary | ICD-10-CM | POA: Insufficient documentation

## 2016-11-10 DIAGNOSIS — R000 Tachycardia, unspecified: Secondary | ICD-10-CM | POA: Diagnosis present

## 2016-11-10 DIAGNOSIS — C799 Secondary malignant neoplasm of unspecified site: Secondary | ICD-10-CM | POA: Diagnosis present

## 2016-11-10 DIAGNOSIS — E860 Dehydration: Secondary | ICD-10-CM | POA: Diagnosis present

## 2016-11-10 DIAGNOSIS — Z800 Family history of malignant neoplasm of digestive organs: Secondary | ICD-10-CM | POA: Diagnosis not present

## 2016-11-10 DIAGNOSIS — F319 Bipolar disorder, unspecified: Secondary | ICD-10-CM | POA: Diagnosis present

## 2016-11-10 DIAGNOSIS — I5022 Chronic systolic (congestive) heart failure: Secondary | ICD-10-CM | POA: Diagnosis present

## 2016-11-10 DIAGNOSIS — R197 Diarrhea, unspecified: Secondary | ICD-10-CM | POA: Insufficient documentation

## 2016-11-10 DIAGNOSIS — E039 Hypothyroidism, unspecified: Secondary | ICD-10-CM | POA: Diagnosis not present

## 2016-11-10 DIAGNOSIS — A09 Infectious gastroenteritis and colitis, unspecified: Secondary | ICD-10-CM | POA: Diagnosis present

## 2016-11-10 DIAGNOSIS — Z9221 Personal history of antineoplastic chemotherapy: Secondary | ICD-10-CM | POA: Diagnosis not present

## 2016-11-10 DIAGNOSIS — R1110 Vomiting, unspecified: Secondary | ICD-10-CM | POA: Diagnosis not present

## 2016-11-10 DIAGNOSIS — R112 Nausea with vomiting, unspecified: Secondary | ICD-10-CM | POA: Insufficient documentation

## 2016-11-10 DIAGNOSIS — Z923 Personal history of irradiation: Secondary | ICD-10-CM | POA: Diagnosis not present

## 2016-11-10 DIAGNOSIS — N179 Acute kidney failure, unspecified: Secondary | ICD-10-CM | POA: Insufficient documentation

## 2016-11-10 DIAGNOSIS — F17210 Nicotine dependence, cigarettes, uncomplicated: Secondary | ICD-10-CM | POA: Diagnosis present

## 2016-11-10 DIAGNOSIS — C439 Malignant melanoma of skin, unspecified: Secondary | ICD-10-CM | POA: Diagnosis present

## 2016-11-10 DIAGNOSIS — I4581 Long QT syndrome: Secondary | ICD-10-CM | POA: Diagnosis not present

## 2016-11-10 DIAGNOSIS — Z8041 Family history of malignant neoplasm of ovary: Secondary | ICD-10-CM | POA: Diagnosis not present

## 2016-11-10 DIAGNOSIS — K219 Gastro-esophageal reflux disease without esophagitis: Secondary | ICD-10-CM | POA: Diagnosis present

## 2016-11-10 DIAGNOSIS — N183 Chronic kidney disease, stage 3 (moderate): Secondary | ICD-10-CM | POA: Diagnosis present

## 2016-11-10 DIAGNOSIS — F419 Anxiety disorder, unspecified: Secondary | ICD-10-CM | POA: Diagnosis present

## 2016-11-10 DIAGNOSIS — Z79899 Other long term (current) drug therapy: Secondary | ICD-10-CM | POA: Diagnosis not present

## 2016-11-10 DIAGNOSIS — E2740 Unspecified adrenocortical insufficiency: Secondary | ICD-10-CM | POA: Diagnosis not present

## 2016-11-10 DIAGNOSIS — R05 Cough: Secondary | ICD-10-CM | POA: Diagnosis not present

## 2016-11-10 DIAGNOSIS — Z66 Do not resuscitate: Secondary | ICD-10-CM | POA: Diagnosis present

## 2016-11-11 DIAGNOSIS — R1110 Vomiting, unspecified: Secondary | ICD-10-CM | POA: Diagnosis not present

## 2016-11-11 DIAGNOSIS — E039 Hypothyroidism, unspecified: Secondary | ICD-10-CM | POA: Diagnosis not present

## 2016-11-11 DIAGNOSIS — I5022 Chronic systolic (congestive) heart failure: Secondary | ICD-10-CM | POA: Diagnosis not present

## 2016-11-11 DIAGNOSIS — N183 Chronic kidney disease, stage 3 (moderate): Secondary | ICD-10-CM | POA: Diagnosis not present

## 2016-11-11 DIAGNOSIS — A09 Infectious gastroenteritis and colitis, unspecified: Secondary | ICD-10-CM | POA: Diagnosis not present

## 2016-11-11 DIAGNOSIS — E2740 Unspecified adrenocortical insufficiency: Secondary | ICD-10-CM | POA: Diagnosis not present

## 2016-11-11 DIAGNOSIS — R000 Tachycardia, unspecified: Secondary | ICD-10-CM | POA: Diagnosis not present

## 2016-11-11 DIAGNOSIS — I4581 Long QT syndrome: Secondary | ICD-10-CM | POA: Diagnosis not present

## 2016-11-11 DIAGNOSIS — N179 Acute kidney failure, unspecified: Secondary | ICD-10-CM | POA: Diagnosis not present

## 2016-11-11 DIAGNOSIS — R197 Diarrhea, unspecified: Secondary | ICD-10-CM | POA: Diagnosis not present

## 2016-11-16 LAB — PANCREATIC ELASTASE, FECAL: Pancreatic Elastase-1, Stool: <50 ug Elast./g — ABNORMAL LOW (ref >200)

## 2016-11-18 ENCOUNTER — Encounter: Payer: Self-pay | Admitting: *Deleted

## 2016-11-21 ENCOUNTER — Ambulatory Visit: Payer: Medicare Other | Admitting: Anesthesiology

## 2016-11-21 ENCOUNTER — Ambulatory Visit
Admission: RE | Admit: 2016-11-21 | Discharge: 2016-11-21 | Disposition: A | Discharge status: Home / Self Care | Payer: Medicare Other | Source: Ambulatory Visit | Attending: Unknown Physician Specialty | Admitting: Unknown Physician Specialty

## 2016-11-21 ENCOUNTER — Other Ambulatory Visit: Payer: Self-pay | Admitting: Oncology

## 2016-11-21 ENCOUNTER — Encounter: Payer: Self-pay | Admitting: *Deleted

## 2016-11-21 ENCOUNTER — Encounter
Admission: RE | Disposition: A | Discharge status: Home / Self Care | Payer: Self-pay | Source: Ambulatory Visit | Attending: Unknown Physician Specialty

## 2016-11-21 DIAGNOSIS — K625 Hemorrhage of anus and rectum: Secondary | ICD-10-CM | POA: Insufficient documentation

## 2016-11-21 DIAGNOSIS — E039 Hypothyroidism, unspecified: Secondary | ICD-10-CM | POA: Insufficient documentation

## 2016-11-21 DIAGNOSIS — I110 Hypertensive heart disease with heart failure: Secondary | ICD-10-CM | POA: Insufficient documentation

## 2016-11-21 DIAGNOSIS — Z79899 Other long term (current) drug therapy: Secondary | ICD-10-CM | POA: Diagnosis not present

## 2016-11-21 DIAGNOSIS — K2950 Unspecified chronic gastritis without bleeding: Secondary | ICD-10-CM | POA: Diagnosis not present

## 2016-11-21 DIAGNOSIS — F329 Major depressive disorder, single episode, unspecified: Secondary | ICD-10-CM | POA: Insufficient documentation

## 2016-11-21 DIAGNOSIS — Z800 Family history of malignant neoplasm of digestive organs: Secondary | ICD-10-CM | POA: Insufficient documentation

## 2016-11-21 DIAGNOSIS — K219 Gastro-esophageal reflux disease without esophagitis: Secondary | ICD-10-CM | POA: Insufficient documentation

## 2016-11-21 DIAGNOSIS — Z85820 Personal history of malignant melanoma of skin: Secondary | ICD-10-CM | POA: Diagnosis not present

## 2016-11-21 DIAGNOSIS — K621 Rectal polyp: Secondary | ICD-10-CM | POA: Insufficient documentation

## 2016-11-21 DIAGNOSIS — E230 Hypopituitarism: Secondary | ICD-10-CM | POA: Diagnosis not present

## 2016-11-21 DIAGNOSIS — R1013 Epigastric pain: Secondary | ICD-10-CM | POA: Diagnosis not present

## 2016-11-21 DIAGNOSIS — Z881 Allergy status to other antibiotic agents status: Secondary | ICD-10-CM | POA: Insufficient documentation

## 2016-11-21 DIAGNOSIS — R12 Heartburn: Secondary | ICD-10-CM | POA: Diagnosis not present

## 2016-11-21 DIAGNOSIS — K859 Acute pancreatitis without necrosis or infection, unspecified: Secondary | ICD-10-CM | POA: Diagnosis not present

## 2016-11-21 DIAGNOSIS — Z7982 Long term (current) use of aspirin: Secondary | ICD-10-CM | POA: Insufficient documentation

## 2016-11-21 DIAGNOSIS — F319 Bipolar disorder, unspecified: Secondary | ICD-10-CM | POA: Insufficient documentation

## 2016-11-21 DIAGNOSIS — R634 Abnormal weight loss: Secondary | ICD-10-CM | POA: Diagnosis not present

## 2016-11-21 DIAGNOSIS — Z825 Family history of asthma and other chronic lower respiratory diseases: Secondary | ICD-10-CM | POA: Diagnosis not present

## 2016-11-21 DIAGNOSIS — D128 Benign neoplasm of rectum: Secondary | ICD-10-CM | POA: Diagnosis not present

## 2016-11-21 DIAGNOSIS — K529 Noninfective gastroenteritis and colitis, unspecified: Secondary | ICD-10-CM | POA: Insufficient documentation

## 2016-11-21 DIAGNOSIS — F17210 Nicotine dependence, cigarettes, uncomplicated: Secondary | ICD-10-CM | POA: Diagnosis not present

## 2016-11-21 DIAGNOSIS — K5289 Other specified noninfective gastroenteritis and colitis: Secondary | ICD-10-CM | POA: Diagnosis not present

## 2016-11-21 DIAGNOSIS — K2970 Gastritis, unspecified, without bleeding: Secondary | ICD-10-CM | POA: Diagnosis not present

## 2016-11-21 DIAGNOSIS — I509 Heart failure, unspecified: Secondary | ICD-10-CM | POA: Insufficient documentation

## 2016-11-21 DIAGNOSIS — K635 Polyp of colon: Secondary | ICD-10-CM | POA: Diagnosis not present

## 2016-11-21 DIAGNOSIS — K317 Polyp of stomach and duodenum: Secondary | ICD-10-CM | POA: Diagnosis not present

## 2016-11-21 DIAGNOSIS — E2740 Unspecified adrenocortical insufficiency: Secondary | ICD-10-CM | POA: Diagnosis not present

## 2016-11-21 DIAGNOSIS — K2960 Other gastritis without bleeding: Secondary | ICD-10-CM | POA: Diagnosis not present

## 2016-11-21 HISTORY — DX: Unspecified adrenocortical insufficiency: E27.40

## 2016-11-21 HISTORY — DX: Supraglottitis, unspecified, without obstruction: J04.30

## 2016-11-21 HISTORY — PX: COLONOSCOPY WITH PROPOFOL: SHX5780

## 2016-11-21 HISTORY — DX: Essential (primary) hypertension: I10

## 2016-11-21 HISTORY — DX: Depression, unspecified: F32.A

## 2016-11-21 HISTORY — DX: Cardiomyopathy, unspecified: I42.9

## 2016-11-21 HISTORY — DX: Hemorrhage of anus and rectum: K62.5

## 2016-11-21 HISTORY — DX: Major depressive disorder, single episode, unspecified: F32.9

## 2016-11-21 HISTORY — DX: Pain in unspecified joint: M25.50

## 2016-11-21 HISTORY — DX: Hypopituitarism: E23.0

## 2016-11-21 HISTORY — DX: Overactive bladder: N32.81

## 2016-11-21 HISTORY — DX: Bipolar disorder, unspecified: F31.9

## 2016-11-21 HISTORY — DX: Abnormal weight loss: R63.4

## 2016-11-21 HISTORY — DX: Age-related osteoporosis without current pathological fracture: M81.0

## 2016-11-21 HISTORY — DX: Acute pancreatitis without necrosis or infection, unspecified: K85.90

## 2016-11-21 HISTORY — PX: ESOPHAGOGASTRODUODENOSCOPY (EGD) WITH PROPOFOL: SHX5813

## 2016-11-21 SURGERY — ESOPHAGOGASTRODUODENOSCOPY (EGD) WITH PROPOFOL
Anesthesia: General

## 2016-11-21 MED ORDER — MIDAZOLAM HCL 2 MG/2ML IJ SOLN
INTRAMUSCULAR | Status: AC
  Filled 2016-11-21: qty 2

## 2016-11-21 MED ORDER — LIDOCAINE HCL (PF) 2 % IJ SOLN
INTRAMUSCULAR | Status: AC
Start: 2016-11-21 — End: ?
  Filled 2016-11-21: qty 4

## 2016-11-21 MED ORDER — FENTANYL CITRATE (PF) 100 MCG/2ML IJ SOLN
INTRAMUSCULAR | Status: DC | PRN
  Administered 2016-11-21: 50 ug via INTRAVENOUS

## 2016-11-21 MED ORDER — SODIUM CHLORIDE 0.9 % IV SOLN: INTRAVENOUS | Status: DC

## 2016-11-21 MED ORDER — FENTANYL CITRATE (PF) 100 MCG/2ML IJ SOLN
INTRAMUSCULAR | Status: AC
  Filled 2016-11-21: qty 2

## 2016-11-21 MED ORDER — LIDOCAINE 2% (20 MG/ML) 5 ML SYRINGE
INTRAMUSCULAR | Status: DC | PRN
  Administered 2016-11-21: 40 mg via INTRAVENOUS

## 2016-11-21 MED ORDER — PIPERACILLIN-TAZOBACTAM 3.375 G IVPB
3.3750 g | Freq: Once | INTRAVENOUS | Status: AC
  Administered 2016-11-21: 3.375 g via INTRAVENOUS

## 2016-11-21 MED ORDER — PROPOFOL 10 MG/ML IV BOLUS
INTRAVENOUS | Status: DC | PRN
  Administered 2016-11-21: 100 mg via INTRAVENOUS

## 2016-11-21 MED ORDER — SODIUM CHLORIDE 0.9 % IV SOLN
INTRAVENOUS | Status: DC
  Administered 2016-11-21: 1000 mL via INTRAVENOUS
  Administered 2016-11-21: 14:00:00 via INTRAVENOUS

## 2016-11-21 MED ORDER — PROPOFOL 500 MG/50ML IV EMUL
INTRAVENOUS | Status: DC | PRN
  Administered 2016-11-21: 140 ug/kg/min via INTRAVENOUS

## 2016-11-21 MED ORDER — PROPOFOL 500 MG/50ML IV EMUL
INTRAVENOUS | Status: AC
  Filled 2016-11-21: qty 50

## 2016-11-21 MED ORDER — MIDAZOLAM HCL 5 MG/5ML IJ SOLN
INTRAMUSCULAR | Status: DC | PRN
  Administered 2016-11-21 (×2): 1 mg via INTRAVENOUS

## 2016-11-21 MED ORDER — PROPOFOL 10 MG/ML IV BOLUS
INTRAVENOUS | Status: AC
  Filled 2016-11-21: qty 20

## 2016-11-21 NOTE — Anesthesia Preprocedure Evaluation (Signed)
Anesthesia Evaluation  Patient identified by MRN, date of birth, ID band Patient awake    Reviewed: Allergy & Precautions, H&P , NPO status , Patient's Chart, lab work & pertinent test results, reviewed documented beta blocker date and time   Airway Mallampati: II   Neck ROM: full    Dental  (+) Poor Dentition   Pulmonary neg pulmonary ROS, Current Smoker,    Pulmonary exam normal        Cardiovascular Exercise Tolerance: Poor hypertension, On Medications +CHF  negative cardio ROS Normal cardiovascular exam Rhythm:regular Rate:Normal     Neuro/Psych  Headaches,  Neuromuscular disease negative neurological ROS  negative psych ROS   GI/Hepatic negative GI ROS, Neg liver ROS, GERD  Medicated,  Endo/Other  negative endocrine ROSHypothyroidism   Renal/GU Renal diseasenegative Renal ROS  negative genitourinary   Musculoskeletal   Abdominal   Peds  Hematology negative hematology ROS (+)   Anesthesia Other Findings Past Medical History: No date: Adrenal insufficiency (HCC) No date: Bipolar disorder ([place]) No date: Cardiomyopathy [facility]) 2016: CHF (congestive heart failure) ([place])     Comment:  Dr. 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[facility]) 2012: Personal history of malignant melanoma of skin No date: Rectal bleeding No date: Supraglottitis No date: Tendonitis 07/20/2015: Tick bite of head No date: Weight loss, unintentional Past Surgical History: 2010: [place]: BREAST CYST EXCISION 07/2012: COLONOSCOPY     Comment:  [facility] No date:  ESOPHAGOGASTRODUODENOSCOPY No date: FRACTURE SURGERY     Comment:  rod removed from left femur 07/21/2014 07/17/2015: INGUINAL LYMPH NODE BIOPSY; Right     Comment:  Procedure: INGUINAL LYMPH NODE BIOPSY/EXCISION;                Surgeon: Robert Bellow, MD;  Location: [facility] ORS;                Service: General;  Laterality: Right; 12/26/2008: laminnotomy reexploration posterior lumbar      Comment:  with nerve decomp and discectomy 2008: MELANOMA EXCISION     Comment:  right thigh 03/14/2016: PORTACATH PLACEMENT; Left     Comment:  Procedure: INSERTION PORT-A-CATH;  Surgeon: Robert Bellow, MD;  Location: [facility] ORS;  Service: General;                Laterality: Left; 12-19-10: SKIN LESION EXCISION; Right     Comment:  posterior No date: SPINE SURGERY   Reproductive/Obstetrics negative OB ROS                             Anesthesia Physical Anesthesia Plan  ASA: III  Anesthesia Plan: General   Post-op Pain Management:    Induction:   PONV Risk Score and Plan:   Airway Management Planned:   Additional Equipment:   Intra-op Plan:   Post-operative Plan:   Informed Consent: I have reviewed the patients History and Physical, chart, labs and discussed the procedure including the risks, benefits and alternatives for the proposed anesthesia with the patient or authorized representative who has indicated his/her understanding and acceptance.  Dental Advisory Given  Plan Discussed with: CRNA  Anesthesia Plan Comments:         Anesthesia Quick Evaluation

## 2016-11-21 NOTE — Op Note (Signed)
[facility] Gastroenterology Patient Name: Patricia Li Procedure Date: 11/21/2016 1:42 PM MRN: [id number] Account #: [id number] Date of Birth: [birth year] Admit Type: Outpatient Age: 56 Room: [facility] ENDO ROOM 3 Gender: Female Note Status: Finalized Procedure:            Upper GI endoscopy Indications:          Heartburn, Suspected gastro-esophageal reflux disease Providers:            Manya Silvas, MD Referring MD:         No Local Md, MD (Referring MD) Medicines:            Propofol per Anesthesia Complications:        No immediate complications. Procedure:            Pre-Anesthesia Assessment:                       - After reviewing the risks and benefits, the patient                        was deemed in satisfactory condition to undergo the                        procedure.                       After obtaining informed consent, the endoscope was                        passed under direct vision. Throughout the procedure,                        the patient's blood pressure, pulse, and oxygen                        saturations were monitored continuously. The Endoscope                        was introduced through the mouth, and advanced to the                        second part of duodenum. The upper GI endoscopy was                        accomplished without difficulty. The patient tolerated                        the procedure well. Findings:      The examined esophagus was normal. GEJ 38cm.      Diffuse and patchy mild inflammation characterized by erythema and       granularity was found in the gastric antrum. Biopsies were taken with a       cold forceps for histology. Biopsies were taken with a cold forceps for       Helicobacter pylori testing.      The examined duodenum was normal. Impression:           - Normal esophagus.                       - Gastritis. Biopsied.                       -  Normal examined duodenum. Recommendation:       -  Await pathology results. Manya Silvas, MD 11/21/2016 1:57:40 PM This report has been signed electronically. Number of Addenda: 0 Note Initiated On: 11/21/2016 1:42 PM      [facility]

## 2016-11-21 NOTE — H&P (Signed)
Primary Care Physician:  Patient, No Pcp Per Primary Gastroenterologist:  Dr. Vira Agar  Pre-Procedure History & Physical: HPI:  Patricia Li is a 56 y.o. female is here for an endoscopy and colonoscopy.   Past Medical History:  Diagnosis Date  . Adrenal insufficiency ([place])   . Bipolar disorder ([place])   . Cardiomyopathy ([place])   . CHF (congestive heart failure) ([place]) 2016   Dr. Nehemiah Massed  . Depression   . GERD (gastroesophageal reflux disease) 2012  . Hypertension   . Hypothyroidism   . Joint pain   . Lumbar herniated disc 2010  . Malignant melanoma of skin of lower limb, including hip ([place]) 2008, 2017   right thigh  . Osteoporosis, postmenopausal   . Overactive bladder   . Pancreatitis   . Panhypopituitarism ([place])   . Personal history of malignant melanoma of skin 2012  . Rectal bleeding   . Supraglottitis   . Tendonitis   . Tick bite of head 07/20/2015  . Weight loss, unintentional     Past Surgical History:  Procedure Laterality Date  . BACK SURGERY  2010  . BREAST CYST EXCISION  1985  . COLONOSCOPY  07/2012   [facility]  . ESOPHAGOGASTRODUODENOSCOPY    . FRACTURE SURGERY     rod removed from left femur 07/21/2014  . INGUINAL LYMPH NODE BIOPSY Right 07/17/2015   Procedure: INGUINAL LYMPH NODE BIOPSY/EXCISION;  Surgeon: Sadey Yandell Bellow, MD;  Location: [facility] ORS;  Service: General;  Laterality: Right;  . laminnotomy reexploration posterior lumbar   12/26/2008   with nerve decomp and discectomy  . MELANOMA EXCISION  2008   right thigh  . PORTACATH PLACEMENT Left 03/14/2016   Procedure: INSERTION PORT-A-CATH;  Surgeon: Tihanna Goodson Bellow, MD;  Location: [facility] ORS;  Service: General;  Laterality: Left;  . SKIN LESION EXCISION Right 12-19-10   posterior  . SPINE SURGERY      Prior to Admission medications   Medication Sig Start Date End Date Taking? Authorizing Provider  aspirin-acetaminophen-caffeine (EXCEDRIN MIGRAINE) [phone] MG tablet Take 2 tablets by mouth daily  as needed for headache.   Yes [provider]  calcium carbonate (OS-CAL) 600 MG TABS tablet Take 600 mg by mouth 2 (two) times daily with a meal.   Yes [provider]  clonazePAM (KLONOPIN) 0.5 MG tablet Take 0.5 mg by mouth at bedtime. 03/08/16  Yes [provider]  cyanocobalamin (,VITAMIN B-12,) 1000 MCG/ML injection INJECT 1ML INTO THE MUSCLE MONTHLY 10/15/16  Yes [provider]  dexamethasone (DECADRON) 4 MG tablet Take 4 mg by mouth 2 (two) times daily with a meal.   Yes [provider]  diclofenac sodium (VOLTAREN) 1 % GEL Apply 2 g topically 4 (four) times daily.   Yes [provider]  furosemide (LASIX) 20 MG tablet Take 20 mg by mouth every other day.    Yes [provider]  gabapentin (NEURONTIN) 300 MG capsule Take 300 mg by mouth 3 (three) times daily.   Yes [provider]  HYDROcodone-acetaminophen (NORCO/VICODIN) 5-325 MG tablet Take 1 tablet by mouth every 4 (four) hours as needed for moderate pain. 08/25/16  Yes Lloyd Huger, MD  hydrocortisone (CORTEF) 10 MG tablet Take 10-15 mg by mouth 2 (two) times daily. 1.5 tablets (15 MG) in the morning and 1 tablet (10 MG) in the evening   Yes [provider]  levothyroxine (SYNTHROID, LEVOTHROID) 112 MCG tablet Take 112 mcg by mouth daily before breakfast.  03/20/12  Yes [provider]  lipase/protease/amylase (CREON) 12000 units CPEP capsule Take 12,000 Units by mouth 3 (three) times daily with meals.   Yes [provider]  Metoclopramide HCl (REGLAN PO) Take by mouth.   Yes [provider]  metoprolol tartrate (LOPRESSOR) 25 MG tablet Take 12.5 mg by mouth 2 (two) times daily.    Yes [provider]  Multiple Vitamin (MULTIVITAMIN WITH MINERALS) TABS tablet Take 1 tablet by mouth daily.   Yes [provider]  ondansetron (ZOFRAN) 4 MG tablet Take 4 mg by mouth every 8 (eight) hours as needed for nausea or  vomiting.   Yes [provider]  pantoprazole (PROTONIX) 40 MG tablet Take 40 mg by mouth 2 (two) times daily.    Yes [provider]  predniSONE (DELTASONE) 10 MG tablet TAKE 1 TABLET (10 MG TOTAL) BY MOUTH DAILY WITH BREAKFAST. 11/21/16  Yes Finnegan, Kathlene November, MD  rifampin (RIFADIN) 300 MG capsule Take 300 mg by mouth daily.   Yes [provider]  venlafaxine XR (EFFEXOR-XR) 150 MG 24 hr capsule 150 mg every morning.  05/06/12  Yes [provider]  venlafaxine XR (EFFEXOR-XR) 75 MG 24 hr capsule Take 75 mg by mouth every evening. 03/08/16  Yes [provider]    Allergies as of 08/26/2016 - Review Complete 08/10/2016  Allergen Reaction Noted  . Clindamycin Shortness Of Breath 06/17/2015  . Levaquin [levofloxacin] Other (See Comments) 03/14/2016  . Aripiprazole Other (See Comments) 06/17/2015  . Valproic acid  06/17/2015    Family History  Problem Relation Age of Onset  . COPD Father   . Cancer Sister        Colon, sister    Social History   Social History  . Marital status: Divorced    Spouse name: N/A  . Number of children: N/A  . Years of education: N/A   Occupational History  . Not on file.   Social History Main Topics  . Smoking status: Current Every Day Smoker    Packs/day: 0.50    Years: 20.00    Types: Cigarettes  . Smokeless tobacco: Never Used  . Alcohol use 0.0 oz/week     Comment: ocassionally  . Drug use: No  . Sexual activity: Not on file   Other Topics Concern  . Not on file   Social History Narrative  . No narrative on file    Review of Systems: See HPI, otherwise negative ROS  Physical Exam: BP 133/84   Pulse 69   Temp (!) 96.7 F (35.9 C) (Tympanic)   Resp 18   Ht 5\' 7"  (1.702 m)   Wt 68 kg (150 lb)   SpO2 100%   BMI 23.49 kg/m  General:   Alert,  pleasant and cooperative in NAD Head:  Normocephalic and atraumatic. Neck:  Supple; no masses or thyromegaly. Lungs:  Clear throughout to  auscultation.    Heart:  Regular rate and rhythm. Abdomen:  Soft, nontender and nondistended. Normal bowel sounds, without guarding, and without rebound.   Neurologic:  Alert and  oriented x4;  grossly normal neurologically.  Impression/Plan: Patricia Li is here for an endoscopy and colonoscopy to be performed for weight loss, rectal bleeding, GERD.  Risks, benefits, limitations, and alternatives regarding  endoscopy and colonoscopy have been reviewed with the patient.  Questions have been answered.  All parties agreeable.   Gaylyn Cheers, MD  11/21/2016, 1:39 PM

## 2016-11-21 NOTE — Progress Notes (Signed)
[place]  [phone] [phone]  ID: Patricia Li OB: 10/15/1960  MR#: [id number]  QBV#:[id number]  Patient Care Team: Patient, No Pcp Per as PCP - General ([place]) Bary Castilla, Forest Gleason, MD as Consulting Physician (General Surgery) Forest Gleason, MD (Oncology) Lloyd Huger, MD as Consulting Physician (Oncology)  CHIEF COMPLAINT: Recurrent metastatic melanoma, BRAF+, of right lower extremity, metastatic.  INTERVAL HISTORY: Patient returns to clinic today for further evaluation and consideration of cycle 13 of maintenance nivolumab. She continues to have persistent diarrhea 5-10 times per day. Steroids are unhelpful. Recent colonoscopy and EGD were unrevealing. She is depressed and tearful secondary to her diarrhea. She has no neurological complaints. She denies any recent fevers or illnesses. She has a poor appetite. She denies any chest pain or shortness of breath. She has no nausea, vomiting, or constipation. She has no urinary complaints. Patient feels generally terrible, but offers no further specific complaints today  REVIEW OF SYSTEMS:   Review of Systems  Constitutional: Positive for malaise/fatigue and weight loss. Negative for fever.  HENT: Negative.  Negative for ear pain and tinnitus.   Eyes: Negative.   Respiratory: Negative for cough and shortness of breath.   Cardiovascular: Negative.  Negative for chest pain and leg swelling.  Gastrointestinal: Positive for diarrhea. Negative for abdominal pain, blood in stool, heartburn and melena.  Genitourinary: Negative.   Musculoskeletal: Positive for joint pain.  Skin: Negative for itching and rash.  Neurological: Positive for weakness. Negative for sensory change.  Endo/Heme/Allergies: Does not bruise/bleed easily.  Psychiatric/Behavioral: Positive for depression. The patient has insomnia. The patient is not nervous/anxious.     As per HPI. Otherwise, a complete review  of systems is negative.  PAST MEDICAL HISTORY: Past Medical History:  Diagnosis Date  . Adrenal insufficiency ([place])   . Bipolar disorder ([place])   . Cardiomyopathy ([place])   . CHF (congestive heart failure) ([place]) 2016   Dr. Nehemiah Massed  . Depression   . GERD (gastroesophageal reflux disease) 2012  . Hypertension   . Hypothyroidism   . Joint pain   . Lumbar herniated disc 2010  . Malignant melanoma of skin of lower limb, including hip ([place]) 2008, 2017   right thigh  . Osteoporosis, postmenopausal   . Overactive bladder   . Pancreatitis   . Panhypopituitarism ([place])   . Personal history of malignant melanoma of skin 2012  . Rectal bleeding   . Supraglottitis   . Tendonitis   . Tick bite of head 07/20/2015  . Weight loss, unintentional     PAST SURGICAL HISTORY: Past Surgical History:  Procedure Laterality Date  . BACK SURGERY  2010  . BREAST CYST EXCISION  1985  . COLONOSCOPY  07/2012   [facility]  . COLONOSCOPY WITH PROPOFOL N/A 11/21/2016   Procedure: COLONOSCOPY WITH PROPOFOL;  Surgeon: Manya Silvas, MD;  Location: [facility] ENDOSCOPY;  Service: Endoscopy;  Laterality: N/A;  . ESOPHAGOGASTRODUODENOSCOPY    . ESOPHAGOGASTRODUODENOSCOPY (EGD) WITH PROPOFOL N/A 11/21/2016   Procedure: ESOPHAGOGASTRODUODENOSCOPY (EGD) WITH PROPOFOL;  Surgeon: Manya Silvas, MD;  Location: [facility] ENDOSCOPY;  Service: Endoscopy;  Laterality: N/A;  . FRACTURE SURGERY     rod removed from left femur 07/21/2014  . INGUINAL LYMPH NODE BIOPSY Right 07/17/2015   Procedure: INGUINAL LYMPH NODE BIOPSY/EXCISION;  Surgeon: Robert Bellow, MD;  Location: [facility] ORS;  Service: General;  Laterality: Right;  . laminnotomy reexploration posterior lumbar   12/26/2008   with nerve decomp and discectomy  .  MELANOMA EXCISION  2008   right thigh  . PORTACATH PLACEMENT Left 03/14/2016   Procedure: INSERTION PORT-A-CATH;  Surgeon: Robert Bellow, MD;  Location: [facility] ORS;  Service: General;  Laterality: Left;  . SKIN  LESION EXCISION Right 12-19-10   posterior  . SPINE SURGERY      FAMILY HISTORY: Family History  Problem Relation Age of Onset  . COPD Father   . Cancer Sister        Colon, sister       ADVANCED DIRECTIVES:    HEALTH MAINTENANCE: Social History  Substance Use Topics  . Smoking status: Current Every Day Smoker    Packs/day: 0.50    Years: 20.00    Types: Cigarettes  . Smokeless tobacco: Never Used  . Alcohol use 0.0 oz/week     Comment: ocassionally     Colonoscopy:  PAP:  Bone density:  Lipid panel:  Allergies  Allergen Reactions  . Clindamycin Shortness Of Breath  . Levaquin [Levofloxacin] Other (See Comments)    Joint inflammation  . Aripiprazole Other (See Comments)    Facial and head ticks  . Depakote [Divalproex Sodium] Other (See Comments)  . Valproic Acid     Other reaction(s): Other (See Comments) Facial twitching    Current Outpatient Prescriptions  Medication Sig Dispense Refill  . aspirin-acetaminophen-caffeine (EXCEDRIN MIGRAINE) 250-250-65 MG tablet Take 2 tablets by mouth daily as needed for headache.    . calcium carbonate (OS-CAL) 600 MG TABS tablet Take 600 mg by mouth 2 (two) times daily with a meal.    . clonazePAM (KLONOPIN) 0.5 MG tablet Take 0.5 mg by mouth at bedtime.    . cyanocobalamin (,VITAMIN B-12,) 1000 MCG/ML injection INJECT 1ML INTO THE MUSCLE MONTHLY  6  . dexamethasone (DECADRON) 4 MG tablet Take 4 mg by mouth 2 (two) times daily with a meal.    . diclofenac sodium (VOLTAREN) 1 % GEL Apply 2 g topically 4 (four) times daily.    . furosemide (LASIX) 20 MG tablet Take 20 mg by mouth every other day.     . gabapentin (NEURONTIN) 300 MG capsule Take 300 mg by mouth 3 (three) times daily.    Marland Kitchen HYDROcodone-acetaminophen (NORCO/VICODIN) 5-325 MG tablet Take 1 tablet by mouth every 4 (four) hours as needed for moderate pain. 60 tablet 0  . hydrocortisone (CORTEF) 10 MG tablet Take 10-15 mg by mouth 2 (two) times daily. 1.5 tablets  (15 MG) in the morning and 1 tablet (10 MG) in the evening    . levothyroxine (SYNTHROID, LEVOTHROID) 112 MCG tablet Take 112 mcg by mouth daily before breakfast.     . Metoclopramide HCl (REGLAN PO) Take by mouth.    . metoprolol tartrate (LOPRESSOR) 25 MG tablet Take 12.5 mg by mouth 2 (two) times daily.     . Multiple Vitamin (MULTIVITAMIN WITH MINERALS) TABS tablet Take 1 tablet by mouth daily.    . ondansetron (ZOFRAN) 4 MG tablet Take 4 mg by mouth every 8 (eight) hours as needed for nausea or vomiting.    . pantoprazole (PROTONIX) 40 MG tablet Take 40 mg by mouth 2 (two) times daily.     . predniSONE (DELTASONE) 10 MG tablet TAKE 1 TABLET (10 MG TOTAL) BY MOUTH DAILY WITH BREAKFAST. 30 tablet 0  . rifampin (RIFADIN) 300 MG capsule Take 300 mg by mouth daily.    Marland Kitchen venlafaxine XR (EFFEXOR-XR) 150 MG 24 hr capsule 150 mg every morning.     . venlafaxine  XR (EFFEXOR-XR) 75 MG 24 hr capsule Take 75 mg by mouth every evening.    . lipase/protease/amylase (CREON) 12000 units CPEP capsule Take 12,000 Units by mouth 3 (three) times daily with meals.     No current facility-administered medications for this visit.     OBJECTIVE: Vitals:   11/22/16 1123  BP: 132/82  Pulse: 65  Temp: (!) 97 F (36.1 C)     Body mass index is 24.2 kg/m.    ECOG FS:0 - Asymptomatic  General: Well-developed, well-nourished, no acute distress. Eyes: Pink conjunctiva, anicteric sclera. Lungs: Clear to auscultation bilaterally. Heart: Regular rate and rhythm. No rubs, murmurs, or gallops. Abdomen: Soft, nontender, nondistended. No organomegaly noted, normoactive bowel sounds. Musculoskeletal: No edema, cyanosis, or clubbing. Mild swelling of joints, no decrease in strength. Mild tenderness to touch. Neuro: Alert, answering all questions appropriately. Cranial nerves grossly intact. Skin: No rash or petechiae noted. Psych: Normal affect.   LAB RESULTS:  Lab Results  Component Value Date   NA 137  11/22/2016   K 4.1 11/22/2016   CL 106 11/22/2016   CO2 24 11/22/2016   GLUCOSE 121 (H) 11/22/2016   BUN 16 11/22/2016   CREATININE 1.81 (H) 11/22/2016   CALCIUM 8.1 (L) 11/22/2016   PROT 5.6 (L) 11/22/2016   ALBUMIN 3.2 (L) 11/22/2016   AST 29 11/22/2016   ALT 16 11/22/2016   ALKPHOS 95 11/22/2016   BILITOT 0.4 11/22/2016   GFRNONAA 31 (L) 11/22/2016   GFRAA 35 (L) 11/22/2016    Lab Results  Component Value Date   WBC 8.9 11/22/2016   NEUTROABS 7.6 (H) 11/22/2016   HGB 11.0 (L) 11/22/2016   HCT 32.0 (L) 11/22/2016   MCV 92.8 11/22/2016   PLT 400 11/22/2016    STUDIES: No results found.  ONCOLOGIC HISTORY: Patient initially diagnosed with metastatic melanoma to right thigh with unknown primary in 2009. She received 4 weeks of high-dose interferon therapy in February 2009 and proceed with low-dose interferon therapy in March 2009. Patient remained in remission until August 2012 when she was noted to have recurrent melanoma and a pelvic lymph node. October 2012 she had 2 cycles of Ipilumimab which was discontinued secondary to poor tolerance. In April 2017 PET scan showed recurrent disease in the right inguinal region which was confirmed by biopsy. Zeboraf discontinued in November 2017 secondary to intolerable rash. Patient initiated combination immunotherapy with nivolumab and ipilumimab on January 14, 2016. Patient initiated maintenance nivolumab on April 14, 2016 and discontinued on November 07, 2016.  ASSESSMENT: Recurrent metastatic melanoma, BRAF+, of right lower extremity, metastatic.  PLAN:    1. Recurrent metastatic melanoma, BRAF+, of right lower extremity, metastatic:  PET scan results from October 16, 2016 reviewed independently with continued improvement of disease burden. She completed palliative XRT to her iliac crest lesion on August 11, 2016. Because of patient's persistent grade 3 diarrhea will not pursue any additional treatments with nivolumab. Patient will  return to clinic next week for Remicade and then she will be switched to 2 mg trametinib ([place]inist) daily. If patient tolerated this well, can attempt combination therapy using dabrafenib/trametinib in the future. Single agent Cobimetinib is also an option. Return to clinic next week for further evaluation and Remicade.   2. Pain: Patient states she has discontinued Plaquenil.  She continues to take hydrocodone intermittently. 3. Nausea: Continue Compazine as needed. 4. Diarrhea: At least grade 3 and refractory to steroids. Colonoscopy and EGD were unrevealing. Proceed with Remicade as above. Appreciate  GI input. 5. Pancreatitis: Unclear etiology. Continue follow-up with GI as indicated.  Approximately 30 minutes was spent in discussion of which greater than 50% was consultation.  Patient expressed understanding and was in agreement with this plan. She also understands that She can call clinic at any time with any questions, concerns, or complaints.    Lloyd Huger, MD 11/25/16 1:57 PM

## 2016-11-21 NOTE — Op Note (Signed)
[facility] Gastroenterology Patient Name: Patricia Li Procedure Date: 11/21/2016 1:41 PM MRN: [id number] Account #: [id number] Date of Birth: 07/28/1960 Admit Type: Outpatient Age: 56 Room: [facility] ENDO ROOM 3 Gender: Female Note Status: Finalized Procedure:            Colonoscopy Indications:          Chronic diarrhea, Rectal bleeding Providers:            Manya Silvas, MD Referring MD:         No Local Md, MD (Referring MD) Medicines:            Propofol per Anesthesia Complications:        No immediate complications. Procedure:            Pre-Anesthesia Assessment:                       - After reviewing the risks and benefits, the patient                        was deemed in satisfactory condition to undergo the                        procedure.                       After obtaining informed consent, the colonoscope was                        passed under direct vision. Throughout the procedure,                        the patient's blood pressure, pulse, and oxygen                        saturations were monitored continuously. The                        Colonoscope was introduced through the anus and                        advanced to the the cecum, identified by appendiceal                        orifice and ileocecal valve. The colonoscopy was                        performed without difficulty. The patient tolerated the                        procedure well. The quality of the bowel preparation                        was adequate to identify polyps. Findings:      A diminutive polyp was found in the rectum. The polyp was sessile. The       polyp was removed with a jumbo cold forceps. Resection and retrieval       were complete.      Normal mucosa was found in the rectum, in the sigmoid colon, in the       descending colon, in the transverse colon, in the ascending colon and in  the cecum. Biopsies for histology were taken with a cold forceps  from       the right colon and left colon for evaluation of possible. microscopic       colitis.      There were tiny gobules of fat seen in the colon consistent with chronic       pancreatitis. Impression:           - One diminutive polyp in the rectum, removed with a                        jumbo cold forceps. Resected and retrieved.                       - Normal mucosa in the rectum, in the sigmoid colon, in                        the descending colon, in the transverse colon, in the                        ascending colon and in the cecum. Biopsied. Recommendation:       - Await pathology results. Manya Silvas, MD 11/21/2016 2:31:51 PM This report has been signed electronically. Number of Addenda: 0 Note Initiated On: 11/21/2016 1:41 PM Scope Withdrawal Time: 0 hours 10 minutes 56 seconds  Total Procedure Duration: 0 hours 23 minutes 12 seconds       [facility]

## 2016-11-21 NOTE — Anesthesia Post-op Follow-up Note (Signed)
Anesthesia QCDR form completed.        

## 2016-11-21 NOTE — Transfer of Care (Signed)
Immediate Anesthesia Transfer of Care Note  Patient: Patricia Li  Procedure(s) Performed: Procedure(s): ESOPHAGOGASTRODUODENOSCOPY (EGD) WITH PROPOFOL (N/A) COLONOSCOPY WITH PROPOFOL (N/A)  Patient Location: PACU and Endoscopy Unit  Anesthesia Type:General  Level of Consciousness: awake and patient cooperative  Airway & Oxygen Therapy: Patient Spontanous Breathing and Patient connected to nasal cannula oxygen  Post-op Assessment: Report given to RN and Post -op Vital signs reviewed and stable  Post vital signs: Reviewed and stable  Last Vitals:  Vitals:   11/21/16 1256  BP: 133/84  Pulse: 69  Resp: 18  Temp: (!) 35.9 C  SpO2: 100%    Last Pain:  Vitals:   11/21/16 1256  TempSrc: Tympanic         Complications: No apparent anesthesia complications

## 2016-11-22 ENCOUNTER — Inpatient Hospital Stay: Payer: Medicare Other

## 2016-11-22 ENCOUNTER — Encounter: Payer: Self-pay | Admitting: Oncology

## 2016-11-22 ENCOUNTER — Inpatient Hospital Stay (HOSPITAL_BASED_OUTPATIENT_CLINIC_OR_DEPARTMENT_OTHER): Payer: Medicare Other | Admitting: Oncology

## 2016-11-22 VITALS — BP 132/82 | HR 65 | Temp 97.0°F | Wt 154.5 lb

## 2016-11-22 DIAGNOSIS — I509 Heart failure, unspecified: Secondary | ICD-10-CM | POA: Diagnosis not present

## 2016-11-22 DIAGNOSIS — Z5112 Encounter for antineoplastic immunotherapy: Secondary | ICD-10-CM | POA: Diagnosis not present

## 2016-11-22 DIAGNOSIS — K859 Acute pancreatitis without necrosis or infection, unspecified: Secondary | ICD-10-CM | POA: Diagnosis not present

## 2016-11-22 DIAGNOSIS — R110 Nausea: Secondary | ICD-10-CM | POA: Diagnosis not present

## 2016-11-22 DIAGNOSIS — C439 Malignant melanoma of skin, unspecified: Secondary | ICD-10-CM

## 2016-11-22 DIAGNOSIS — R197 Diarrhea, unspecified: Secondary | ICD-10-CM | POA: Diagnosis not present

## 2016-11-22 DIAGNOSIS — C801 Malignant (primary) neoplasm, unspecified: Secondary | ICD-10-CM

## 2016-11-22 DIAGNOSIS — Z79899 Other long term (current) drug therapy: Secondary | ICD-10-CM

## 2016-11-22 DIAGNOSIS — M2550 Pain in unspecified joint: Secondary | ICD-10-CM | POA: Diagnosis not present

## 2016-11-22 DIAGNOSIS — K219 Gastro-esophageal reflux disease without esophagitis: Secondary | ICD-10-CM | POA: Diagnosis not present

## 2016-11-22 DIAGNOSIS — Z85820 Personal history of malignant melanoma of skin: Secondary | ICD-10-CM | POA: Diagnosis not present

## 2016-11-22 DIAGNOSIS — E039 Hypothyroidism, unspecified: Secondary | ICD-10-CM | POA: Diagnosis not present

## 2016-11-22 DIAGNOSIS — F17210 Nicotine dependence, cigarettes, uncomplicated: Secondary | ICD-10-CM

## 2016-11-22 DIAGNOSIS — C799 Secondary malignant neoplasm of unspecified site: Secondary | ICD-10-CM

## 2016-11-22 DIAGNOSIS — C792 Secondary malignant neoplasm of skin: Secondary | ICD-10-CM | POA: Diagnosis not present

## 2016-11-22 DIAGNOSIS — K521 Toxic gastroenteritis and colitis: Secondary | ICD-10-CM

## 2016-11-22 DIAGNOSIS — C779 Secondary and unspecified malignant neoplasm of lymph node, unspecified: Secondary | ICD-10-CM

## 2016-11-22 DIAGNOSIS — C4371 Malignant melanoma of right lower limb, including hip: Secondary | ICD-10-CM

## 2016-11-22 LAB — CBC WITH DIFFERENTIAL/PLATELET
BASOS ABS: 0 10*3/uL (ref 0–0.1)
Basophils Relative: 0 %
EOS ABS: 0.2 10*3/uL (ref 0–0.7)
Eosinophils Relative: 2 %
HCT: 32 % — ABNORMAL LOW (ref 35.0–47.0)
HEMOGLOBIN: 11 g/dL — AB (ref 12.0–16.0)
LYMPHS ABS: 0.7 10*3/uL — AB (ref 1.0–3.6)
LYMPHS PCT: 8 %
MCH: 31.8 pg (ref 26.0–34.0)
MCHC: 34.2 g/dL (ref 32.0–36.0)
MCV: 92.8 fL (ref 80.0–100.0)
Monocytes Absolute: 0.3 10*3/uL (ref 0.2–0.9)
Monocytes Relative: 4 %
NEUTROS PCT: 86 %
Neutro Abs: 7.6 10*3/uL — ABNORMAL HIGH (ref 1.4–6.5)
Platelets: 400 10*3/uL (ref 150–440)
RBC: 3.45 MIL/uL — AB (ref 3.80–5.20)
RDW: 16.1 % — ABNORMAL HIGH (ref 11.5–14.5)
WBC: 8.9 10*3/uL (ref 3.6–11.0)

## 2016-11-22 LAB — COMPREHENSIVE METABOLIC PANEL
ALK PHOS: 95 U/L (ref 38–126)
ALT: 16 U/L (ref 14–54)
ANION GAP: 7 (ref 5–15)
AST: 29 U/L (ref 15–41)
Albumin: 3.2 g/dL — ABNORMAL LOW (ref 3.5–5.0)
BILIRUBIN TOTAL: 0.4 mg/dL (ref 0.3–1.2)
BUN: 16 mg/dL (ref 6–20)
CALCIUM: 8.1 mg/dL — AB (ref 8.9–10.3)
CO2: 24 mmol/L (ref 22–32)
CREATININE: 1.81 mg/dL — AB (ref 0.44–1.00)
Chloride: 106 mmol/L (ref 101–111)
GFR, EST AFRICAN AMERICAN: 35 mL/min — AB (ref >60)
GFR, EST NON AFRICAN AMERICAN: 31 mL/min — AB (ref >60)
Glucose, Bld: 121 mg/dL — ABNORMAL HIGH (ref 65–99)
Potassium: 4.1 mmol/L (ref 3.5–5.1)
SODIUM: 137 mmol/L (ref 135–145)
TOTAL PROTEIN: 5.6 g/dL — AB (ref 6.5–8.1)

## 2016-11-23 ENCOUNTER — Encounter: Payer: Self-pay | Admitting: Oncology

## 2016-11-23 LAB — SURGICAL PATHOLOGY

## 2016-11-24 NOTE — Anesthesia Postprocedure Evaluation (Signed)
Anesthesia Post Note  Patient: Tam Thurmond Butts  Procedure(s) Performed: Procedure(s) (LRB): ESOPHAGOGASTRODUODENOSCOPY (EGD) WITH PROPOFOL (N/A) COLONOSCOPY WITH PROPOFOL (N/A)  Patient location during evaluation: PACU Anesthesia Type: General Level of consciousness: awake and alert Pain management: pain level controlled Vital Signs Assessment: post-procedure vital signs reviewed and stable Respiratory status: spontaneous breathing, nonlabored ventilation, respiratory function stable and patient connected to nasal cannula oxygen Cardiovascular status: blood pressure returned to baseline and stable Postop Assessment: no apparent nausea or vomiting Anesthetic complications: no     Last Vitals:  Vitals:   11/21/16 1445 11/21/16 1455  BP: 137/73 112/66  Pulse:    Resp: 16   Temp:    SpO2:      Last Pain:  Vitals:   11/21/16 1425  TempSrc: Tympanic                 Molli Barrows

## 2016-11-25 DIAGNOSIS — K521 Toxic gastroenteritis and colitis: Secondary | ICD-10-CM | POA: Insufficient documentation

## 2016-11-25 MED ORDER — TRAMETINIB DIMETHYL SULFOXIDE 2 MG PO TABS: 2.0000 mg | ORAL_TABLET | Freq: Every day | ORAL | 5 refills | Status: DC

## 2016-11-28 ENCOUNTER — Other Ambulatory Visit: Payer: Self-pay | Admitting: Oncology

## 2016-11-28 NOTE — Progress Notes (Signed)
Patricia Li  Telephone:([phone] [phone]  ID: Patricia Li OB: April 12, 1960  MR#: [id number]  JFH#:[id number]  Patient Care Team: Patient, No Pcp Per as PCP - General (General Practice) Bary Castilla, Forest Gleason, MD as Consulting Physician (General Surgery) Forest Gleason, MD (Oncology) Lloyd Huger, MD as Consulting Physician (Oncology)  CHIEF COMPLAINT: Recurrent metastatic melanoma, BRAF+, of right lower extremity, metastatic.  INTERVAL HISTORY: Patient returns to clinic today for further evaluation and consideration Remicade for her persistent diarrhea. She is depressed and tearful secondary to her diarrhea. She has no neurological complaints. She denies any recent fevers or illnesses. She has a poor appetite. She denies any chest pain or shortness of breath. She has no nausea, vomiting, or constipation. She has no urinary complaints. Patient feels generally terrible, but offers no further specific complaints today  REVIEW OF SYSTEMS:   Review of Systems  Constitutional: Positive for malaise/fatigue and weight loss. Negative for fever.  HENT: Negative.  Negative for ear pain and tinnitus.   Eyes: Negative.   Respiratory: Negative for cough and shortness of breath.   Cardiovascular: Negative.  Negative for chest pain and leg swelling.  Gastrointestinal: Positive for diarrhea. Negative for abdominal pain, blood in stool, heartburn and melena.  Genitourinary: Negative.   Musculoskeletal: Positive for joint pain.  Skin: Negative for itching and rash.  Neurological: Positive for weakness. Negative for sensory change.  Endo/Heme/Allergies: Does not bruise/bleed easily.  Psychiatric/Behavioral: Positive for depression. The patient has insomnia. The patient is not nervous/anxious.     As per HPI. Otherwise, a complete review of systems is negative.  PAST MEDICAL HISTORY: Past Medical History:  Diagnosis Date  . Adrenal insufficiency ([place])   .  Bipolar disorder ([place])   . Cardiomyopathy ([place])   . CHF (congestive heart failure) ([place]) 2016   Dr. Nehemiah Massed  . Depression   . GERD (gastroesophageal reflux disease) 2012  . Hypertension   . Hypothyroidism   . Joint pain   . Lumbar herniated disc 2010  . Malignant melanoma of skin of lower limb, including hip ([place]) 2008, 2017   right thigh  . Osteoporosis, postmenopausal   . Overactive bladder   . Pancreatitis   . Panhypopituitarism ([place])   . Personal history of malignant melanoma of skin 2012  . Rectal bleeding   . Supraglottitis   . Tendonitis   . Tick bite of head 07/20/2015  . Weight loss, unintentional     PAST SURGICAL HISTORY: Past Surgical History:  Procedure Laterality Date  . BACK SURGERY  2010  . BREAST CYST EXCISION  1985  . COLONOSCOPY  07/2012   [facility]  . COLONOSCOPY WITH PROPOFOL N/A 11/21/2016   Procedure: COLONOSCOPY WITH PROPOFOL;  Surgeon: Manya Silvas, MD;  Location[facility] ENDOSCOPY;  Service: Endoscopy;  Laterality: N/A;  . ESOPHAGOGASTRODUODENOSCOPY    . ESOPHAGOGASTRODUODENOSCOPY (EGD) WITH PROPOFOL N/A 11/21/2016   Procedure: ESOPHAGOGASTRODUODENOSCOPY (EGD) WITH PROPOFOL;  Surgeon: Manya Silvas, MD;  Location: [facility] ENDOSCOPY;  Service: Endoscopy;  Laterality: N/A;  . FRACTURE SURGERY     rod removed from left femur 07/21/2014  . INGUINAL LYMPH NODE BIOPSY Right 07/17/2015   Procedure: INGUINAL LYMPH NODE BIOPSY/EXCISION;  Surgeon: Robert Bellow, MD;  Location: [facility] ORS;  Service: General;  Laterality: Right;  . laminnotomy reexploration posterior lumbar   12/26/2008   with nerve decomp and discectomy  . MELANOMA EXCISION  2008   right thigh  . PORTACATH PLACEMENT Left 03/14/2016   Procedure: INSERTION PORT-A-CATH;  Surgeon: Robert Bellow, MD;  Location: [facility] ORS;  Service: General;  Laterality: Left;  . SKIN LESION EXCISION Right 12-19-10   posterior  . SPINE SURGERY      FAMILY HISTORY: Family History  Problem Relation Age of  Onset  . COPD Father   . Cancer Sister        Colon, sister       ADVANCED DIRECTIVES:    HEALTH MAINTENANCE: Social History  Substance Use Topics  . Smoking status: Current Every Day Smoker    Packs/day: 0.50    Years: 20.00    Types: Cigarettes  . Smokeless tobacco: Never Used  . Alcohol use 0.0 oz/week     Comment: ocassionally     Colonoscopy:  PAP:  Bone density:  Lipid panel:  Allergies  Allergen Reactions  . Clindamycin Shortness Of Breath  . Levaquin [Levofloxacin] Other (See Comments)    Joint inflammation  . Aripiprazole Other (See Comments)    Facial and head ticks  . Depakote [Divalproex Sodium] Other (See Comments)  . Valproic Acid     Other reaction(s): Other (See Comments) Facial twitching    Current Outpatient Prescriptions  Medication Sig Dispense Refill  . calcium carbonate (OS-CAL) 600 MG TABS tablet Take 600 mg by mouth 2 (two) times daily with a meal.    . clonazePAM (KLONOPIN) 0.5 MG tablet Take 0.5 mg by mouth at bedtime.    . cyanocobalamin (,VITAMIN B-12,) 1000 MCG/ML injection INJECT 1ML INTO THE MUSCLE MONTHLY  6  . furosemide (LASIX) 20 MG tablet Take 20 mg by mouth as needed.     . gabapentin (NEURONTIN) 300 MG capsule Take 300 mg by mouth 3 (three) times daily.    Marland Kitchen HYDROcodone-acetaminophen (NORCO/VICODIN) 5-325 MG tablet Take 1 tablet by mouth every 4 (four) hours as needed for moderate pain. 60 tablet 0  . hydrocortisone (CORTEF) 10 MG tablet Take 10-15 mg by mouth 2 (two) times daily. 1.5 tablets (15 MG) in the morning and 1 tablet (10 MG) in the evening    . levothyroxine (SYNTHROID, LEVOTHROID) 112 MCG tablet Take 112 mcg by mouth daily before breakfast.     . Metoclopramide HCl (REGLAN PO) Take by mouth.    . metoprolol tartrate (LOPRESSOR) 25 MG tablet Take 12.5 mg by mouth 2 (two) times daily.     . Multiple Vitamin (MULTIVITAMIN WITH MINERALS) TABS tablet Take 1 tablet by mouth daily.    . pantoprazole (PROTONIX) 40 MG  tablet Take 40 mg by mouth 2 (two) times daily.     . predniSONE (DELTASONE) 10 MG tablet TAKE 1 TABLET (10 MG TOTAL) BY MOUTH DAILY WITH BREAKFAST. 30 tablet 0  . venlafaxine XR (EFFEXOR-XR) 150 MG 24 hr capsule 150 mg every morning.     . venlafaxine XR (EFFEXOR-XR) 75 MG 24 hr capsule Take 75 mg by mouth every evening.    Marland Kitchen aspirin-acetaminophen-caffeine (EXCEDRIN MIGRAINE) 250-250-65 MG tablet Take 2 tablets by mouth daily as needed for headache.    . lipase/protease/amylase (CREON) 12000 units CPEP capsule Take 12,000 Units by mouth 3 (three) times daily with meals.    . ondansetron (ZOFRAN) 4 MG tablet Take 4 mg by mouth every 8 (eight) hours as needed for nausea or vomiting.    . rifampin (RIFADIN) 300 MG capsule Take 300 mg by mouth daily.    . trametinib dimethyl sulfoxide (MEKINIST) 2 MG tablet Take 1 tablet (2 mg total) by mouth daily. Take 1 hour before or  2 hours after a meal. Store refrigerated in original container. (Patient not taking: Reported on 12/01/2016) 30 tablet 5   No current facility-administered medications for this visit.     OBJECTIVE: Vitals:   12/01/16 1033  BP: 134/83  Pulse: 69  Resp: 18  Temp: (!) 97.1 F (36.2 C)     Body mass index is 23.4 kg/m.    ECOG FS:0 - Asymptomatic  General: Well-developed, well-nourished, no acute distress. Eyes: Pink conjunctiva, anicteric sclera. Lungs: Clear to auscultation bilaterally. Heart: Regular rate and rhythm. No rubs, murmurs, or gallops. Abdomen: Soft, nontender, nondistended. No organomegaly noted, normoactive bowel sounds. Musculoskeletal: No edema, cyanosis, or clubbing. Mild swelling of joints, no decrease in strength. Mild tenderness to touch. Neuro: Alert, answering all questions appropriately. Cranial nerves grossly intact. Skin: No rash or petechiae noted. Psych: Normal affect.   LAB RESULTS:  Lab Results  Component Value Date   NA 137 12/01/2016   K 3.5 12/01/2016   CL 105 12/01/2016   CO2 26  12/01/2016   GLUCOSE 69 12/01/2016   BUN 13 12/01/2016   CREATININE 1.90 (H) 12/01/2016   CALCIUM 8.1 (L) 12/01/2016   PROT 5.5 (L) 12/01/2016   ALBUMIN 3.2 (L) 12/01/2016   AST 42 (H) 12/01/2016   ALT 22 12/01/2016   ALKPHOS 130 (H) 12/01/2016   BILITOT 0.4 12/01/2016   GFRNONAA 29 (L) 12/01/2016   GFRAA 33 (L) 12/01/2016    Lab Results  Component Value Date   WBC 5.2 12/01/2016   NEUTROABS 3.2 12/01/2016   HGB 11.1 (L) 12/01/2016   HCT 32.0 (L) 12/01/2016   MCV 92.5 12/01/2016   PLT 287 12/01/2016    STUDIES: No results found.  ONCOLOGIC HISTORY: Patient initially diagnosed with metastatic melanoma to right thigh with unknown primary in 2009. She received 4 weeks of high-dose interferon therapy in February 2009 and proceed with low-dose interferon therapy in March 2009. Patient remained in remission until August 2012 when she was noted to have recurrent melanoma and a pelvic lymph node. October 2012 she had 2 cycles of Ipilumimab which was discontinued secondary to poor tolerance. In April 2017 PET scan showed recurrent disease in the right inguinal region which was confirmed by biopsy. Zeboraf discontinued in November 2017 secondary to intolerable rash. Patient initiated combination immunotherapy with nivolumab and ipilumimab on January 14, 2016. Patient initiated maintenance nivolumab on April 14, 2016 and discontinued on November 07, 2016.  ASSESSMENT: Recurrent metastatic melanoma, BRAF+, of right lower extremity, metastatic.  PLAN:    1. Recurrent metastatic melanoma, BRAF+, of right lower extremity, metastatic:  PET scan results from October 16, 2016 reviewed independently with continued improvement of disease burden. She completed palliative XRT to her iliac crest lesion on August 11, 2016. Because of patient's persistent grade 3 diarrhea will not pursue any additional treatments with nivolumab. Proceed with Remicade today. Awaiting insurance approval for 2 mg trametinib  (Mekinist) daily. If patient tolerates this well, can attempt combination therapy using dabrafenib/trametinib in the future. Single agent Cobimetinib is also an option. Return to clinic next week for further evaluation.   2. Pain: Patient states she has discontinued Plaquenil.  She continues to take hydrocodone intermittently. 3. Nausea: Continue Compazine as needed. 4. Diarrhea: At least grade 3 and refractory to steroids. Colonoscopy and EGD were unrevealing. Proceed with Remicade as above. Appreciate GI input. 5. Pancreatitis: Unclear etiology. Continue follow-up with GI as indicated.  Approximately 30 minutes was spent in discussion of which greater than 50%  was consultation.  Patient expressed understanding and was in agreement with this plan. She also understands that She can call clinic at any time with any questions, concerns, or complaints.    Lloyd Huger, MD 12/02/16 10:40 AM

## 2016-11-29 ENCOUNTER — Other Ambulatory Visit: Payer: Self-pay | Admitting: *Deleted

## 2016-11-29 ENCOUNTER — Telehealth: Payer: Self-pay | Admitting: Pharmacist

## 2016-11-29 DIAGNOSIS — C779 Secondary and unspecified malignant neoplasm of lymph node, unspecified: Secondary | ICD-10-CM

## 2016-11-29 DIAGNOSIS — Z79899 Other long term (current) drug therapy: Secondary | ICD-10-CM

## 2016-11-29 DIAGNOSIS — C439 Malignant melanoma of skin, unspecified: Secondary | ICD-10-CM

## 2016-11-29 MED ORDER — TRAMETINIB DIMETHYL SULFOXIDE 2 MG PO TABS: 2.0000 mg | ORAL_TABLET | Freq: Every day | ORAL | 5 refills | Status: DC

## 2016-11-29 NOTE — Telephone Encounter (Signed)
Oral Oncology Pharmacist Encounter  Received new prescription for Mekinist (trametinib) for the treatment of metastatic melanoma, planned duration until disease progression or unacceptable drug toxicity. The plan is for Ms. Cua to start on Mekinist first and if that is well tolerated she will then start on combination Mekinist/Tafinlar (dabrafenib/trametinib).  BP from 11/22/16 assessed, BP wnl. Patients last ECHO from 01/28/2016 ([place]) showed EF of 45%. Patient has chronic systolic CHF list as a problem of her problem list. The risk of cardiomyopathy with Mekinist is 3-9%. Patient's were excluded from the COMBI-MB trial if the had an LVEF <LLN. Spoke with Dr. Grayland Ormond about the patient's cardiac status, the patient is currently out of treatment options. She will have a more current ECHO done before she starts on therapy and be actively monitored during therapy. She should have an ECHO at baseline, after one month, then every 2-3 months thereafter. Ms. Vary should also be monitored for symptoms of CHF.  Prescription dose and frequency assessed.   Current medication list in Epic reviewed, no DDIs with Mekinist identified.  Prescription has been e-scribed to the [facility] for benefits analysis and approval.  Oral Oncology Clinic will continue to follow for insurance authorization, copayment issues, initial counseling and start date.  Darl Pikes, PharmD, BCPS Hematology/Oncology Clinical Pharmacist [facility] [place] [phone]  11/29/2016 2:41 PM

## 2016-11-30 ENCOUNTER — Other Ambulatory Visit: Payer: Self-pay | Admitting: *Deleted

## 2016-11-30 DIAGNOSIS — C439 Malignant melanoma of skin, unspecified: Secondary | ICD-10-CM

## 2016-11-30 NOTE — Telephone Encounter (Signed)
Oral Oncology Patient Advocate Encounter  Received notification from Medicare that prior authorization for Trametinib is required.  PA submitted on CoverMyMeds Key [id number] Status is pending  Oral Oncology Clinic will continue to follow.   [place] Patient Advocate [phone] 11/30/2016 9:25 AM

## 2016-12-01 ENCOUNTER — Inpatient Hospital Stay (HOSPITAL_BASED_OUTPATIENT_CLINIC_OR_DEPARTMENT_OTHER): Payer: Medicare Other | Admitting: Oncology

## 2016-12-01 ENCOUNTER — Inpatient Hospital Stay: Payer: Medicare Other | Attending: Oncology

## 2016-12-01 ENCOUNTER — Inpatient Hospital Stay: Payer: Medicare Other

## 2016-12-01 VITALS — BP 134/83 | HR 69 | Temp 97.1°F | Resp 18 | Wt 149.4 lb

## 2016-12-01 DIAGNOSIS — Z800 Family history of malignant neoplasm of digestive organs: Secondary | ICD-10-CM

## 2016-12-01 DIAGNOSIS — R197 Diarrhea, unspecified: Secondary | ICD-10-CM

## 2016-12-01 DIAGNOSIS — C439 Malignant melanoma of skin, unspecified: Secondary | ICD-10-CM

## 2016-12-01 DIAGNOSIS — R5383 Other fatigue: Secondary | ICD-10-CM | POA: Diagnosis not present

## 2016-12-01 DIAGNOSIS — R531 Weakness: Secondary | ICD-10-CM | POA: Insufficient documentation

## 2016-12-01 DIAGNOSIS — N3281 Overactive bladder: Secondary | ICD-10-CM

## 2016-12-01 DIAGNOSIS — K859 Acute pancreatitis without necrosis or infection, unspecified: Secondary | ICD-10-CM | POA: Insufficient documentation

## 2016-12-01 DIAGNOSIS — E039 Hypothyroidism, unspecified: Secondary | ICD-10-CM

## 2016-12-01 DIAGNOSIS — Z5112 Encounter for antineoplastic immunotherapy: Secondary | ICD-10-CM | POA: Diagnosis not present

## 2016-12-01 DIAGNOSIS — R110 Nausea: Secondary | ICD-10-CM | POA: Diagnosis not present

## 2016-12-01 DIAGNOSIS — F17210 Nicotine dependence, cigarettes, uncomplicated: Secondary | ICD-10-CM | POA: Insufficient documentation

## 2016-12-01 DIAGNOSIS — K521 Toxic gastroenteritis and colitis: Secondary | ICD-10-CM

## 2016-12-01 DIAGNOSIS — R634 Abnormal weight loss: Secondary | ICD-10-CM | POA: Diagnosis not present

## 2016-12-01 DIAGNOSIS — F329 Major depressive disorder, single episode, unspecified: Secondary | ICD-10-CM | POA: Insufficient documentation

## 2016-12-01 DIAGNOSIS — I509 Heart failure, unspecified: Secondary | ICD-10-CM | POA: Diagnosis not present

## 2016-12-01 DIAGNOSIS — F319 Bipolar disorder, unspecified: Secondary | ICD-10-CM

## 2016-12-01 DIAGNOSIS — Z79899 Other long term (current) drug therapy: Secondary | ICD-10-CM | POA: Insufficient documentation

## 2016-12-01 DIAGNOSIS — I429 Cardiomyopathy, unspecified: Secondary | ICD-10-CM

## 2016-12-01 DIAGNOSIS — I10 Essential (primary) hypertension: Secondary | ICD-10-CM

## 2016-12-01 DIAGNOSIS — E230 Hypopituitarism: Secondary | ICD-10-CM | POA: Diagnosis not present

## 2016-12-01 DIAGNOSIS — K219 Gastro-esophageal reflux disease without esophagitis: Secondary | ICD-10-CM | POA: Diagnosis not present

## 2016-12-01 DIAGNOSIS — M2550 Pain in unspecified joint: Secondary | ICD-10-CM

## 2016-12-01 DIAGNOSIS — G4700 Insomnia, unspecified: Secondary | ICD-10-CM | POA: Insufficient documentation

## 2016-12-01 DIAGNOSIS — C779 Secondary and unspecified malignant neoplasm of lymph node, unspecified: Secondary | ICD-10-CM

## 2016-12-01 DIAGNOSIS — R944 Abnormal results of kidney function studies: Secondary | ICD-10-CM | POA: Diagnosis not present

## 2016-12-01 DIAGNOSIS — M810 Age-related osteoporosis without current pathological fracture: Secondary | ICD-10-CM

## 2016-12-01 DIAGNOSIS — C4371 Malignant melanoma of right lower limb, including hip: Secondary | ICD-10-CM | POA: Insufficient documentation

## 2016-12-01 LAB — COMPREHENSIVE METABOLIC PANEL
ALT: 22 U/L (ref 14–54)
AST: 42 U/L — AB (ref 15–41)
Albumin: 3.2 g/dL — ABNORMAL LOW (ref 3.5–5.0)
Alkaline Phosphatase: 130 U/L — ABNORMAL HIGH (ref 38–126)
Anion gap: 6 (ref 5–15)
BILIRUBIN TOTAL: 0.4 mg/dL (ref 0.3–1.2)
BUN: 13 mg/dL (ref 6–20)
CALCIUM: 8.1 mg/dL — AB (ref 8.9–10.3)
CO2: 26 mmol/L (ref 22–32)
CREATININE: 1.9 mg/dL — AB (ref 0.44–1.00)
Chloride: 105 mmol/L (ref 101–111)
GFR, EST AFRICAN AMERICAN: 33 mL/min — AB (ref >60)
GFR, EST NON AFRICAN AMERICAN: 29 mL/min — AB (ref >60)
Glucose, Bld: 69 mg/dL (ref 65–99)
Potassium: 3.5 mmol/L (ref 3.5–5.1)
Sodium: 137 mmol/L (ref 135–145)
TOTAL PROTEIN: 5.5 g/dL — AB (ref 6.5–8.1)

## 2016-12-01 LAB — CBC WITH DIFFERENTIAL/PLATELET
BASOS ABS: 0.1 10*3/uL (ref 0–0.1)
Basophils Relative: 2 %
Eosinophils Absolute: 0.2 10*3/uL (ref 0–0.7)
Eosinophils Relative: 5 %
HEMATOCRIT: 32 % — AB (ref 35.0–47.0)
Hemoglobin: 11.1 g/dL — ABNORMAL LOW (ref 12.0–16.0)
LYMPHS ABS: 1.3 10*3/uL (ref 1.0–3.6)
LYMPHS PCT: 25 %
MCH: 32.2 pg (ref 26.0–34.0)
MCHC: 34.8 g/dL (ref 32.0–36.0)
MCV: 92.5 fL (ref 80.0–100.0)
MONO ABS: 0.3 10*3/uL (ref 0.2–0.9)
Monocytes Relative: 7 %
NEUTROS ABS: 3.2 10*3/uL (ref 1.4–6.5)
Neutrophils Relative %: 61 %
Platelets: 287 10*3/uL (ref 150–440)
RBC: 3.46 MIL/uL — AB (ref 3.80–5.20)
RDW: 15.5 % — ABNORMAL HIGH (ref 11.5–14.5)
WBC: 5.2 10*3/uL (ref 3.6–11.0)

## 2016-12-01 LAB — MAGNESIUM: MAGNESIUM: 2.3 mg/dL (ref 1.7–2.4)

## 2016-12-01 MED ORDER — INFLIXIMAB 100 MG IV SOLR
400.0000 mg | Freq: Once | INTRAVENOUS | Status: AC
  Administered 2016-12-01: 400 mg via INTRAVENOUS
  Filled 2016-12-01: qty 40

## 2016-12-01 MED ORDER — SODIUM CHLORIDE 0.9% FLUSH
10.0000 mL | Freq: Once | INTRAVENOUS | Status: AC
  Administered 2016-12-01: 10 mL via INTRAVENOUS
  Filled 2016-12-01: qty 10

## 2016-12-01 MED ORDER — ACETAMINOPHEN 325 MG PO TABS
650.0000 mg | ORAL_TABLET | Freq: Once | ORAL | Status: AC
  Administered 2016-12-01: 650 mg via ORAL
  Filled 2016-12-01: qty 2

## 2016-12-01 MED ORDER — HEPARIN SOD (PORK) LOCK FLUSH 100 UNIT/ML IV SOLN
500.0000 [IU] | Freq: Once | INTRAVENOUS | Status: AC
  Administered 2016-12-01: 500 [IU] via INTRAVENOUS
  Filled 2016-12-01: qty 5

## 2016-12-01 MED ORDER — SODIUM CHLORIDE 0.9 % IV SOLN
Freq: Once | INTRAVENOUS | Status: AC
  Administered 2016-12-01: 12:00:00 via INTRAVENOUS
  Filled 2016-12-01: qty 1000

## 2016-12-01 NOTE — Progress Notes (Signed)
Patient here today for follow up and remicade infusion, patient reports continued diarrhea.

## 2016-12-02 ENCOUNTER — Ambulatory Visit
Admission: RE | Admit: 2016-12-02 | Discharge: 2016-12-02 | Disposition: A | Discharge status: Home / Self Care | Payer: Medicare Other | Source: Ambulatory Visit | Attending: Oncology | Admitting: Oncology

## 2016-12-02 DIAGNOSIS — E039 Hypothyroidism, unspecified: Secondary | ICD-10-CM | POA: Insufficient documentation

## 2016-12-02 DIAGNOSIS — F319 Bipolar disorder, unspecified: Secondary | ICD-10-CM | POA: Diagnosis not present

## 2016-12-02 DIAGNOSIS — I110 Hypertensive heart disease with heart failure: Secondary | ICD-10-CM | POA: Insufficient documentation

## 2016-12-02 DIAGNOSIS — Z79899 Other long term (current) drug therapy: Secondary | ICD-10-CM | POA: Diagnosis not present

## 2016-12-02 DIAGNOSIS — I071 Rheumatic tricuspid insufficiency: Secondary | ICD-10-CM | POA: Diagnosis not present

## 2016-12-02 DIAGNOSIS — K219 Gastro-esophageal reflux disease without esophagitis: Secondary | ICD-10-CM | POA: Diagnosis not present

## 2016-12-02 DIAGNOSIS — I429 Cardiomyopathy, unspecified: Secondary | ICD-10-CM | POA: Insufficient documentation

## 2016-12-02 DIAGNOSIS — E2740 Unspecified adrenocortical insufficiency: Secondary | ICD-10-CM | POA: Insufficient documentation

## 2016-12-02 DIAGNOSIS — I509 Heart failure, unspecified: Secondary | ICD-10-CM | POA: Insufficient documentation

## 2016-12-02 LAB — HEPATITIS B SURFACE ANTIBODY, QUANTITATIVE: Hepatitis B-Post: 216.9 m[IU]/mL (ref >9.9)

## 2016-12-02 LAB — HEPATITIS B SURFACE ANTIGEN: HEP B S AG: NEGATIVE

## 2016-12-02 NOTE — Progress Notes (Signed)
*  PRELIMINARY RESULTS* Echocardiogram 2D Echocardiogram has been performed.  Patricia Li 12/02/2016, 12:23 PM

## 2016-12-02 NOTE — Telephone Encounter (Signed)
.  Oral Oncology Patient Advocate Encounter  Prior Authorization for Trametinib has been approved.    PA# [id number] Effective dates: 12/02/2016 through 02/27/2018  Patients co-pay is $3.70.   Emailed [facility] to fill Trametinib and sent patients credit card information to them. Mail to home address.  Oral Oncology Clinic will continue to follow.    [place] Patient Advocate [phone] 12/02/2016 4:41 PM

## 2016-12-02 NOTE — Telephone Encounter (Signed)
Oral Chemotherapy Pharmacist Encounter  I spoke with patient for overview of new oral chemotherapy medication: Mekinist (trametinib) for the treatment of metastatic melanoma, planned duration until disease progression or unacceptable drug toxicity. The plan is for Patricia Li to start on Mekinist first and if that is well tolerated she will then start on combination Mekinist/Tafinlar (dabrafenib/trametinib).   Patient had ECHO completed on 12/02/16, EF 55%- 60%   Pt is doing well. Counseled patient on administration, dosing, side effects, monitoring, drug-food interactions, safe handling, storage, and disposal. Patient will take 1 tablet (2 mg total) by mouth daily. Take 1 hour before or 2 hours after a meal. Store refrigerated in original container.  Side effects include but not limited to: diarrhea, rash.    Reviewed with patient importance of keeping a medication schedule and plan for any missed doses.  Patricia Li voiced understanding and appreciation.   All questions answered.  Provided patient with [facility] phone number. Patient knows to call the office with questions or concerns. Oral Chemotherapy Navigation Clinic will continue to follow.  Thank you,  Darl Pikes, PharmD, BCPS Hematology/Oncology Clinical Pharmacist [facility] [place] [facility] [phone]  12/02/2016 3:45 PM

## 2016-12-03 ENCOUNTER — Encounter: Payer: Self-pay | Admitting: Oncology

## 2016-12-05 ENCOUNTER — Encounter: Payer: Self-pay | Admitting: Oncology

## 2016-12-05 DIAGNOSIS — K8681 Exocrine pancreatic insufficiency: Secondary | ICD-10-CM | POA: Diagnosis not present

## 2016-12-05 DIAGNOSIS — T50905A Adverse effect of unspecified drugs, medicaments and biological substances, initial encounter: Secondary | ICD-10-CM | POA: Diagnosis not present

## 2016-12-05 DIAGNOSIS — C439 Malignant melanoma of skin, unspecified: Secondary | ICD-10-CM | POA: Diagnosis not present

## 2016-12-05 DIAGNOSIS — E2749 Other adrenocortical insufficiency: Secondary | ICD-10-CM | POA: Diagnosis not present

## 2016-12-05 DIAGNOSIS — R110 Nausea: Secondary | ICD-10-CM | POA: Diagnosis not present

## 2016-12-05 DIAGNOSIS — E230 Hypopituitarism: Secondary | ICD-10-CM | POA: Diagnosis not present

## 2016-12-05 DIAGNOSIS — C779 Secondary and unspecified malignant neoplasm of lymph node, unspecified: Secondary | ICD-10-CM | POA: Diagnosis not present

## 2016-12-05 DIAGNOSIS — K861 Other chronic pancreatitis: Secondary | ICD-10-CM | POA: Diagnosis not present

## 2016-12-05 DIAGNOSIS — R634 Abnormal weight loss: Secondary | ICD-10-CM | POA: Diagnosis not present

## 2016-12-05 DIAGNOSIS — R197 Diarrhea, unspecified: Secondary | ICD-10-CM | POA: Diagnosis not present

## 2016-12-05 DIAGNOSIS — E038 Other specified hypothyroidism: Secondary | ICD-10-CM | POA: Diagnosis not present

## 2016-12-05 MED FILL — MEKINIST 2 MG TAB: 2 | 30 days supply | Qty: 30 | Fill #0

## 2016-12-05 NOTE — Progress Notes (Signed)
Nutrition  Message received that patient wanted to schedule a nutrition appointment to discuss diet for pancreatitis.  I called patient back and left message that I can see her on 10/11 after MD appointment.  Evaristo Tsuda B. Zenia Resides, [place], [place] Registered Dietitian [phone] (pager)

## 2016-12-06 NOTE — Progress Notes (Deleted)
[place]  Telephone:([phone] [phone]  ID: Patricia Li OB: April 25, 1960  MR#: [id number]  AJG#:[id number]  Patient Care Team: Patient, No Pcp Per as PCP - General (General Practice) Bary Castilla, Forest Gleason, MD as Consulting Physician (General Surgery) Forest Gleason, MD (Oncology) Lloyd Huger, MD as Consulting Physician (Oncology)  CHIEF COMPLAINT: Recurrent metastatic melanoma, BRAF+, of right lower extremity, metastatic.  INTERVAL HISTORY: Patient returns to clinic today for further evaluation and consideration Remicade for her persistent diarrhea. She is depressed and tearful secondary to her diarrhea. She has no neurological complaints. She denies any recent fevers or illnesses. She has a poor appetite. She denies any chest pain or shortness of breath. She has no nausea, vomiting, or constipation. She has no urinary complaints. Patient feels generally terrible, but offers no further specific complaints today  REVIEW OF SYSTEMS:   Review of Systems  Constitutional: Positive for malaise/fatigue and weight loss. Negative for fever.  HENT: Negative.  Negative for ear pain and tinnitus.   Eyes: Negative.   Respiratory: Negative for cough and shortness of breath.   Cardiovascular: Negative.  Negative for chest pain and leg swelling.  Gastrointestinal: Positive for diarrhea. Negative for abdominal pain, blood in stool, heartburn and melena.  Genitourinary: Negative.   Musculoskeletal: Positive for joint pain.  Skin: Negative for itching and rash.  Neurological: Positive for weakness. Negative for sensory change.  Endo/Heme/Allergies: Does not bruise/bleed easily.  Psychiatric/Behavioral: Positive for depression. The patient has insomnia. The patient is not nervous/anxious.     As per HPI. Otherwise, a complete review of systems is negative.  PAST MEDICAL HISTORY: Past Medical History:  Diagnosis Date  . Adrenal insufficiency ([place])   .  Bipolar disorder ([place])   . Cardiomyopathy ([place])   . CHF (congestive heart failure) ([place]) 2016   Dr. Nehemiah Massed  . Depression   . GERD (gastroesophageal reflux disease) 2012  . Hypertension   . Hypothyroidism   . Joint pain   . Lumbar herniated disc 2010  . Malignant melanoma of skin of lower limb, including hip ([place]) 2008, 2017   right thigh  . Osteoporosis, postmenopausal   . Overactive bladder   . Pancreatitis   . Panhypopituitarism ([place])   . Personal history of malignant melanoma of skin 2012  . Rectal bleeding   . Supraglottitis   . Tendonitis   . Tick bite of head 07/20/2015  . Weight loss, unintentional     PAST SURGICAL HISTORY: Past Surgical History:  Procedure Laterality Date  . BACK SURGERY  2010  . BREAST CYST EXCISION  1985  . COLONOSCOPY  07/2012   [facility]  . COLONOSCOPY WITH PROPOFOL N/A 11/21/2016   Procedure: COLONOSCOPY WITH PROPOFOL;  Surgeon: Manya Silvas, MD;  Location: [facility] ENDOSCOPY;  Service: Endoscopy;  Laterality: N/A;  . ESOPHAGOGASTRODUODENOSCOPY    . ESOPHAGOGASTRODUODENOSCOPY (EGD) WITH PROPOFOL N/A 11/21/2016   Procedure: ESOPHAGOGASTRODUODENOSCOPY (EGD) WITH PROPOFOL;  Surgeon: Manya Silvas, MD;  Location: [facility] ENDOSCOPY;  Service: Endoscopy;  Laterality: N/A;  . FRACTURE SURGERY     rod removed from left femur 07/21/2014  . INGUINAL LYMPH NODE BIOPSY Right 07/17/2015   Procedure: INGUINAL LYMPH NODE BIOPSY/EXCISION;  Surgeon: Robert Bellow, MD;  Location: [facility] ORS;  Service: General;  Laterality: Right;  . laminnotomy reexploration posterior lumbar   12/26/2008   with nerve decomp and discectomy  . MELANOMA EXCISION  2008   right thigh  . PORTACATH PLACEMENT Left 03/14/2016   Procedure: INSERTION PORT-A-CATH;  Surgeon: Robert Bellow, MD;  Location: [facility] ORS;  Service: General;  Laterality: Left;  . SKIN LESION EXCISION Right 12-19-10   posterior  . SPINE SURGERY      FAMILY HISTORY: Family History  Problem Relation Age of  Onset  . COPD Father   . Cancer Sister        Colon, sister       ADVANCED DIRECTIVES:    HEALTH MAINTENANCE: Social History  Substance Use Topics  . Smoking status: Current Every Day Smoker    Packs/day: 0.50    Years: 20.00    Types: Cigarettes  . Smokeless tobacco: Never Used  . Alcohol use 0.0 oz/week     Comment: ocassionally     Colonoscopy:  PAP:  Bone density:  Lipid panel:  Allergies  Allergen Reactions  . Clindamycin Shortness Of Breath  . Levaquin [Levofloxacin] Other (See Comments)    Joint inflammation  . Aripiprazole Other (See Comments)    Facial and head ticks  . Depakote [Divalproex Sodium] Other (See Comments)  . Valproic Acid     Other reaction(s): Other (See Comments) Facial twitching    Current Outpatient Prescriptions  Medication Sig Dispense Refill  . aspirin-acetaminophen-caffeine (EXCEDRIN MIGRAINE) 250-250-65 MG tablet Take 2 tablets by mouth daily as needed for headache.    . calcium carbonate (OS-CAL) 600 MG TABS tablet Take 600 mg by mouth 2 (two) times daily with a meal.    . clonazePAM (KLONOPIN) 0.5 MG tablet Take 0.5 mg by mouth at bedtime.    . cyanocobalamin (,VITAMIN B-12,) 1000 MCG/ML injection INJECT 1ML INTO THE MUSCLE MONTHLY  6  . furosemide (LASIX) 20 MG tablet Take 20 mg by mouth as needed.     . gabapentin (NEURONTIN) 300 MG capsule Take 300 mg by mouth 3 (three) times daily.    Marland Kitchen HYDROcodone-acetaminophen (NORCO/VICODIN) 5-325 MG tablet Take 1 tablet by mouth every 4 (four) hours as needed for moderate pain. 60 tablet 0  . hydrocortisone (CORTEF) 10 MG tablet Take 10-15 mg by mouth 2 (two) times daily. 1.5 tablets (15 MG) in the morning and 1 tablet (10 MG) in the evening    . levothyroxine (SYNTHROID, LEVOTHROID) 112 MCG tablet Take 112 mcg by mouth daily before breakfast.     . lipase/protease/amylase (CREON) 12000 units CPEP capsule Take 12,000 Units by mouth 3 (three) times daily with meals.    . Metoclopramide HCl  (REGLAN PO) Take by mouth.    . metoprolol tartrate (LOPRESSOR) 25 MG tablet Take 12.5 mg by mouth 2 (two) times daily.     . Multiple Vitamin (MULTIVITAMIN WITH MINERALS) TABS tablet Take 1 tablet by mouth daily.    . ondansetron (ZOFRAN) 4 MG tablet Take 4 mg by mouth every 8 (eight) hours as needed for nausea or vomiting.    . pantoprazole (PROTONIX) 40 MG tablet Take 40 mg by mouth 2 (two) times daily.     . predniSONE (DELTASONE) 10 MG tablet TAKE 1 TABLET (10 MG TOTAL) BY MOUTH DAILY WITH BREAKFAST. 30 tablet 0  . rifampin (RIFADIN) 300 MG capsule Take 300 mg by mouth daily.    . trametinib dimethyl sulfoxide (MEKINIST) 2 MG tablet Take 1 tablet (2 mg total) by mouth daily. Take 1 hour before or 2 hours after a meal. Store refrigerated in original container. (Patient not taking: Reported on 12/01/2016) 30 tablet 5  . venlafaxine XR (EFFEXOR-XR) 150 MG 24 hr capsule 150 mg every morning.     Marland Kitchen  venlafaxine XR (EFFEXOR-XR) 75 MG 24 hr capsule Take 75 mg by mouth every evening.     No current facility-administered medications for this visit.     OBJECTIVE: There were no vitals filed for this visit.   There is no height or weight on file to calculate BMI.    ECOG FS:0 - Asymptomatic  General: Well-developed, well-nourished, no acute distress. Eyes: Pink conjunctiva, anicteric sclera. Lungs: Clear to auscultation bilaterally. Heart: Regular rate and rhythm. No rubs, murmurs, or gallops. Abdomen: Soft, nontender, nondistended. No organomegaly noted, normoactive bowel sounds. Musculoskeletal: No edema, cyanosis, or clubbing. Mild swelling of joints, no decrease in strength. Mild tenderness to touch. Neuro: Alert, answering all questions appropriately. Cranial nerves grossly intact. Skin: No rash or petechiae noted. Psych: Normal affect.   LAB RESULTS:  Lab Results  Component Value Date   NA 137 12/01/2016   K 3.5 12/01/2016   CL 105 12/01/2016   CO2 26 12/01/2016   GLUCOSE 69  12/01/2016   BUN 13 12/01/2016   CREATININE 1.90 (H) 12/01/2016   CALCIUM 8.1 (L) 12/01/2016   PROT 5.5 (L) 12/01/2016   ALBUMIN 3.2 (L) 12/01/2016   AST 42 (H) 12/01/2016   ALT 22 12/01/2016   ALKPHOS 130 (H) 12/01/2016   BILITOT 0.4 12/01/2016   GFRNONAA 29 (L) 12/01/2016   GFRAA 33 (L) 12/01/2016    Lab Results  Component Value Date   WBC 5.2 12/01/2016   NEUTROABS 3.2 12/01/2016   HGB 11.1 (L) 12/01/2016   HCT 32.0 (L) 12/01/2016   MCV 92.5 12/01/2016   PLT 287 12/01/2016    STUDIES: No results found.  ONCOLOGIC HISTORY: Patient initially diagnosed with metastatic melanoma to right thigh with unknown primary in 2009. She received 4 weeks of high-dose interferon therapy in February 2009 and proceed with low-dose interferon therapy in March 2009. Patient remained in remission until August 2012 when she was noted to have recurrent melanoma and a pelvic lymph node. October 2012 she had 2 cycles of Ipilumimab which was discontinued secondary to poor tolerance. In April 2017 PET scan showed recurrent disease in the right inguinal region which was confirmed by biopsy. Zeboraf discontinued in November 2017 secondary to intolerable rash. Patient initiated combination immunotherapy with nivolumab and ipilumimab on January 14, 2016. Patient initiated maintenance nivolumab on April 14, 2016 and discontinued on November 07, 2016.  ASSESSMENT: Recurrent metastatic melanoma, BRAF+, of right lower extremity, metastatic.  PLAN:    1. Recurrent metastatic melanoma, BRAF+, of right lower extremity, metastatic:  PET scan results from October 16, 2016 reviewed independently with continued improvement of disease burden. She completed palliative XRT to her iliac crest lesion on August 11, 2016. Because of patient's persistent grade 3 diarrhea will not pursue any additional treatments with nivolumab. Proceed with Remicade today. Awaiting insurance approval for 2 mg trametinib (Mekinist) daily. If  patient tolerates this well, can attempt combination therapy using dabrafenib/trametinib in the future. Single agent Cobimetinib is also an option. Return to clinic next week for further evaluation.   2. Pain: Patient states she has discontinued Plaquenil.  She continues to take hydrocodone intermittently. 3. Nausea: Continue Compazine as needed. 4. Diarrhea: At least grade 3 and refractory to steroids. Colonoscopy and EGD were unrevealing. Proceed with Remicade as above. Appreciate GI input. 5. Pancreatitis: Unclear etiology. Continue follow-up with GI as indicated.  Approximately 30 minutes was spent in discussion of which greater than 50% was consultation.  Patient expressed understanding and was in agreement with this  plan. She also understands that She can call clinic at any time with any questions, concerns, or complaints.    Lloyd Huger, MD 12/06/16 8:04 AM

## 2016-12-08 ENCOUNTER — Inpatient Hospital Stay: Payer: Medicare Other

## 2016-12-08 ENCOUNTER — Inpatient Hospital Stay: Payer: Medicare Other | Admitting: Oncology

## 2016-12-09 ENCOUNTER — Other Ambulatory Visit: Payer: Self-pay | Admitting: Oncology

## 2016-12-09 DIAGNOSIS — C439 Malignant melanoma of skin, unspecified: Secondary | ICD-10-CM

## 2016-12-11 ENCOUNTER — Other Ambulatory Visit: Payer: Self-pay | Admitting: Oncology

## 2016-12-11 NOTE — Progress Notes (Signed)
[place]  Telephone:([phone] [phone]  ID: Patricia Li OB: 1960-11-23  MR#: [id number]  OEU#:[id number]  Patient Care Team: Patient, No Pcp Per as PCP - General (General Practice) Bary Castilla, Forest Gleason, MD as Consulting Physician (General Surgery) Forest Gleason, MD (Oncology) Lloyd Huger, MD as Consulting Physician (Oncology)  CHIEF COMPLAINT: Recurrent metastatic melanoma, BRAF+, of right lower extremity, metastatic.  INTERVAL HISTORY: Patient returns to clinic today for further evaluation and to assess her toleration of Mekinist. She received Remicade 2 weeks ago with resolution of her diarrhea and her joint pain. Patient states she feels well and is back to her baseline. She has no neurological complaints. She denies any recent fevers or illnesses. She has a good appetite. She denies any chest pain or shortness of breath. She has no nausea, vomiting, or constipation. She has no urinary complaints. Patient no specific complaints today  REVIEW OF SYSTEMS:   Review of Systems  Constitutional: Negative for fever, malaise/fatigue and weight loss.  HENT: Negative.  Negative for ear pain and tinnitus.   Eyes: Negative.   Respiratory: Negative for cough and shortness of breath.   Cardiovascular: Negative.  Negative for chest pain and leg swelling.  Gastrointestinal: Negative for abdominal pain, blood in stool, diarrhea, heartburn and melena.  Genitourinary: Negative.   Musculoskeletal: Positive for back pain. Negative for joint pain.  Skin: Negative for itching and rash.  Neurological: Negative for sensory change and weakness.  Endo/Heme/Allergies: Does not bruise/bleed easily.  Psychiatric/Behavioral: Negative.  Negative for depression. The patient is not nervous/anxious and does not have insomnia.     As per HPI. Otherwise, a complete review of systems is negative.  PAST MEDICAL HISTORY: Past Medical History:  Diagnosis Date  . Adrenal  insufficiency ([place])   . Bipolar disorder ([place])   . Cardiomyopathy ([place])   . CHF (congestive heart failure) ([place]) 2016   Dr. Nehemiah Massed  . Depression   . GERD (gastroesophageal reflux disease) 2012  . Hypertension   . Hypothyroidism   . Joint pain   . Lumbar herniated disc 2010  . Malignant melanoma of skin of lower limb, including hip ([place]) 2008, 2017   right thigh  . Osteoporosis, postmenopausal   . Overactive bladder   . Pancreatitis   . Panhypopituitarism ([place])   . Personal history of malignant melanoma of skin 2012  . Rectal bleeding   . Supraglottitis   . Tendonitis   . Tick bite of head 07/20/2015  . Weight loss, unintentional     PAST SURGICAL HISTORY: Past Surgical History:  Procedure Laterality Date  . BACK SURGERY  2010  . BREAST CYST EXCISION  1985  . COLONOSCOPY  07/2012   [facility]  . COLONOSCOPY WITH PROPOFOL N/A 11/21/2016   Procedure: COLONOSCOPY WITH PROPOFOL;  Surgeon: Manya Silvas, MD;  Location: [facility] ENDOSCOPY;  Service: Endoscopy;  Laterality: N/A;  . ESOPHAGOGASTRODUODENOSCOPY    . ESOPHAGOGASTRODUODENOSCOPY (EGD) WITH PROPOFOL N/A 11/21/2016   Procedure: ESOPHAGOGASTRODUODENOSCOPY (EGD) WITH PROPOFOL;  Surgeon: Manya Silvas, MD;  Location: [facility] ENDOSCOPY;  Service: Endoscopy;  Laterality: N/A;  . FRACTURE SURGERY     rod removed from left femur 07/21/2014  . INGUINAL LYMPH NODE BIOPSY Right 07/17/2015   Procedure: INGUINAL LYMPH NODE BIOPSY/EXCISION;  Surgeon: Robert Bellow, MD;  Location: [facility] ORS;  Service: General;  Laterality: Right;  . laminnotomy reexploration posterior lumbar   12/26/2008   with nerve decomp and discectomy  . MELANOMA EXCISION  2008  right thigh  . PORTACATH PLACEMENT Left 03/14/2016   Procedure: INSERTION PORT-A-CATH;  Surgeon: Robert Bellow, MD;  Location: [facility] ORS;  Service: General;  Laterality: Left;  . SKIN LESION EXCISION Right 12-19-10   posterior  . SPINE SURGERY      FAMILY HISTORY: Family History    Problem Relation Age of Onset  . COPD Father   . Cancer Sister        Colon, sister       ADVANCED DIRECTIVES:    HEALTH MAINTENANCE: Social History  Substance Use Topics  . Smoking status: Current Every Day Smoker    Packs/day: 0.50    Years: 20.00    Types: Cigarettes  . Smokeless tobacco: Never Used  . Alcohol use 0.0 oz/week     Comment: ocassionally     Colonoscopy:  PAP:  Bone density:  Lipid panel:  Allergies  Allergen Reactions  . Clindamycin Shortness Of Breath  . Levaquin [Levofloxacin] Other (See Comments)    Joint inflammation  . Aripiprazole Other (See Comments)    Facial and head ticks  . Depakote [Divalproex Sodium] Other (See Comments)  . Valproic Acid     Other reaction(s): Other (See Comments) Facial twitching    Current Outpatient Prescriptions  Medication Sig Dispense Refill  . aspirin-acetaminophen-caffeine (EXCEDRIN MIGRAINE) 250-250-65 MG tablet Take 2 tablets by mouth daily as needed for headache.    . calcium carbonate (OS-CAL) 600 MG TABS tablet Take 600 mg by mouth 2 (two) times daily with a meal.    . clonazePAM (KLONOPIN) 0.5 MG tablet Take 0.5 mg by mouth at bedtime.    . cyanocobalamin (,VITAMIN B-12,) 1000 MCG/ML injection INJECT 1ML INTO THE MUSCLE MONTHLY  6  . furosemide (LASIX) 20 MG tablet Take 20 mg by mouth as needed.     . gabapentin (NEURONTIN) 300 MG capsule Take 300 mg by mouth 3 (three) times daily.    Marland Kitchen HYDROcodone-acetaminophen (NORCO/VICODIN) 5-325 MG tablet Take 1 tablet by mouth every 4 (four) hours as needed for moderate pain. 60 tablet 0  . hydrocortisone (CORTEF) 10 MG tablet Take 10-15 mg by mouth 2 (two) times daily. 1.5 tablets (15 MG) in the morning and 1 tablet (10 MG) in the evening    . levothyroxine (SYNTHROID, LEVOTHROID) 112 MCG tablet Take 112 mcg by mouth daily before breakfast.     . lipase/protease/amylase (CREON) 12000 units CPEP capsule Take 12,000 Units by mouth 3 (three) times daily with meals.     . Metoclopramide HCl (REGLAN PO) Take by mouth.    . metoprolol tartrate (LOPRESSOR) 25 MG tablet Take 12.5 mg by mouth 2 (two) times daily.     . Multiple Vitamin (MULTIVITAMIN WITH MINERALS) TABS tablet Take 1 tablet by mouth daily.    . pantoprazole (PROTONIX) 40 MG tablet Take 40 mg by mouth 2 (two) times daily.     . predniSONE (DELTASONE) 10 MG tablet TAKE 1 TABLET (10 MG TOTAL) BY MOUTH DAILY WITH BREAKFAST. 30 tablet 0  . trametinib dimethyl sulfoxide (MEKINIST) 2 MG tablet Take 1 tablet (2 mg total) by mouth daily. Take 1 hour before or 2 hours after a meal. Store refrigerated in original container. 30 tablet 5  . venlafaxine XR (EFFEXOR-XR) 150 MG 24 hr capsule 150 mg every morning.     . venlafaxine XR (EFFEXOR-XR) 75 MG 24 hr capsule Take 75 mg by mouth every evening.    . ondansetron (ZOFRAN) 4 MG tablet Take 4 mg by  mouth every 8 (eight) hours as needed for nausea or vomiting.    . rifampin (RIFADIN) 300 MG capsule Take 300 mg by mouth daily.     No current facility-administered medications for this visit.     OBJECTIVE: Vitals:   12/12/16 1008  BP: 135/90  Pulse: 77  Resp: 18  Temp: 97.7 F (36.5 C)     Body mass index is 23.09 kg/m.    ECOG FS:0 - Asymptomatic  General: Well-developed, well-nourished, no acute distress. Eyes: Pink conjunctiva, anicteric sclera. Lungs: Clear to auscultation bilaterally. Heart: Regular rate and rhythm. No rubs, murmurs, or gallops. Abdomen: Soft, nontender, nondistended. No organomegaly noted, normoactive bowel sounds. Musculoskeletal: No edema, cyanosis, or clubbing. Mild swelling of joints, no decrease in strength. Mild tenderness to touch. Neuro: Alert, answering all questions appropriately. Cranial nerves grossly intact. Skin: No rash or petechiae noted. Psych: Normal affect.   LAB RESULTS:  Lab Results  Component Value Date   NA 136 12/12/2016   K 4.0 12/12/2016   CL 102 12/12/2016   CO2 25 12/12/2016   GLUCOSE 102 (H)  12/12/2016   BUN 19 12/12/2016   CREATININE 2.13 (H) 12/12/2016   CALCIUM 8.4 (L) 12/12/2016   PROT 6.0 (L) 12/12/2016   ALBUMIN 3.4 (L) 12/12/2016   AST 37 12/12/2016   ALT 22 12/12/2016   ALKPHOS 149 (H) 12/12/2016   BILITOT 0.5 12/12/2016   GFRNONAA 25 (L) 12/12/2016   GFRAA 29 (L) 12/12/2016    Lab Results  Component Value Date   WBC 5.2 12/01/2016   NEUTROABS 3.2 12/01/2016   HGB 11.1 (L) 12/01/2016   HCT 32.0 (L) 12/01/2016   MCV 92.5 12/01/2016   PLT 287 12/01/2016    STUDIES: No results found.  ONCOLOGIC HISTORY: Patient initially diagnosed with metastatic melanoma to right thigh with unknown primary in 2009. She received 4 weeks of high-dose interferon therapy in February 2009 and proceed with low-dose interferon therapy in March 2009. Patient remained in remission until August 2012 when she was noted to have recurrent melanoma and a pelvic lymph node. October 2012 she had 2 cycles of Ipilumimab which was discontinued secondary to poor tolerance. In April 2017 PET scan showed recurrent disease in the right inguinal region which was confirmed by biopsy. Zeboraf discontinued in November 2017 secondary to intolerable rash. Patient initiated combination immunotherapy with nivolumab and ipilumimab on January 14, 2016. Patient initiated maintenance nivolumab on April 14, 2016 and discontinued on November 07, 2016. Single agent oral Mekinist was initiated on December 07, 2016.  ASSESSMENT: Recurrent metastatic melanoma, BRAF+, of right lower extremity, metastatic.  PLAN:    1. Recurrent metastatic melanoma, BRAF+, of right lower extremity, metastatic:  PET scan results from October 16, 2016 reviewed independently with continued improvement of disease burden. She completed palliative XRT to her iliac crest lesion on August 11, 2016. Because of patient's persistent grade 3 diarrhea will not pursue any additional treatments with nivolumab. Continue 2 mg trametinib (Mekinist) daily.  If patient tolerates this well, can attempt combination therapy using dabrafenib/trametinib in the future. Single agent Cobimetinib is also an option. Return to clinic in 3 weeks with repeat laboratory work and further evaluation. 2. Pain: Resolved. Patient states she has discontinued Plaquenil.  She continues to take hydrocodone intermittently. 3. Nausea: Continue Compazine as needed. 4. Diarrhea: Resolved with Remicade. At least grade 3 and refractory to steroids. Colonoscopy and EGD were unrevealing. 5. Pancreatitis: Unclear etiology. Continue follow-up with GI as indicated. 6. Elevated creatinine:  Patient noted to have increasing creatinine over the past month. She states her primary care physician has already sent a referral to nephrology.  Approximately 30 minutes was spent in discussion of which greater than 50% was consultation.  Patient expressed understanding and was in agreement with this plan. She also understands that She can call clinic at any time with any questions, concerns, or complaints.    Lloyd Huger, MD 12/12/16 10:23 AM

## 2016-12-12 ENCOUNTER — Inpatient Hospital Stay: Payer: Medicare Other

## 2016-12-12 ENCOUNTER — Inpatient Hospital Stay (HOSPITAL_BASED_OUTPATIENT_CLINIC_OR_DEPARTMENT_OTHER): Payer: Medicare Other | Admitting: Oncology

## 2016-12-12 VITALS — BP 135/90 | HR 77 | Temp 97.7°F | Resp 18 | Wt 147.4 lb

## 2016-12-12 DIAGNOSIS — Z79899 Other long term (current) drug therapy: Secondary | ICD-10-CM

## 2016-12-12 DIAGNOSIS — F319 Bipolar disorder, unspecified: Secondary | ICD-10-CM

## 2016-12-12 DIAGNOSIS — R197 Diarrhea, unspecified: Secondary | ICD-10-CM

## 2016-12-12 DIAGNOSIS — Z5112 Encounter for antineoplastic immunotherapy: Secondary | ICD-10-CM | POA: Diagnosis not present

## 2016-12-12 DIAGNOSIS — R634 Abnormal weight loss: Secondary | ICD-10-CM | POA: Diagnosis not present

## 2016-12-12 DIAGNOSIS — M2550 Pain in unspecified joint: Secondary | ICD-10-CM

## 2016-12-12 DIAGNOSIS — R944 Abnormal results of kidney function studies: Secondary | ICD-10-CM

## 2016-12-12 DIAGNOSIS — R110 Nausea: Secondary | ICD-10-CM | POA: Diagnosis not present

## 2016-12-12 DIAGNOSIS — I429 Cardiomyopathy, unspecified: Secondary | ICD-10-CM

## 2016-12-12 DIAGNOSIS — C4371 Malignant melanoma of right lower limb, including hip: Secondary | ICD-10-CM

## 2016-12-12 DIAGNOSIS — N3281 Overactive bladder: Secondary | ICD-10-CM

## 2016-12-12 DIAGNOSIS — G4700 Insomnia, unspecified: Secondary | ICD-10-CM | POA: Diagnosis not present

## 2016-12-12 DIAGNOSIS — F329 Major depressive disorder, single episode, unspecified: Secondary | ICD-10-CM

## 2016-12-12 DIAGNOSIS — C779 Secondary and unspecified malignant neoplasm of lymph node, unspecified: Principal | ICD-10-CM

## 2016-12-12 DIAGNOSIS — Z800 Family history of malignant neoplasm of digestive organs: Secondary | ICD-10-CM

## 2016-12-12 DIAGNOSIS — R5383 Other fatigue: Secondary | ICD-10-CM | POA: Diagnosis not present

## 2016-12-12 DIAGNOSIS — E039 Hypothyroidism, unspecified: Secondary | ICD-10-CM

## 2016-12-12 DIAGNOSIS — M810 Age-related osteoporosis without current pathological fracture: Secondary | ICD-10-CM

## 2016-12-12 DIAGNOSIS — I10 Essential (primary) hypertension: Secondary | ICD-10-CM

## 2016-12-12 DIAGNOSIS — K859 Acute pancreatitis without necrosis or infection, unspecified: Secondary | ICD-10-CM

## 2016-12-12 DIAGNOSIS — C439 Malignant melanoma of skin, unspecified: Secondary | ICD-10-CM

## 2016-12-12 DIAGNOSIS — F17210 Nicotine dependence, cigarettes, uncomplicated: Secondary | ICD-10-CM

## 2016-12-12 DIAGNOSIS — E230 Hypopituitarism: Secondary | ICD-10-CM

## 2016-12-12 DIAGNOSIS — R531 Weakness: Secondary | ICD-10-CM | POA: Diagnosis not present

## 2016-12-12 DIAGNOSIS — I509 Heart failure, unspecified: Secondary | ICD-10-CM

## 2016-12-12 DIAGNOSIS — K219 Gastro-esophageal reflux disease without esophagitis: Secondary | ICD-10-CM

## 2016-12-12 LAB — COMPREHENSIVE METABOLIC PANEL
ALT: 22 U/L (ref 14–54)
AST: 37 U/L (ref 15–41)
Albumin: 3.4 g/dL — ABNORMAL LOW (ref 3.5–5.0)
Alkaline Phosphatase: 149 U/L — ABNORMAL HIGH (ref 38–126)
Anion gap: 9 (ref 5–15)
BUN: 19 mg/dL (ref 6–20)
CHLORIDE: 102 mmol/L (ref 101–111)
CO2: 25 mmol/L (ref 22–32)
Calcium: 8.4 mg/dL — ABNORMAL LOW (ref 8.9–10.3)
Creatinine, Ser: 2.13 mg/dL — ABNORMAL HIGH (ref 0.44–1.00)
GFR, EST AFRICAN AMERICAN: 29 mL/min — AB (ref >60)
GFR, EST NON AFRICAN AMERICAN: 25 mL/min — AB (ref >60)
Glucose, Bld: 102 mg/dL — ABNORMAL HIGH (ref 65–99)
POTASSIUM: 4 mmol/L (ref 3.5–5.1)
Sodium: 136 mmol/L (ref 135–145)
Total Bilirubin: 0.5 mg/dL (ref 0.3–1.2)
Total Protein: 6 g/dL — ABNORMAL LOW (ref 6.5–8.1)

## 2016-12-12 LAB — MAGNESIUM: MAGNESIUM: 2.4 mg/dL (ref 1.7–2.4)

## 2016-12-12 NOTE — Progress Notes (Signed)
Nutrition Assessment   Reason for Assessment:   Patient request to meet with RD regarding nutrition therapy for pancreatitis  ASSESSMENT:  56 year old female with recurrent metastatic melanoma of right lower extremity.  Patient currently on mekinist.  Past medical history of CHF, GERD, HTN, pancreatitis followed by GI.    Met with patient following MD visit today.  Patient reports she feels good and appetite is good.  Reports diarrhea has lessened after receiving infusion of remicade.  Noted had colonscopy and endoscopy recently.  Followed by GI for pancreatitis (2 hospital flares per pt report).  Reports can only take creon 1 time per day, planning to try 2 this week.  Reports makes her feel nauseated even with adjusting nausea medication.  Has been reducing fat in meals.  Does report if have high fat meal or snack diarrhea increases and notices oily film in toilet.    Nutrition Focused Physical Exam: deferred  Medications: protonix, creon, zofran, MVI, Vit B12, calcium carbonate, lasix  Labs: glucose, creatine 2.13  Anthropometrics:   Height: 67 inches Weight: 147 lb 6.4 oz today UBW: 160s BMI: 23   Estimated Energy Needs  Kcals: 2000-2300 calories/d Protein: 82-102 g/d Fluid: > 2 L/d Fat: 55-66 g/d  NUTRITION DIAGNOSIS: Inadequate oral intake related to pancreatitis and altered GI function as evidenced by oily diarrhea with fat intake, weight loss   MALNUTRITION DIAGNOSIS: continue to monitor   INTERVENTION:   Discussed Pancreatitis Medical Nutrition Therapy.  Handout from [employer] given to patient today.   Patient reports will be seeing a new GI doctor in the future.  ? If can tolerate different pancreatic enzyme better, ? Psyllium fiber may help. Recommend continue with MVI Contact information given to patient and encouraged to contact me with questions.      MONITORING, EVALUATION, GOAL: weight trends, intake, diarrhea   NEXT VISIT:  as needed  Mazen Marcin B. Zenia Resides, [place], [place] Registered Dietitian [phone] (pager)

## 2016-12-12 NOTE — Progress Notes (Signed)
Patient reports diarrhea is much improved since Remicade infusion. Patient has received Mekinist and is tolerating well.

## 2016-12-13 DIAGNOSIS — N183 Chronic kidney disease, stage 3 (moderate): Secondary | ICD-10-CM | POA: Diagnosis not present

## 2016-12-13 DIAGNOSIS — I5022 Chronic systolic (congestive) heart failure: Secondary | ICD-10-CM | POA: Diagnosis not present

## 2016-12-13 DIAGNOSIS — R9431 Abnormal electrocardiogram [ECG] [EKG]: Secondary | ICD-10-CM | POA: Diagnosis not present

## 2016-12-13 DIAGNOSIS — I10 Essential (primary) hypertension: Secondary | ICD-10-CM | POA: Diagnosis not present

## 2016-12-13 DIAGNOSIS — Z720 Tobacco use: Secondary | ICD-10-CM | POA: Diagnosis not present

## 2016-12-13 DIAGNOSIS — I340 Nonrheumatic mitral (valve) insufficiency: Secondary | ICD-10-CM | POA: Diagnosis not present

## 2016-12-19 ENCOUNTER — Other Ambulatory Visit: Payer: Self-pay | Admitting: *Deleted

## 2016-12-19 ENCOUNTER — Encounter: Payer: Self-pay | Admitting: Oncology

## 2016-12-19 DIAGNOSIS — E038 Other specified hypothyroidism: Secondary | ICD-10-CM | POA: Diagnosis not present

## 2016-12-19 MED ORDER — PANTOPRAZOLE SODIUM 40 MG PO TBEC
40.0000 mg | DELAYED_RELEASE_TABLET | Freq: Two times a day (BID) | ORAL | 3 refills | Status: DC
Start: 2016-12-19 — End: 2017-04-18

## 2016-12-20 ENCOUNTER — Encounter: Payer: Self-pay | Admitting: Oncology

## 2016-12-20 MED ORDER — NYSTATIN 100000 UNIT/ML MT SUSP: 5.0000 mL | Freq: Four times a day (QID) | OROMUCOSAL | 1 refills | Status: DC

## 2016-12-22 ENCOUNTER — Other Ambulatory Visit: Payer: Self-pay | Admitting: Oncology

## 2016-12-28 NOTE — Progress Notes (Signed)
[place]  Telephone:([phone] [phone]  ID: Patricia Li OB: 10-02-1960  MR#: [id number]  BHA#:[id number]  Patient Care Team: Patient, No Pcp Per as PCP - General ([place]) Bary Castilla, Forest Gleason, MD as Consulting Physician (General Surgery) Forest Gleason, MD (Oncology) Lloyd Huger, MD as Consulting Physician (Oncology)  CHIEF COMPLAINT: Recurrent metastatic melanoma, BRAF+, of right lower extremity, metastatic.  INTERVAL HISTORY: Patient returns to clinic today for repeat laboratory work and further evaluation.  She currently feels well and is asymptomatic.  She continues to tolerate Mekinist without significant side effects.  She has no neurologic complaints. She denies any recent fevers or illnesses. She has a good appetite. She denies any chest pain or shortness of breath. She has no nausea, vomiting, or constipation.  She has no further diarrhea.  She denies any joint pain.  She has no urinary complaints. Patient no specific complaints today  REVIEW OF SYSTEMS:   Review of Systems  Constitutional: Negative for fever, malaise/fatigue and weight loss.  HENT: Negative.  Negative for ear pain and tinnitus.   Eyes: Negative.   Respiratory: Negative for cough and shortness of breath.   Cardiovascular: Negative.  Negative for chest pain and leg swelling.  Gastrointestinal: Negative for abdominal pain, blood in stool, diarrhea, heartburn and melena.  Genitourinary: Negative.   Musculoskeletal: Positive for back pain. Negative for joint pain.  Skin: Negative for itching and rash.  Neurological: Negative for sensory change and weakness.  Endo/Heme/Allergies: Does not bruise/bleed easily.  Psychiatric/Behavioral: Negative.  Negative for depression. The patient is not nervous/anxious and does not have insomnia.     As per HPI. Otherwise, a complete review of systems is negative.  PAST MEDICAL HISTORY: Past Medical History:  Diagnosis  Date  . Adrenal insufficiency ([place])   . Bipolar disorder ([place])   . Cardiomyopathy ([place])   . CHF (congestive heart failure) ([place]) 2016   Dr. Nehemiah Massed  . Depression   . GERD (gastroesophageal reflux disease) 2012  . Hypertension   . Hypothyroidism   . Joint pain   . Lumbar herniated disc 2010  . Malignant melanoma of skin of lower limb, including hip ([place]) 2008, 2017   right thigh  . Osteoporosis, postmenopausal   . Overactive bladder   . Pancreatitis   . Panhypopituitarism ([place])   . Personal history of malignant melanoma of skin 2012  . Rectal bleeding   . Supraglottitis   . Tendonitis   . Tick bite of head 07/20/2015  . Weight loss, unintentional     PAST SURGICAL HISTORY: Past Surgical History:  Procedure Laterality Date  . BACK SURGERY  2010  . BREAST CYST EXCISION  1985  . COLONOSCOPY  07/2012   [facility]  . ESOPHAGOGASTRODUODENOSCOPY    . FRACTURE SURGERY     rod removed from left femur 07/21/2014  . laminnotomy reexploration posterior lumbar   12/26/2008   with nerve decomp and discectomy  . MELANOMA EXCISION  2008   right thigh  . SKIN LESION EXCISION Right 12-19-10   posterior  . SPINE SURGERY      FAMILY HISTORY: Family History  Problem Relation Age of Onset  . COPD Father   . Cancer Sister        Colon, sister       ADVANCED DIRECTIVES:    HEALTH MAINTENANCE: Social History   Tobacco Use  . Smoking status: Current Every Day Smoker    Packs/day: 0.50    Years: 20.00  Pack years: 10.00    Types: Cigarettes  . Smokeless tobacco: Never Used  Substance Use Topics  . Alcohol use: Yes    Alcohol/week: 0.0 oz    Comment: ocassionally  . Drug use: No     Colonoscopy:  PAP:  Bone density:  Lipid panel:  Allergies  Allergen Reactions  . Clindamycin Shortness Of Breath  . Levaquin [Levofloxacin] Other (See Comments)    Joint inflammation  . Aripiprazole Other (See Comments)    Facial and head ticks  . Depakote [Divalproex Sodium] Other  (See Comments)  . Valproic Acid     Other reaction(s): Other (See Comments) Facial twitching    Current Outpatient Medications  Medication Sig Dispense Refill  . aspirin-acetaminophen-caffeine (EXCEDRIN MIGRAINE) 250-250-65 MG tablet Take 2 tablets by mouth daily as needed for headache.    . calcium carbonate (OS-CAL) 600 MG TABS tablet Take 600 mg by mouth 2 (two) times daily with a meal.    . clonazePAM (KLONOPIN) 0.5 MG tablet Take 0.5 mg by mouth at bedtime.    . cyanocobalamin (,VITAMIN B-12,) 1000 MCG/ML injection INJECT 1ML INTO THE MUSCLE MONTHLY  6  . furosemide (LASIX) 20 MG tablet Take 20 mg by mouth as needed.     . gabapentin (NEURONTIN) 300 MG capsule Take 300 mg by mouth 3 (three) times daily.    Marland Kitchen HYDROcodone-acetaminophen (NORCO/VICODIN) 5-325 MG tablet Take 1 tablet by mouth every 4 (four) hours as needed for moderate pain. 60 tablet 0  . hydrocortisone (CORTEF) 10 MG tablet Take 10-15 mg by mouth 2 (two) times daily. 1.5 tablets (15 MG) in the morning and 1 tablet (10 MG) in the evening    . levothyroxine (SYNTHROID, LEVOTHROID) 112 MCG tablet Take 112 mcg by mouth daily before breakfast.     . Metoclopramide HCl (REGLAN PO) Take by mouth.    . metoprolol tartrate (LOPRESSOR) 25 MG tablet Take 12.5 mg by mouth 2 (two) times daily.     . Multiple Vitamin (MULTIVITAMIN WITH MINERALS) TABS tablet Take 1 tablet by mouth daily.    Marland Kitchen nystatin (MYCOSTATIN) 100000 UNIT/ML suspension Take 5 mLs (500,000 Units total) by mouth 4 (four) times daily. 120 mL 1  . ondansetron (ZOFRAN) 4 MG tablet Take 4 mg by mouth every 8 (eight) hours as needed for nausea or vomiting.    . pantoprazole (PROTONIX) 40 MG tablet Take 1 tablet (40 mg total) by mouth 2 (two) times daily. 60 tablet 3  . predniSONE (DELTASONE) 10 MG tablet TAKE 1 TABLET (10 MG TOTAL) BY MOUTH DAILY WITH BREAKFAST. 30 tablet 0  . rifampin (RIFADIN) 300 MG capsule Take 300 mg by mouth daily.    . trametinib dimethyl sulfoxide  (MEKINIST) 2 MG tablet Take 1 tablet (2 mg total) by mouth daily. Take 1 hour before or 2 hours after a meal. Store refrigerated in original container. 30 tablet 5  . venlafaxine XR (EFFEXOR-XR) 150 MG 24 hr capsule 150 mg every morning.     . venlafaxine XR (EFFEXOR-XR) 75 MG 24 hr capsule Take 75 mg by mouth every evening.    . lipase/protease/amylase (CREON) 12000 units CPEP capsule Take 12,000 Units by mouth 3 (three) times daily with meals.     No current facility-administered medications for this visit.     OBJECTIVE: Vitals:   01/02/17 1144  BP: 129/86  Pulse: 62  Resp: 18  Temp: 97.6 F (36.4 C)     Body mass index is 22.82 kg/m.  ECOG FS:0 - Asymptomatic  General: Well-developed, well-nourished, no acute distress. Eyes: Pink conjunctiva, anicteric sclera. Lungs: Clear to auscultation bilaterally. Heart: Regular rate and rhythm. No rubs, murmurs, or gallops. Abdomen: Soft, nontender, nondistended. No organomegaly noted, normoactive bowel sounds. Musculoskeletal: No edema, cyanosis, or clubbing.  Neuro: Alert, answering all questions appropriately. Cranial nerves grossly intact. Skin: No rash or petechiae noted.  Psych: Normal affect.   LAB RESULTS:  Lab Results  Component Value Date   NA 136 01/02/2017   K 4.1 01/02/2017   CL 102 01/02/2017   CO2 29 01/02/2017   GLUCOSE 70 01/02/2017   BUN 17 01/02/2017   CREATININE 1.69 (H) 01/02/2017   CALCIUM 8.2 (L) 01/02/2017   PROT 5.6 (L) 01/02/2017   ALBUMIN 3.4 (L) 01/02/2017   AST 69 (H) 01/02/2017   ALT 36 01/02/2017   ALKPHOS 123 01/02/2017   BILITOT 0.5 01/02/2017   GFRNONAA 33 (L) 01/02/2017   GFRAA 38 (L) 01/02/2017    Lab Results  Component Value Date   WBC 4.6 01/02/2017   NEUTROABS 2.3 01/02/2017   HGB 12.7 01/02/2017   HCT 38.2 01/02/2017   MCV 95.0 01/02/2017   PLT 212 01/02/2017    STUDIES: No results found.  ONCOLOGIC HISTORY: Patient initially diagnosed with metastatic melanoma to right  thigh with unknown primary in 2009. She received 4 weeks of high-dose interferon therapy in February 2009 and proceed with low-dose interferon therapy in March 2009. Patient remained in remission until August 2012 when she was noted to have recurrent melanoma and a pelvic lymph node. October 2012 she had 2 cycles of Ipilumimab which was discontinued secondary to poor tolerance. In April 2017 PET scan showed recurrent disease in the right inguinal region which was confirmed by biopsy. Zeboraf discontinued in November 2017 secondary to intolerable rash. Patient initiated combination immunotherapy with nivolumab and ipilumimab on January 14, 2016. Patient initiated maintenance nivolumab on April 14, 2016 and discontinued on November 07, 2016. Single agent oral Mekinist was initiated on December 07, 2016.  ASSESSMENT: Recurrent metastatic melanoma, BRAF+, of right lower extremity, metastatic.  PLAN:    1. Recurrent metastatic melanoma, BRAF+, of right lower extremity, metastatic:  PET scan results from October 16, 2016 reviewed independently with continued improvement of disease burden. She completed palliative XRT to her iliac crest lesion on August 11, 2016. Because of patient's persistent grade 3 diarrhea will not pursue any additional treatments with nivolumab. Continue 2 mg trametinib (Mekinist) daily. If patient tolerates this well, can attempt combination therapy using dabrafenib/trametinib in the future. Single agent Cobimetinib is also an option. Return to clinic in 4 weeks with repeat laboratory work and further evaluation.  Will reimage with PET scan prior to next clinic appointment. 2. Pain: Resolved. Patient states she has discontinued Plaquenil.  She no longer takes hydrocodone.   3. Nausea: Resolved.  Continue Compazine as needed. 4. Diarrhea: Resolved with Remicade. At least grade 3 and refractory to steroids. Colonoscopy and EGD were unrevealing. 5. Pancreatitis: Unclear etiology. Continue  follow-up with GI as indicated. 6. Elevated creatinine: Patient noted to have increasing creatinine over the past month, but now it is trending back down. She states her primary care physician has already sent a referral to nephrology.  Approximately 30 minutes was spent in discussion of which greater than 50% was consultation.  Patient expressed understanding and was in agreement with this plan. She also understands that She can call clinic at any time with any questions, concerns, or complaints.  Lloyd Huger, MD 01/02/17 1:07 PM

## 2017-01-02 ENCOUNTER — Inpatient Hospital Stay: Payer: Medicare Other

## 2017-01-02 ENCOUNTER — Inpatient Hospital Stay: Payer: Medicare Other | Attending: Oncology | Admitting: Oncology

## 2017-01-02 VITALS — BP 129/86 | HR 62 | Temp 97.6°F | Resp 18 | Wt 145.7 lb

## 2017-01-02 DIAGNOSIS — I509 Heart failure, unspecified: Secondary | ICD-10-CM

## 2017-01-02 DIAGNOSIS — F319 Bipolar disorder, unspecified: Secondary | ICD-10-CM | POA: Insufficient documentation

## 2017-01-02 DIAGNOSIS — C779 Secondary and unspecified malignant neoplasm of lymph node, unspecified: Principal | ICD-10-CM

## 2017-01-02 DIAGNOSIS — Z800 Family history of malignant neoplasm of digestive organs: Secondary | ICD-10-CM | POA: Diagnosis not present

## 2017-01-02 DIAGNOSIS — N2889 Other specified disorders of kidney and ureter: Secondary | ICD-10-CM

## 2017-01-02 DIAGNOSIS — F17210 Nicotine dependence, cigarettes, uncomplicated: Secondary | ICD-10-CM | POA: Diagnosis not present

## 2017-01-02 DIAGNOSIS — R51 Headache: Secondary | ICD-10-CM | POA: Insufficient documentation

## 2017-01-02 DIAGNOSIS — M2550 Pain in unspecified joint: Secondary | ICD-10-CM | POA: Insufficient documentation

## 2017-01-02 DIAGNOSIS — C439 Malignant melanoma of skin, unspecified: Secondary | ICD-10-CM

## 2017-01-02 DIAGNOSIS — E039 Hypothyroidism, unspecified: Secondary | ICD-10-CM | POA: Insufficient documentation

## 2017-01-02 DIAGNOSIS — E230 Hypopituitarism: Secondary | ICD-10-CM | POA: Diagnosis not present

## 2017-01-02 DIAGNOSIS — I10 Essential (primary) hypertension: Secondary | ICD-10-CM | POA: Diagnosis not present

## 2017-01-02 DIAGNOSIS — K219 Gastro-esophageal reflux disease without esophagitis: Secondary | ICD-10-CM | POA: Diagnosis not present

## 2017-01-02 DIAGNOSIS — F329 Major depressive disorder, single episode, unspecified: Secondary | ICD-10-CM | POA: Diagnosis not present

## 2017-01-02 DIAGNOSIS — R634 Abnormal weight loss: Secondary | ICD-10-CM | POA: Diagnosis not present

## 2017-01-02 DIAGNOSIS — K859 Acute pancreatitis without necrosis or infection, unspecified: Secondary | ICD-10-CM | POA: Diagnosis not present

## 2017-01-02 DIAGNOSIS — R21 Rash and other nonspecific skin eruption: Secondary | ICD-10-CM | POA: Diagnosis not present

## 2017-01-02 DIAGNOSIS — C4371 Malignant melanoma of right lower limb, including hip: Secondary | ICD-10-CM | POA: Insufficient documentation

## 2017-01-02 DIAGNOSIS — Z79899 Other long term (current) drug therapy: Secondary | ICD-10-CM | POA: Diagnosis not present

## 2017-01-02 DIAGNOSIS — M810 Age-related osteoporosis without current pathological fracture: Secondary | ICD-10-CM | POA: Insufficient documentation

## 2017-01-02 DIAGNOSIS — R944 Abnormal results of kidney function studies: Secondary | ICD-10-CM | POA: Diagnosis not present

## 2017-01-02 LAB — PHOSPHORUS: Phosphorus: 3.1 mg/dL (ref 2.5–4.6)

## 2017-01-02 LAB — CBC WITH DIFFERENTIAL/PLATELET
BASOS ABS: 0.1 10*3/uL (ref 0–0.1)
Basophils Relative: 1 %
Eosinophils Absolute: 0.1 10*3/uL (ref 0–0.7)
Eosinophils Relative: 2 %
HEMATOCRIT: 38.2 % (ref 35.0–47.0)
HEMOGLOBIN: 12.7 g/dL (ref 12.0–16.0)
LYMPHS PCT: 42 %
Lymphs Abs: 1.9 10*3/uL (ref 1.0–3.6)
MCH: 31.6 pg (ref 26.0–34.0)
MCHC: 33.3 g/dL (ref 32.0–36.0)
MCV: 95 fL (ref 80.0–100.0)
Monocytes Absolute: 0.3 10*3/uL (ref 0.2–0.9)
Monocytes Relative: 7 %
NEUTROS ABS: 2.3 10*3/uL (ref 1.4–6.5)
NEUTROS PCT: 48 %
Platelets: 212 10*3/uL (ref 150–440)
RBC: 4.02 MIL/uL (ref 3.80–5.20)
RDW: 15.1 % — ABNORMAL HIGH (ref 11.5–14.5)
WBC: 4.6 10*3/uL (ref 3.6–11.0)

## 2017-01-02 LAB — COMPREHENSIVE METABOLIC PANEL
ALBUMIN: 3.4 g/dL — AB (ref 3.5–5.0)
ALT: 36 U/L (ref 14–54)
ANION GAP: 5 (ref 5–15)
AST: 69 U/L — ABNORMAL HIGH (ref 15–41)
Alkaline Phosphatase: 123 U/L (ref 38–126)
BILIRUBIN TOTAL: 0.5 mg/dL (ref 0.3–1.2)
BUN: 17 mg/dL (ref 6–20)
CO2: 29 mmol/L (ref 22–32)
Calcium: 8.2 mg/dL — ABNORMAL LOW (ref 8.9–10.3)
Chloride: 102 mmol/L (ref 101–111)
Creatinine, Ser: 1.69 mg/dL — ABNORMAL HIGH (ref 0.44–1.00)
GFR calc Af Amer: 38 mL/min — ABNORMAL LOW (ref >60)
GFR, EST NON AFRICAN AMERICAN: 33 mL/min — AB (ref >60)
GLUCOSE: 70 mg/dL (ref 65–99)
POTASSIUM: 4.1 mmol/L (ref 3.5–5.1)
Sodium: 136 mmol/L (ref 135–145)
TOTAL PROTEIN: 5.6 g/dL — AB (ref 6.5–8.1)

## 2017-01-02 LAB — MAGNESIUM: Magnesium: 2.3 mg/dL (ref 1.7–2.4)

## 2017-01-02 MED FILL — MEKINIST 2 MG TAB: 2 | 30 days supply | Qty: 30 | Fill #1

## 2017-01-05 DIAGNOSIS — N179 Acute kidney failure, unspecified: Secondary | ICD-10-CM | POA: Diagnosis not present

## 2017-01-05 DIAGNOSIS — N183 Chronic kidney disease, stage 3 (moderate): Secondary | ICD-10-CM | POA: Diagnosis not present

## 2017-01-09 ENCOUNTER — Telehealth: Payer: Self-pay | Admitting: *Deleted

## 2017-01-09 NOTE — Telephone Encounter (Signed)
Patient called and reports that her tongue is like "meaty, red and sore"The roof if her mouth feels scratched up and sore and the inside of her mouth is red Thinks it is from her oral chemotherapy.Please advise

## 2017-01-09 NOTE — Telephone Encounter (Signed)
She has been on her oral chemotherapy for over a month, therefore this would be unusual at this time.  She can be evaluated in symptom management clinic tomorrow if necessary.

## 2017-01-09 NOTE — Telephone Encounter (Signed)
She accepted an appointment tomorrow at 10 AM

## 2017-01-10 ENCOUNTER — Encounter: Payer: Self-pay | Admitting: Oncology

## 2017-01-10 ENCOUNTER — Other Ambulatory Visit: Payer: Self-pay

## 2017-01-10 ENCOUNTER — Inpatient Hospital Stay (HOSPITAL_BASED_OUTPATIENT_CLINIC_OR_DEPARTMENT_OTHER): Payer: Medicare Other | Admitting: Oncology

## 2017-01-10 VITALS — BP 122/88 | HR 73 | Temp 97.5°F | Resp 20 | Wt 147.4 lb

## 2017-01-10 DIAGNOSIS — E039 Hypothyroidism, unspecified: Secondary | ICD-10-CM

## 2017-01-10 DIAGNOSIS — C779 Secondary and unspecified malignant neoplasm of lymph node, unspecified: Secondary | ICD-10-CM

## 2017-01-10 DIAGNOSIS — R51 Headache: Secondary | ICD-10-CM

## 2017-01-10 DIAGNOSIS — K121 Other forms of stomatitis: Secondary | ICD-10-CM

## 2017-01-10 DIAGNOSIS — Z79899 Other long term (current) drug therapy: Secondary | ICD-10-CM

## 2017-01-10 DIAGNOSIS — N2889 Other specified disorders of kidney and ureter: Secondary | ICD-10-CM

## 2017-01-10 DIAGNOSIS — R634 Abnormal weight loss: Secondary | ICD-10-CM

## 2017-01-10 DIAGNOSIS — I509 Heart failure, unspecified: Secondary | ICD-10-CM

## 2017-01-10 DIAGNOSIS — R519 Headache, unspecified: Secondary | ICD-10-CM

## 2017-01-10 DIAGNOSIS — F319 Bipolar disorder, unspecified: Secondary | ICD-10-CM | POA: Diagnosis not present

## 2017-01-10 DIAGNOSIS — K219 Gastro-esophageal reflux disease without esophagitis: Secondary | ICD-10-CM | POA: Diagnosis not present

## 2017-01-10 DIAGNOSIS — R944 Abnormal results of kidney function studies: Secondary | ICD-10-CM

## 2017-01-10 DIAGNOSIS — I10 Essential (primary) hypertension: Secondary | ICD-10-CM | POA: Diagnosis not present

## 2017-01-10 DIAGNOSIS — C4371 Malignant melanoma of right lower limb, including hip: Secondary | ICD-10-CM | POA: Diagnosis not present

## 2017-01-10 DIAGNOSIS — F329 Major depressive disorder, single episode, unspecified: Secondary | ICD-10-CM | POA: Diagnosis not present

## 2017-01-10 DIAGNOSIS — R21 Rash and other nonspecific skin eruption: Secondary | ICD-10-CM | POA: Diagnosis not present

## 2017-01-10 DIAGNOSIS — K117 Disturbances of salivary secretion: Secondary | ICD-10-CM

## 2017-01-10 DIAGNOSIS — M2550 Pain in unspecified joint: Secondary | ICD-10-CM

## 2017-01-10 DIAGNOSIS — F17210 Nicotine dependence, cigarettes, uncomplicated: Secondary | ICD-10-CM

## 2017-01-10 DIAGNOSIS — E230 Hypopituitarism: Secondary | ICD-10-CM

## 2017-01-10 DIAGNOSIS — K859 Acute pancreatitis without necrosis or infection, unspecified: Secondary | ICD-10-CM

## 2017-01-10 DIAGNOSIS — M810 Age-related osteoporosis without current pathological fracture: Secondary | ICD-10-CM

## 2017-01-10 DIAGNOSIS — R682 Dry mouth, unspecified: Secondary | ICD-10-CM

## 2017-01-10 DIAGNOSIS — Z800 Family history of malignant neoplasm of digestive organs: Secondary | ICD-10-CM

## 2017-01-10 NOTE — Progress Notes (Signed)
Symptom Management Consult note [facility]  Telephone:([phone] [phone]  Patient Care Team: Patient, No Pcp Per as PCP - General (General Practice) Byrnett, Forest Gleason, MD as Consulting Physician (General Surgery) Forest Gleason, MD (Oncology) Lloyd Huger, MD as Consulting Physician (Oncology)   Name of the patient: Patricia Li  [id number]  [birth year]   Date of visit: 01/12/17  Diagnosis- Recurrent metastatic melanoma, BRAF+, of right lower extremity, metastatic.  Chief complaint/ Reason for visit- oral leisons  Heme/Onc history: Recurrent metastatic melanoma, BRAF+, of right lower extremity, metastatic:  PET scan results from October 16, 2016 reviewed independently with continued improvement of disease burden. She completed palliative XRT to her iliac crest lesion on August 11, 2016. Because of patient's persistent grade 3 diarrhea will not pursue any additional treatments with nivolumab. Continue 2 mg trametinib (Mekinist) daily. If patient tolerates this well, can attempt combination therapy using dabrafenib/trametinib in the future. Single agent Cobimetinib is also an option. Return to clinic in 4 weeks with repeat laboratory work and further evaluation.  Will reimage with PET scan prior to next clinic appointment.  Interval history- Patient was last seen in clinic on 01/02/2017 by Dr. Grayland Ormond for routine follow-up and laboratory work. She felt well and was asymptomatic. She continued to tolerate Mekinist without any side effects. She denied pain, nausea and diarrhea. She had not had any further issues with pancreatitis. She was scheduled to return in 4 weeks with repeat repeat laboratory work and further evaluation. She was to have a PET scan prior to next appointment.   Patient presents today with complaints of oral lesions, dry mouth, rash and acute headache.   Mouth Sore: Started with beginning Trametinib. Approx 2 months. Mostly on  tongue and top of mouth. Not constant. Exacerbated by certain foods (ketchup and Mustard) and drinks (Ice Tea and lemonade). Stinging sensation. Tried Nystatin solution appox 2 weeks ago. Did not help. Not eating or drinking helps.    Dry Mouth: Started with beginning of oral chemo Trametinib. Approx 2 months. Constant. Has tried Biotene with some relief. Drinks water.   Rash: Approx 2 weeks ago. Hands, scalp and shoulders. Intermittent pruritis. Appear as "red sores" that "crust over". No drainage. Uses OTC steroid cream with relief.   [place]ache/Head pressure: Started 2 weeks ago. Last approximately 2 hours. Characteristics include pressure and  "whole body jittery feeling". There are no relieving factors. The headache goes away on its own. She sits or lays down when they occur. Checks heart rate during episodes and they are normal. She feels like her heart is racing. She has never fallen or "passed out". They occur daily.   ECOG FS:0 - Asymptomatic  Review of systems- Review of Systems  Constitutional: Negative for chills, fever and malaise/fatigue.  HENT: Negative.   Eyes: Negative.   Respiratory: Negative.   Cardiovascular: Negative.   Gastrointestinal: Negative.   Genitourinary: Negative.   Musculoskeletal: Negative.   Skin: Positive for rash.  Neurological: Positive for headaches. Negative for weakness.  Endo/Heme/Allergies: Negative.   Psychiatric/Behavioral: Negative.      Current treatment- Trametinib  Allergies  Allergen Reactions  . Clindamycin Shortness Of Breath  . Levaquin [Levofloxacin] Other (See Comments)    Joint inflammation  . Aripiprazole Other (See Comments)    Facial and head ticks  . Depakote [Divalproex Sodium] Other (See Comments)  . Valproic Acid     Other reaction(s): Other (See Comments) Facial twitching     Past Medical  History:  Diagnosis Date  . Adrenal insufficiency ([place])   . Bipolar disorder ([place])   . Cardiomyopathy ([place])   . CHF  (congestive heart failure) ([place]) 2016   Dr. Nehemiah Massed  . Depression   . GERD (gastroesophageal reflux disease) 2012  . Hypertension   . Hypothyroidism   . Joint pain   . Lumbar herniated disc 2010  . Malignant melanoma of skin of lower limb, including hip ([place]) 2008, 2017   right thigh  . Osteoporosis, postmenopausal   . Overactive bladder   . Pancreatitis   . Panhypopituitarism ([place])   . Personal history of malignant melanoma of skin 2012  . Rectal bleeding   . Supraglottitis   . Tendonitis   . Tick bite of head 07/20/2015  . Weight loss, unintentional      Past Surgical History:  Procedure Laterality Date  . BACK SURGERY  2010  . BREAST CYST EXCISION  1985  . COLONOSCOPY  07/2012   [facility]  . COLONOSCOPY WITH PROPOFOL N/A 11/21/2016   Procedure: COLONOSCOPY WITH PROPOFOL;  Surgeon: Manya Silvas, MD;  Location: [facility] ENDOSCOPY;  Service: Endoscopy;  Laterality: N/A;  . ESOPHAGOGASTRODUODENOSCOPY    . ESOPHAGOGASTRODUODENOSCOPY (EGD) WITH PROPOFOL N/A 11/21/2016   Procedure: ESOPHAGOGASTRODUODENOSCOPY (EGD) WITH PROPOFOL;  Surgeon: Manya Silvas, MD;  Location: [facility] ENDOSCOPY;  Service: Endoscopy;  Laterality: N/A;  . FRACTURE SURGERY     rod removed from left femur 07/21/2014  . INGUINAL LYMPH NODE BIOPSY Right 07/17/2015   Procedure: INGUINAL LYMPH NODE BIOPSY/EXCISION;  Surgeon: Robert Bellow, MD;  Location: [facility] ORS;  Service: General;  Laterality: Right;  . laminnotomy reexploration posterior lumbar   12/26/2008   with nerve decomp and discectomy  . MELANOMA EXCISION  2008   right thigh  . PORTACATH PLACEMENT Left 03/14/2016   Procedure: INSERTION PORT-A-CATH;  Surgeon: Robert Bellow, MD;  Location: [facility] ORS;  Service: General;  Laterality: Left;  . SKIN LESION EXCISION Right 12-19-10   posterior  . SPINE SURGERY      Social History   Socioeconomic History  . Marital status: Divorced    Spouse name: Not on file  . Number of children: Not on file  .  Years of education: Not on file  . Highest education level: Not on file  Social Needs  . Financial resource strain: Not on file  . Food insecurity - worry: Not on file  . Food insecurity - inability: Not on file  . Transportation needs - medical: Not on file  . Transportation needs - non-medical: Not on file  Occupational History  . Not on file  Tobacco Use  . Smoking status: Current Every Day Smoker    Packs/day: 0.50    Years: 20.00    Pack years: 10.00    Types: Cigarettes  . Smokeless tobacco: Never Used  Substance and Sexual Activity  . Alcohol use: Yes    Alcohol/week: 0.0 oz    Comment: ocassionally  . Drug use: No  . Sexual activity: Not on file  Other Topics Concern  . Not on file  Social History Narrative  . Not on file    Family History  Problem Relation Age of Onset  . COPD Father   . Cancer Sister        Colon, sister     Current Outpatient Medications:  .  aspirin-acetaminophen-caffeine (EXCEDRIN MIGRAINE) 250-250-65 MG tablet, Take 2 tablets by mouth daily as needed for headache., Disp: , Rfl:  .  calcium carbonate (OS-CAL) 600 MG TABS tablet, Take 600 mg by mouth 2 (two) times daily with a meal., Disp: , Rfl:  .  clonazePAM (KLONOPIN) 0.5 MG tablet, Take 0.5 mg by mouth at bedtime., Disp: , Rfl:  .  cyanocobalamin (,VITAMIN B-12,) 1000 MCG/ML injection, INJECT 1ML INTO THE MUSCLE MONTHLY, Disp: , Rfl: 6 .  furosemide (LASIX) 20 MG tablet, Take 20 mg by mouth as needed. , Disp: , Rfl:  .  gabapentin (NEURONTIN) 300 MG capsule, Take 300 mg by mouth 3 (three) times daily., Disp: , Rfl:  .  HYDROcodone-acetaminophen (NORCO/VICODIN) 5-325 MG tablet, Take 1 tablet by mouth every 4 (four) hours as needed for moderate pain., Disp: 60 tablet, Rfl: 0 .  hydrocortisone (CORTEF) 10 MG tablet, Take 10-15 mg by mouth 2 (two) times daily. 1.5 tablets (15 MG) in the morning and 1 tablet (10 MG) in the evening, Disp: , Rfl:  .  levothyroxine (SYNTHROID, LEVOTHROID) 112  MCG tablet, Take 112 mcg by mouth daily before breakfast. , Disp: , Rfl:  .  lipase/protease/amylase (CREON) 12000 units CPEP capsule, Take 12,000 Units by mouth 3 (three) times daily with meals., Disp: , Rfl:  .  Metoclopramide HCl (REGLAN PO), Take by mouth., Disp: , Rfl:  .  metoprolol tartrate (LOPRESSOR) 25 MG tablet, Take 12.5 mg by mouth 2 (two) times daily. , Disp: , Rfl:  .  Multiple Vitamin (MULTIVITAMIN WITH MINERALS) TABS tablet, Take 1 tablet by mouth daily., Disp: , Rfl:  .  ondansetron (ZOFRAN) 4 MG tablet, Take 4 mg by mouth every 8 (eight) hours as needed for nausea or vomiting., Disp: , Rfl:  .  pantoprazole (PROTONIX) 40 MG tablet, Take 1 tablet (40 mg total) by mouth 2 (two) times daily., Disp: 60 tablet, Rfl: 3 .  predniSONE (DELTASONE) 10 MG tablet, TAKE 1 TABLET (10 MG TOTAL) BY MOUTH DAILY WITH BREAKFAST., Disp: 30 tablet, Rfl: 0 .  trametinib dimethyl sulfoxide (MEKINIST) 2 MG tablet, Take 1 tablet (2 mg total) by mouth daily. Take 1 hour before or 2 hours after a meal. Store refrigerated in original container., Disp: 30 tablet, Rfl: 5 .  venlafaxine XR (EFFEXOR-XR) 150 MG 24 hr capsule, 150 mg every morning. , Disp: , Rfl:  .  venlafaxine XR (EFFEXOR-XR) 75 MG 24 hr capsule, Take 75 mg by mouth every evening., Disp: , Rfl:  .  nystatin (MYCOSTATIN) 100000 UNIT/ML suspension, Take 5 mLs (500,000 Units total) by mouth 4 (four) times daily. (Patient not taking: Reported on 01/10/2017), Disp: 120 mL, Rfl: 1  Physical exam:  Vitals:   01/10/17 1020  BP: 122/88  Pulse: 73  Resp: 20  Temp: (!) 97.5 F (36.4 C)  TempSrc: Tympanic  Weight: 147 lb 6.4 oz (66.9 kg)   Physical Exam  Constitutional: She is oriented to person, place, and time and well-developed, well-nourished, and in no distress.  HENT:  Head: Normocephalic and atraumatic.  Mouth/Throat: Mucous membranes are dry. No oral lesions. No lacerations.    Eyes: EOM are normal. Pupils are equal, round, and  reactive to light.  Neck: Normal range of motion. Neck supple.  Cardiovascular: Normal rate and regular rhythm.  Pulmonary/Chest: Effort normal and breath sounds normal.  Abdominal: Soft. Bowel sounds are normal.  Musculoskeletal: Normal range of motion.  Neurological: She is alert and oriented to person, place, and time.  Skin: Skin is dry. Rash noted. There is pallor.     CMP Latest Ref Rng & Units 01/02/2017  Glucose 65 - 99 mg/dL 70  BUN 6 - 20 mg/dL 17  Creatinine 0.44 - 1.00 mg/dL 1.69(H)  Sodium 135 - 145 mmol/L 136  Potassium 3.5 - 5.1 mmol/L 4.1  Chloride 101 - 111 mmol/L 102  CO2 22 - 32 mmol/L 29  Calcium 8.9 - 10.3 mg/dL 8.2(L)  Total Protein 6.5 - 8.1 g/dL 5.6(L)  Total Bilirubin 0.3 - 1.2 mg/dL 0.5  Alkaline Phos 38 - 126 U/L 123  AST 15 - 41 U/L 69(H)  ALT 14 - 54 U/L 36   CBC Latest Ref Rng & Units 01/02/2017  WBC 3.6 - 11.0 K/uL 4.6  Hemoglobin 12.0 - 16.0 g/dL 12.7  Hematocrit 35.0 - 47.0 % 38.2  Platelets 150 - 440 K/uL 212    No images are attached to the encounter.  No results found.   Assessment and plan- Patient is a 56 y.o. female who presents with xerostomia, oral pain, rash and new onset headaches. Patient appears pale and fatigued. Vitals are stable. Weight up 2 lbs. Tongue is deep red with a "beefy" appearance. No oral leisons presents but irritated areas noted along the gumlines and top of mouth. Dry mucous membranes. Rash present on knuckles of both hands and on bilateral shoulders. Small crust like lesions. No open lesions. Neuro exam is normal.   1. Oral irritation /Stomatitis: Psychologist, forensic. Tillie Rung Faxed over to pt pharmacy. Talked about good oral hygiene. Educated patient on side effects of new medication and how to use. 2. Xerostomia: Recommended OTC Biotene. Good oral hygiene.  3. Rash: Likely due to Trametinib. Patient currently using OTC steroid cream for rash which seems to be helping. Will continue to monitor this. 4.  Headaches: MRI of brain before next visit. Order placed. Given this is acute and ongoing for the past 2 weeks an MRI is warranted. Normal neuro exam.  5. RTC as scheduled to see Dr. Grayland Ormond with scheduled MRI and PET scan in December.    Visit Diagnosis 1. Stomatitis   2. Acute nonintractable headache, unspecified headache type   3. Xerostomia     Patient expressed understanding and was in agreement with this plan. She also understands that She can call clinic at any time with any questions, concerns, or complaints.   Greater than 50% was spent in counseling and coordination of care with this patient including but not limited to discussion of the relevant topics above (See A&P) including, but not limited to diagnosis and management of acute and chronic medical conditions.    Faythe Casa, AGNP-C [facility] [place]- [phone] Pager- [phone] 01/12/2017 9:59 AM

## 2017-01-10 NOTE — Progress Notes (Signed)
Patient here today as acute add regarding mouth sores. Patient has tried nystatin but has not improved.

## 2017-01-17 DIAGNOSIS — R197 Diarrhea, unspecified: Secondary | ICD-10-CM | POA: Diagnosis not present

## 2017-01-17 DIAGNOSIS — E039 Hypothyroidism, unspecified: Secondary | ICD-10-CM | POA: Diagnosis not present

## 2017-01-17 DIAGNOSIS — F17210 Nicotine dependence, cigarettes, uncomplicated: Secondary | ICD-10-CM | POA: Diagnosis not present

## 2017-01-17 DIAGNOSIS — N189 Chronic kidney disease, unspecified: Secondary | ICD-10-CM | POA: Diagnosis not present

## 2017-01-17 DIAGNOSIS — R42 Dizziness and giddiness: Secondary | ICD-10-CM | POA: Diagnosis not present

## 2017-01-17 DIAGNOSIS — I5022 Chronic systolic (congestive) heart failure: Secondary | ICD-10-CM | POA: Diagnosis not present

## 2017-01-17 DIAGNOSIS — E860 Dehydration: Secondary | ICD-10-CM | POA: Diagnosis not present

## 2017-01-17 DIAGNOSIS — I499 Cardiac arrhythmia, unspecified: Secondary | ICD-10-CM | POA: Diagnosis not present

## 2017-01-17 DIAGNOSIS — R112 Nausea with vomiting, unspecified: Secondary | ICD-10-CM | POA: Diagnosis not present

## 2017-01-17 DIAGNOSIS — K219 Gastro-esophageal reflux disease without esophagitis: Secondary | ICD-10-CM | POA: Diagnosis not present

## 2017-01-17 DIAGNOSIS — R079 Chest pain, unspecified: Secondary | ICD-10-CM | POA: Diagnosis not present

## 2017-01-17 DIAGNOSIS — R0602 Shortness of breath: Secondary | ICD-10-CM | POA: Diagnosis not present

## 2017-01-17 DIAGNOSIS — R002 Palpitations: Secondary | ICD-10-CM | POA: Diagnosis not present

## 2017-01-17 DIAGNOSIS — I959 Hypotension, unspecified: Secondary | ICD-10-CM | POA: Diagnosis not present

## 2017-01-17 DIAGNOSIS — Z79899 Other long term (current) drug therapy: Secondary | ICD-10-CM | POA: Diagnosis not present

## 2017-01-17 DIAGNOSIS — I130 Hypertensive heart and chronic kidney disease with heart failure and stage 1 through stage 4 chronic kidney disease, or unspecified chronic kidney disease: Secondary | ICD-10-CM | POA: Diagnosis not present

## 2017-01-18 ENCOUNTER — Encounter: Payer: Self-pay | Admitting: Oncology

## 2017-01-18 ENCOUNTER — Other Ambulatory Visit: Payer: Self-pay | Admitting: Oncology

## 2017-01-24 DIAGNOSIS — N183 Chronic kidney disease, stage 3 (moderate): Secondary | ICD-10-CM | POA: Diagnosis not present

## 2017-01-24 NOTE — Progress Notes (Deleted)
[place]  [phone] [phone]  ID: [name] OB: 1960-08-05  MR#: [id number]  HDQ#:[id number]  Patient Care Team: Patient, No Pcp Per as PCP - General (General Practice) Bary Castilla, Forest Gleason, MD as Consulting Physician (General Surgery) Forest Gleason, MD (Oncology) Lloyd Huger, MD as Consulting Physician (Oncology)  CHIEF COMPLAINT: Recurrent metastatic melanoma, BRAF+, of right lower extremity, metastatic.  INTERVAL HISTORY: Patient returns to clinic today for repeat laboratory work and further evaluation.  She currently feels well and is asymptomatic.  She continues to tolerate Mekinist without significant side effects.  She has no neurologic complaints. She denies any recent fevers or illnesses. She has a good appetite. She denies any chest pain or shortness of breath. She has no nausea, vomiting, or constipation.  She has no further diarrhea.  She denies any joint pain.  She has no urinary complaints. Patient no specific complaints today  REVIEW OF SYSTEMS:   Review of Systems  Constitutional: Negative for fever, malaise/fatigue and weight loss.  HENT: Negative.  Negative for ear pain and tinnitus.   Eyes: Negative.   Respiratory: Negative for cough and shortness of breath.   Cardiovascular: Negative.  Negative for chest pain and leg swelling.  Gastrointestinal: Negative for abdominal pain, blood in stool, diarrhea, heartburn and melena.  Genitourinary: Negative.   Musculoskeletal: Positive for back pain. Negative for joint pain.  Skin: Negative for itching and rash.  Neurological: Negative for sensory change and weakness.  Endo/Heme/Allergies: Does not bruise/bleed easily.  Psychiatric/Behavioral: Negative.  Negative for depression. The patient is not nervous/anxious and does not have insomnia.     As per HPI. Otherwise, a complete review of systems is negative.  PAST MEDICAL HISTORY: Past Medical History:  Diagnosis  Date  . Adrenal insufficiency ([place])   . Bipolar disorder ([place])   . Cardiomyopathy ([place])   . CHF (congestive heart failure) ([place]) 2016   Dr. Nehemiah Massed  . Depression   . GERD (gastroesophageal reflux disease) 2012  . Hypertension   . Hypothyroidism   . Joint pain   . Lumbar herniated disc 2010  . Malignant melanoma of skin of lower limb, including hip ([place]) 2008, 2017   right thigh  . Osteoporosis, postmenopausal   . Overactive bladder   . Pancreatitis   . Panhypopituitarism ([place])   . Personal history of malignant melanoma of skin 2012  . Rectal bleeding   . Supraglottitis   . Tendonitis   . Tick bite of head 07/20/2015  . Weight loss, unintentional     PAST SURGICAL HISTORY: Past Surgical History:  Procedure Laterality Date  . BACK SURGERY  2010  . BREAST CYST EXCISION  1985  . COLONOSCOPY  07/2012   [facility]  . COLONOSCOPY WITH PROPOFOL N/A 11/21/2016   Procedure: COLONOSCOPY WITH PROPOFOL;  Surgeon: Manya Silvas, MD;  Location: [facility] ENDOSCOPY;  Service: Endoscopy;  Laterality: N/A;  . ESOPHAGOGASTRODUODENOSCOPY    . ESOPHAGOGASTRODUODENOSCOPY (EGD) WITH PROPOFOL N/A 11/21/2016   Procedure: ESOPHAGOGASTRODUODENOSCOPY (EGD) WITH PROPOFOL;  Surgeon: Manya Silvas, MD;  Location: [facility] ENDOSCOPY;  Service: Endoscopy;  Laterality: N/A;  . FRACTURE SURGERY     rod removed from left femur 07/21/2014  . INGUINAL LYMPH NODE BIOPSY Right 07/17/2015   Procedure: INGUINAL LYMPH NODE BIOPSY/EXCISION;  Surgeon: Robert Bellow, MD;  Location: [facility] ORS;  Service: General;  Laterality: Right;  . laminnotomy reexploration posterior lumbar   12/26/2008   with nerve decomp and discectomy  . MELANOMA EXCISION  2008   right thigh  . PORTACATH PLACEMENT Left 03/14/2016   Procedure: INSERTION PORT-A-CATH;  Surgeon: Robert Bellow, MD;  Location: [facility] ORS;  Service: General;  Laterality: Left;  . SKIN LESION EXCISION Right 12-19-10   posterior  . SPINE SURGERY      FAMILY  HISTORY: Family History  Problem Relation Age of Onset  . COPD Father   . Cancer Sister        Colon, sister       ADVANCED DIRECTIVES:    HEALTH MAINTENANCE: Social History   Tobacco Use  . Smoking status: Current Every Day Smoker    Packs/day: 0.50    Years: 20.00    Pack years: 10.00    Types: Cigarettes  . Smokeless tobacco: Never Used  Substance Use Topics  . Alcohol use: Yes    Alcohol/week: 0.0 oz    Comment: ocassionally  . Drug use: No     Colonoscopy:  PAP:  Bone density:  Lipid panel:  Allergies  Allergen Reactions  . Clindamycin Shortness Of Breath  . Levaquin [Levofloxacin] Other (See Comments)    Joint inflammation  . Aripiprazole Other (See Comments)    Facial and head ticks  . Depakote [Divalproex Sodium] Other (See Comments)  . Valproic Acid     Other reaction(s): Other (See Comments) Facial twitching    Current Outpatient Medications  Medication Sig Dispense Refill  . aspirin-acetaminophen-caffeine (EXCEDRIN MIGRAINE) 250-250-65 MG tablet Take 2 tablets by mouth daily as needed for headache.    . calcium carbonate (OS-CAL) 600 MG TABS tablet Take 600 mg by mouth 2 (two) times daily with a meal.    . clonazePAM (KLONOPIN) 0.5 MG tablet Take 0.5 mg by mouth at bedtime.    . cyanocobalamin (,VITAMIN B-12,) 1000 MCG/ML injection INJECT 1ML INTO THE MUSCLE MONTHLY  6  . furosemide (LASIX) 20 MG tablet Take 20 mg by mouth as needed.     . gabapentin (NEURONTIN) 300 MG capsule Take 300 mg by mouth 3 (three) times daily.    Marland Kitchen HYDROcodone-acetaminophen (NORCO/VICODIN) 5-325 MG tablet Take 1 tablet by mouth every 4 (four) hours as needed for moderate pain. 60 tablet 0  . hydrocortisone (CORTEF) 10 MG tablet Take 10-15 mg by mouth 2 (two) times daily. 1.5 tablets (15 MG) in the morning and 1 tablet (10 MG) in the evening    . levothyroxine (SYNTHROID, LEVOTHROID) 112 MCG tablet Take 112 mcg by mouth daily before breakfast.     . lipase/protease/amylase  (CREON) 12000 units CPEP capsule Take 12,000 Units by mouth 3 (three) times daily with meals.    . Metoclopramide HCl (REGLAN PO) Take by mouth.    . metoprolol tartrate (LOPRESSOR) 25 MG tablet Take 12.5 mg by mouth 2 (two) times daily.     . Multiple Vitamin (MULTIVITAMIN WITH MINERALS) TABS tablet Take 1 tablet by mouth daily.    Marland Kitchen nystatin (MYCOSTATIN) 100000 UNIT/ML suspension Take 5 mLs (500,000 Units total) by mouth 4 (four) times daily. (Patient not taking: Reported on 01/10/2017) 120 mL 1  . ondansetron (ZOFRAN) 4 MG tablet Take 4 mg by mouth every 8 (eight) hours as needed for nausea or vomiting.    . pantoprazole (PROTONIX) 40 MG tablet Take 1 tablet (40 mg total) by mouth 2 (two) times daily. 60 tablet 3  . predniSONE (DELTASONE) 10 MG tablet TAKE 1 TABLET (10 MG TOTAL) BY MOUTH DAILY WITH BREAKFAST. 30 tablet 0  . trametinib dimethyl sulfoxide (MEKINIST) 2  MG tablet Take 1 tablet (2 mg total) by mouth daily. Take 1 hour before or 2 hours after a meal. Store refrigerated in original container. 30 tablet 5  . venlafaxine XR (EFFEXOR-XR) 150 MG 24 hr capsule 150 mg every morning.     . venlafaxine XR (EFFEXOR-XR) 75 MG 24 hr capsule Take 75 mg by mouth every evening.     No current facility-administered medications for this visit.     OBJECTIVE: There were no vitals filed for this visit.   There is no height or weight on file to calculate BMI.    ECOG FS:0 - Asymptomatic  General: Well-developed, well-nourished, no acute distress. Eyes: Pink conjunctiva, anicteric sclera. Lungs: Clear to auscultation bilaterally. Heart: Regular rate and rhythm. No rubs, murmurs, or gallops. Abdomen: Soft, nontender, nondistended. No organomegaly noted, normoactive bowel sounds. Musculoskeletal: No edema, cyanosis, or clubbing.  Neuro: Alert, answering all questions appropriately. Cranial nerves grossly intact. Skin: No rash or petechiae noted.  Psych: Normal affect.   LAB RESULTS:  Lab  Results  Component Value Date   NA 136 01/02/2017   K 4.1 01/02/2017   CL 102 01/02/2017   CO2 29 01/02/2017   GLUCOSE 70 01/02/2017   BUN 17 01/02/2017   CREATININE 1.69 (H) 01/02/2017   CALCIUM 8.2 (L) 01/02/2017   PROT 5.6 (L) 01/02/2017   ALBUMIN 3.4 (L) 01/02/2017   AST 69 (H) 01/02/2017   ALT 36 01/02/2017   ALKPHOS 123 01/02/2017   BILITOT 0.5 01/02/2017   GFRNONAA 33 (L) 01/02/2017   GFRAA 38 (L) 01/02/2017    Lab Results  Component Value Date   WBC 4.6 01/02/2017   NEUTROABS 2.3 01/02/2017   HGB 12.7 01/02/2017   HCT 38.2 01/02/2017   MCV 95.0 01/02/2017   PLT 212 01/02/2017    STUDIES: No results found.  ONCOLOGIC HISTORY: Patient initially diagnosed with metastatic melanoma to right thigh with unknown primary in 2009. She received 4 weeks of high-dose interferon therapy in February 2009 and proceed with low-dose interferon therapy in March 2009. Patient remained in remission until August 2012 when she was noted to have recurrent melanoma and a pelvic lymph node. October 2012 she had 2 cycles of Ipilumimab which was discontinued secondary to poor tolerance. In April 2017 PET scan showed recurrent disease in the right inguinal region which was confirmed by biopsy. Zeboraf discontinued in November 2017 secondary to intolerable rash. Patient initiated combination immunotherapy with nivolumab and ipilumimab on January 14, 2016. Patient initiated maintenance nivolumab on April 14, 2016 and discontinued on November 07, 2016. Single agent oral Mekinist was initiated on December 07, 2016.  ASSESSMENT: Recurrent metastatic melanoma, BRAF+, of right lower extremity, metastatic.  PLAN:    1. Recurrent metastatic melanoma, BRAF+, of right lower extremity, metastatic:  PET scan results from October 16, 2016 reviewed independently with continued improvement of disease burden. She completed palliative XRT to her iliac crest lesion on August 11, 2016. Because of patient's  persistent grade 3 diarrhea will not pursue any additional treatments with nivolumab. Continue 2 mg trametinib (Mekinist) daily. If patient tolerates this well, can attempt combination therapy using dabrafenib/trametinib in the future. Single agent Cobimetinib is also an option. Return to clinic in 4 weeks with repeat laboratory work and further evaluation.  Will reimage with PET scan prior to next clinic appointment. 2. Pain: Resolved. Patient states she has discontinued Plaquenil.  She no longer takes hydrocodone.   3. Nausea: Resolved.  Continue Compazine as needed. 4. Diarrhea:  Resolved with Remicade. At least grade 3 and refractory to steroids. Colonoscopy and EGD were unrevealing. 5. Pancreatitis: Unclear etiology. Continue follow-up with GI as indicated. 6. Elevated creatinine: Patient noted to have increasing creatinine over the past month, but now it is trending back down. She states her primary care physician has already sent a referral to nephrology.  Approximately 30 minutes was spent in discussion of which greater than 50% was consultation.  Patient expressed understanding and was in agreement with this plan. She also understands that She can call clinic at any time with any questions, concerns, or complaints.    Lloyd Huger, MD 01/24/17 10:41 PM

## 2017-01-25 ENCOUNTER — Inpatient Hospital Stay: Payer: Medicare Other | Admitting: Oncology

## 2017-01-25 ENCOUNTER — Inpatient Hospital Stay: Payer: Medicare Other

## 2017-01-27 ENCOUNTER — Other Ambulatory Visit: Payer: Self-pay | Admitting: *Deleted

## 2017-01-27 MED ORDER — PROCHLORPERAZINE MALEATE 10 MG PO TABS: 10.0000 mg | ORAL_TABLET | Freq: Four times a day (QID) | ORAL | 1 refills | Status: DC | PRN

## 2017-01-27 NOTE — Telephone Encounter (Signed)
Patient requesting refill on Compazine which has not been filled in a year. She states she has to alternate between medications because they become ineffective. Please advise

## 2017-01-30 NOTE — Telephone Encounter (Signed)
Duplicate request

## 2017-01-31 ENCOUNTER — Encounter: Payer: Self-pay | Admitting: Oncology

## 2017-01-31 MED FILL — MEKINIST 2 MG TAB: 2 | 30 days supply | Qty: 30 | Fill #2

## 2017-02-01 ENCOUNTER — Telehealth: Payer: Self-pay | Admitting: *Deleted

## 2017-02-01 ENCOUNTER — Ambulatory Visit: Payer: Medicare Other

## 2017-02-01 ENCOUNTER — Ambulatory Visit
Admission: RE | Admit: 2017-02-01 | Discharge: 2017-02-01 | Disposition: A | Discharge status: Home / Self Care | Payer: Medicare Other | Source: Ambulatory Visit | Attending: Oncology | Admitting: Oncology

## 2017-02-01 ENCOUNTER — Other Ambulatory Visit: Payer: Self-pay | Admitting: *Deleted

## 2017-02-01 DIAGNOSIS — C792 Secondary malignant neoplasm of skin: Secondary | ICD-10-CM | POA: Diagnosis not present

## 2017-02-01 DIAGNOSIS — R51 Headache: Principal | ICD-10-CM

## 2017-02-01 DIAGNOSIS — R590 Localized enlarged lymph nodes: Secondary | ICD-10-CM | POA: Insufficient documentation

## 2017-02-01 DIAGNOSIS — G930 Cerebral cysts: Secondary | ICD-10-CM | POA: Insufficient documentation

## 2017-02-01 DIAGNOSIS — C779 Secondary and unspecified malignant neoplasm of lymph node, unspecified: Secondary | ICD-10-CM | POA: Insufficient documentation

## 2017-02-01 DIAGNOSIS — R937 Abnormal findings on diagnostic imaging of other parts of musculoskeletal system: Secondary | ICD-10-CM | POA: Diagnosis not present

## 2017-02-01 DIAGNOSIS — C499 Malignant neoplasm of connective and soft tissue, unspecified: Secondary | ICD-10-CM | POA: Diagnosis not present

## 2017-02-01 DIAGNOSIS — R519 Headache, unspecified: Secondary | ICD-10-CM

## 2017-02-01 DIAGNOSIS — C439 Malignant melanoma of skin, unspecified: Secondary | ICD-10-CM | POA: Insufficient documentation

## 2017-02-01 LAB — GLUCOSE, CAPILLARY: GLUCOSE-CAPILLARY: 77 mg/dL (ref 65–99)

## 2017-02-01 MED ORDER — GADOBENATE DIMEGLUMINE 529 MG/ML IV SOLN
10.0000 mL | Freq: Once | INTRAVENOUS | Status: AC | PRN
  Administered 2017-02-01: 7 mL via INTRAVENOUS

## 2017-02-01 MED ORDER — PREDNISONE 10 MG (21) PO TBPK: ORAL_TABLET | ORAL | 0 refills | Status: DC

## 2017-02-01 MED ORDER — FLUDEOXYGLUCOSE F - 18 (FDG) INJECTION
12.0000 | Freq: Once | INTRAVENOUS | Status: AC | PRN
  Administered 2017-02-01: 11.8 via INTRAVENOUS

## 2017-02-01 NOTE — Telephone Encounter (Signed)
Patient requesting to stop the Mekinist today reporting that she has a rash in areas she does not want to think about having it in and it itches really bad Please advise

## 2017-02-01 NOTE — Telephone Encounter (Signed)
That's fine.  Thank you.

## 2017-02-01 NOTE — Telephone Encounter (Signed)
Left message on patient voice mail ok to stop Mekinist

## 2017-02-04 ENCOUNTER — Encounter: Payer: Self-pay | Admitting: Oncology

## 2017-02-04 DIAGNOSIS — L299 Pruritus, unspecified: Secondary | ICD-10-CM | POA: Diagnosis not present

## 2017-02-04 DIAGNOSIS — R21 Rash and other nonspecific skin eruption: Secondary | ICD-10-CM | POA: Diagnosis not present

## 2017-02-06 ENCOUNTER — Inpatient Hospital Stay: Payer: Medicare Other | Admitting: Oncology

## 2017-02-06 ENCOUNTER — Inpatient Hospital Stay: Payer: Medicare Other

## 2017-02-08 NOTE — Progress Notes (Signed)
[place]  [phone] [phone]  ID: [name] OB: October 18, 1960  MR#: [id number]  AOZ#:[id number]  Patient Care Team: Patient, No Pcp Per as PCP - General (General Practice) Bary Castilla, Forest Gleason, MD as Consulting Physician (General Surgery) Forest Gleason, MD (Oncology) Lloyd Huger, MD as Consulting Physician (Oncology)  CHIEF COMPLAINT: Recurrent metastatic melanoma, BRAF+, of right lower extremity, metastatic.  INTERVAL HISTORY: Patient returns to clinic today for further evaluation and discussion of her PET scan results.  She continues to have significant rash and itching despite discontinuing Mekinist last week.  She otherwise feels well.  She has no neurologic complaints. She denies any recent fevers or illnesses. She has a good appetite. She denies any chest pain or shortness of breath. She has no nausea, vomiting, or constipation.  She has no further diarrhea.  She denies any joint pain.  She has no urinary complaints. Patient no specific complaints today  REVIEW OF SYSTEMS:   Review of Systems  Constitutional: Negative for fever, malaise/fatigue and weight loss.  HENT: Negative.  Negative for ear pain and tinnitus.   Eyes: Negative.   Respiratory: Negative for cough and shortness of breath.   Cardiovascular: Negative.  Negative for chest pain and leg swelling.  Gastrointestinal: Negative for abdominal pain, blood in stool, diarrhea, heartburn and melena.  Genitourinary: Negative.   Musculoskeletal: Positive for back pain. Negative for joint pain.  Skin: Positive for itching and rash.  Neurological: Negative for sensory change and weakness.  Endo/Heme/Allergies: Does not bruise/bleed easily.  Psychiatric/Behavioral: Negative.  Negative for depression. The patient is not nervous/anxious and does not have insomnia.     As per HPI. Otherwise, a complete review of systems is negative.  PAST MEDICAL HISTORY: Past Medical  History:  Diagnosis Date  . Adrenal insufficiency ([place])   . Bipolar disorder ([place])   . Cardiomyopathy ([place])   . CHF (congestive heart failure) ([place]) 2016   Dr. Nehemiah Massed  . Depression   . GERD (gastroesophageal reflux disease) 2012  . Hypertension   . Hypothyroidism   . Joint pain   . Lumbar herniated disc 2010  . Malignant melanoma of skin of lower limb, including hip ([place]) 2008, 2017   right thigh  . Osteoporosis, postmenopausal   . Overactive bladder   . Pancreatitis   . Panhypopituitarism ([place])   . Personal history of malignant melanoma of skin 2012  . Rectal bleeding   . Supraglottitis   . Tendonitis   . Tick bite of head 07/20/2015  . Weight loss, unintentional     PAST SURGICAL HISTORY: Past Surgical History:  Procedure Laterality Date  . BACK SURGERY  2010  . BREAST CYST EXCISION  1985  . COLONOSCOPY  07/2012   [facility]  . COLONOSCOPY WITH PROPOFOL N/A 11/21/2016   Procedure: COLONOSCOPY WITH PROPOFOL;  Surgeon: Manya Silvas, MD;  Location: [facility] ENDOSCOPY;  Service: Endoscopy;  Laterality: N/A;  . ESOPHAGOGASTRODUODENOSCOPY    . ESOPHAGOGASTRODUODENOSCOPY (EGD) WITH PROPOFOL N/A 11/21/2016   Procedure: ESOPHAGOGASTRODUODENOSCOPY (EGD) WITH PROPOFOL;  Surgeon: Manya Silvas, MD;  Location: [facility] ENDOSCOPY;  Service: Endoscopy;  Laterality: N/A;  . FRACTURE SURGERY     rod removed from left femur 07/21/2014  . INGUINAL LYMPH NODE BIOPSY Right 07/17/2015   Procedure: INGUINAL LYMPH NODE BIOPSY/EXCISION;  Surgeon: Robert Bellow, MD;  Location: [facility] ORS;  Service: General;  Laterality: Right;  . laminnotomy reexploration posterior lumbar   12/26/2008   with nerve decomp and discectomy  .  MELANOMA EXCISION  2008   right thigh  . PORTACATH PLACEMENT Left 03/14/2016   Procedure: INSERTION PORT-A-CATH;  Surgeon: Robert Bellow, MD;  Location: [facility] ORS;  Service: General;  Laterality: Left;  . SKIN LESION EXCISION Right 12-19-10   posterior  . SPINE SURGERY       FAMILY HISTORY: Family History  Problem Relation Age of Onset  . COPD Father   . Cancer Sister        Colon, sister       ADVANCED DIRECTIVES:    HEALTH MAINTENANCE: Social History   Tobacco Use  . Smoking status: Current Every Day Smoker    Packs/day: 0.50    Years: 20.00    Pack years: 10.00    Types: Cigarettes  . Smokeless tobacco: Never Used  Substance Use Topics  . Alcohol use: Yes    Alcohol/week: 0.0 oz    Comment: ocassionally  . Drug use: No     Colonoscopy:  PAP:  Bone density:  Lipid panel:  Allergies  Allergen Reactions  . Clindamycin Shortness Of Breath  . Levaquin [Levofloxacin] Other (See Comments)    Joint inflammation  . Aripiprazole Other (See Comments)    Facial and head ticks  . Depakote [Divalproex Sodium] Other (See Comments)  . Valproic Acid     Other reaction(s): Other (See Comments) Facial twitching    Current Outpatient Medications  Medication Sig Dispense Refill  . aspirin-acetaminophen-caffeine (EXCEDRIN MIGRAINE) 250-250-65 MG tablet Take 2 tablets by mouth daily as needed for headache.    . calcium carbonate (OS-CAL) 600 MG TABS tablet Take 600 mg by mouth 2 (two) times daily with a meal.    . clonazePAM (KLONOPIN) 0.5 MG tablet Take 0.5 mg by mouth at bedtime.    . cyanocobalamin (,VITAMIN B-12,) 1000 MCG/ML injection INJECT 1ML INTO THE MUSCLE MONTHLY  6  . furosemide (LASIX) 20 MG tablet Take 20 mg by mouth as needed.     . gabapentin (NEURONTIN) 300 MG capsule Take 300 mg by mouth 3 (three) times daily.    Marland Kitchen HYDROcodone-acetaminophen (NORCO/VICODIN) 5-325 MG tablet Take 1 tablet by mouth every 4 (four) hours as needed for moderate pain. 60 tablet 0  . hydrocortisone (CORTEF) 10 MG tablet Take 10-15 mg by mouth 2 (two) times daily. 1.5 tablets (15 MG) in the morning and 1 tablet (10 MG) in the evening    . levothyroxine (SYNTHROID, LEVOTHROID) 112 MCG tablet Take 112 mcg by mouth daily before breakfast.     .  lipase/protease/amylase (CREON) 12000 units CPEP capsule Take 12,000 Units by mouth 3 (three) times daily with meals.    . Metoclopramide HCl (REGLAN PO) Take by mouth.    . metoprolol tartrate (LOPRESSOR) 25 MG tablet Take 12.5 mg by mouth 2 (two) times daily.     . Multiple Vitamin (MULTIVITAMIN WITH MINERALS) TABS tablet Take 1 tablet by mouth daily.    . ondansetron (ZOFRAN) 4 MG tablet Take 4 mg by mouth every 8 (eight) hours as needed for nausea or vomiting.    . pantoprazole (PROTONIX) 40 MG tablet Take 1 tablet (40 mg total) by mouth 2 (two) times daily. 60 tablet 3  . predniSONE (DELTASONE) 10 MG tablet TAKE 1 TABLET (10 MG TOTAL) BY MOUTH DAILY WITH BREAKFAST. 30 tablet 0  . predniSONE (STERAPRED UNI-PAK 21 TAB) 10 MG (21) TBPK tablet Taper as directed 21 tablet 0  . prochlorperazine (COMPAZINE) 10 MG tablet Take 1 tablet (10 mg total) by  mouth every 6 (six) hours as needed for nausea or vomiting. 60 tablet 1  . venlafaxine XR (EFFEXOR-XR) 150 MG 24 hr capsule 150 mg every morning.     . venlafaxine XR (EFFEXOR-XR) 75 MG 24 hr capsule Take 75 mg by mouth every evening.    . nystatin (MYCOSTATIN) 100000 UNIT/ML suspension Take 5 mLs (500,000 Units total) by mouth 4 (four) times daily. (Patient not taking: Reported on 01/10/2017) 120 mL 1  . trametinib dimethyl sulfoxide (MEKINIST) 2 MG tablet Take 1 tablet (2 mg total) by mouth daily. Take 1 hour before or 2 hours after a meal. Store refrigerated in original container. (Patient not taking: Reported on 02/09/2017) 30 tablet 5   No current facility-administered medications for this visit.     OBJECTIVE: Vitals:   02/09/17 1610  BP: (!) 150/98  Pulse: 70  Resp: 20  Temp: 97.7 F (36.5 C)     Body mass index is 22.87 kg/m.    ECOG FS:0 - Asymptomatic  General: Well-developed, well-nourished, no acute distress. Eyes: Pink conjunctiva, anicteric sclera. Lungs: Clear to auscultation bilaterally. Heart: Regular rate and rhythm. No  rubs, murmurs, or gallops. Abdomen: Soft, nontender, nondistended. No organomegaly noted, normoactive bowel sounds. Musculoskeletal: No edema, cyanosis, or clubbing.  Neuro: Alert, answering all questions appropriately. Cranial nerves grossly intact. Skin: Rash noted on arms and torso. Psych: Normal affect.   LAB RESULTS:  Lab Results  Component Value Date   NA 136 02/09/2017   K 3.8 02/09/2017   CL 100 (L) 02/09/2017   CO2 28 02/09/2017   GLUCOSE 170 (H) 02/09/2017   BUN 24 (H) 02/09/2017   CREATININE 1.86 (H) 02/09/2017   CALCIUM 8.4 (L) 02/09/2017   PROT 5.8 (L) 02/09/2017   ALBUMIN 3.5 02/09/2017   AST 49 (H) 02/09/2017   ALT 33 02/09/2017   ALKPHOS 69 02/09/2017   BILITOT 0.5 02/09/2017   GFRNONAA 29 (L) 02/09/2017   GFRAA 34 (L) 02/09/2017    Lab Results  Component Value Date   WBC 9.1 02/09/2017   NEUTROABS 7.4 (H) 02/09/2017   HGB 10.8 (L) 02/09/2017   HCT 32.0 (L) 02/09/2017   MCV 97.8 02/09/2017   PLT 287 02/09/2017    STUDIES: Mr Jeri Cos NI Contrast  Result Date: 02/01/2017 CLINICAL DATA:  56 year old female with for recurrent metastatic melanoma. Headaches, decreased hearing, shakiness symptoms for 2 months. EXAM: MRI HEAD WITHOUT AND WITH CONTRAST TECHNIQUE: Multiplanar, multiecho pulse sequences of the brain and surrounding structures were obtained without and with intravenous contrast. CONTRAST:  15m MULTIHANCE GADOBENATE DIMEGLUMINE 529 MG/ML IV SOLN COMPARISON:  Brain MRI 04/21/2014 FINDINGS: Brain: No abnormal enhancement identified. No dural thickening. No midline shift or acute intracranial mass effect. Small right middle cranial fossa arachnoid cyst at the anterior temporal tip suspected and stable (series 9, image 9). No associated enhancement. No other extra-axial collection. GPearline Cablesand white matter signal throughout the brain is stable since 2016 and within normal limits for age. No cortical encephalomalacia. No chronic cerebral blood products. No  restricted diffusion to suggest acute infarction. No ventriculomegaly or acute intracranial hemorrhage. Cervicomedullary junction and pituitary are within normal limits. Vascular: Major intracranial vascular flow voids are stable. Skull and upper cervical spine: Negative visualized cervical spine and spinal cord. Skull bone marrow signal appears stable. No suspicious osseous lesion. Sinuses/Orbits: Orbits soft tissues remain normal. Visualized paranasal sinuses and mastoids are stable and well pneumatized. Other: Visible internal auditory structures appear stable and within normal limits. Visible scalp  and face soft tissues appear negative. IMPRESSION: 1.  No metastatic disease or acute intracranial abnormality. 2. MRI appearance of the brain is stable and within normal limits for age; small and inconsequential right middle cranial fossa arachnoid cyst. Electronically Signed   By: Genevie Ann M.D.   On: 02/01/2017 13:19   Nm Pet Image Restage (ps) Whole Body  Result Date: 02/01/2017 CLINICAL DATA:  Subsequent treatment strategy for malignant melanoma metastatic to lymph nodes. No recent surgery or adjuvant therapy. EXAM: NUCLEAR MEDICINE PET WHOLE BODY TECHNIQUE: 11.8 mCi F-18 FDG was injected intravenously. Full-ring PET imaging was performed from the vertex to the feet after the radiotracer. CT data was obtained and used for attenuation correction and anatomic localization. FASTING BLOOD GLUCOSE:  Value: 77 mg/dl COMPARISON:  PET-CT 10/06/2016 and 07/04/2016. FINDINGS: HEAD/NECK No hypermetabolic cervical lymph nodes are identified.There are no lesions of the pharyngeal mucosal space. The visualized intracranial contents are unremarkable. CHEST There are no hypermetabolic mediastinal, hilar or axillary lymph nodes. Small mediastinal lymph nodes are stable. There is no hypermetabolic pulmonary activity. The lungs are clear. Left subclavian Port-A-Cath extends to the upper right atrium. ABDOMEN/PELVIS There is no  hypermetabolic activity within the liver, adrenal glands, spleen or pancreas. The pancreas is diffusely atrophied, but without residual hypermetabolic activity. There are no hypermetabolic abdominopelvic lymph nodes. There is a stable enlarged right external iliac node measuring 1.5 cm on image 258. No progressive adenopathy demonstrated. Mild renal cortical thinning noted bilaterally. SKELETON There is no hypermetabolic activity to suggest osseous metastatic disease. There is slightly improved sclerosis in the left sacral ala without residual hypermetabolic activity. EXTREMITIES There is multifocal muscular activity, especially in the forearms and medial left thigh. This is likely physiologic. There is no suspicious osseous activity to suggest metastatic disease. Postsurgical defect in the left femur from previous intramedullary rod again noted. IMPRESSION: 1. There is no hypermetabolic activity to suggest residual or recurrent disease. 2. Stable enlarged right external iliac lymph node without hypermetabolic activity. 3. Resolution of hypermetabolic pancreatic activity consistent with resolved pancreatitis. 4. Resolved hypermetabolic activity within the left sacral ala. This could reflect a treated osseous lesion or healed insufficiency fracture. Electronically Signed   By: Richardean Sale M.D.   On: 02/01/2017 12:34    ONCOLOGIC HISTORY: Patient initially diagnosed with metastatic melanoma to right thigh with unknown primary in 2009. She received 4 weeks of high-dose interferon therapy in February 2009 and proceed with low-dose interferon therapy in March 2009. Patient remained in remission until August 2012 when she was noted to have recurrent melanoma and a pelvic lymph node. October 2012 she had 2 cycles of Ipilumimab which was discontinued secondary to poor tolerance. In April 2017 PET scan showed recurrent disease in the right inguinal region which was confirmed by biopsy. Zeboraf discontinued in November  2017 secondary to intolerable rash. Patient initiated combination immunotherapy with nivolumab and ipilumimab on January 14, 2016. Patient initiated maintenance nivolumab on April 14, 2016 and discontinued on November 07, 2016. Single agent oral Mekinist was initiated on December 07, 2016.  ASSESSMENT: Recurrent metastatic melanoma, BRAF+, of right lower extremity, metastatic.  PLAN:    1. Recurrent metastatic melanoma, BRAF+, of right lower extremity, metastatic:  PET scan and MRI of brain results from February 01, 2017 reviewed independently and reported as above with complete metabolic response of patient's metastatic disease.  She completed palliative XRT to her iliac crest lesion on August 11, 2016. Because of patient's persistent grade 3 diarrhea will not  pursue any additional treatments with nivolumab.  She discontinued 2 mg trametinib (Mekinist) daily approximately 1 week ago secondary to rash.  After discussion with the patient, given her complete metabolic response, she does not wish to continue treatment at this time.  She expressed understanding she is at risk for recurrence.  If this occurs, will likely reinitiate Mekinist. Can also consider combination therapy using dabrafenib/trametinib in the future. Single agent Cobimetinib is also an option. Return to clinic in 6 weeks with repeat laboratory work and further evaluation.  Will reimage with PET scan in 3 months.  2. Pain: Resolved. Patient states she has discontinued Plaquenil.  She no longer takes hydrocodone.   3. Nausea: Resolved.  Continue Compazine as needed. 4. Diarrhea: Resolved with Remicade. At least grade 3 and refractory to steroids. Colonoscopy and EGD were unrevealing. 5. Pancreatitis: Resolved.  Unclear etiology. Continue follow-up with GI as indicated. 6. Elevated creatinine: Patient noted to have increasing creatinine over the past month. She states her primary care physician has already sent a referral to nephrology. 7.   Rash: Unresponsive to steroid taper and topical treatments.  Possibly related to Mekinist discontinued.  If it does not resolve in the next several weeks, will give patient referral to dermatology.  Approximately 30 minutes was spent in discussion of which greater than 50% was consultation.  Patient expressed understanding and was in agreement with this plan. She also understands that She can call clinic at any time with any questions, concerns, or complaints.    Lloyd Huger, MD 02/12/17 1:11 PM

## 2017-02-09 ENCOUNTER — Inpatient Hospital Stay: Payer: Medicare Other | Attending: Oncology | Admitting: Oncology

## 2017-02-09 ENCOUNTER — Inpatient Hospital Stay: Payer: Medicare Other | Admitting: *Deleted

## 2017-02-09 ENCOUNTER — Encounter: Payer: Self-pay | Admitting: Oncology

## 2017-02-09 ENCOUNTER — Other Ambulatory Visit: Payer: Self-pay

## 2017-02-09 VITALS — BP 150/98 | HR 70 | Temp 97.7°F | Resp 20 | Wt 146.0 lb

## 2017-02-09 DIAGNOSIS — C439 Malignant melanoma of skin, unspecified: Secondary | ICD-10-CM

## 2017-02-09 DIAGNOSIS — M549 Dorsalgia, unspecified: Secondary | ICD-10-CM | POA: Diagnosis not present

## 2017-02-09 DIAGNOSIS — G930 Cerebral cysts: Secondary | ICD-10-CM | POA: Diagnosis not present

## 2017-02-09 DIAGNOSIS — C799 Secondary malignant neoplasm of unspecified site: Secondary | ICD-10-CM | POA: Insufficient documentation

## 2017-02-09 DIAGNOSIS — M810 Age-related osteoporosis without current pathological fracture: Secondary | ICD-10-CM

## 2017-02-09 DIAGNOSIS — M2550 Pain in unspecified joint: Secondary | ICD-10-CM | POA: Diagnosis not present

## 2017-02-09 DIAGNOSIS — E230 Hypopituitarism: Secondary | ICD-10-CM

## 2017-02-09 DIAGNOSIS — R21 Rash and other nonspecific skin eruption: Secondary | ICD-10-CM

## 2017-02-09 DIAGNOSIS — Z79899 Other long term (current) drug therapy: Secondary | ICD-10-CM

## 2017-02-09 DIAGNOSIS — N3281 Overactive bladder: Secondary | ICD-10-CM | POA: Diagnosis not present

## 2017-02-09 DIAGNOSIS — R944 Abnormal results of kidney function studies: Secondary | ICD-10-CM

## 2017-02-09 DIAGNOSIS — F17210 Nicotine dependence, cigarettes, uncomplicated: Secondary | ICD-10-CM

## 2017-02-09 DIAGNOSIS — E2740 Unspecified adrenocortical insufficiency: Secondary | ICD-10-CM

## 2017-02-09 DIAGNOSIS — C779 Secondary and unspecified malignant neoplasm of lymph node, unspecified: Secondary | ICD-10-CM | POA: Diagnosis not present

## 2017-02-09 DIAGNOSIS — R634 Abnormal weight loss: Secondary | ICD-10-CM | POA: Diagnosis not present

## 2017-02-09 DIAGNOSIS — I429 Cardiomyopathy, unspecified: Secondary | ICD-10-CM

## 2017-02-09 DIAGNOSIS — M5126 Other intervertebral disc displacement, lumbar region: Secondary | ICD-10-CM

## 2017-02-09 DIAGNOSIS — E039 Hypothyroidism, unspecified: Secondary | ICD-10-CM

## 2017-02-09 DIAGNOSIS — K219 Gastro-esophageal reflux disease without esophagitis: Secondary | ICD-10-CM | POA: Diagnosis not present

## 2017-02-09 DIAGNOSIS — Z9221 Personal history of antineoplastic chemotherapy: Secondary | ICD-10-CM | POA: Diagnosis not present

## 2017-02-09 DIAGNOSIS — R197 Diarrhea, unspecified: Secondary | ICD-10-CM | POA: Diagnosis not present

## 2017-02-09 DIAGNOSIS — I509 Heart failure, unspecified: Secondary | ICD-10-CM

## 2017-02-09 DIAGNOSIS — R590 Localized enlarged lymph nodes: Secondary | ICD-10-CM

## 2017-02-09 DIAGNOSIS — E038 Other specified hypothyroidism: Secondary | ICD-10-CM | POA: Diagnosis not present

## 2017-02-09 DIAGNOSIS — Z800 Family history of malignant neoplasm of digestive organs: Secondary | ICD-10-CM

## 2017-02-09 DIAGNOSIS — T7840XA Allergy, unspecified, initial encounter: Secondary | ICD-10-CM | POA: Insufficient documentation

## 2017-02-09 DIAGNOSIS — Z923 Personal history of irradiation: Secondary | ICD-10-CM | POA: Diagnosis not present

## 2017-02-09 DIAGNOSIS — Z95828 Presence of other vascular implants and grafts: Secondary | ICD-10-CM

## 2017-02-09 DIAGNOSIS — F319 Bipolar disorder, unspecified: Secondary | ICD-10-CM | POA: Diagnosis not present

## 2017-02-09 DIAGNOSIS — G609 Hereditary and idiopathic neuropathy, unspecified: Secondary | ICD-10-CM | POA: Diagnosis not present

## 2017-02-09 LAB — CBC WITH DIFFERENTIAL/PLATELET
BASOS ABS: 0 10*3/uL (ref 0–0.1)
BASOS PCT: 0 %
EOS PCT: 0 %
Eosinophils Absolute: 0 10*3/uL (ref 0–0.7)
HEMATOCRIT: 32 % — AB (ref 35.0–47.0)
Hemoglobin: 10.8 g/dL — ABNORMAL LOW (ref 12.0–16.0)
LYMPHS PCT: 13 %
Lymphs Abs: 1.2 10*3/uL (ref 1.0–3.6)
MCH: 33 pg (ref 26.0–34.0)
MCHC: 33.7 g/dL (ref 32.0–36.0)
MCV: 97.8 fL (ref 80.0–100.0)
MONO ABS: 0.5 10*3/uL (ref 0.2–0.9)
Monocytes Relative: 6 %
NEUTROS ABS: 7.4 10*3/uL — AB (ref 1.4–6.5)
Neutrophils Relative %: 81 %
PLATELETS: 287 10*3/uL (ref 150–440)
RBC: 3.28 MIL/uL — AB (ref 3.80–5.20)
RDW: 15.4 % — AB (ref 11.5–14.5)
WBC: 9.1 10*3/uL (ref 3.6–11.0)

## 2017-02-09 LAB — COMPREHENSIVE METABOLIC PANEL
ALK PHOS: 69 U/L (ref 38–126)
ALT: 33 U/L (ref 14–54)
ANION GAP: 8 (ref 5–15)
AST: 49 U/L — ABNORMAL HIGH (ref 15–41)
Albumin: 3.5 g/dL (ref 3.5–5.0)
BILIRUBIN TOTAL: 0.5 mg/dL (ref 0.3–1.2)
BUN: 24 mg/dL — ABNORMAL HIGH (ref 6–20)
CALCIUM: 8.4 mg/dL — AB (ref 8.9–10.3)
CO2: 28 mmol/L (ref 22–32)
Chloride: 100 mmol/L — ABNORMAL LOW (ref 101–111)
Creatinine, Ser: 1.86 mg/dL — ABNORMAL HIGH (ref 0.44–1.00)
GFR calc Af Amer: 34 mL/min — ABNORMAL LOW (ref >60)
GFR, EST NON AFRICAN AMERICAN: 29 mL/min — AB (ref >60)
GLUCOSE: 170 mg/dL — AB (ref 65–99)
POTASSIUM: 3.8 mmol/L (ref 3.5–5.1)
Sodium: 136 mmol/L (ref 135–145)
TOTAL PROTEIN: 5.8 g/dL — AB (ref 6.5–8.1)

## 2017-02-09 LAB — MAGNESIUM: Magnesium: 2.2 mg/dL (ref 1.7–2.4)

## 2017-02-09 LAB — PHOSPHORUS: PHOSPHORUS: 2.6 mg/dL (ref 2.5–4.6)

## 2017-02-09 MED ORDER — HEPARIN SOD (PORK) LOCK FLUSH 100 UNIT/ML IV SOLN
500.0000 [IU] | INTRAVENOUS | Status: AC | PRN
  Administered 2017-02-09: 500 [IU]

## 2017-02-09 MED ORDER — SODIUM CHLORIDE 0.9% FLUSH
10.0000 mL | INTRAVENOUS | Status: AC | PRN
  Administered 2017-02-09: 10 mL
  Filled 2017-02-09: qty 10

## 2017-02-09 NOTE — Progress Notes (Signed)
Patient reports continued rash and itching, reports stopping Mekinist last week.

## 2017-02-13 ENCOUNTER — Encounter: Payer: Self-pay | Admitting: Oncology

## 2017-02-13 DIAGNOSIS — F17200 Nicotine dependence, unspecified, uncomplicated: Secondary | ICD-10-CM | POA: Diagnosis not present

## 2017-02-13 DIAGNOSIS — E038 Other specified hypothyroidism: Secondary | ICD-10-CM | POA: Diagnosis not present

## 2017-02-13 DIAGNOSIS — E230 Hypopituitarism: Secondary | ICD-10-CM | POA: Diagnosis not present

## 2017-02-13 DIAGNOSIS — E2749 Other adrenocortical insufficiency: Secondary | ICD-10-CM | POA: Diagnosis not present

## 2017-02-14 ENCOUNTER — Encounter: Payer: Self-pay | Admitting: Oncology

## 2017-02-14 ENCOUNTER — Other Ambulatory Visit: Payer: Self-pay

## 2017-02-15 DIAGNOSIS — L509 Urticaria, unspecified: Secondary | ICD-10-CM | POA: Diagnosis not present

## 2017-02-15 DIAGNOSIS — L309 Dermatitis, unspecified: Secondary | ICD-10-CM | POA: Diagnosis not present

## 2017-02-15 DIAGNOSIS — Z85820 Personal history of malignant melanoma of skin: Secondary | ICD-10-CM | POA: Diagnosis not present

## 2017-02-15 DIAGNOSIS — L299 Pruritus, unspecified: Secondary | ICD-10-CM | POA: Diagnosis not present

## 2017-02-15 DIAGNOSIS — R233 Spontaneous ecchymoses: Secondary | ICD-10-CM | POA: Diagnosis not present

## 2017-02-16 DIAGNOSIS — N179 Acute kidney failure, unspecified: Secondary | ICD-10-CM | POA: Diagnosis not present

## 2017-02-16 DIAGNOSIS — N183 Chronic kidney disease, stage 3 (moderate): Secondary | ICD-10-CM | POA: Diagnosis not present

## 2017-02-16 DIAGNOSIS — N2581 Secondary hyperparathyroidism of renal origin: Secondary | ICD-10-CM | POA: Diagnosis not present

## 2017-03-20 NOTE — Progress Notes (Signed)
[place]  Telephone:([phone] [phone]  ID: Patricia Li OB: September 11, 1960  MR#: [id number]  GYK#:[id number]  Patient Care Team: Patient, No Pcp Per as PCP - General (General Practice) Bary Castilla, Forest Gleason, MD as Consulting Physician (General Surgery) Forest Gleason, MD (Oncology) Lloyd Huger, MD as Consulting Physician (Oncology)  CHIEF COMPLAINT: Recurrent metastatic melanoma, BRAF+, of right lower extremity, metastatic.  INTERVAL HISTORY: Patient returns to clinic today for laboratory work and further evaluation.  She currently feels well and is asymptomatic.  She has no further rash. She has no neurologic complaints. She denies any recent fevers or illnesses. She has a good appetite. She denies any chest pain or shortness of breath. She has no nausea, vomiting, diarrhea, or constipation. She denies any joint pain.  She has no urinary complaints. Patient offers no specific complaints today.  REVIEW OF SYSTEMS:   Review of Systems  Constitutional: Negative for fever, malaise/fatigue and weight loss.  HENT: Negative.  Negative for ear pain and tinnitus.   Eyes: Negative.   Respiratory: Negative for cough and shortness of breath.   Cardiovascular: Positive for leg swelling. Negative for chest pain.  Gastrointestinal: Negative for abdominal pain, blood in stool, diarrhea, heartburn and melena.  Genitourinary: Negative.   Musculoskeletal: Negative.  Negative for back pain and joint pain.  Skin: Negative.  Negative for itching and rash.  Neurological: Negative.  Negative for sensory change and weakness.  Endo/Heme/Allergies: Does not bruise/bleed easily.  Psychiatric/Behavioral: Negative.  Negative for depression. The patient is not nervous/anxious and does not have insomnia.     As per HPI. Otherwise, a complete review of systems is negative.  PAST MEDICAL HISTORY: Past Medical History:  Diagnosis Date  . Adrenal insufficiency ([place])   .  Bipolar disorder ([place])   . Cardiomyopathy ([place])   . CHF (congestive heart failure) ([place]) 2016   Dr. Nehemiah Massed  . Depression   . GERD (gastroesophageal reflux disease) 2012  . Hypertension   . Hypothyroidism   . Joint pain   . Lumbar herniated disc 2010  . Malignant melanoma of skin of lower limb, including hip ([place]) 2008, 2017   right thigh  . Osteoporosis, postmenopausal   . Overactive bladder   . Pancreatitis   . Panhypopituitarism ([place])   . Personal history of malignant melanoma of skin 2012  . Rectal bleeding   . Supraglottitis   . Tendonitis   . Tick bite of head 07/20/2015  . Weight loss, unintentional     PAST SURGICAL HISTORY: Past Surgical History:  Procedure Laterality Date  . BACK SURGERY  2010  . BREAST CYST EXCISION  1985  . COLONOSCOPY  07/2012   [facility]  . COLONOSCOPY WITH PROPOFOL N/A 11/21/2016   Procedure: COLONOSCOPY WITH PROPOFOL;  Surgeon: Manya Silvas, MD;  Location: [facility] ENDOSCOPY;  Service: Endoscopy;  Laterality: N/A;  . ESOPHAGOGASTRODUODENOSCOPY    . ESOPHAGOGASTRODUODENOSCOPY (EGD) WITH PROPOFOL N/A 11/21/2016   Procedure: ESOPHAGOGASTRODUODENOSCOPY (EGD) WITH PROPOFOL;  Surgeon: Manya Silvas, MD;  Location: [facility] ENDOSCOPY;  Service: Endoscopy;  Laterality: N/A;  . FRACTURE SURGERY     rod removed from left femur 07/21/2014  . INGUINAL LYMPH NODE BIOPSY Right 07/17/2015   Procedure: INGUINAL LYMPH NODE BIOPSY/EXCISION;  Surgeon: Robert Bellow, MD;  Location: [facility] ORS;  Service: General;  Laterality: Right;  . laminnotomy reexploration posterior lumbar   12/26/2008   with nerve decomp and discectomy  . MELANOMA EXCISION  2008   right thigh  .  PORTACATH PLACEMENT Left 03/14/2016   Procedure: INSERTION PORT-A-CATH;  Surgeon: Robert Bellow, MD;  Location: [facility] ORS;  Service: General;  Laterality: Left;  . SKIN LESION EXCISION Right 12-19-10   posterior  . SPINE SURGERY      FAMILY HISTORY: Family History  Problem Relation Age of  Onset  . COPD Father   . Cancer Sister        Colon, sister       ADVANCED DIRECTIVES:    HEALTH MAINTENANCE: Social History   Tobacco Use  . Smoking status: Current Every Day Smoker    Packs/day: 0.50    Years: 20.00    Pack years: 10.00    Types: Cigarettes  . Smokeless tobacco: Never Used  Substance Use Topics  . Alcohol use: Yes    Alcohol/week: 0.0 oz    Comment: ocassionally  . Drug use: No     Colonoscopy:  PAP:  Bone density:  Lipid panel:  Allergies  Allergen Reactions  . Clindamycin Shortness Of Breath  . Levaquin [Levofloxacin] Other (See Comments)    Joint inflammation  . Aripiprazole Other (See Comments)    Facial and head ticks  . Depakote [Divalproex Sodium] Other (See Comments)  . Valproic Acid     Other reaction(s): Other (See Comments) Facial twitching    Current Outpatient Medications  Medication Sig Dispense Refill  . aspirin-acetaminophen-caffeine (EXCEDRIN MIGRAINE) 250-250-65 MG tablet Take 2 tablets by mouth daily as needed for headache.    . calcium carbonate (OS-CAL) 600 MG TABS tablet Take 600 mg by mouth 2 (two) times daily with a meal.    . clonazePAM (KLONOPIN) 0.5 MG tablet Take 0.5 mg by mouth at bedtime.    . cyanocobalamin (,VITAMIN B-12,) 1000 MCG/ML injection INJECT 1ML INTO THE MUSCLE MONTHLY  6  . furosemide (LASIX) 20 MG tablet Take 20 mg by mouth as needed.     . gabapentin (NEURONTIN) 300 MG capsule Take 300 mg by mouth 3 (three) times daily.    . hydrocortisone (CORTEF) 10 MG tablet Take 10-15 mg by mouth 2 (two) times daily. 1.5 tablets (15 MG) in the morning and 1 tablet (10 MG) in the evening    . levothyroxine (SYNTHROID, LEVOTHROID) 112 MCG tablet Take 112 mcg by mouth daily before breakfast.     . Metoclopramide HCl (REGLAN PO) Take by mouth.    . metoprolol tartrate (LOPRESSOR) 25 MG tablet Take 12.5 mg by mouth 2 (two) times daily.     . Multiple Vitamin (MULTIVITAMIN WITH MINERALS) TABS tablet Take 1 tablet by  mouth daily.    . ondansetron (ZOFRAN) 4 MG tablet Take 4 mg by mouth every 8 (eight) hours as needed for nausea or vomiting.    . pantoprazole (PROTONIX) 40 MG tablet Take 1 tablet (40 mg total) by mouth 2 (two) times daily. 60 tablet 3  . prochlorperazine (COMPAZINE) 10 MG tablet Take 1 tablet (10 mg total) by mouth every 6 (six) hours as needed for nausea or vomiting. 60 tablet 1  . venlafaxine XR (EFFEXOR-XR) 150 MG 24 hr capsule 150 mg every morning.     . venlafaxine XR (EFFEXOR-XR) 75 MG 24 hr capsule Take 75 mg by mouth every evening.    Marland Kitchen HYDROcodone-acetaminophen (NORCO/VICODIN) 5-325 MG tablet Take 1 tablet by mouth every 4 (four) hours as needed for moderate pain. (Patient not taking: Reported on 03/23/2017) 60 tablet 0  . lipase/protease/amylase (CREON) 12000 units CPEP capsule Take 12,000 Units by mouth 3 (  three) times daily with meals.    . nystatin (MYCOSTATIN) 100000 UNIT/ML suspension Take 5 mLs (500,000 Units total) by mouth 4 (four) times daily. (Patient not taking: Reported on 01/10/2017) 120 mL 1  . predniSONE (DELTASONE) 10 MG tablet TAKE 1 TABLET (10 MG TOTAL) BY MOUTH DAILY WITH BREAKFAST. (Patient not taking: Reported on 03/23/2017) 30 tablet 0  . predniSONE (STERAPRED UNI-PAK 21 TAB) 10 MG (21) TBPK tablet Taper as directed (Patient not taking: Reported on 03/23/2017) 21 tablet 0  . trametinib dimethyl sulfoxide (MEKINIST) 2 MG tablet Take 1 tablet (2 mg total) by mouth daily. Take 1 hour before or 2 hours after a meal. Store refrigerated in original container. (Patient not taking: Reported on 02/09/2017) 30 tablet 5   No current facility-administered medications for this visit.     OBJECTIVE: Vitals:   03/23/17 1119  BP: (!) 148/95  Pulse: 62  Resp: 18  Temp: (!) 96.5 F (35.8 C)     Body mass index is 22.65 kg/m.    ECOG FS:0 - Asymptomatic  General: Well-developed, well-nourished, no acute distress. Eyes: Pink conjunctiva, anicteric sclera. Lungs: Clear to  auscultation bilaterally. Heart: Regular rate and rhythm. No rubs, murmurs, or gallops. Abdomen: Soft, nontender, nondistended. No organomegaly noted, normoactive bowel sounds. Musculoskeletal: No edema, cyanosis, or clubbing.  Neuro: Alert, answering all questions appropriately. Cranial nerves grossly intact. Skin: Rash noted on arms and torso. Psych: Normal affect.   LAB RESULTS:  Lab Results  Component Value Date   NA 135 03/23/2017   K 4.0 03/23/2017   CL 99 (L) 03/23/2017   CO2 27 03/23/2017   GLUCOSE 100 (H) 03/23/2017   BUN 18 03/23/2017   CREATININE 1.85 (H) 03/23/2017   CALCIUM 8.8 (L) 03/23/2017   PROT 6.2 (L) 03/23/2017   ALBUMIN 3.9 03/23/2017   AST 28 03/23/2017   ALT 23 03/23/2017   ALKPHOS 62 03/23/2017   BILITOT 0.4 03/23/2017   GFRNONAA 30 (L) 03/23/2017   GFRAA 34 (L) 03/23/2017    Lab Results  Component Value Date   WBC 7.1 03/23/2017   NEUTROABS 5.5 03/23/2017   HGB 11.8 (L) 03/23/2017   HCT 34.6 (L) 03/23/2017   MCV 99.7 03/23/2017   PLT 300 03/23/2017    STUDIES: No results found.  ONCOLOGIC HISTORY: Patient initially diagnosed with metastatic melanoma to right thigh with unknown primary in 2009. She received 4 weeks of high-dose interferon therapy in February 2009 and proceed with low-dose interferon therapy in March 2009. Patient remained in remission until August 2012 when she was noted to have recurrent melanoma and a pelvic lymph node. October 2012 she had 2 cycles of Ipilumimab which was discontinued secondary to poor tolerance. In April 2017 PET scan showed recurrent disease in the right inguinal region which was confirmed by biopsy. Zeboraf discontinued in November 2017 secondary to intolerable rash. Patient initiated combination immunotherapy with nivolumab and ipilumimab on January 14, 2016. Patient initiated maintenance nivolumab on April 14, 2016 and discontinued on November 07, 2016. Single agent oral Mekinist from December 07, 2016  through December 2018.  ASSESSMENT: Recurrent metastatic melanoma, BRAF+, of right lower extremity, metastatic.  PLAN:    1. Recurrent metastatic melanoma, BRAF+, of right lower extremity, metastatic:  PET scan and MRI of brain results from February 01, 2017 reviewed independently with complete metabolic response of patient's metastatic disease.  She completed palliative XRT to her iliac crest lesion on August 11, 2016. Because of patient's persistent grade 3 diarrhea will not  pursue any additional treatments with nivolumab.  She discontinued 2 mg trametinib (Mekinist) daily secondary to rash.  We will continue to hold treatment at this time. She expressed understanding she is at risk for recurrence.  If this occurs, will likely reinitiate Mekinist. Can also consider combination therapy using dabrafenib/trametinib in the future. Single agent Cobimetinib is also an option. Return to clinic in 6 weeks with repeat laboratory work and further evaluation.  Return to clinic in 6 weeks with repeat PET scan and further evaluation.   2. Pain: Resolved. Patient states she has discontinued Plaquenil.  She no longer takes hydrocodone.   3. Nausea: Resolved.  Continue Compazine as needed. 4. Diarrhea: Resolved with Remicade. At least grade 3 and refractory to steroids. Colonoscopy and EGD were unrevealing. 5. Pancreatitis: Resolved.  Unclear etiology. Continue follow-up with GI as indicated. 6. Elevated creatinine: Patient's creatinine continues to be mildly elevated.  Continue monitoring and treatment per nephrology.   7.  Rash: Likely secondary to Mekinist.  Unresponsive to steroid taper and topical treatments.     Patient expressed understanding and was in agreement with this plan. She also understands that She can call clinic at any time with any questions, concerns, or complaints.    Lloyd Huger, MD 03/23/17 11:28 AM

## 2017-03-23 ENCOUNTER — Encounter: Payer: Self-pay | Admitting: Oncology

## 2017-03-23 ENCOUNTER — Inpatient Hospital Stay: Payer: 59 | Attending: Oncology

## 2017-03-23 ENCOUNTER — Other Ambulatory Visit: Payer: Self-pay

## 2017-03-23 ENCOUNTER — Inpatient Hospital Stay (HOSPITAL_BASED_OUTPATIENT_CLINIC_OR_DEPARTMENT_OTHER): Payer: 59 | Admitting: Oncology

## 2017-03-23 VITALS — BP 148/95 | HR 62 | Temp 96.5°F | Resp 18 | Wt 144.6 lb

## 2017-03-23 DIAGNOSIS — Z8719 Personal history of other diseases of the digestive system: Secondary | ICD-10-CM

## 2017-03-23 DIAGNOSIS — E230 Hypopituitarism: Secondary | ICD-10-CM | POA: Insufficient documentation

## 2017-03-23 DIAGNOSIS — Z800 Family history of malignant neoplasm of digestive organs: Secondary | ICD-10-CM | POA: Insufficient documentation

## 2017-03-23 DIAGNOSIS — K219 Gastro-esophageal reflux disease without esophagitis: Secondary | ICD-10-CM | POA: Insufficient documentation

## 2017-03-23 DIAGNOSIS — N3281 Overactive bladder: Secondary | ICD-10-CM | POA: Insufficient documentation

## 2017-03-23 DIAGNOSIS — C4371 Malignant melanoma of right lower limb, including hip: Secondary | ICD-10-CM

## 2017-03-23 DIAGNOSIS — E039 Hypothyroidism, unspecified: Secondary | ICD-10-CM

## 2017-03-23 DIAGNOSIS — F329 Major depressive disorder, single episode, unspecified: Secondary | ICD-10-CM

## 2017-03-23 DIAGNOSIS — Z79899 Other long term (current) drug therapy: Secondary | ICD-10-CM

## 2017-03-23 DIAGNOSIS — R21 Rash and other nonspecific skin eruption: Secondary | ICD-10-CM | POA: Insufficient documentation

## 2017-03-23 DIAGNOSIS — M2550 Pain in unspecified joint: Secondary | ICD-10-CM | POA: Diagnosis not present

## 2017-03-23 DIAGNOSIS — C779 Secondary and unspecified malignant neoplasm of lymph node, unspecified: Secondary | ICD-10-CM | POA: Diagnosis not present

## 2017-03-23 DIAGNOSIS — M519 Unspecified thoracic, thoracolumbar and lumbosacral intervertebral disc disorder: Secondary | ICD-10-CM

## 2017-03-23 DIAGNOSIS — C439 Malignant melanoma of skin, unspecified: Secondary | ICD-10-CM

## 2017-03-23 DIAGNOSIS — M810 Age-related osteoporosis without current pathological fracture: Secondary | ICD-10-CM | POA: Insufficient documentation

## 2017-03-23 DIAGNOSIS — I509 Heart failure, unspecified: Secondary | ICD-10-CM | POA: Insufficient documentation

## 2017-03-23 DIAGNOSIS — Z95828 Presence of other vascular implants and grafts: Secondary | ICD-10-CM

## 2017-03-23 DIAGNOSIS — F319 Bipolar disorder, unspecified: Secondary | ICD-10-CM | POA: Insufficient documentation

## 2017-03-23 DIAGNOSIS — E2740 Unspecified adrenocortical insufficiency: Secondary | ICD-10-CM

## 2017-03-23 DIAGNOSIS — N944 Primary dysmenorrhea: Secondary | ICD-10-CM

## 2017-03-23 DIAGNOSIS — F17210 Nicotine dependence, cigarettes, uncomplicated: Secondary | ICD-10-CM

## 2017-03-23 LAB — COMPREHENSIVE METABOLIC PANEL
ALBUMIN: 3.9 g/dL (ref 3.5–5.0)
ALK PHOS: 62 U/L (ref 38–126)
ALT: 23 U/L (ref 14–54)
AST: 28 U/L (ref 15–41)
Anion gap: 9 (ref 5–15)
BUN: 18 mg/dL (ref 6–20)
CALCIUM: 8.8 mg/dL — AB (ref 8.9–10.3)
CO2: 27 mmol/L (ref 22–32)
CREATININE: 1.85 mg/dL — AB (ref 0.44–1.00)
Chloride: 99 mmol/L — ABNORMAL LOW (ref 101–111)
GFR calc Af Amer: 34 mL/min — ABNORMAL LOW (ref >60)
GFR calc non Af Amer: 30 mL/min — ABNORMAL LOW (ref >60)
GLUCOSE: 100 mg/dL — AB (ref 65–99)
Potassium: 4 mmol/L (ref 3.5–5.1)
Sodium: 135 mmol/L (ref 135–145)
Total Bilirubin: 0.4 mg/dL (ref 0.3–1.2)
Total Protein: 6.2 g/dL — ABNORMAL LOW (ref 6.5–8.1)

## 2017-03-23 LAB — CBC WITH DIFFERENTIAL/PLATELET
Basophils Absolute: 0.1 10*3/uL (ref 0–0.1)
Basophils Relative: 2 %
EOS ABS: 0.1 10*3/uL (ref 0–0.7)
Eosinophils Relative: 1 %
HCT: 34.6 % — ABNORMAL LOW (ref 35.0–47.0)
Hemoglobin: 11.8 g/dL — ABNORMAL LOW (ref 12.0–16.0)
Lymphocytes Relative: 14 %
Lymphs Abs: 1 10*3/uL (ref 1.0–3.6)
MCH: 34 pg (ref 26.0–34.0)
MCHC: 34.1 g/dL (ref 32.0–36.0)
MCV: 99.7 fL (ref 80.0–100.0)
MONO ABS: 0.4 10*3/uL (ref 0.2–0.9)
MONOS PCT: 6 %
Neutro Abs: 5.5 10*3/uL (ref 1.4–6.5)
Neutrophils Relative %: 77 %
Platelets: 300 10*3/uL (ref 150–440)
RBC: 3.47 MIL/uL — ABNORMAL LOW (ref 3.80–5.20)
RDW: 15.6 % — AB (ref 11.5–14.5)
WBC: 7.1 10*3/uL (ref 3.6–11.0)

## 2017-03-23 LAB — MAGNESIUM: Magnesium: 2.6 mg/dL — ABNORMAL HIGH (ref 1.7–2.4)

## 2017-03-23 LAB — PHOSPHORUS: Phosphorus: 3.2 mg/dL (ref 2.5–4.6)

## 2017-03-23 MED ORDER — HEPARIN SOD (PORK) LOCK FLUSH 100 UNIT/ML IV SOLN
500.0000 [IU] | INTRAVENOUS | Status: AC | PRN
  Administered 2017-03-23: 500 [IU]

## 2017-03-23 MED ORDER — SODIUM CHLORIDE 0.9% FLUSH
10.0000 mL | INTRAVENOUS | Status: AC | PRN
  Administered 2017-03-23: 10 mL
  Filled 2017-03-23: qty 10

## 2017-03-23 NOTE — Progress Notes (Signed)
Patient denies any concerns today.  

## 2017-04-18 ENCOUNTER — Other Ambulatory Visit: Payer: Self-pay | Admitting: Oncology

## 2017-04-20 DIAGNOSIS — G4733 Obstructive sleep apnea (adult) (pediatric): Secondary | ICD-10-CM | POA: Insufficient documentation

## 2017-05-01 ENCOUNTER — Encounter: Payer: Self-pay | Admitting: Oncology

## 2017-05-02 ENCOUNTER — Encounter: Payer: Self-pay | Admitting: Oncology

## 2017-05-03 ENCOUNTER — Ambulatory Visit
Admission: RE | Admit: 2017-05-03 | Discharge: 2017-05-03 | Disposition: A | Discharge status: Home / Self Care | Payer: Medicare HMO | Source: Ambulatory Visit | Attending: Oncology | Admitting: Oncology

## 2017-05-03 DIAGNOSIS — Z1231 Encounter for screening mammogram for malignant neoplasm of breast: Secondary | ICD-10-CM

## 2017-05-03 HISTORY — DX: Personal history of irradiation: Z92.3

## 2017-05-03 HISTORY — DX: Personal history of antineoplastic chemotherapy: Z92.21

## 2017-05-04 ENCOUNTER — Ambulatory Visit: Payer: 59 | Admitting: Oncology

## 2017-05-07 NOTE — Progress Notes (Signed)
[place]  [phone] [phone]  ID: [name] OB: 07/19/1960  MR#: [id number]  BZJ#:[id number]  Patient Care Team: System, Pcp Not In as PCP - General Byrnett, Forest Gleason, MD as Consulting Physician (General Surgery) Forest Gleason, MD (Oncology) Lloyd Huger, MD as Consulting Physician (Oncology)  CHIEF COMPLAINT: Recurrent metastatic melanoma, BRAF+, of right lower extremity, metastatic.  INTERVAL HISTORY: Patient returns to clinic today for further evaluation and discussion of her PET scan results.  She currently feels well and is asymptomatic.  She has no further rash. She has no neurologic complaints. She denies any recent fevers or illnesses. She has a good appetite. She denies any chest pain or shortness of breath. She has no nausea, vomiting, diarrhea, or constipation. She denies any joint pain.  She has no urinary complaints. Patient offers no specific complaints today.  REVIEW OF SYSTEMS:   Review of Systems  Constitutional: Negative for fever, malaise/fatigue and weight loss.  HENT: Negative.  Negative for ear pain and tinnitus.   Eyes: Negative.   Respiratory: Negative for cough and shortness of breath.   Cardiovascular: Negative.  Negative for chest pain and leg swelling.  Gastrointestinal: Negative for abdominal pain, blood in stool, diarrhea, heartburn and melena.  Genitourinary: Negative.   Musculoskeletal: Negative.  Negative for back pain and joint pain.  Skin: Negative.  Negative for itching and rash.  Neurological: Negative.  Negative for sensory change and weakness.  Endo/Heme/Allergies: Does not bruise/bleed easily.  Psychiatric/Behavioral: Negative.  Negative for depression. The patient is not nervous/anxious and does not have insomnia.     As per HPI. Otherwise, a complete review of systems is negative.  PAST MEDICAL HISTORY: Past Medical History:  Diagnosis Date  . Adrenal insufficiency ([place])   .  Bipolar disorder ([place])   . Cardiomyopathy ([place])   . CHF (congestive heart failure) ([place]) 2016   Dr. Nehemiah Massed  . Depression   . GERD (gastroesophageal reflux disease) 2012  . Hypertension   . Hypothyroidism   . Joint pain   . Lumbar herniated disc 2010  . Malignant melanoma of skin of lower limb, including hip ([place]) 2008, 2017   right thigh/2017- stage 4  . Osteoporosis, postmenopausal   . Overactive bladder   . Pancreatitis   . Panhypopituitarism ([place])   . Personal history of chemotherapy 2008   melonoma  . Personal history of malignant melanoma of skin 2012  . Personal history of radiation therapy 2017   melonoma  . Rectal bleeding   . Supraglottitis   . Tendonitis   . Tick bite of head 07/20/2015  . Weight loss, unintentional     PAST SURGICAL HISTORY: Past Surgical History:  Procedure Laterality Date  . BACK SURGERY  2010  . BREAST CYST EXCISION  1985  . COLONOSCOPY  07/2012   [facility]  . COLONOSCOPY WITH PROPOFOL N/A 11/21/2016   Procedure: COLONOSCOPY WITH PROPOFOL;  Surgeon: Manya Silvas, MD;  Location: [facility] ENDOSCOPY;  Service: Endoscopy;  Laterality: N/A;  . ESOPHAGOGASTRODUODENOSCOPY    . ESOPHAGOGASTRODUODENOSCOPY (EGD) WITH PROPOFOL N/A 11/21/2016   Procedure: ESOPHAGOGASTRODUODENOSCOPY (EGD) WITH PROPOFOL;  Surgeon: Manya Silvas, MD;  Location: [facility] ENDOSCOPY;  Service: Endoscopy;  Laterality: N/A;  . FRACTURE SURGERY     rod removed from left femur 07/21/2014  . INGUINAL LYMPH NODE BIOPSY Right 07/17/2015   Procedure: INGUINAL LYMPH NODE BIOPSY/EXCISION;  Surgeon: Robert Bellow, MD;  Location: [facility] ORS;  Service: General;  Laterality: Right;  .  laminnotomy reexploration posterior lumbar   12/26/2008   with nerve decomp and discectomy  . MELANOMA EXCISION  2008   right thigh  . PORTACATH PLACEMENT Left 03/14/2016   Procedure: INSERTION PORT-A-CATH;  Surgeon: Robert Bellow, MD;  Location: [facility] ORS;  Service: General;  Laterality: Left;  . SKIN  LESION EXCISION Right 12-19-10   posterior  . SPINE SURGERY      FAMILY HISTORY: Family History  Problem Relation Age of Onset  . COPD Father   . Cancer Sister        Colon, sister       ADVANCED DIRECTIVES:    HEALTH MAINTENANCE: Social History   Tobacco Use  . Smoking status: Current Every Day Smoker    Packs/day: 0.50    Years: 20.00    Pack years: 10.00    Types: Cigarettes  . Smokeless tobacco: Never Used  Substance Use Topics  . Alcohol use: Yes    Alcohol/week: 0.0 oz    Comment: ocassionally  . Drug use: No     Colonoscopy:  PAP:  Bone density:  Lipid panel:  Allergies  Allergen Reactions  . Clindamycin Shortness Of Breath  . Levaquin [Levofloxacin] Other (See Comments)    Joint inflammation  . Aripiprazole Other (See Comments)    Facial and head ticks  . Depakote [Divalproex Sodium] Other (See Comments)  . Valproic Acid     Other reaction(s): Other (See Comments) Facial twitching    Current Outpatient Medications  Medication Sig Dispense Refill  . aspirin-acetaminophen-caffeine (EXCEDRIN MIGRAINE) 250-250-65 MG tablet Take 2 tablets by mouth daily as needed for headache.    . calcium carbonate (OS-CAL) 600 MG TABS tablet Take 600 mg by mouth 2 (two) times daily with a meal.    . clonazePAM (KLONOPIN) 0.5 MG tablet Take 0.5 mg by mouth at bedtime.    . cyanocobalamin (,VITAMIN B-12,) 1000 MCG/ML injection INJECT 1ML INTO THE MUSCLE MONTHLY  6  . furosemide (LASIX) 20 MG tablet Take 20 mg by mouth as needed.     . gabapentin (NEURONTIN) 300 MG capsule Take 300 mg by mouth 3 (three) times daily.    Marland Kitchen HYDROcodone-acetaminophen (NORCO/VICODIN) 5-325 MG tablet Take 1 tablet by mouth every 4 (four) hours as needed for moderate pain. 60 tablet 0  . hydrocortisone (CORTEF) 10 MG tablet Take 10-15 mg by mouth 2 (two) times daily. 1.5 tablets (15 MG) in the morning and 1 tablet (10 MG) in the evening    . levothyroxine (SYNTHROID, LEVOTHROID) 112 MCG tablet  Take 112 mcg by mouth daily before breakfast.     . lipase/protease/amylase (CREON) 12000 units CPEP capsule Take 12,000 Units by mouth 3 (three) times daily with meals.    . Metoclopramide HCl (REGLAN PO) Take by mouth.    . metoprolol tartrate (LOPRESSOR) 25 MG tablet Take 12.5 mg by mouth 2 (two) times daily.     . Multiple Vitamin (MULTIVITAMIN WITH MINERALS) TABS tablet Take 1 tablet by mouth daily.    Marland Kitchen nystatin (MYCOSTATIN) 100000 UNIT/ML suspension Take 5 mLs (500,000 Units total) by mouth 4 (four) times daily. 120 mL 1  . ondansetron (ZOFRAN) 4 MG tablet Take 4 mg by mouth every 8 (eight) hours as needed for nausea or vomiting.    . pantoprazole (PROTONIX) 40 MG tablet TAKE 1 TABLET BY MOUTH TWICE A DAY 60 tablet 3  . predniSONE (DELTASONE) 10 MG tablet TAKE 1 TABLET (10 MG TOTAL) BY MOUTH DAILY WITH BREAKFAST. [place]  tablet 0  . predniSONE (STERAPRED UNI-PAK 21 TAB) 10 MG (21) TBPK tablet Taper as directed 21 tablet 0  . prochlorperazine (COMPAZINE) 10 MG tablet Take 1 tablet (10 mg total) by mouth every 6 (six) hours as needed for nausea or vomiting. 60 tablet 1  . trametinib dimethyl sulfoxide (MEKINIST) 2 MG tablet Take 1 tablet (2 mg total) by mouth daily. Take 1 hour before or 2 hours after a meal. Store refrigerated in original container. 30 tablet 5  . venlafaxine XR (EFFEXOR-XR) 150 MG 24 hr capsule 150 mg every morning.     . venlafaxine XR (EFFEXOR-XR) 75 MG 24 hr capsule Take 75 mg by mouth every evening.     No current facility-administered medications for this visit.     OBJECTIVE: Vitals:   05/10/17 0938 05/10/17 0943  BP:  (!) 145/95  Pulse:  67  Resp: 12   Temp:  (!) 97.3 F (36.3 C)     Body mass index is 23.29 kg/m.    ECOG FS:0 - Asymptomatic  General: Well-developed, well-nourished, no acute distress. Eyes: Pink conjunctiva, anicteric sclera. Lungs: Clear to auscultation bilaterally. Heart: Regular rate and rhythm. No rubs, murmurs, or gallops. Abdomen:  Soft, nontender, nondistended. No organomegaly noted, normoactive bowel sounds. Musculoskeletal: No edema, cyanosis, or clubbing.  Neuro: Alert, answering all questions appropriately. Cranial nerves grossly intact. Skin: Rash noted on arms and torso. Psych: Normal affect.   LAB RESULTS:  Lab Results  Component Value Date   NA 135 03/23/2017   K 4.0 03/23/2017   CL 99 (L) 03/23/2017   CO2 27 03/23/2017   GLUCOSE 100 (H) 03/23/2017   BUN 18 03/23/2017   CREATININE 1.85 (H) 03/23/2017   CALCIUM 8.8 (L) 03/23/2017   PROT 6.2 (L) 03/23/2017   ALBUMIN 3.9 03/23/2017   AST 28 03/23/2017   ALT 23 03/23/2017   ALKPHOS 62 03/23/2017   BILITOT 0.4 03/23/2017   GFRNONAA 30 (L) 03/23/2017   GFRAA 34 (L) 03/23/2017    Lab Results  Component Value Date   WBC 7.1 03/23/2017   NEUTROABS 5.5 03/23/2017   HGB 11.8 (L) 03/23/2017   HCT 34.6 (L) 03/23/2017   MCV 99.7 03/23/2017   PLT 300 03/23/2017    STUDIES: Nm Pet Image Restage (ps) Whole Body  Result Date: 05/09/2017 CLINICAL DATA:  Subsequent treatment strategy for malignant melanoma. EXAM: NUCLEAR MEDICINE PET WHOLE BODY TECHNIQUE: 7.8 mCi F-18 FDG was injected intravenously. Full-ring PET imaging was performed from the skull base to thigh after the radiotracer. CT data was obtained and used for attenuation correction and anatomic localization. Fasting blood glucose: 78 mg/dl Mediastinal blood pool activity: SUV max 2.5 COMPARISON:  02/01/2017 PET-CT. FINDINGS: HEAD/NECK: No hypermetabolic activity in the scalp. No hypermetabolic cervical lymph nodes. Incidental CT findings: none CHEST: No hypermetabolic axillary, mediastinal or hilar nodes. No pulmonary hypermetabolism. Incidental CT findings: No acute consolidative airspace disease, lung masses or significant pulmonary nodules. No pleural effusions. Left subclavian MediPort terminates at the cavoatrial junction. Left anterior descending coronary atherosclerosis. Mildly atherosclerotic  nonaneurysmal thoracic aorta. ABDOMEN/PELVIS: No abnormal hypermetabolic activity within the liver, pancreas, adrenal glands, or spleen. No hypermetabolic lymph nodes in the abdomen or pelvis. Mildly enlarged 1.5 cm right external iliac lymph node is stable in size and non hypermetabolic (series 3/image 944). Incidental CT findings: Mildly atherosclerotic nonaneurysmal abdominal aorta. SKELETON: No focal hypermetabolic activity to suggest skeletal metastasis. No recurrent hypermetabolism at the sclerotic focus in the anterior left sacrum (series 3/image 248).  Incidental CT findings: none EXTREMITIES: No abnormal hypermetabolic activity in the lower extremities. Incidental CT findings: none IMPRESSION: 1. No evidence of hypermetabolic metastatic disease. 2. Stable mildly enlarged non hypermetabolic right external iliac lymph node. 3. No recurrent hypermetabolism at the sclerotic focus in the anterior left sacrum. 4.  Aortic Atherosclerosis (ICD10-I70.0). Electronically Signed   By: Ilona Sorrel M.D.   On: 05/09/2017 12:21   Mm Screening Breast Tomo Bilateral  Result Date: 05/04/2017 CLINICAL DATA:  Screening. EXAM: DIGITAL SCREENING BILATERAL MAMMOGRAM WITH TOMO AND CAD COMPARISON:  Previous exam(s). ACR Breast Density Category b: There are scattered areas of fibroglandular density. FINDINGS: There are no findings suspicious for malignancy. Images were processed with CAD. IMPRESSION: No mammographic evidence of malignancy. A result letter of this screening mammogram will be mailed directly to the patient. RECOMMENDATION: Screening mammogram in one year. (Code:[id number]) BI-RADS CATEGORY  1: Negative. Electronically Signed   By: Curlene Dolphin M.D.   On: 05/04/2017 17:06    ONCOLOGIC HISTORY: Patient initially diagnosed with metastatic melanoma to right thigh with unknown primary in 2009. She received 4 weeks of high-dose interferon therapy in February 2009 and proceed with low-dose interferon therapy in March  2009. Patient remained in remission until August 2012 when she was noted to have recurrent melanoma and a pelvic lymph node. October 2012 she had 2 cycles of Ipilumimab which was discontinued secondary to poor tolerance. In April 2017 PET scan showed recurrent disease in the right inguinal region which was confirmed by biopsy. Zeboraf discontinued in November 2017 secondary to intolerable rash. Patient initiated combination immunotherapy with nivolumab and ipilumimab on January 14, 2016. Patient initiated maintenance nivolumab on April 14, 2016 and discontinued on November 07, 2016.  Single agent oral Mekinist from December 07, 2016 through December 2018.  ASSESSMENT: Recurrent metastatic melanoma, BRAF+, of right lower extremity, metastatic.  PLAN:    1. Recurrent metastatic melanoma, BRAF+, of right lower extremity, metastatic: PET scan results from May 09, 2017 reviewed independently and reported as above with no obvious recurrent or progressive disease.  She completed palliative XRT to her iliac crest lesion on August 11, 2016. Because of patient's persistent grade 3 diarrhea will not pursue any additional treatments with nivolumab.  She discontinued 2 mg trametinib (Mekinist) daily secondary to rash.  Will continue to hold treatment at this time. She expressed understanding she is at risk for recurrence.  If this occurs, will likely reinitiate Mekinist. Can also consider combination therapy using dabrafenib/trametinib in the future. Single agent Cobimetinib is also an option.  Per patient's insurance, no further PET scans will be covered therefore patient will return to clinic in 3 months with CT scan and bone scan.   2. Pain: Resolved. Patient states she has discontinued Plaquenil.  She no longer takes hydrocodone.   3. Nausea: Resolved.  Continue Compazine as needed. 4. Diarrhea: Resolved with Remicade. At least grade 3 and refractory to steroids. Colonoscopy and EGD were unrevealing. 5.  Pancreatitis: Resolved.  Unclear etiology. Continue follow-up with GI as indicated. 6. Elevated creatinine: Patient's creatinine continues to be mildly elevated.  Continue monitoring and treatment per nephrology.   7.  Rash: Likely secondary to Mekinist.  Unresponsive to steroid taper and topical treatments.     Patient expressed understanding and was in agreement with this plan. She also understands that She can call clinic at any time with any questions, concerns, or complaints.    Lloyd Huger, MD 05/13/17 8:06 AM

## 2017-05-09 ENCOUNTER — Encounter
Admission: RE | Admit: 2017-05-09 | Discharge: 2017-05-09 | Disposition: A | Discharge status: Home / Self Care | Payer: Medicare HMO | Source: Ambulatory Visit | Attending: Oncology | Admitting: Oncology

## 2017-05-09 DIAGNOSIS — C779 Secondary and unspecified malignant neoplasm of lymph node, unspecified: Secondary | ICD-10-CM | POA: Diagnosis present

## 2017-05-09 DIAGNOSIS — C439 Malignant melanoma of skin, unspecified: Secondary | ICD-10-CM | POA: Insufficient documentation

## 2017-05-09 DIAGNOSIS — E038 Other specified hypothyroidism: Secondary | ICD-10-CM | POA: Diagnosis not present

## 2017-05-09 DIAGNOSIS — E2749 Other adrenocortical insufficiency: Secondary | ICD-10-CM | POA: Diagnosis not present

## 2017-05-09 DIAGNOSIS — E230 Hypopituitarism: Secondary | ICD-10-CM | POA: Diagnosis not present

## 2017-05-09 LAB — GLUCOSE, CAPILLARY: Glucose-Capillary: 78 mg/dL (ref 65–99)

## 2017-05-09 MED ORDER — FLUDEOXYGLUCOSE F - 18 (FDG) INJECTION
7.4000 | Freq: Once | INTRAVENOUS | Status: AC | PRN
Start: 2017-05-09 — End: 2017-05-09
  Administered 2017-05-09: 7.76 via INTRAVENOUS

## 2017-05-10 ENCOUNTER — Inpatient Hospital Stay: Payer: Medicare HMO | Attending: Oncology | Admitting: Oncology

## 2017-05-10 ENCOUNTER — Encounter: Payer: Self-pay | Admitting: Oncology

## 2017-05-10 ENCOUNTER — Other Ambulatory Visit: Payer: Self-pay

## 2017-05-10 VITALS — BP 145/95 | HR 67 | Temp 97.3°F | Resp 12 | Ht 67.0 in | Wt 148.7 lb

## 2017-05-10 DIAGNOSIS — Z8719 Personal history of other diseases of the digestive system: Secondary | ICD-10-CM | POA: Diagnosis not present

## 2017-05-10 DIAGNOSIS — E230 Hypopituitarism: Secondary | ICD-10-CM | POA: Diagnosis not present

## 2017-05-10 DIAGNOSIS — R21 Rash and other nonspecific skin eruption: Secondary | ICD-10-CM | POA: Insufficient documentation

## 2017-05-10 DIAGNOSIS — E039 Hypothyroidism, unspecified: Secondary | ICD-10-CM

## 2017-05-10 DIAGNOSIS — I509 Heart failure, unspecified: Secondary | ICD-10-CM | POA: Diagnosis not present

## 2017-05-10 DIAGNOSIS — R69 Illness, unspecified: Secondary | ICD-10-CM | POA: Diagnosis not present

## 2017-05-10 DIAGNOSIS — F319 Bipolar disorder, unspecified: Secondary | ICD-10-CM | POA: Insufficient documentation

## 2017-05-10 DIAGNOSIS — Z85820 Personal history of malignant melanoma of skin: Secondary | ICD-10-CM

## 2017-05-10 DIAGNOSIS — M810 Age-related osteoporosis without current pathological fracture: Secondary | ICD-10-CM | POA: Diagnosis not present

## 2017-05-10 DIAGNOSIS — Z9221 Personal history of antineoplastic chemotherapy: Secondary | ICD-10-CM

## 2017-05-10 DIAGNOSIS — C799 Secondary malignant neoplasm of unspecified site: Secondary | ICD-10-CM

## 2017-05-10 DIAGNOSIS — Z79899 Other long term (current) drug therapy: Secondary | ICD-10-CM | POA: Insufficient documentation

## 2017-05-10 DIAGNOSIS — M2550 Pain in unspecified joint: Secondary | ICD-10-CM | POA: Diagnosis not present

## 2017-05-10 DIAGNOSIS — K219 Gastro-esophageal reflux disease without esophagitis: Secondary | ICD-10-CM | POA: Diagnosis not present

## 2017-05-10 DIAGNOSIS — Z923 Personal history of irradiation: Secondary | ICD-10-CM

## 2017-05-10 DIAGNOSIS — R944 Abnormal results of kidney function studies: Secondary | ICD-10-CM | POA: Diagnosis not present

## 2017-05-10 DIAGNOSIS — Z800 Family history of malignant neoplasm of digestive organs: Secondary | ICD-10-CM

## 2017-05-10 DIAGNOSIS — I700 Atherosclerosis of aorta: Secondary | ICD-10-CM | POA: Diagnosis not present

## 2017-05-10 DIAGNOSIS — E2740 Unspecified adrenocortical insufficiency: Secondary | ICD-10-CM | POA: Diagnosis not present

## 2017-05-10 DIAGNOSIS — F17210 Nicotine dependence, cigarettes, uncomplicated: Secondary | ICD-10-CM | POA: Diagnosis not present

## 2017-05-10 DIAGNOSIS — I429 Cardiomyopathy, unspecified: Secondary | ICD-10-CM | POA: Insufficient documentation

## 2017-05-10 DIAGNOSIS — C439 Malignant melanoma of skin, unspecified: Secondary | ICD-10-CM

## 2017-05-10 DIAGNOSIS — I10 Essential (primary) hypertension: Secondary | ICD-10-CM | POA: Diagnosis not present

## 2017-05-10 NOTE — Progress Notes (Signed)
Patient here for results no changes since last appt. 

## 2017-05-12 DIAGNOSIS — M25569 Pain in unspecified knee: Secondary | ICD-10-CM | POA: Diagnosis not present

## 2017-05-12 DIAGNOSIS — M2241 Chondromalacia patellae, right knee: Secondary | ICD-10-CM | POA: Diagnosis not present

## 2017-05-12 DIAGNOSIS — M2242 Chondromalacia patellae, left knee: Secondary | ICD-10-CM | POA: Diagnosis not present

## 2017-05-12 DIAGNOSIS — M170 Bilateral primary osteoarthritis of knee: Secondary | ICD-10-CM | POA: Diagnosis not present

## 2017-05-16 ENCOUNTER — Inpatient Hospital Stay: Payer: Medicare HMO

## 2017-05-16 DIAGNOSIS — E2740 Unspecified adrenocortical insufficiency: Secondary | ICD-10-CM | POA: Diagnosis not present

## 2017-05-16 DIAGNOSIS — R944 Abnormal results of kidney function studies: Secondary | ICD-10-CM | POA: Diagnosis not present

## 2017-05-16 DIAGNOSIS — R69 Illness, unspecified: Secondary | ICD-10-CM | POA: Diagnosis not present

## 2017-05-16 DIAGNOSIS — E038 Other specified hypothyroidism: Secondary | ICD-10-CM | POA: Diagnosis not present

## 2017-05-16 DIAGNOSIS — I429 Cardiomyopathy, unspecified: Secondary | ICD-10-CM | POA: Diagnosis not present

## 2017-05-16 DIAGNOSIS — Z95828 Presence of other vascular implants and grafts: Secondary | ICD-10-CM

## 2017-05-16 DIAGNOSIS — R21 Rash and other nonspecific skin eruption: Secondary | ICD-10-CM | POA: Diagnosis not present

## 2017-05-16 DIAGNOSIS — E039 Hypothyroidism, unspecified: Secondary | ICD-10-CM | POA: Diagnosis not present

## 2017-05-16 DIAGNOSIS — I10 Essential (primary) hypertension: Secondary | ICD-10-CM | POA: Diagnosis not present

## 2017-05-16 DIAGNOSIS — C439 Malignant melanoma of skin, unspecified: Secondary | ICD-10-CM

## 2017-05-16 DIAGNOSIS — E230 Hypopituitarism: Secondary | ICD-10-CM | POA: Diagnosis not present

## 2017-05-16 DIAGNOSIS — E2749 Other adrenocortical insufficiency: Secondary | ICD-10-CM | POA: Diagnosis not present

## 2017-05-16 DIAGNOSIS — I509 Heart failure, unspecified: Secondary | ICD-10-CM | POA: Diagnosis not present

## 2017-05-16 DIAGNOSIS — C779 Secondary and unspecified malignant neoplasm of lymph node, unspecified: Secondary | ICD-10-CM

## 2017-05-16 DIAGNOSIS — C4370 Malignant melanoma of unspecified lower limb, including hip: Secondary | ICD-10-CM

## 2017-05-16 DIAGNOSIS — K219 Gastro-esophageal reflux disease without esophagitis: Secondary | ICD-10-CM | POA: Diagnosis not present

## 2017-05-16 DIAGNOSIS — Z85820 Personal history of malignant melanoma of skin: Secondary | ICD-10-CM | POA: Diagnosis not present

## 2017-05-16 LAB — COMPREHENSIVE METABOLIC PANEL
ALBUMIN: 3.1 g/dL — AB (ref 3.5–5.0)
ALK PHOS: 69 U/L (ref 38–126)
ALT: 17 U/L (ref 14–54)
ANION GAP: 6 (ref 5–15)
AST: 32 U/L (ref 15–41)
BILIRUBIN TOTAL: 0.4 mg/dL (ref 0.3–1.2)
BUN: 18 mg/dL (ref 6–20)
CALCIUM: 8.3 mg/dL — AB (ref 8.9–10.3)
CO2: 24 mmol/L (ref 22–32)
Chloride: 107 mmol/L (ref 101–111)
Creatinine, Ser: 2.13 mg/dL — ABNORMAL HIGH (ref 0.44–1.00)
GFR calc non Af Amer: 25 mL/min — ABNORMAL LOW (ref >60)
GFR, EST AFRICAN AMERICAN: 29 mL/min — AB (ref >60)
Glucose, Bld: 80 mg/dL (ref 65–99)
POTASSIUM: 4.3 mmol/L (ref 3.5–5.1)
SODIUM: 137 mmol/L (ref 135–145)
TOTAL PROTEIN: 5.7 g/dL — AB (ref 6.5–8.1)

## 2017-05-16 LAB — CBC WITH DIFFERENTIAL/PLATELET
BASOS PCT: 1 %
Basophils Absolute: 0.1 10*3/uL (ref 0–0.1)
EOS ABS: 0.2 10*3/uL (ref 0–0.7)
Eosinophils Relative: 3 %
HCT: 32.3 % — ABNORMAL LOW (ref 35.0–47.0)
HEMOGLOBIN: 11.2 g/dL — AB (ref 12.0–16.0)
LYMPHS ABS: 0.8 10*3/uL — AB (ref 1.0–3.6)
Lymphocytes Relative: 14 %
MCH: 35.2 pg — AB (ref 26.0–34.0)
MCHC: 34.8 g/dL (ref 32.0–36.0)
MCV: 101.2 fL — ABNORMAL HIGH (ref 80.0–100.0)
MONO ABS: 0.4 10*3/uL (ref 0.2–0.9)
MONOS PCT: 6 %
NEUTROS PCT: 76 %
Neutro Abs: 4.6 10*3/uL (ref 1.4–6.5)
Platelets: 311 10*3/uL (ref 150–440)
RBC: 3.19 MIL/uL — ABNORMAL LOW (ref 3.80–5.20)
RDW: 12.8 % (ref 11.5–14.5)
WBC: 6 10*3/uL (ref 3.6–11.0)

## 2017-05-16 LAB — PHOSPHORUS: PHOSPHORUS: 3.2 mg/dL (ref 2.5–4.6)

## 2017-05-16 LAB — MAGNESIUM: Magnesium: 2.1 mg/dL (ref 1.7–2.4)

## 2017-05-16 MED ORDER — SODIUM CHLORIDE 0.9% FLUSH
10.0000 mL | INTRAVENOUS | Status: DC | PRN
  Administered 2017-05-16: 10 mL via INTRAVENOUS
  Filled 2017-05-16: qty 10

## 2017-05-16 MED ORDER — HEPARIN SOD (PORK) LOCK FLUSH 100 UNIT/ML IV SOLN
500.0000 [IU] | Freq: Once | INTRAVENOUS | Status: AC
  Administered 2017-05-16: 500 [IU] via INTRAVENOUS

## 2017-05-17 LAB — THYROID PANEL WITH TSH
FREE THYROXINE INDEX: 0.9 — AB (ref 1.2–4.9)
T3 UPTAKE RATIO: 24 % (ref 24–39)
T4 TOTAL: 3.7 ug/dL — AB (ref 4.5–12.0)
TSH: 0.325 u[IU]/mL — AB (ref 0.450–4.500)

## 2017-05-18 DIAGNOSIS — I890 Lymphedema, not elsewhere classified: Secondary | ICD-10-CM | POA: Diagnosis not present

## 2017-05-18 DIAGNOSIS — N189 Chronic kidney disease, unspecified: Secondary | ICD-10-CM | POA: Diagnosis not present

## 2017-05-18 DIAGNOSIS — Z124 Encounter for screening for malignant neoplasm of cervix: Secondary | ICD-10-CM | POA: Diagnosis not present

## 2017-05-18 DIAGNOSIS — Z0000 Encounter for general adult medical examination without abnormal findings: Secondary | ICD-10-CM | POA: Diagnosis not present

## 2017-05-18 DIAGNOSIS — K8689 Other specified diseases of pancreas: Secondary | ICD-10-CM | POA: Diagnosis not present

## 2017-05-30 DIAGNOSIS — I10 Essential (primary) hypertension: Secondary | ICD-10-CM | POA: Diagnosis not present

## 2017-05-30 DIAGNOSIS — N183 Chronic kidney disease, stage 3 (moderate): Secondary | ICD-10-CM | POA: Diagnosis not present

## 2017-05-30 DIAGNOSIS — N179 Acute kidney failure, unspecified: Secondary | ICD-10-CM | POA: Diagnosis not present

## 2017-05-30 DIAGNOSIS — R600 Localized edema: Secondary | ICD-10-CM | POA: Diagnosis not present

## 2017-05-31 DIAGNOSIS — M170 Bilateral primary osteoarthritis of knee: Secondary | ICD-10-CM | POA: Diagnosis not present

## 2017-05-31 DIAGNOSIS — M2242 Chondromalacia patellae, left knee: Secondary | ICD-10-CM | POA: Diagnosis not present

## 2017-05-31 DIAGNOSIS — M2241 Chondromalacia patellae, right knee: Secondary | ICD-10-CM | POA: Diagnosis not present

## 2017-06-02 ENCOUNTER — Institutional Professional Consult (permissible substitution): Payer: Medicare HMO | Admitting: Internal Medicine

## 2017-06-05 ENCOUNTER — Telehealth: Payer: Self-pay | Admitting: *Deleted

## 2017-06-05 NOTE — Telephone Encounter (Signed)
Pt left vm today with several concerns. First she would like to know why she is only having CT abdomen and pelvis, she feels like it should be her whole body. Pt is scheduled for ct scan and whole body bone scan as insurance refuses to approve PET scans at this time. She would also like you to know she has swelling in her left leg, would like to see someone regarding lymphedema. Pt also reports she has redness that is painful at times to the back of her thighs. Let me know how you would like to proceed with scans. Do you want her to be seen in symptom management clinic?

## 2017-06-05 NOTE — Telephone Encounter (Signed)
Pt can be seen in [facility] if needed

## 2017-06-06 DIAGNOSIS — M2241 Chondromalacia patellae, right knee: Secondary | ICD-10-CM | POA: Diagnosis not present

## 2017-06-06 DIAGNOSIS — M170 Bilateral primary osteoarthritis of knee: Secondary | ICD-10-CM | POA: Diagnosis not present

## 2017-06-06 DIAGNOSIS — M2242 Chondromalacia patellae, left knee: Secondary | ICD-10-CM | POA: Diagnosis not present

## 2017-06-06 DIAGNOSIS — R21 Rash and other nonspecific skin eruption: Secondary | ICD-10-CM | POA: Diagnosis not present

## 2017-06-06 NOTE — Telephone Encounter (Signed)
Left vm for pt to return call  

## 2017-06-07 ENCOUNTER — Ambulatory Visit: Payer: Medicare HMO | Admitting: Internal Medicine

## 2017-06-07 ENCOUNTER — Encounter: Payer: Self-pay | Admitting: Internal Medicine

## 2017-06-07 ENCOUNTER — Inpatient Hospital Stay: Payer: Medicare HMO | Admitting: Oncology

## 2017-06-07 VITALS — BP 100/68 | HR 85 | Resp 16 | Ht 67.0 in | Wt 152.0 lb

## 2017-06-07 DIAGNOSIS — G4719 Other hypersomnia: Secondary | ICD-10-CM

## 2017-06-07 DIAGNOSIS — E039 Hypothyroidism, unspecified: Secondary | ICD-10-CM | POA: Diagnosis not present

## 2017-06-07 DIAGNOSIS — R21 Rash and other nonspecific skin eruption: Secondary | ICD-10-CM | POA: Diagnosis not present

## 2017-06-07 DIAGNOSIS — N183 Chronic kidney disease, stage 3 (moderate): Secondary | ICD-10-CM | POA: Diagnosis not present

## 2017-06-07 DIAGNOSIS — R51 Headache: Secondary | ICD-10-CM | POA: Diagnosis not present

## 2017-06-07 DIAGNOSIS — N179 Acute kidney failure, unspecified: Secondary | ICD-10-CM | POA: Diagnosis not present

## 2017-06-07 DIAGNOSIS — R69 Illness, unspecified: Secondary | ICD-10-CM | POA: Diagnosis not present

## 2017-06-07 DIAGNOSIS — N2581 Secondary hyperparathyroidism of renal origin: Secondary | ICD-10-CM | POA: Diagnosis not present

## 2017-06-07 DIAGNOSIS — L309 Dermatitis, unspecified: Secondary | ICD-10-CM | POA: Diagnosis not present

## 2017-06-07 DIAGNOSIS — L538 Other specified erythematous conditions: Secondary | ICD-10-CM | POA: Diagnosis not present

## 2017-06-07 DIAGNOSIS — L501 Idiopathic urticaria: Secondary | ICD-10-CM | POA: Diagnosis not present

## 2017-06-07 DIAGNOSIS — D485 Neoplasm of uncertain behavior of skin: Secondary | ICD-10-CM | POA: Diagnosis not present

## 2017-06-07 DIAGNOSIS — I5022 Chronic systolic (congestive) heart failure: Secondary | ICD-10-CM | POA: Diagnosis not present

## 2017-06-07 DIAGNOSIS — I10 Essential (primary) hypertension: Secondary | ICD-10-CM | POA: Diagnosis not present

## 2017-06-07 DIAGNOSIS — E8809 Other disorders of plasma-protein metabolism, not elsewhere classified: Secondary | ICD-10-CM | POA: Diagnosis not present

## 2017-06-07 DIAGNOSIS — Z6824 Body mass index (BMI) 24.0-24.9, adult: Secondary | ICD-10-CM | POA: Diagnosis not present

## 2017-06-07 DIAGNOSIS — R110 Nausea: Secondary | ICD-10-CM | POA: Diagnosis not present

## 2017-06-07 DIAGNOSIS — R600 Localized edema: Secondary | ICD-10-CM | POA: Diagnosis not present

## 2017-06-07 DIAGNOSIS — I130 Hypertensive heart and chronic kidney disease with heart failure and stage 1 through stage 4 chronic kidney disease, or unspecified chronic kidney disease: Secondary | ICD-10-CM | POA: Diagnosis not present

## 2017-06-07 DIAGNOSIS — R609 Edema, unspecified: Secondary | ICD-10-CM | POA: Diagnosis not present

## 2017-06-07 DIAGNOSIS — R222 Localized swelling, mass and lump, trunk: Secondary | ICD-10-CM | POA: Diagnosis not present

## 2017-06-07 DIAGNOSIS — Z85820 Personal history of malignant melanoma of skin: Secondary | ICD-10-CM | POA: Diagnosis not present

## 2017-06-07 NOTE — Patient Instructions (Signed)
Will send for sleep study.

## 2017-06-07 NOTE — Progress Notes (Addendum)
[facility] Consultation      Assessment and Plan:  Excessive daytime sleepiness, excessive fatigue prior history of obstructive sleep apnea. Insomnia, currently on Klonopin -Patient opted in the past not to start CPAP, will need to order a new sleep study in order to qualify her for CPAP. - Patient's fatigue may otherwise be multifactorial secondary to chronic disease from her cancer and chemotherapy, as well as chronic Klonopin use.  Will reassess once patient has been started on CPAP if needed.  Addendum 06/14/17; Outside baseline sleep study 04/30/14; AHI was 9.1, c/w mild OSA, PLMI 37 with arousal index of 3.3 Cpap titration 06/17/14; PLMI of 32, arousal index of 3.7. Required CPAP at pressure of 6.  Nicotine abuse. -Recommend cessation.  Date: 06/07/2017  MRN# [id number] Patricia Li [id number]  Referring Physician: Dr. Manuella Ghazi.   Patricia Li is a 57 y.o. old female seen in consultation for chief complaint of:    Chief Complaint  Patient presents with  . Consult    Referred by Dr. Jolyn Nap for eval OSA:    HPI:   Patient is a 57 year old female presents for evaluation of obstructive sleep apnea.  Her history includes hypertension, hypothyroidism, GERD, depression, congestive heart failure with cardiomyopathy, bipolar disorder and adrenal insufficiency and recurrent metastatic melanoma.    Patient usually goes to bed between 9 and 10 PM, she falls asleep quickly.  She usually gets out of bed at 6 AM.  Her Epworth score is elevated at 13 today. She was diagnosed with OSA about 5 years ago, she had apnea when she was laying on her back. She notes that she does not sleep on her back. She takes klonopin at night because she otherwise can not sleep through the night.  She is always sleepy and tired during the day, but she is on treatment for metastatic melanoma. Her father had OSA and CHF.    PMHX:   Past Medical History:  Diagnosis Date  . Adrenal  insufficiency ([place])   . Bipolar disorder ([place])   . Cardiomyopathy ([place])   . CHF (congestive heart failure) ([place]) 2016   Dr. Nehemiah Massed  . Depression   . GERD (gastroesophageal reflux disease) 2012  . Hypertension   . Hypothyroidism   . Joint pain   . Lumbar herniated disc 2010  . Malignant melanoma of skin of lower limb, including hip ([place]) 2008, 2017   right thigh/2017- stage 4  . Osteoporosis, postmenopausal   . Overactive bladder   . Pancreatitis   . Panhypopituitarism ([place])   . Personal history of chemotherapy 2008   melonoma  . Personal history of malignant melanoma of skin 2012  . Personal history of radiation therapy 2017   melonoma  . Rectal bleeding   . Supraglottitis   . Tendonitis   . Tick bite of head 07/20/2015  . Weight loss, unintentional    Surgical Hx:  Past Surgical History:  Procedure Laterality Date  . BACK SURGERY  2010  . BREAST CYST EXCISION  1985  . COLONOSCOPY  07/2012   [facility]  . COLONOSCOPY WITH PROPOFOL N/A 11/21/2016   Procedure: COLONOSCOPY WITH PROPOFOL;  Surgeon: Manya Silvas, MD;  Location: [facility] ENDOSCOPY;  Service: Endoscopy;  Laterality: N/A;  . ESOPHAGOGASTRODUODENOSCOPY    . ESOPHAGOGASTRODUODENOSCOPY (EGD) WITH PROPOFOL N/A 11/21/2016   Procedure: ESOPHAGOGASTRODUODENOSCOPY (EGD) WITH PROPOFOL;  Surgeon: Manya Silvas, MD;  Location: [facility] ENDOSCOPY;  Service: Endoscopy;  Laterality: N/A;  . FRACTURE SURGERY  rod removed from left femur 07/21/2014  . INGUINAL LYMPH NODE BIOPSY Right 07/17/2015   Procedure: INGUINAL LYMPH NODE BIOPSY/EXCISION;  Surgeon: Robert Bellow, MD;  Location: [facility] ORS;  Service: General;  Laterality: Right;  . laminnotomy reexploration posterior lumbar   12/26/2008   with nerve decomp and discectomy  . MELANOMA EXCISION  2008   right thigh  . PORTACATH PLACEMENT Left 03/14/2016   Procedure: INSERTION PORT-A-CATH;  Surgeon: Robert Bellow, MD;  Location: [facility] ORS;  Service: General;  Laterality: Left;   . SKIN LESION EXCISION Right 12-19-10   posterior  . SPINE SURGERY     Family Hx:  Family History  Problem Relation Age of Onset  . COPD Father   . Cancer Sister        Colon, sister   Social Hx:   Social History   Tobacco Use  . Smoking status: Current Every Day Smoker    Packs/day: 0.50    Years: 20.00    Pack years: 10.00    Types: Cigarettes  . Smokeless tobacco: Never Used  Substance Use Topics  . Alcohol use: Yes    Alcohol/week: 0.0 oz    Comment: ocassionally  . Drug use: No   Medication:    Current Outpatient Medications:  .  calcitRIOL (ROCALTROL) 0.25 MCG capsule, Take 0.25 mcg by mouth daily., Disp: , Rfl: 6 .  calcium carbonate (OS-CAL) 600 MG TABS tablet, Take 600 mg by mouth 2 (two) times daily with a meal., Disp: , Rfl:  .  cetirizine (ZYRTEC ALLERGY) 10 MG tablet, Take 10 mg by mouth daily., Disp: , Rfl:  .  clonazePAM (KLONOPIN) 0.5 MG tablet, Take 0.5 mg by mouth at bedtime., Disp: , Rfl:  .  cyanocobalamin (,VITAMIN B-12,) 1000 MCG/ML injection, INJECT 1ML INTO THE MUSCLE MONTHLY, Disp: , Rfl: 6 .  famotidine (PEPCID) 40 MG tablet, Take 40 mg by mouth at bedtime., Disp: , Rfl:  .  furosemide (LASIX) 20 MG tablet, Take 20 mg by mouth as needed. , Disp: , Rfl:  .  gabapentin (NEURONTIN) 300 MG capsule, Take 300 mg by mouth 3 (three) times daily., Disp: , Rfl:  .  hydrocortisone (CORTEF) 10 MG tablet, Take 10-15 mg by mouth 2 (two) times daily. 1.5 tablets (15 MG) in the morning and 1 tablet (10 MG) in the evening, Disp: , Rfl:  .  hydrOXYzine (ATARAX/VISTARIL) 25 MG tablet, Take 25 mg by mouth daily., Disp: , Rfl: 2 .  levothyroxine (SYNTHROID, LEVOTHROID) 137 MCG tablet, Take 137 mcg by mouth daily., Disp: , Rfl:  .  Metoclopramide HCl (REGLAN PO), Take by mouth., Disp: , Rfl:  .  metoprolol tartrate (LOPRESSOR) 25 MG tablet, Take 12.5 mg by mouth 2 (two) times daily. , Disp: , Rfl:  .  montelukast (SINGULAIR) 10 MG tablet, Take 10 mg by mouth at  bedtime., Disp: , Rfl:  .  Multiple Vitamin (MULTIVITAMIN WITH MINERALS) TABS tablet, Take 1 tablet by mouth daily., Disp: , Rfl:  .  ondansetron (ZOFRAN) 4 MG tablet, Take 4 mg by mouth every 8 (eight) hours as needed for nausea or vomiting., Disp: , Rfl:  .  pantoprazole (PROTONIX) 40 MG tablet, TAKE 1 TABLET BY MOUTH TWICE A DAY, Disp: 60 tablet, Rfl: 3 .  prochlorperazine (COMPAZINE) 10 MG tablet, Take 1 tablet (10 mg total) by mouth every 6 (six) hours as needed for nausea or vomiting., Disp: 60 tablet, Rfl: 1 .  venlafaxine XR (EFFEXOR-XR) 150 MG 24  hr capsule, 150 mg every morning. , Disp: , Rfl:  .  venlafaxine XR (EFFEXOR-XR) 75 MG 24 hr capsule, Take 75 mg by mouth every evening., Disp: , Rfl:    Allergies:  Clindamycin; Levaquin [levofloxacin]; Aripiprazole; Depakote [divalproex sodium]; and Valproic acid  Review of Systems: Gen:  Denies  fever, sweats, chills HEENT: Denies blurred vision, double vision. bleeds, sore throat Mallampati 2 Cvc:  No dizziness, chest pain. Resp:   Denies cough or sputum production, shortness of breath Gi: Denies swallowing difficulty, stomach pain. Gu:  Denies bladder incontinence, burning urine Ext:   No Joint pain, stiffness. Skin: No skin rash,  hives  Endoc:  No polyuria, polydipsia. Psych: No depression, insomnia. Other:  All other systems were reviewed with the patient and were negative other that what is mentioned in the HPI.   Physical Examination:   VS: BP 100/68 (BP Location: Left Arm, Cuff Size: Normal)   Pulse 85   Resp 16   Ht 5\' 7"  (1.702 m)   Wt 152 lb (68.9 kg)   LMP 10/06/1997 (Approximate) Comment: No period since age 79.  SpO2 98%   BMI 23.81 kg/m   General Appearance: No distress  Neuro:without focal findings,  speech normal,  HEENT: PERRLA, EOM intact.   Pulmonary: normal breath sounds, No wheezing.  CardiovascularNormal S1,S2.  No m/r/g.   Abdomen: Benign, Soft, non-tender. Renal:  No costovertebral tenderness    GU:  No performed at this time. Endoc: No evident thyromegaly, no signs of acromegaly. Skin:   warm, no rashes, no ecchymosis  Extremities: normal, no cyanosis, clubbing.  Other findings:    LABORATORY PANEL:   CBC No results for input(s): WBC, HGB, HCT, PLT in the last 168 hours. ------------------------------------------------------------------------------------------------------------------  Chemistries  No results for input(s): NA, K, CL, CO2, GLUCOSE, BUN, CREATININE, CALCIUM, MG, AST, ALT, ALKPHOS, BILITOT in the last 168 hours.  Invalid input(s): GFRCGP ------------------------------------------------------------------------------------------------------------------  Cardiac Enzymes No results for input(s): TROPONINI in the last 168 hours. ------------------------------------------------------------  RADIOLOGY:  No results found.     Thank  you for the consultation and for allowing [facility] to assist in the care of your patient. Our recommendations are noted above.  Please contact us if we can be of further service.   Marda Stalker, MD.  Board Certified in Internal Medicine, Pulmonary Medicine, [place], and Sleep Medicine.  [facility] Office Number: [phone]  Patricia Pesa, M.D.  Merton Border, M.D  06/07/2017

## 2017-06-08 ENCOUNTER — Encounter: Payer: Medicare HMO | Admitting: Nurse Practitioner

## 2017-06-08 ENCOUNTER — Other Ambulatory Visit: Payer: Self-pay | Admitting: *Deleted

## 2017-06-09 DIAGNOSIS — M76891 Other specified enthesopathies of right lower limb, excluding foot: Secondary | ICD-10-CM | POA: Diagnosis not present

## 2017-06-09 DIAGNOSIS — M76892 Other specified enthesopathies of left lower limb, excluding foot: Secondary | ICD-10-CM | POA: Diagnosis not present

## 2017-06-15 ENCOUNTER — Other Ambulatory Visit: Payer: Self-pay | Admitting: Internal Medicine

## 2017-06-15 DIAGNOSIS — M2241 Chondromalacia patellae, right knee: Secondary | ICD-10-CM | POA: Diagnosis not present

## 2017-06-15 DIAGNOSIS — M170 Bilateral primary osteoarthritis of knee: Secondary | ICD-10-CM | POA: Diagnosis not present

## 2017-06-15 DIAGNOSIS — M2242 Chondromalacia patellae, left knee: Secondary | ICD-10-CM | POA: Diagnosis not present

## 2017-06-15 DIAGNOSIS — G4733 Obstructive sleep apnea (adult) (pediatric): Secondary | ICD-10-CM

## 2017-06-15 NOTE — Progress Notes (Signed)
lmtcb Patient has sleep study and per [employer] can be set up without a new one.

## 2017-06-19 ENCOUNTER — Telehealth: Payer: Self-pay | Admitting: Internal Medicine

## 2017-06-19 DIAGNOSIS — R197 Diarrhea, unspecified: Secondary | ICD-10-CM | POA: Diagnosis not present

## 2017-06-19 DIAGNOSIS — C799 Secondary malignant neoplasm of unspecified site: Secondary | ICD-10-CM | POA: Diagnosis not present

## 2017-06-19 DIAGNOSIS — K8681 Exocrine pancreatic insufficiency: Secondary | ICD-10-CM | POA: Diagnosis not present

## 2017-06-19 NOTE — Progress Notes (Signed)
LMOM for pt to call back.

## 2017-06-19 NOTE — Progress Notes (Signed)
Pt informed. Nothing further needed. 

## 2017-06-19 NOTE — Telephone Encounter (Signed)
Pt is returning your call

## 2017-06-21 ENCOUNTER — Inpatient Hospital Stay: Payer: Medicare HMO | Attending: Oncology

## 2017-06-21 DIAGNOSIS — M2242 Chondromalacia patellae, left knee: Secondary | ICD-10-CM | POA: Diagnosis not present

## 2017-06-21 DIAGNOSIS — L501 Idiopathic urticaria: Secondary | ICD-10-CM | POA: Diagnosis not present

## 2017-06-21 DIAGNOSIS — Z95828 Presence of other vascular implants and grafts: Secondary | ICD-10-CM

## 2017-06-21 DIAGNOSIS — Z452 Encounter for adjustment and management of vascular access device: Secondary | ICD-10-CM | POA: Diagnosis not present

## 2017-06-21 DIAGNOSIS — Z85820 Personal history of malignant melanoma of skin: Secondary | ICD-10-CM | POA: Diagnosis present

## 2017-06-21 DIAGNOSIS — M2241 Chondromalacia patellae, right knee: Secondary | ICD-10-CM | POA: Diagnosis not present

## 2017-06-21 DIAGNOSIS — M170 Bilateral primary osteoarthritis of knee: Secondary | ICD-10-CM | POA: Diagnosis not present

## 2017-06-21 DIAGNOSIS — R233 Spontaneous ecchymoses: Secondary | ICD-10-CM | POA: Diagnosis not present

## 2017-06-21 MED ORDER — SODIUM CHLORIDE 0.9% FLUSH
10.0000 mL | INTRAVENOUS | Status: AC | PRN
  Administered 2017-06-21: 10 mL
  Filled 2017-06-21: qty 10

## 2017-06-21 MED ORDER — HEPARIN SOD (PORK) LOCK FLUSH 100 UNIT/ML IV SOLN
500.0000 [IU] | INTRAVENOUS | Status: AC | PRN
  Administered 2017-06-21: 500 [IU]

## 2017-06-26 DIAGNOSIS — I10 Essential (primary) hypertension: Secondary | ICD-10-CM | POA: Diagnosis not present

## 2017-06-26 DIAGNOSIS — Z720 Tobacco use: Secondary | ICD-10-CM | POA: Diagnosis not present

## 2017-06-26 DIAGNOSIS — I5022 Chronic systolic (congestive) heart failure: Secondary | ICD-10-CM | POA: Diagnosis not present

## 2017-06-26 DIAGNOSIS — I340 Nonrheumatic mitral (valve) insufficiency: Secondary | ICD-10-CM | POA: Diagnosis not present

## 2017-06-27 ENCOUNTER — Telehealth: Payer: Self-pay | Admitting: Internal Medicine

## 2017-06-27 DIAGNOSIS — G4733 Obstructive sleep apnea (adult) (pediatric): Secondary | ICD-10-CM

## 2017-06-27 NOTE — Telephone Encounter (Signed)
HST already ordered.

## 2017-06-27 NOTE — Telephone Encounter (Signed)
[facility] calling  States that the sleep study they received was too old to use  Will need an updated study Please call [phone] with any questions

## 2017-06-27 NOTE — Telephone Encounter (Signed)
Per Beth at [facility] states that Sleep Study from 2016 was to old to use. Beth states that per Medicare guidelines study has to be within 365 days. Pt will either need a HST or in lab study which ever physician prefers. Carlos Levering Cobb With [employer] she can have either. Rhonda J Cobb

## 2017-06-27 NOTE — Telephone Encounter (Signed)
Spoke with patient and she will pick up device on Wed 07/05/17 between 3:30-4:30.  Nothing else needed at this time. Rhonda J Cobb

## 2017-06-27 NOTE — Telephone Encounter (Signed)
LMOVM for pt to return my call to schedule HST. Rhonda J Cobb

## 2017-06-28 DIAGNOSIS — M2242 Chondromalacia patellae, left knee: Secondary | ICD-10-CM | POA: Diagnosis not present

## 2017-06-28 DIAGNOSIS — M2241 Chondromalacia patellae, right knee: Secondary | ICD-10-CM | POA: Diagnosis not present

## 2017-06-28 DIAGNOSIS — M170 Bilateral primary osteoarthritis of knee: Secondary | ICD-10-CM | POA: Diagnosis not present

## 2017-07-04 DIAGNOSIS — M2242 Chondromalacia patellae, left knee: Secondary | ICD-10-CM | POA: Diagnosis not present

## 2017-07-04 DIAGNOSIS — M2241 Chondromalacia patellae, right knee: Secondary | ICD-10-CM | POA: Diagnosis not present

## 2017-07-04 DIAGNOSIS — M170 Bilateral primary osteoarthritis of knee: Secondary | ICD-10-CM | POA: Diagnosis not present

## 2017-07-05 ENCOUNTER — Encounter: Payer: Self-pay | Admitting: Internal Medicine

## 2017-07-05 DIAGNOSIS — G4733 Obstructive sleep apnea (adult) (pediatric): Secondary | ICD-10-CM | POA: Diagnosis not present

## 2017-07-05 DIAGNOSIS — G4719 Other hypersomnia: Secondary | ICD-10-CM

## 2017-07-07 DIAGNOSIS — G4733 Obstructive sleep apnea (adult) (pediatric): Secondary | ICD-10-CM | POA: Diagnosis not present

## 2017-07-10 ENCOUNTER — Telehealth: Payer: Self-pay | Admitting: *Deleted

## 2017-07-10 DIAGNOSIS — L97521 Non-pressure chronic ulcer of other part of left foot limited to breakdown of skin: Secondary | ICD-10-CM | POA: Insufficient documentation

## 2017-07-10 DIAGNOSIS — C779 Secondary and unspecified malignant neoplasm of lymph node, unspecified: Secondary | ICD-10-CM | POA: Diagnosis not present

## 2017-07-10 DIAGNOSIS — C439 Malignant melanoma of skin, unspecified: Secondary | ICD-10-CM | POA: Diagnosis not present

## 2017-07-10 DIAGNOSIS — G609 Hereditary and idiopathic neuropathy, unspecified: Secondary | ICD-10-CM | POA: Diagnosis not present

## 2017-07-10 DIAGNOSIS — G4733 Obstructive sleep apnea (adult) (pediatric): Secondary | ICD-10-CM

## 2017-07-10 NOTE — Telephone Encounter (Signed)
Patient returning call for results 

## 2017-07-10 NOTE — Telephone Encounter (Signed)
Patient is returning call. CB is [phone]

## 2017-07-10 NOTE — Telephone Encounter (Signed)
ATC pt, no answer. Left message for pt to call back.  

## 2017-07-10 NOTE — Telephone Encounter (Signed)
Left message to call back.  Recommendation: Auto-CPAP with pressure range of 5-15 cmH20 Date of study:04/07/2017 Last OV:06/07/2017

## 2017-07-10 NOTE — Telephone Encounter (Signed)
Patient aware of results Orders placed Nothing further needed. 

## 2017-07-11 DIAGNOSIS — R197 Diarrhea, unspecified: Secondary | ICD-10-CM | POA: Diagnosis not present

## 2017-07-11 DIAGNOSIS — B179 Acute viral hepatitis, unspecified: Secondary | ICD-10-CM | POA: Diagnosis not present

## 2017-07-11 DIAGNOSIS — K8681 Exocrine pancreatic insufficiency: Secondary | ICD-10-CM | POA: Diagnosis not present

## 2017-07-17 DIAGNOSIS — G4733 Obstructive sleep apnea (adult) (pediatric): Secondary | ICD-10-CM | POA: Diagnosis not present

## 2017-07-20 DIAGNOSIS — N183 Chronic kidney disease, stage 3 (moderate): Secondary | ICD-10-CM | POA: Diagnosis not present

## 2017-07-20 DIAGNOSIS — R600 Localized edema: Secondary | ICD-10-CM | POA: Diagnosis not present

## 2017-07-20 DIAGNOSIS — N179 Acute kidney failure, unspecified: Secondary | ICD-10-CM | POA: Diagnosis not present

## 2017-07-20 DIAGNOSIS — N2581 Secondary hyperparathyroidism of renal origin: Secondary | ICD-10-CM | POA: Diagnosis not present

## 2017-07-20 DIAGNOSIS — I10 Essential (primary) hypertension: Secondary | ICD-10-CM | POA: Diagnosis not present

## 2017-08-10 ENCOUNTER — Ambulatory Visit
Admission: RE | Admit: 2017-08-10 | Discharge: 2017-08-10 | Disposition: A | Discharge status: Home / Self Care | Payer: Medicare HMO | Source: Ambulatory Visit | Attending: Oncology | Admitting: Oncology

## 2017-08-10 ENCOUNTER — Encounter
Admission: RE | Admit: 2017-08-10 | Discharge: 2017-08-10 | Disposition: A | Discharge status: Home / Self Care | Payer: Medicare HMO | Source: Ambulatory Visit | Attending: Oncology | Admitting: Oncology

## 2017-08-10 ENCOUNTER — Other Ambulatory Visit: Payer: Self-pay | Admitting: Oncology

## 2017-08-10 ENCOUNTER — Inpatient Hospital Stay: Payer: Medicare HMO | Attending: Oncology

## 2017-08-10 DIAGNOSIS — I509 Heart failure, unspecified: Secondary | ICD-10-CM | POA: Diagnosis not present

## 2017-08-10 DIAGNOSIS — E2740 Unspecified adrenocortical insufficiency: Secondary | ICD-10-CM | POA: Insufficient documentation

## 2017-08-10 DIAGNOSIS — R5383 Other fatigue: Secondary | ICD-10-CM | POA: Insufficient documentation

## 2017-08-10 DIAGNOSIS — R109 Unspecified abdominal pain: Secondary | ICD-10-CM | POA: Diagnosis not present

## 2017-08-10 DIAGNOSIS — Z85820 Personal history of malignant melanoma of skin: Secondary | ICD-10-CM | POA: Insufficient documentation

## 2017-08-10 DIAGNOSIS — R590 Localized enlarged lymph nodes: Secondary | ICD-10-CM | POA: Diagnosis not present

## 2017-08-10 DIAGNOSIS — I429 Cardiomyopathy, unspecified: Secondary | ICD-10-CM | POA: Insufficient documentation

## 2017-08-10 DIAGNOSIS — I700 Atherosclerosis of aorta: Secondary | ICD-10-CM | POA: Diagnosis not present

## 2017-08-10 DIAGNOSIS — R948 Abnormal results of function studies of other organs and systems: Secondary | ICD-10-CM | POA: Insufficient documentation

## 2017-08-10 DIAGNOSIS — K219 Gastro-esophageal reflux disease without esophagitis: Secondary | ICD-10-CM | POA: Insufficient documentation

## 2017-08-10 DIAGNOSIS — E039 Hypothyroidism, unspecified: Secondary | ICD-10-CM | POA: Diagnosis not present

## 2017-08-10 DIAGNOSIS — E230 Hypopituitarism: Secondary | ICD-10-CM | POA: Insufficient documentation

## 2017-08-10 DIAGNOSIS — C439 Malignant melanoma of skin, unspecified: Secondary | ICD-10-CM

## 2017-08-10 DIAGNOSIS — F17210 Nicotine dependence, cigarettes, uncomplicated: Secondary | ICD-10-CM | POA: Insufficient documentation

## 2017-08-10 DIAGNOSIS — C799 Secondary malignant neoplasm of unspecified site: Secondary | ICD-10-CM

## 2017-08-10 DIAGNOSIS — R630 Anorexia: Secondary | ICD-10-CM | POA: Insufficient documentation

## 2017-08-10 DIAGNOSIS — Z79899 Other long term (current) drug therapy: Secondary | ICD-10-CM | POA: Insufficient documentation

## 2017-08-10 DIAGNOSIS — M810 Age-related osteoporosis without current pathological fracture: Secondary | ICD-10-CM | POA: Insufficient documentation

## 2017-08-10 DIAGNOSIS — R531 Weakness: Secondary | ICD-10-CM | POA: Diagnosis not present

## 2017-08-10 DIAGNOSIS — Z95828 Presence of other vascular implants and grafts: Secondary | ICD-10-CM

## 2017-08-10 LAB — POCT I-STAT CREATININE: Creatinine, Ser: 2 mg/dL — ABNORMAL HIGH (ref 0.44–1.00)

## 2017-08-10 MED ORDER — HEPARIN SOD (PORK) LOCK FLUSH 100 UNIT/ML IV SOLN
500.0000 [IU] | Freq: Once | INTRAVENOUS | Status: AC
  Administered 2017-08-10: 500 [IU] via INTRAVENOUS

## 2017-08-10 MED ORDER — TECHNETIUM TC 99M MEDRONATE IV KIT
20.0000 | PACK | Freq: Once | INTRAVENOUS | Status: AC | PRN
  Administered 2017-08-10: 22.28 via INTRAVENOUS

## 2017-08-10 MED ORDER — SODIUM CHLORIDE 0.9% FLUSH
10.0000 mL | Freq: Once | INTRAVENOUS | Status: AC
  Administered 2017-08-10: 10 mL via INTRAVENOUS
  Filled 2017-08-10: qty 10

## 2017-08-13 NOTE — Progress Notes (Signed)
[place]  [phone] [phone]  ID: Patricia Li OB: [birth year]  MR#: [id number]  QQV#:[id number]  Patient Care Team: System, Pcp Not In as PCP - General Byrnett, Forest Gleason, MD as Consulting Physician (General Surgery) Forest Gleason, MD (Oncology) Lloyd Huger, MD as Consulting Physician (Oncology)  CHIEF COMPLAINT: Recurrent metastatic melanoma, BRAF+, of right lower extremity, metastatic.  INTERVAL HISTORY: Patient returns to clinic today for further evaluation and discussion of her imaging results.  She continues to have persistent loose stools.  She also has chronic weakness and fatigue.  She otherwise feels well. She has no neurologic complaints. She denies any recent fevers or illnesses. She has a good appetite. She denies any chest pain or shortness of breath. She has no nausea, vomiting, diarrhea, or constipation. She denies any joint pain.  She has no urinary complaints.  Patient offers no further specific complaints today.    REVIEW OF SYSTEMS:   Review of Systems  Constitutional: Positive for malaise/fatigue. Negative for fever and weight loss.  HENT: Negative.  Negative for ear pain and tinnitus.   Eyes: Negative.   Respiratory: Negative.  Negative for cough and shortness of breath.   Cardiovascular: Negative.  Negative for chest pain and leg swelling.  Gastrointestinal: Negative for abdominal pain, blood in stool, diarrhea, heartburn and melena.  Genitourinary: Negative.  Negative for dysuria.  Musculoskeletal: Negative.  Negative for back pain and joint pain.  Skin: Negative.  Negative for itching and rash.  Neurological: Positive for weakness. Negative for sensory change and focal weakness.  Endo/Heme/Allergies: Does not bruise/bleed easily.  Psychiatric/Behavioral: Negative.  Negative for depression. The patient is not nervous/anxious and does not have insomnia.     As per HPI. Otherwise, a complete review of  systems is negative.  PAST MEDICAL HISTORY: Past Medical History:  Diagnosis Date  . Adrenal insufficiency ([place])   . Bipolar disorder ([place])   . Cardiomyopathy ([place])   . CHF (congestive heart failure) ([place]) 2016   Dr. Nehemiah Massed  . Depression   . GERD (gastroesophageal reflux disease) 2012  . Hypertension   . Hypothyroidism   . Joint pain   . Lumbar herniated disc 2010  . Malignant melanoma of skin of lower limb, including hip ([place]) 2008, 2017   right thigh/2017- stage 4  . Osteoporosis, postmenopausal   . Overactive bladder   . Pancreatitis   . Panhypopituitarism ([place])   . Personal history of chemotherapy 2008   melonoma  . Personal history of malignant melanoma of skin 2012  . Personal history of radiation therapy 2017   melonoma  . Rectal bleeding   . Supraglottitis   . Tendonitis   . Tick bite of head 07/20/2015  . Weight loss, unintentional     PAST SURGICAL HISTORY: Past Surgical History:  Procedure Laterality Date  . BACK SURGERY  2010  . BREAST CYST EXCISION  1985  . COLONOSCOPY  07/2012   [facility]  . COLONOSCOPY WITH PROPOFOL N/A 11/21/2016   Procedure: COLONOSCOPY WITH PROPOFOL;  Surgeon: Manya Silvas, MD;  Location: [facility] ENDOSCOPY;  Service: Endoscopy;  Laterality: N/A;  . ESOPHAGOGASTRODUODENOSCOPY    . ESOPHAGOGASTRODUODENOSCOPY (EGD) WITH PROPOFOL N/A 11/21/2016   Procedure: ESOPHAGOGASTRODUODENOSCOPY (EGD) WITH PROPOFOL;  Surgeon: Manya Silvas, MD;  Location: [facility] ENDOSCOPY;  Service: Endoscopy;  Laterality: N/A;  . FRACTURE SURGERY     rod removed from left femur 07/21/2014  . INGUINAL LYMPH NODE BIOPSY Right 07/17/2015   Procedure: INGUINAL LYMPH NODE  BIOPSY/EXCISION;  Surgeon: Robert Bellow, MD;  Location: [facility] ORS;  Service: General;  Laterality: Right;  . laminnotomy reexploration posterior lumbar   12/26/2008   with nerve decomp and discectomy  . MELANOMA EXCISION  2008   right thigh  . PORTACATH PLACEMENT Left 03/14/2016   Procedure:  INSERTION PORT-A-CATH;  Surgeon: Robert Bellow, MD;  Location: [facility] ORS;  Service: General;  Laterality: Left;  . SKIN LESION EXCISION Right 12-19-10   posterior  . SPINE SURGERY      FAMILY HISTORY: Family History  Problem Relation Age of Onset  . COPD Father   . Cancer Sister        Colon, sister       ADVANCED DIRECTIVES:    HEALTH MAINTENANCE: Social History   Tobacco Use  . Smoking status: Current Every Day Smoker    Packs/day: 0.50    Years: 20.00    Pack years: 10.00    Types: Cigarettes  . Smokeless tobacco: Never Used  Substance Use Topics  . Alcohol use: Yes    Alcohol/week: 0.0 oz    Comment: ocassionally  . Drug use: No     Colonoscopy:  PAP:  Bone density:  Lipid panel:  Allergies  Allergen Reactions  . Clindamycin Shortness Of Breath  . Levaquin [Levofloxacin] Other (See Comments)    Joint inflammation  . Aripiprazole Other (See Comments)    Facial and head ticks  . Depakote [Divalproex Sodium] Other (See Comments)  . Valproic Acid     Other reaction(s): Other (See Comments) Facial twitching  . Ondansetron Hcl Nausea And Vomiting  . Promethazine Nausea And Vomiting    Current Outpatient Medications  Medication Sig Dispense Refill  . calcitRIOL (ROCALTROL) 0.25 MCG capsule Take 0.25 mcg by mouth daily.  6  . calcium carbonate (OS-CAL) 600 MG TABS tablet Take 600 mg by mouth 2 (two) times daily with a meal.    . clonazePAM (KLONOPIN) 0.5 MG tablet Take 0.5 mg by mouth at bedtime.    . cyanocobalamin (,VITAMIN B-12,) 1000 MCG/ML injection INJECT 1ML INTO THE MUSCLE MONTHLY  6  . famotidine (PEPCID) 40 MG tablet Take 40 mg by mouth at bedtime.    . gabapentin (NEURONTIN) 300 MG capsule Take 300 mg by mouth 3 (three) times daily.    . hydrocortisone (CORTEF) 10 MG tablet Take 10-15 mg by mouth 2 (two) times daily. 1.5 tablets (15 MG) in the morning and 1 tablet (10 MG) in the evening    . levothyroxine (SYNTHROID, LEVOTHROID) 137 MCG tablet  Take 137 mcg by mouth daily.    . metoprolol tartrate (LOPRESSOR) 25 MG tablet Take 12.5 mg by mouth 2 (two) times daily.     . montelukast (SINGULAIR) 10 MG tablet Take 10 mg by mouth at bedtime.    . Multiple Vitamin (MULTIVITAMIN WITH MINERALS) TABS tablet Take 1 tablet by mouth daily.    . pantoprazole (PROTONIX) 40 MG tablet TAKE 1 TABLET BY MOUTH TWICE A DAY 60 tablet 3  . venlafaxine XR (EFFEXOR-XR) 150 MG 24 hr capsule 150 mg every morning.     . venlafaxine XR (EFFEXOR-XR) 75 MG 24 hr capsule Take 75 mg by mouth every evening.    . cetirizine (ZYRTEC ALLERGY) 10 MG tablet Take 10 mg by mouth daily.    . furosemide (LASIX) 20 MG tablet Take 20 mg by mouth as needed.     . furosemide (LASIX) 20 MG tablet TAKE 1 TABLET BY MOUTH EVERY  DAY    . hydrOXYzine (ATARAX/VISTARIL) 25 MG tablet Take 25 mg by mouth daily.  2  . Metoclopramide HCl (REGLAN PO) Take by mouth.    . ondansetron (ZOFRAN) 4 MG tablet Take 4 mg by mouth every 8 (eight) hours as needed for nausea or vomiting.    . prochlorperazine (COMPAZINE) 10 MG tablet Take 1 tablet (10 mg total) by mouth every 6 (six) hours as needed for nausea or vomiting. (Patient not taking: Reported on 08/14/2017) 60 tablet 1   No current facility-administered medications for this visit.     OBJECTIVE: Vitals:   08/14/17 1114  BP: 132/86  Pulse: 63  Resp: 18  Temp: (!) 96.9 F (36.1 C)     Body mass index is 22.37 kg/m.    ECOG FS:0 - Asymptomatic  General: Well-developed, well-nourished, no acute distress. Eyes: Pink conjunctiva, anicteric sclera. HEENT: Normocephalic, moist mucous membranes, clear oropharnyx. Lungs: Clear to auscultation bilaterally. Heart: Regular rate and rhythm. No rubs, murmurs, or gallops. Abdomen: Soft, nontender, nondistended. No organomegaly noted, normoactive bowel sounds. Musculoskeletal: No edema, cyanosis, or clubbing. Neuro: Alert, answering all questions appropriately. Cranial nerves grossly  intact. Skin: No rashes or petechiae noted. Psych: Normal affect. Lymphatics: No cervical, calvicular, axillary or inguinal LAD.   LAB RESULTS:  Lab Results  Component Value Date   NA 137 05/16/2017   K 4.3 05/16/2017   CL 107 05/16/2017   CO2 24 05/16/2017   GLUCOSE 80 05/16/2017   BUN 18 05/16/2017   CREATININE 2.00 (H) 08/10/2017   CALCIUM 8.3 (L) 05/16/2017   PROT 5.7 (L) 05/16/2017   ALBUMIN 3.1 (L) 05/16/2017   AST 32 05/16/2017   ALT 17 05/16/2017   ALKPHOS 69 05/16/2017   BILITOT 0.4 05/16/2017   GFRNONAA 25 (L) 05/16/2017   GFRAA 29 (L) 05/16/2017    Lab Results  Component Value Date   WBC 6.0 05/16/2017   NEUTROABS 4.6 05/16/2017   HGB 11.2 (L) 05/16/2017   HCT 32.3 (L) 05/16/2017   MCV 101.2 (H) 05/16/2017   PLT 311 05/16/2017    STUDIES: Ct Abdomen Pelvis Wo Contrast  Result Date: 08/10/2017 CLINICAL DATA:  Hx of portacath. Hx of lymph node removed in right groin. NKI. Hx of metastatic melanoma with chemo-last chemo pill use was December 2018. Pt does c/o generalized abdominal pain. No other symptoms. This is f/u to eval CA. History of chronic pancreatitis. No IV contrast. GFR 27 EXAM: CT CHEST, ABDOMEN AND PELVIS WITHOUT CONTRAST TECHNIQUE: Multidetector CT imaging of the chest, abdomen and pelvis was performed following the standard protocol without IV contrast. COMPARISON:  CT 05/09/2017 FINDINGS: CT CHEST FINDINGS Cardiovascular: Port in the anterior chest wall with tip in distal SVC. Coronary artery calcification and aortic atherosclerotic calcification. Mediastinum/Nodes: No axillary supraclavicular adenopathy. No mediastinal hilar adenopathy. No pericardial effusion. Esophagus normal Lungs/Pleura: No suspicious pulmonary nodules. Airways normal. Pleural normal. Musculoskeletal: No aggressive osseous lesion. CT ABDOMEN AND PELVIS FINDINGS Hepatobiliary: No focal hepatic lesion. No biliary ductal dilatation. Gallbladder is normal. Common bile duct is normal.  Pancreas: Pancreas is normal. No ductal dilatation. No pancreatic inflammation. Spleen: Normal spleen Adrenals/urinary tract: Adrenal glands kidneys normal. Ureters and bladder normal. Stomach/Bowel: Stomach, small bowel, appendix, and cecum are normal. The colon and rectosigmoid colon are normal. Vascular/Lymphatic: Abdominal aorta is normal caliber. There is no retroperitoneal or periportal lymphadenopathy. No pelvic lymphadenopathy. Mildly enlarged RIGHT external iliac lymph node measuring 13 mm is unchanged from prior. Reproductive: Post hysterectomy anatomy. Other: No  peritoneal metastasis.  No retroperitoneal metastasis. Musculoskeletal: Several small subcentimeter sclerotic lesions in the LEFT iliac bone and sacrum unchanged from prior and favored benign. IMPRESSION: Chest Impression: No evidence of melanoma recurrence or metastasis Abdomen / Pelvis Impression: No evidence of melanoma recurrence or metastasis Stable enlarged RIGHT external iliac lymph node. Electronically Signed   By: Suzy Bouchard M.D.   On: 08/10/2017 10:44   Ct Chest Wo Contrast  Result Date: 08/10/2017 CLINICAL DATA:  Hx of portacath. Hx of lymph node removed in right groin. NKI. Hx of metastatic melanoma with chemo-last chemo pill use was December 2018. Pt does c/o generalized abdominal pain. No other symptoms. This is f/u to eval CA. History of chronic pancreatitis. No IV contrast. GFR 27 EXAM: CT CHEST, ABDOMEN AND PELVIS WITHOUT CONTRAST TECHNIQUE: Multidetector CT imaging of the chest, abdomen and pelvis was performed following the standard protocol without IV contrast. COMPARISON:  CT 05/09/2017 FINDINGS: CT CHEST FINDINGS Cardiovascular: Port in the anterior chest wall with tip in distal SVC. Coronary artery calcification and aortic atherosclerotic calcification. Mediastinum/Nodes: No axillary supraclavicular adenopathy. No mediastinal hilar adenopathy. No pericardial effusion. Esophagus normal Lungs/Pleura: No suspicious  pulmonary nodules. Airways normal. Pleural normal. Musculoskeletal: No aggressive osseous lesion. CT ABDOMEN AND PELVIS FINDINGS Hepatobiliary: No focal hepatic lesion. No biliary ductal dilatation. Gallbladder is normal. Common bile duct is normal. Pancreas: Pancreas is normal. No ductal dilatation. No pancreatic inflammation. Spleen: Normal spleen Adrenals/urinary tract: Adrenal glands kidneys normal. Ureters and bladder normal. Stomach/Bowel: Stomach, small bowel, appendix, and cecum are normal. The colon and rectosigmoid colon are normal. Vascular/Lymphatic: Abdominal aorta is normal caliber. There is no retroperitoneal or periportal lymphadenopathy. No pelvic lymphadenopathy. Mildly enlarged RIGHT external iliac lymph node measuring 13 mm is unchanged from prior. Reproductive: Post hysterectomy anatomy. Other: No peritoneal metastasis.  No retroperitoneal metastasis. Musculoskeletal: Several small subcentimeter sclerotic lesions in the LEFT iliac bone and sacrum unchanged from prior and favored benign. IMPRESSION: Chest Impression: No evidence of melanoma recurrence or metastasis Abdomen / Pelvis Impression: No evidence of melanoma recurrence or metastasis Stable enlarged RIGHT external iliac lymph node. Electronically Signed   By: Suzy Bouchard M.D.   On: 08/10/2017 10:44   Nm Bone Scan Whole Body  Result Date: 08/10/2017 CLINICAL DATA:  Metastatic melanoma, LEFT femoral fracture with rod placement in 2015 and subsequent hardware removal in 2016 EXAM: [place]: Whole body anterior and posterior images were obtained approximately 3 hours after intravenous injection of radiopharmaceutical. Spot views of the thorax and pelvis were also obtained. RADIOPHARMACEUTICALS:  22.28 mCi Technetium-16mMDP IV COMPARISON:  PET-CT 05/09/2017 Radiographic correlation: CT chest abdomen pelvis 08/10/2017 FINDINGS: Uptake at the shoulders, RIGHT hip, knees, feet, RIGHT elbow and  RIGHT wrist, typically degenerative. Uptake at the LEFT hip joint predominately within the acetabulum extending to ischium, with no focal bone lesion seen at this site on CT. Uptake at the posterior LEFT eleventh rib noted, corresponding to a healing fracture on CT. Mild increased tracer localization at the mid LEFT femoral diaphysis. No additional sites of abnormal osseous tracer accumulation are identified. Expected urinary tract and soft tissue distribution of tracer. No additional sites IMPRESSION: Uptake at healing fracture posterior LEFT eleventh rib. Scattered degenerative type uptake. More pronounced uptake is seen within the LEFT acetabulum extending towards the LEFT ischium, of uncertain etiology, could potentially be degenerative or metastatic in origin; no focal osseous abnormalities are identified by CT. Uptake at mid LEFT femoral diaphysis, question site of  prior reported trauma, without radiographic correlation available; recommend correlation with patient history and prior outside exams. Electronically Signed   By: Lavonia Dana M.D.   On: 08/10/2017 13:42    ONCOLOGIC HISTORY: Patient initially diagnosed with metastatic melanoma to right thigh with unknown primary in 2009. She received 4 weeks of high-dose interferon therapy in February 2009 and proceed with low-dose interferon therapy in March 2009. Patient remained in remission until August 2012 when she was noted to have recurrent melanoma and a pelvic lymph node. October 2012 she had 2 cycles of Ipilumimab which was discontinued secondary to poor tolerance. In April 2017 PET scan showed recurrent disease in the right inguinal region which was confirmed by biopsy. Zeboraf discontinued in November 2017 secondary to intolerable rash. Patient initiated combination immunotherapy with nivolumab and ipilumimab on January 14, 2016. Patient initiated maintenance nivolumab on April 14, 2016 and discontinued on November 07, 2016.  Nivolumab was  discontinued secondary to persistent diarrhea.  Single agent oral Mekinist from December 07, 2016 through December 2018.  ASSESSMENT: Recurrent metastatic melanoma, BRAF+, of right lower extremity, metastatic.  PLAN:    1. Recurrent metastatic melanoma, BRAF+, of right lower extremity, metastatic: See oncology treatment history above. She completed palliative XRT to her iliac crest lesion on August 11, 2016. CT and bone scan results from August 10, 2017 reviewed independently and reported as above revealing no evidence of recurrent or progressive disease.  No treatments are needed at this time.  She discontinued 2 mg trametinib (Mekinist) daily secondary to rash. She expressed understanding she is at risk for recurrence.  If this occurs, will likely reinitiate Mekinist. Can also consider combination therapy using dabrafenib/trametinib in the future. Single agent Cobimetinib is also an option.  Per patient's insurance, no further PET scans will be covered.  Return to clinic in 3 months for further evaluation and repeat imaging with CT scan and bone scan.  2. Pain: Resolved. Patient states she has discontinued Plaquenil.  She no longer takes hydrocodone.   3. Nausea: Resolved.  Continue Compazine as needed. 4. Diarrhea: Resolved with Remicade. At least grade 3 and refractory to steroids. Colonoscopy and EGD were unrevealing.  Continue symptomatic treatment for loose stools. 5. Pancreatitis: Resolved.  Unclear etiology. Continue follow-up with GI as indicated. 6. Elevated creatinine: Patient's creatinine continues to be mildly elevated.  Continue monitoring and treatment per nephrology.   7.  Rash: Resolved.  Likely secondary to Mekinist.  Previously, unresponsive to steroid taper and topical treatments.    I spent a total of 30 minutes face-to-face with the patient of which greater than 50% of the visit was spent in counseling and coordination of care as summarized above.   Patient expressed understanding  and was in agreement with this plan. She also understands that She can call clinic at any time with any questions, concerns, or complaints.    Lloyd Huger, MD 08/19/17 6:23 AM

## 2017-08-14 ENCOUNTER — Other Ambulatory Visit: Payer: Self-pay

## 2017-08-14 ENCOUNTER — Inpatient Hospital Stay: Payer: Medicare HMO | Admitting: Oncology

## 2017-08-14 VITALS — BP 132/86 | HR 63 | Temp 96.9°F | Resp 18 | Wt 142.8 lb

## 2017-08-14 DIAGNOSIS — M810 Age-related osteoporosis without current pathological fracture: Secondary | ICD-10-CM

## 2017-08-14 DIAGNOSIS — Z85820 Personal history of malignant melanoma of skin: Secondary | ICD-10-CM

## 2017-08-14 DIAGNOSIS — I509 Heart failure, unspecified: Secondary | ICD-10-CM | POA: Diagnosis not present

## 2017-08-14 DIAGNOSIS — E230 Hypopituitarism: Secondary | ICD-10-CM

## 2017-08-14 DIAGNOSIS — R630 Anorexia: Secondary | ICD-10-CM | POA: Diagnosis not present

## 2017-08-14 DIAGNOSIS — K219 Gastro-esophageal reflux disease without esophagitis: Secondary | ICD-10-CM

## 2017-08-14 DIAGNOSIS — I429 Cardiomyopathy, unspecified: Secondary | ICD-10-CM

## 2017-08-14 DIAGNOSIS — R5383 Other fatigue: Secondary | ICD-10-CM

## 2017-08-14 DIAGNOSIS — R590 Localized enlarged lymph nodes: Secondary | ICD-10-CM | POA: Diagnosis not present

## 2017-08-14 DIAGNOSIS — E2740 Unspecified adrenocortical insufficiency: Secondary | ICD-10-CM | POA: Diagnosis not present

## 2017-08-14 DIAGNOSIS — E039 Hypothyroidism, unspecified: Secondary | ICD-10-CM | POA: Diagnosis not present

## 2017-08-14 DIAGNOSIS — C799 Secondary malignant neoplasm of unspecified site: Secondary | ICD-10-CM

## 2017-08-14 DIAGNOSIS — Z79899 Other long term (current) drug therapy: Secondary | ICD-10-CM

## 2017-08-14 DIAGNOSIS — E2749 Other adrenocortical insufficiency: Secondary | ICD-10-CM | POA: Diagnosis not present

## 2017-08-14 DIAGNOSIS — F17210 Nicotine dependence, cigarettes, uncomplicated: Secondary | ICD-10-CM

## 2017-08-14 DIAGNOSIS — C439 Malignant melanoma of skin, unspecified: Secondary | ICD-10-CM

## 2017-08-14 DIAGNOSIS — R531 Weakness: Secondary | ICD-10-CM

## 2017-08-14 DIAGNOSIS — I700 Atherosclerosis of aorta: Secondary | ICD-10-CM

## 2017-08-14 DIAGNOSIS — E038 Other specified hypothyroidism: Secondary | ICD-10-CM | POA: Diagnosis not present

## 2017-08-14 NOTE — Progress Notes (Signed)
Per  Pt still having up to 18 loose- mildly formed stools per day. ( went on vacation to [facility] June 9 ) still feeling tired ," worn out " tried c pap without change in energy.  Stated she lays around many days. Intermittent good days.

## 2017-08-23 DIAGNOSIS — E230 Hypopituitarism: Secondary | ICD-10-CM | POA: Diagnosis not present

## 2017-08-23 DIAGNOSIS — E2749 Other adrenocortical insufficiency: Secondary | ICD-10-CM | POA: Diagnosis not present

## 2017-08-23 DIAGNOSIS — R5382 Chronic fatigue, unspecified: Secondary | ICD-10-CM | POA: Diagnosis not present

## 2017-08-23 DIAGNOSIS — R69 Illness, unspecified: Secondary | ICD-10-CM | POA: Diagnosis not present

## 2017-08-23 DIAGNOSIS — Z7952 Long term (current) use of systemic steroids: Secondary | ICD-10-CM | POA: Diagnosis not present

## 2017-08-23 DIAGNOSIS — E038 Other specified hypothyroidism: Secondary | ICD-10-CM | POA: Diagnosis not present

## 2017-08-24 DIAGNOSIS — L299 Pruritus, unspecified: Secondary | ICD-10-CM | POA: Diagnosis not present

## 2017-08-24 DIAGNOSIS — R69 Illness, unspecified: Secondary | ICD-10-CM | POA: Diagnosis not present

## 2017-08-24 DIAGNOSIS — R269 Unspecified abnormalities of gait and mobility: Secondary | ICD-10-CM | POA: Diagnosis not present

## 2017-08-24 DIAGNOSIS — E039 Hypothyroidism, unspecified: Secondary | ICD-10-CM | POA: Diagnosis not present

## 2017-08-25 ENCOUNTER — Encounter: Payer: Self-pay | Admitting: Oncology

## 2017-09-01 DIAGNOSIS — W19XXXA Unspecified fall, initial encounter: Secondary | ICD-10-CM | POA: Diagnosis not present

## 2017-09-01 DIAGNOSIS — S8002XA Contusion of left knee, initial encounter: Secondary | ICD-10-CM | POA: Diagnosis not present

## 2017-09-04 ENCOUNTER — Other Ambulatory Visit: Payer: Self-pay | Admitting: Oncology

## 2017-09-06 ENCOUNTER — Encounter: Payer: Self-pay | Admitting: *Deleted

## 2017-09-06 ENCOUNTER — Telehealth: Payer: Self-pay | Admitting: Internal Medicine

## 2017-09-06 ENCOUNTER — Ambulatory Visit: Payer: Medicare HMO | Admitting: Internal Medicine

## 2017-09-06 NOTE — Telephone Encounter (Signed)
Patient decided to cancel appt

## 2017-09-06 NOTE — Telephone Encounter (Signed)
Patient returning call.

## 2017-09-06 NOTE — Telephone Encounter (Signed)
Attempted to call patient again. My chart message has been sent to patient.

## 2017-09-06 NOTE — Progress Notes (Deleted)
[facility] Consultation      Assessment and Plan:  Excessive daytime sleepiness, excessive fatigue prior history of obstructive sleep apnea. Insomnia, currently on Klonopin -Patient opted in the past not to start CPAP, will need to order a new sleep study in order to qualify her for CPAP. - Patient's fatigue may otherwise be multifactorial secondary to chronic disease from her cancer and chemotherapy, as well as chronic Klonopin use.  Will reassess once patient has been started on CPAP if needed.  Addendum 06/14/17; Outside baseline sleep study 04/30/14; AHI was 9.1, c/w mild OSA, PLMI 37 with arousal index of 3.3 Cpap titration 06/17/14; PLMI of 32, arousal index of 3.7. Required CPAP at pressure of 6.  Nicotine abuse. -Recommend cessation.  Date: 09/06/2017  MRN# [id number] Patricia Li 06-Nov-1960  Referring Physician: Dr. Manuella Ghazi.   Patricia Li is a 57 y.o. old female seen in consultation for chief complaint of:    No chief complaint on file.   HPI:   Patient is a 57 year old female presents for evaluation of obstructive sleep apnea.  Her history includes hypertension, hypothyroidism, GERD, depression, congestive heart failure with cardiomyopathy, bipolar disorder and adrenal insufficiency and recurrent metastatic melanoma.   She was diagnosed with OSA about 5 years ago, she had apnea when she was laying on her back. At last visit she was interested in restarting cpap. Insurance required a new sleep study, which showed mild OSA. She was started on Auto-PAP with pressure range of 5-15 cm H2O. She notes that she does not sleep on her back. She takes klonopin at night because she otherwise can not sleep through the night.  She is always sleepy and tired during the day, but she is on treatment for metastatic melanoma. Her father had OSA and CHF.   **HST 07/05/17>> AHI 6. **Outside baseline sleep study 04/30/14>>AHI was 9.1, c/w mild OSA, PLMI 37 with arousal index  of 3.3 **Cpap titration 06/17/14 >>PLMI of 32, arousal index of 3.7. Required CPAP at pressure of 6.  Social Hx:   Social History   Tobacco Use  . Smoking status: Current Every Day Smoker    Packs/day: 0.50    Years: 20.00    Pack years: 10.00    Types: Cigarettes  . Smokeless tobacco: Never Used  Substance Use Topics  . Alcohol use: Yes    Alcohol/week: 0.0 oz    Comment: ocassionally  . Drug use: No   Medication:    Current Outpatient Medications:  .  calcitRIOL (ROCALTROL) 0.25 MCG capsule, Take 0.25 mcg by mouth daily., Disp: , Rfl: 6 .  calcium carbonate (OS-CAL) 600 MG TABS tablet, Take 600 mg by mouth 2 (two) times daily with a meal., Disp: , Rfl:  .  cetirizine (ZYRTEC ALLERGY) 10 MG tablet, Take 10 mg by mouth daily., Disp: , Rfl:  .  clonazePAM (KLONOPIN) 0.5 MG tablet, Take 0.5 mg by mouth at bedtime., Disp: , Rfl:  .  cyanocobalamin (,VITAMIN B-12,) 1000 MCG/ML injection, INJECT 1ML INTO THE MUSCLE MONTHLY, Disp: , Rfl: 6 .  famotidine (PEPCID) 40 MG tablet, Take 40 mg by mouth at bedtime., Disp: , Rfl:  .  furosemide (LASIX) 20 MG tablet, Take 20 mg by mouth as needed. , Disp: , Rfl:  .  furosemide (LASIX) 20 MG tablet, TAKE 1 TABLET BY MOUTH EVERY DAY, Disp: , Rfl:  .  gabapentin (NEURONTIN) 300 MG capsule, Take 300 mg by mouth 3 (three) times daily., Disp: , Rfl:  .  hydrocortisone (CORTEF) 10 MG tablet, Take 10-15 mg by mouth 2 (two) times daily. 1.5 tablets (15 MG) in the morning and 1 tablet (10 MG) in the evening, Disp: , Rfl:  .  hydrOXYzine (ATARAX/VISTARIL) 25 MG tablet, Take 25 mg by mouth daily., Disp: , Rfl: 2 .  levothyroxine (SYNTHROID, LEVOTHROID) 137 MCG tablet, Take 137 mcg by mouth daily., Disp: , Rfl:  .  Metoclopramide HCl (REGLAN PO), Take by mouth., Disp: , Rfl:  .  metoprolol tartrate (LOPRESSOR) 25 MG tablet, Take 12.5 mg by mouth 2 (two) times daily. , Disp: , Rfl:  .  montelukast (SINGULAIR) 10 MG tablet, Take 10 mg by mouth at bedtime., Disp:  , Rfl:  .  Multiple Vitamin (MULTIVITAMIN WITH MINERALS) TABS tablet, Take 1 tablet by mouth daily., Disp: , Rfl:  .  ondansetron (ZOFRAN) 4 MG tablet, Take 4 mg by mouth every 8 (eight) hours as needed for nausea or vomiting., Disp: , Rfl:  .  pantoprazole (PROTONIX) 40 MG tablet, TAKE 1 TABLET BY MOUTH TWICE A DAY, Disp: 60 tablet, Rfl: 3 .  prochlorperazine (COMPAZINE) 10 MG tablet, Take 1 tablet (10 mg total) by mouth every 6 (six) hours as needed for nausea or vomiting. (Patient not taking: Reported on 08/14/2017), Disp: 60 tablet, Rfl: 1 .  venlafaxine XR (EFFEXOR-XR) 150 MG 24 hr capsule, 150 mg every morning. , Disp: , Rfl:  .  venlafaxine XR (EFFEXOR-XR) 75 MG 24 hr capsule, Take 75 mg by mouth every evening., Disp: , Rfl:    Allergies:  Clindamycin; Levaquin [levofloxacin]; Aripiprazole; Depakote [divalproex sodium]; Valproic acid; Ondansetron hcl; and Promethazine     LABORATORY PANEL:   CBC No results for input(s): WBC, HGB, HCT, PLT in the last 168 hours. ------------------------------------------------------------------------------------------------------------------  Chemistries  No results for input(s): NA, K, CL, CO2, GLUCOSE, BUN, CREATININE, CALCIUM, MG, AST, ALT, ALKPHOS, BILITOT in the last 168 hours.  Invalid input(s): GFRCGP ------------------------------------------------------------------------------------------------------------------  Cardiac Enzymes No results for input(s): TROPONINI in the last 168 hours. ------------------------------------------------------------  RADIOLOGY:  No results found.     Thank  you for the consultation and for allowing [facility] to assist in the care of your patient. Our recommendations are noted above.  Please contact us if we can be of further service.   Marda Stalker, MD.  Board Certified in Internal Medicine, Pulmonary Medicine, [place], and Sleep Medicine.    Pulmonary and Critical Care Office Number: [phone]  Patricia Pesa, M.D.  Merton Border, M.D  09/06/2017

## 2017-09-06 NOTE — Telephone Encounter (Signed)
Patient has not been able to meet the medicare guidelines for cpap compliance and had to turn in machine  She wants to know if she still needs to do her fu on Friday 7/12 .    Patient also has an older cpap that her dad had that she would like to know if she woukld be able to have the old one reprogrammed.    Please call to advise .

## 2017-09-06 NOTE — Telephone Encounter (Signed)
Left message for pt to return call. Pt preference if she wants to keep follow up or not.

## 2017-09-08 ENCOUNTER — Ambulatory Visit: Payer: Medicare HMO | Admitting: Internal Medicine

## 2017-09-11 DIAGNOSIS — C799 Secondary malignant neoplasm of unspecified site: Secondary | ICD-10-CM | POA: Diagnosis not present

## 2017-09-11 DIAGNOSIS — K8681 Exocrine pancreatic insufficiency: Secondary | ICD-10-CM | POA: Diagnosis not present

## 2017-09-15 DIAGNOSIS — N179 Acute kidney failure, unspecified: Secondary | ICD-10-CM | POA: Diagnosis not present

## 2017-09-15 DIAGNOSIS — R600 Localized edema: Secondary | ICD-10-CM | POA: Diagnosis not present

## 2017-09-15 DIAGNOSIS — N2581 Secondary hyperparathyroidism of renal origin: Secondary | ICD-10-CM | POA: Diagnosis not present

## 2017-09-15 DIAGNOSIS — N183 Chronic kidney disease, stage 3 (moderate): Secondary | ICD-10-CM | POA: Diagnosis not present

## 2017-09-15 DIAGNOSIS — M2241 Chondromalacia patellae, right knee: Secondary | ICD-10-CM | POA: Diagnosis not present

## 2017-09-15 DIAGNOSIS — M170 Bilateral primary osteoarthritis of knee: Secondary | ICD-10-CM | POA: Diagnosis not present

## 2017-09-15 DIAGNOSIS — M2242 Chondromalacia patellae, left knee: Secondary | ICD-10-CM | POA: Diagnosis not present

## 2017-09-18 DIAGNOSIS — M62830 Muscle spasm of back: Secondary | ICD-10-CM | POA: Diagnosis not present

## 2017-09-19 DIAGNOSIS — M2242 Chondromalacia patellae, left knee: Secondary | ICD-10-CM | POA: Diagnosis not present

## 2017-09-19 DIAGNOSIS — M2241 Chondromalacia patellae, right knee: Secondary | ICD-10-CM | POA: Diagnosis not present

## 2017-09-19 DIAGNOSIS — M170 Bilateral primary osteoarthritis of knee: Secondary | ICD-10-CM | POA: Diagnosis not present

## 2017-09-26 DIAGNOSIS — M25511 Pain in right shoulder: Secondary | ICD-10-CM | POA: Diagnosis not present

## 2017-09-26 DIAGNOSIS — M170 Bilateral primary osteoarthritis of knee: Secondary | ICD-10-CM | POA: Diagnosis not present

## 2017-09-26 DIAGNOSIS — M2242 Chondromalacia patellae, left knee: Secondary | ICD-10-CM | POA: Diagnosis not present

## 2017-09-26 DIAGNOSIS — M2241 Chondromalacia patellae, right knee: Secondary | ICD-10-CM | POA: Diagnosis not present

## 2017-09-27 ENCOUNTER — Inpatient Hospital Stay: Payer: Medicare HMO | Attending: Oncology

## 2017-09-27 DIAGNOSIS — Z452 Encounter for adjustment and management of vascular access device: Secondary | ICD-10-CM | POA: Diagnosis not present

## 2017-09-27 DIAGNOSIS — Z85820 Personal history of malignant melanoma of skin: Secondary | ICD-10-CM | POA: Insufficient documentation

## 2017-09-27 DIAGNOSIS — Z95828 Presence of other vascular implants and grafts: Secondary | ICD-10-CM

## 2017-09-27 MED ORDER — HEPARIN SOD (PORK) LOCK FLUSH 100 UNIT/ML IV SOLN
500.0000 [IU] | Freq: Once | INTRAVENOUS | Status: AC
  Administered 2017-09-27: 500 [IU] via INTRAVENOUS

## 2017-09-27 MED ORDER — SODIUM CHLORIDE 0.9% FLUSH
10.0000 mL | Freq: Once | INTRAVENOUS | Status: AC
  Administered 2017-09-27: 10 mL via INTRAVENOUS
  Filled 2017-09-27: qty 10

## 2017-09-28 ENCOUNTER — Telehealth: Payer: Self-pay

## 2017-09-28 NOTE — Telephone Encounter (Signed)
Called patient x 2 regarding amended CT abdomen and pelvis report.  Left message for patient to return call.

## 2017-10-03 DIAGNOSIS — M2242 Chondromalacia patellae, left knee: Secondary | ICD-10-CM | POA: Diagnosis not present

## 2017-10-03 DIAGNOSIS — M170 Bilateral primary osteoarthritis of knee: Secondary | ICD-10-CM | POA: Diagnosis not present

## 2017-10-03 DIAGNOSIS — M2241 Chondromalacia patellae, right knee: Secondary | ICD-10-CM | POA: Diagnosis not present

## 2017-10-10 DIAGNOSIS — M2242 Chondromalacia patellae, left knee: Secondary | ICD-10-CM | POA: Diagnosis not present

## 2017-10-10 DIAGNOSIS — M170 Bilateral primary osteoarthritis of knee: Secondary | ICD-10-CM | POA: Diagnosis not present

## 2017-10-10 DIAGNOSIS — M2241 Chondromalacia patellae, right knee: Secondary | ICD-10-CM | POA: Diagnosis not present

## 2017-10-11 ENCOUNTER — Encounter
Admission: RE | Admit: 2017-10-11 | Discharge: 2017-10-11 | Disposition: A | Discharge status: Home / Self Care | Payer: Medicare HMO | Source: Ambulatory Visit | Attending: Oncology | Admitting: Oncology

## 2017-10-11 ENCOUNTER — Ambulatory Visit
Admission: RE | Admit: 2017-10-11 | Discharge: 2017-10-11 | Disposition: A | Discharge status: Home / Self Care | Payer: Medicare HMO | Source: Ambulatory Visit | Attending: Oncology | Admitting: Oncology

## 2017-10-11 ENCOUNTER — Inpatient Hospital Stay: Payer: Medicare HMO | Attending: Oncology

## 2017-10-11 ENCOUNTER — Other Ambulatory Visit: Payer: Self-pay | Admitting: Oncology

## 2017-10-11 DIAGNOSIS — F319 Bipolar disorder, unspecified: Secondary | ICD-10-CM | POA: Diagnosis not present

## 2017-10-11 DIAGNOSIS — C799 Secondary malignant neoplasm of unspecified site: Secondary | ICD-10-CM

## 2017-10-11 DIAGNOSIS — Z85820 Personal history of malignant melanoma of skin: Secondary | ICD-10-CM | POA: Insufficient documentation

## 2017-10-11 DIAGNOSIS — E230 Hypopituitarism: Secondary | ICD-10-CM | POA: Insufficient documentation

## 2017-10-11 DIAGNOSIS — I110 Hypertensive heart disease with heart failure: Secondary | ICD-10-CM | POA: Diagnosis not present

## 2017-10-11 DIAGNOSIS — I509 Heart failure, unspecified: Secondary | ICD-10-CM | POA: Diagnosis not present

## 2017-10-11 DIAGNOSIS — R944 Abnormal results of kidney function studies: Secondary | ICD-10-CM | POA: Insufficient documentation

## 2017-10-11 DIAGNOSIS — Z79899 Other long term (current) drug therapy: Secondary | ICD-10-CM | POA: Insufficient documentation

## 2017-10-11 DIAGNOSIS — I429 Cardiomyopathy, unspecified: Secondary | ICD-10-CM | POA: Diagnosis not present

## 2017-10-11 DIAGNOSIS — E2740 Unspecified adrenocortical insufficiency: Secondary | ICD-10-CM | POA: Diagnosis not present

## 2017-10-11 DIAGNOSIS — C779 Secondary and unspecified malignant neoplasm of lymph node, unspecified: Secondary | ICD-10-CM

## 2017-10-11 DIAGNOSIS — F17210 Nicotine dependence, cigarettes, uncomplicated: Secondary | ICD-10-CM | POA: Insufficient documentation

## 2017-10-11 DIAGNOSIS — Z923 Personal history of irradiation: Secondary | ICD-10-CM | POA: Insufficient documentation

## 2017-10-11 DIAGNOSIS — I2510 Atherosclerotic heart disease of native coronary artery without angina pectoris: Secondary | ICD-10-CM | POA: Diagnosis not present

## 2017-10-11 DIAGNOSIS — E039 Hypothyroidism, unspecified: Secondary | ICD-10-CM | POA: Insufficient documentation

## 2017-10-11 DIAGNOSIS — R634 Abnormal weight loss: Secondary | ICD-10-CM | POA: Diagnosis not present

## 2017-10-11 DIAGNOSIS — C439 Malignant melanoma of skin, unspecified: Secondary | ICD-10-CM

## 2017-10-11 DIAGNOSIS — N3281 Overactive bladder: Secondary | ICD-10-CM | POA: Insufficient documentation

## 2017-10-11 DIAGNOSIS — R69 Illness, unspecified: Secondary | ICD-10-CM | POA: Diagnosis not present

## 2017-10-11 DIAGNOSIS — K219 Gastro-esophageal reflux disease without esophagitis: Secondary | ICD-10-CM | POA: Insufficient documentation

## 2017-10-11 DIAGNOSIS — Z9221 Personal history of antineoplastic chemotherapy: Secondary | ICD-10-CM | POA: Insufficient documentation

## 2017-10-11 DIAGNOSIS — I700 Atherosclerosis of aorta: Secondary | ICD-10-CM | POA: Diagnosis not present

## 2017-10-11 DIAGNOSIS — M810 Age-related osteoporosis without current pathological fracture: Secondary | ICD-10-CM | POA: Diagnosis not present

## 2017-10-11 DIAGNOSIS — Z800 Family history of malignant neoplasm of digestive organs: Secondary | ICD-10-CM | POA: Diagnosis not present

## 2017-10-11 LAB — CBC WITH DIFFERENTIAL/PLATELET
BASOS PCT: 1 %
Basophils Absolute: 0.1 10*3/uL (ref 0–0.1)
EOS ABS: 0.2 10*3/uL (ref 0–0.7)
Eosinophils Relative: 3 %
HCT: 36.5 % (ref 35.0–47.0)
HEMOGLOBIN: 12.2 g/dL (ref 12.0–16.0)
LYMPHS ABS: 0.9 10*3/uL — AB (ref 1.0–3.6)
Lymphocytes Relative: 12 %
MCH: 32.7 pg (ref 26.0–34.0)
MCHC: 33.6 g/dL (ref 32.0–36.0)
MCV: 97.5 fL (ref 80.0–100.0)
Monocytes Absolute: 0.4 10*3/uL (ref 0.2–0.9)
Monocytes Relative: 5 %
NEUTROS PCT: 79 %
Neutro Abs: 6.3 10*3/uL (ref 1.4–6.5)
Platelets: 273 10*3/uL (ref 150–440)
RBC: 3.74 MIL/uL — AB (ref 3.80–5.20)
RDW: 12.7 % (ref 11.5–14.5)
WBC: 8 10*3/uL (ref 3.6–11.0)

## 2017-10-11 LAB — COMPREHENSIVE METABOLIC PANEL
ALT: 14 U/L (ref 0–44)
ANION GAP: 9 (ref 5–15)
AST: 25 U/L (ref 15–41)
Albumin: 3.9 g/dL (ref 3.5–5.0)
Alkaline Phosphatase: 61 U/L (ref 38–126)
BUN: 14 mg/dL (ref 6–20)
CHLORIDE: 105 mmol/L (ref 98–111)
CO2: 25 mmol/L (ref 22–32)
CREATININE: 1.95 mg/dL — AB (ref 0.44–1.00)
Calcium: 8.7 mg/dL — ABNORMAL LOW (ref 8.9–10.3)
GFR, EST AFRICAN AMERICAN: 32 mL/min — AB (ref >60)
GFR, EST NON AFRICAN AMERICAN: 28 mL/min — AB (ref >60)
Glucose, Bld: 94 mg/dL (ref 70–99)
Potassium: 4.2 mmol/L (ref 3.5–5.1)
Sodium: 139 mmol/L (ref 135–145)
Total Bilirubin: 0.4 mg/dL (ref 0.3–1.2)
Total Protein: 6.3 g/dL — ABNORMAL LOW (ref 6.5–8.1)

## 2017-10-11 LAB — PHOSPHORUS: PHOSPHORUS: 3.1 mg/dL (ref 2.5–4.6)

## 2017-10-11 LAB — MAGNESIUM: Magnesium: 2.2 mg/dL (ref 1.7–2.4)

## 2017-10-11 MED ORDER — IOHEXOL 300 MG/ML  SOLN
100.0000 mL | Freq: Once | INTRAMUSCULAR | Status: AC | PRN
  Administered 2017-10-11: 100 mL via INTRAVENOUS

## 2017-10-11 MED ORDER — TECHNETIUM TC 99M MEDRONATE IV KIT
20.0000 | PACK | Freq: Once | INTRAVENOUS | Status: AC | PRN
  Administered 2017-10-11: 23.581 via INTRAVENOUS

## 2017-10-15 NOTE — Progress Notes (Signed)
[place]  [phone] [phone]  ID: Patricia Li OB: 1960/05/26  MR#: [id number]  QIH#:[id number]  Patient Care Team: System, Pcp Not In as PCP - General Byrnett, Forest Gleason, MD as Consulting Physician (General Surgery) Forest Gleason, MD (Inactive) (Oncology) Lloyd Huger, MD as Consulting Physician (Oncology)  CHIEF COMPLAINT: Recurrent metastatic melanoma, BRAF+, of right lower extremity, metastatic.  INTERVAL HISTORY: Patient returns to clinic today for further evaluation and discussion of her imaging results.  She currently feels well and is asymptomatic.  She does not complain of weakness or fatigue.  She has no neurologic complaints. She denies any recent fevers or illnesses. She has a good appetite. She denies any chest pain or shortness of breath. She has no nausea, vomiting, diarrhea, or constipation. She denies any joint pain.  She has no urinary complaints.  Patient feels at her baseline offers no specific complaints today.  REVIEW OF SYSTEMS:   Review of Systems  Constitutional: Negative.  Negative for fever, malaise/fatigue and weight loss.  HENT: Negative.  Negative for ear pain and tinnitus.   Eyes: Negative.   Respiratory: Negative.  Negative for cough and shortness of breath.   Cardiovascular: Negative.  Negative for chest pain and leg swelling.  Gastrointestinal: Negative for abdominal pain, blood in stool, diarrhea, heartburn and melena.  Genitourinary: Negative.  Negative for dysuria.  Musculoskeletal: Negative.  Negative for back pain and joint pain.  Skin: Negative.  Negative for itching and rash.  Neurological: Negative.  Negative for sensory change, focal weakness and weakness.  Endo/Heme/Allergies: Does not bruise/bleed easily.  Psychiatric/Behavioral: Negative.  Negative for depression. The patient is not nervous/anxious and does not have insomnia.     As per HPI. Otherwise, a complete review of systems is  negative.  PAST MEDICAL HISTORY: Past Medical History:  Diagnosis Date  . Adrenal insufficiency ([place])   . Bipolar disorder ([place])   . Cardiomyopathy ([place])   . CHF (congestive heart failure) ([place]) 2016   Dr. Nehemiah Massed  . Depression   . GERD (gastroesophageal reflux disease) 2012  . Hypertension   . Hypothyroidism   . Joint pain   . Lumbar herniated disc 2010  . Malignant melanoma of skin of lower limb, including hip ([place]) 2008, 2017   right thigh/2017- stage 4  . Osteoporosis, postmenopausal   . Overactive bladder   . Pancreatitis   . Panhypopituitarism ([place])   . Personal history of chemotherapy 2008   melonoma  . Personal history of malignant melanoma of skin 2012  . Personal history of radiation therapy 2017   melonoma  . Rectal bleeding   . Supraglottitis   . Tendonitis   . Tick bite of head 07/20/2015  . Weight loss, unintentional     PAST SURGICAL HISTORY: Past Surgical History:  Procedure Laterality Date  . BACK SURGERY  2010  . BREAST CYST EXCISION  1985  . COLONOSCOPY  07/2012   [facility]  . COLONOSCOPY WITH PROPOFOL N/A 11/21/2016   Procedure: COLONOSCOPY WITH PROPOFOL;  Surgeon: Manya Silvas, MD;  Location: [facility] ENDOSCOPY;  Service: Endoscopy;  Laterality: N/A;  . ESOPHAGOGASTRODUODENOSCOPY    . ESOPHAGOGASTRODUODENOSCOPY (EGD) WITH PROPOFOL N/A 11/21/2016   Procedure: ESOPHAGOGASTRODUODENOSCOPY (EGD) WITH PROPOFOL;  Surgeon: Manya Silvas, MD;  Location: [facility] ENDOSCOPY;  Service: Endoscopy;  Laterality: N/A;  . FRACTURE SURGERY     rod removed from left femur 07/21/2014  . INGUINAL LYMPH NODE BIOPSY Right 07/17/2015   Procedure: INGUINAL LYMPH NODE BIOPSY/EXCISION;  Surgeon: Robert Bellow, MD;  Location: [facility] ORS;  Service: General;  Laterality: Right;  . laminnotomy reexploration posterior lumbar   12/26/2008   with nerve decomp and discectomy  . MELANOMA EXCISION  2008   right thigh  . PORTACATH PLACEMENT Left 03/14/2016   Procedure: INSERTION  PORT-A-CATH;  Surgeon: Robert Bellow, MD;  Location: [facility] ORS;  Service: General;  Laterality: Left;  . SKIN LESION EXCISION Right 12-19-10   posterior  . SPINE SURGERY      FAMILY HISTORY: Family History  Problem Relation Age of Onset  . COPD Father   . Cancer Sister        Colon, sister       ADVANCED DIRECTIVES:    HEALTH MAINTENANCE: Social History   Tobacco Use  . Smoking status: Current Every Day Smoker    Packs/day: 0.50    Years: 20.00    Pack years: 10.00    Types: Cigarettes  . Smokeless tobacco: Never Used  Substance Use Topics  . Alcohol use: Yes    Alcohol/week: 0.0 standard drinks    Comment: ocassionally  . Drug use: No     Colonoscopy:  PAP:  Bone density:  Lipid panel:  Allergies  Allergen Reactions  . Clindamycin Shortness Of Breath  . Levaquin [Levofloxacin] Other (See Comments)    Joint inflammation  . Aripiprazole Other (See Comments)    Facial and head ticks  . Depakote [Divalproex Sodium] Other (See Comments)  . Valproic Acid     Other reaction(s): Other (See Comments) Facial twitching  . Ondansetron Hcl Nausea And Vomiting  . Promethazine Nausea And Vomiting    Current Outpatient Medications  Medication Sig Dispense Refill  . calcitRIOL (ROCALTROL) 0.25 MCG capsule Take 0.25 mcg by mouth daily.  6  . calcium carbonate (OS-CAL) 600 MG TABS tablet Take 600 mg by mouth 2 (two) times daily with a meal.    . cetirizine (ZYRTEC ALLERGY) 10 MG tablet Take 10 mg by mouth daily.    . clonazePAM (KLONOPIN) 0.5 MG tablet Take 0.5 mg by mouth at bedtime.    . cyanocobalamin (,VITAMIN B-12,) 1000 MCG/ML injection INJECT 1ML INTO THE MUSCLE MONTHLY  6  . famotidine (PEPCID) 40 MG tablet Take 40 mg by mouth at bedtime.    . furosemide (LASIX) 20 MG tablet Take 20 mg by mouth as needed.     . furosemide (LASIX) 20 MG tablet TAKE 1 TABLET BY MOUTH EVERY DAY    . gabapentin (NEURONTIN) 300 MG capsule Take 300 mg by mouth 3 (three) times  daily.    . hydrocortisone (CORTEF) 10 MG tablet Take 10-15 mg by mouth 2 (two) times daily. 1.5 tablets (15 MG) in the morning and 1 tablet (10 MG) in the evening    . hydrOXYzine (ATARAX/VISTARIL) 25 MG tablet Take 25 mg by mouth daily.  2  . levothyroxine (SYNTHROID, LEVOTHROID) 137 MCG tablet Take 137 mcg by mouth daily.    . Metoclopramide HCl (REGLAN PO) Take by mouth.    . metoprolol tartrate (LOPRESSOR) 25 MG tablet Take 12.5 mg by mouth 2 (two) times daily.     . montelukast (SINGULAIR) 10 MG tablet Take 10 mg by mouth at bedtime.    . Multiple Vitamin (MULTIVITAMIN WITH MINERALS) TABS tablet Take 1 tablet by mouth daily.    . ondansetron (ZOFRAN) 4 MG tablet Take 4 mg by mouth every 8 (eight) hours as needed for nausea or vomiting.    Marland Kitchen  pantoprazole (PROTONIX) 40 MG tablet TAKE 1 TABLET BY MOUTH TWICE A DAY 60 tablet 3  . prochlorperazine (COMPAZINE) 10 MG tablet Take 1 tablet (10 mg total) by mouth every 6 (six) hours as needed for nausea or vomiting. 60 tablet 1  . venlafaxine XR (EFFEXOR-XR) 150 MG 24 hr capsule 150 mg every morning.     . venlafaxine XR (EFFEXOR-XR) 75 MG 24 hr capsule Take 75 mg by mouth every evening.     No current facility-administered medications for this visit.     OBJECTIVE: Vitals:   10/16/17 1038 10/16/17 1043  BP:  (!) 154/87  Pulse:  (!) 57  Resp: 12   Temp:  97.7 F (36.5 C)     Body mass index is 22.43 kg/m.    ECOG FS:0 - Asymptomatic  General: Well-developed, well-nourished, no acute distress. Eyes: Pink conjunctiva, anicteric sclera. HEENT: Normocephalic, moist mucous membranes, clear oropharnyx. Lungs: Clear to auscultation bilaterally. Heart: Regular rate and rhythm. No rubs, murmurs, or gallops. Abdomen: Soft, nontender, nondistended. No organomegaly noted, normoactive bowel sounds. Musculoskeletal: No edema, cyanosis, or clubbing. Neuro: Alert, answering all questions appropriately. Cranial nerves grossly intact. Skin: No rashes  or petechiae noted. Psych: Normal affect. Lymphatics: No cervical, calvicular, axillary or inguinal LAD.   LAB RESULTS:  Lab Results  Component Value Date   NA 139 10/11/2017   K 4.2 10/11/2017   CL 105 10/11/2017   CO2 25 10/11/2017   GLUCOSE 94 10/11/2017   BUN 14 10/11/2017   CREATININE 1.95 (H) 10/11/2017   CALCIUM 8.7 (L) 10/11/2017   PROT 6.3 (L) 10/11/2017   ALBUMIN 3.9 10/11/2017   AST 25 10/11/2017   ALT 14 10/11/2017   ALKPHOS 61 10/11/2017   BILITOT 0.4 10/11/2017   GFRNONAA 28 (L) 10/11/2017   GFRAA 32 (L) 10/11/2017    Lab Results  Component Value Date   WBC 8.0 10/11/2017   NEUTROABS 6.3 10/11/2017   HGB 12.2 10/11/2017   HCT 36.5 10/11/2017   MCV 97.5 10/11/2017   PLT 273 10/11/2017    STUDIES: Ct Abdomen Pelvis Wo Contrast  Result Date: 10/11/2017 CLINICAL DATA:  Metastatic melanoma. RIGHT lower extremity metastasis. Radiation therapy to the iliac crest EXAM: CT CHEST, ABDOMEN AND PELVIS WITHOUT CONTRAST TECHNIQUE: Multidetector CT imaging of the chest, abdomen and pelvis was performed following the standard protocol without IV contrast. COMPARISON:  CT 08/10/2017 FINDINGS: CT CHEST FINDINGS Cardiovascular: Port in the anterior chest wall with tip in distal SVC. Coronary artery calcification and aortic atherosclerotic calcification. Mediastinum/Nodes: No axillary supraclavicular adenopathy. No mediastinal adenopathy. No pericardial effusion. Esophagus normal. Lungs/Pleura: No suspicious pulmonary nodules.  Airways normal. Musculoskeletal: No aggressive osseous lesion. CT ABDOMEN AND PELVIS FINDINGS Hepatobiliary: No focal hepatic lesion. No biliary ductal dilatation. Gallbladder is normal. Common bile duct is normal. Pancreas: Pancreas is normal. No ductal dilatation. No pancreatic inflammation. Spleen: Normal spleen Adrenals/urinary tract: Adrenal glands and kidneys are normal. The ureters and bladder normal. Stomach/Bowel: Stomach, small bowel, appendix, and  cecum are normal. The colon and rectosigmoid colon are normal. Vascular/Lymphatic: Abdominal aorta is normal caliber. There is no retroperitoneal or periportal lymphadenopathy. Enlarged RIGHT external iliac lymph node measures 11 mm (image 108/2) and unchanged. Reproductive: Normal uterus.  Small ovaries. Other: No peritoneal metastasis. Musculoskeletal: No aggressive osseous lesion. Stable small sclerotic lesions in the pelvis and lower lumbar spine. IMPRESSION: Chest Impression: 1.  No evidence of metastatic melanoma in thorax. 2. Coronary artery calcification and Aortic Atherosclerosis (ICD10-I70.0). Abdomen / Pelvis  Impression: 1. No evidence of metastatic disease in the abdomen pelvis. 2. Stable RIGHT external iliac lymph node. 3. No evidence of skeletal metastasis. Electronically Signed   By: Suzy Bouchard M.D.   On: 10/11/2017 16:07   Ct Chest Wo Contrast  Result Date: 10/11/2017 CLINICAL DATA:  Metastatic melanoma. RIGHT lower extremity metastasis. Radiation therapy to the iliac crest EXAM: CT CHEST, ABDOMEN AND PELVIS WITHOUT CONTRAST TECHNIQUE: Multidetector CT imaging of the chest, abdomen and pelvis was performed following the standard protocol without IV contrast. COMPARISON:  CT 08/10/2017 FINDINGS: CT CHEST FINDINGS Cardiovascular: Port in the anterior chest wall with tip in distal SVC. Coronary artery calcification and aortic atherosclerotic calcification. Mediastinum/Nodes: No axillary supraclavicular adenopathy. No mediastinal adenopathy. No pericardial effusion. Esophagus normal. Lungs/Pleura: No suspicious pulmonary nodules.  Airways normal. Musculoskeletal: No aggressive osseous lesion. CT ABDOMEN AND PELVIS FINDINGS Hepatobiliary: No focal hepatic lesion. No biliary ductal dilatation. Gallbladder is normal. Common bile duct is normal. Pancreas: Pancreas is normal. No ductal dilatation. No pancreatic inflammation. Spleen: Normal spleen Adrenals/urinary tract: Adrenal glands and kidneys  are normal. The ureters and bladder normal. Stomach/Bowel: Stomach, small bowel, appendix, and cecum are normal. The colon and rectosigmoid colon are normal. Vascular/Lymphatic: Abdominal aorta is normal caliber. There is no retroperitoneal or periportal lymphadenopathy. Enlarged RIGHT external iliac lymph node measures 11 mm (image 108/2) and unchanged. Reproductive: Normal uterus.  Small ovaries. Other: No peritoneal metastasis. Musculoskeletal: No aggressive osseous lesion. Stable small sclerotic lesions in the pelvis and lower lumbar spine. IMPRESSION: Chest Impression: 1.  No evidence of metastatic melanoma in thorax. 2. Coronary artery calcification and Aortic Atherosclerosis (ICD10-I70.0). Abdomen / Pelvis Impression: 1. No evidence of metastatic disease in the abdomen pelvis. 2. Stable RIGHT external iliac lymph node. 3. No evidence of skeletal metastasis. Electronically Signed   By: Suzy Bouchard M.D.   On: 10/11/2017 16:07   Nm Bone Scan Whole Body  Result Date: 10/12/2017 CLINICAL DATA:  57 year old female with metastatic melanoma. History of left femur fracture in 2016. Last radiation and chemotherapy in December 2018. Left sacral lesion which was negative on PET this year. EXAM: NUCLEAR MEDICINE WHOLE BODY BONE SCAN TECHNIQUE: Whole body anterior and posterior images were obtained approximately 3 hours after intravenous injection of radiopharmaceutical. RADIOPHARMACEUTICALS:  23.6 mCi Technetium-28mMDP IV COMPARISON:  Whole-body bone scan 08/10/2017.  PET-CT 05/09/2017. FINDINGS: Expected radiotracer activity in both kidneys and the bladder. Unchanged subtle residual activity in the left mid femoral shaft. Stable mild activity in healed posterior left 11th rib fracture. Mild asymmetric radiotracer activity about the left acetabulum and femoral head persists but appears less pronounced since J2024/07/24 particularly when comparing the oblique dedicated pelvis images today and previously. Elsewhere  axial skeleton radiotracer activity is normal, including in the skull. Unchanged degenerative appearing activity at the shoulders, right elbow and wrist, bilateral knees. IMPRESSION: 1. Stable to mildly decreased indeterminate activity about the left acetabulum and femoral head since JJul 24, 2024 No new or other suspicious radiotracer activity. 2. Posttraumatic activity in the left 11th rib, left mid femoral shaft. Scattered degenerative activity in the extremities. Electronically Signed   By: HGenevie AnnM.D.   On: 10/12/2017 08:14    ONCOLOGIC HISTORY: Patient initially diagnosed with metastatic melanoma to right thigh with unknown primary in 2009. She received 4 weeks of high-dose interferon therapy in February 2009 and proceed with low-dose interferon therapy in March 2009. Patient remained in remission until August 2012 when she was noted to have recurrent melanoma and a  pelvic lymph node. October 2012 she had 2 cycles of Ipilumimab which was discontinued secondary to poor tolerance. In April 2017 PET scan showed recurrent disease in the right inguinal region which was confirmed by biopsy. Zeboraf discontinued in November 2017 secondary to intolerable rash. Patient initiated combination immunotherapy with nivolumab and ipilumimab on January 14, 2016. Patient initiated maintenance nivolumab on April 14, 2016 and discontinued on November 07, 2016.  Nivolumab was discontinued secondary to persistent diarrhea.  Single agent oral Mekinist from December 07, 2016 through December 2018.  ASSESSMENT: Recurrent metastatic melanoma, BRAF+, of right lower extremity, metastatic.  PLAN:    1. Recurrent metastatic melanoma, BRAF+, of right lower extremity, metastatic: See oncology treatment history above. She completed palliative XRT to her iliac crest lesion on August 11, 2016.  CT and bone scan from October 11, 2017 reviewed independently and report as above with no obvious recurrent or metastatic disease.  No intervention  or treatment is needed at this time.  She expressed understanding that she still is at risk for recurrence.  She discontinued 2 mg trametinib (Mekinist) daily secondary to rash. If this occurs, will likely reinitiate Mekinist. Can also consider combination therapy using dabrafenib/trametinib in the future. Single agent Cobimetinib is also an option.  Per patient's insurance, no further PET scans will be covered.  Return to clinic in 3 months with repeat imaging and further evaluation.   2. Pain: Resolved. Patient states she has discontinued Plaquenil.  She no longer takes hydrocodone.   3. Nausea: Resolved.  Continue Compazine as needed. 4. Diarrhea: Resolved with Remicade. At least grade 3 and refractory to steroids. Colonoscopy and EGD were unrevealing.  5. Pancreatitis: Resolved.  Unclear etiology. Continue follow-up with GI as indicated. 6. Elevated creatinine: Patient's creatinine is 1.95 today which is approximately her baseline.  Continue treatment and follow-up with nephrology as indicated.   7.  Rash: Resolved.  Likely secondary to Mekinist.  Previously, unresponsive to steroid taper and topical treatments.    I spent a total of 30 minutes face-to-face with the patient of which greater than 50% of the visit was spent in counseling and coordination of care as detailed above.   Patient expressed understanding and was in agreement with this plan. She also understands that She can call clinic at any time with any questions, concerns, or complaints.    Lloyd Huger, MD 10/17/17 9:38 AM

## 2017-10-16 ENCOUNTER — Other Ambulatory Visit: Payer: Self-pay

## 2017-10-16 ENCOUNTER — Encounter: Payer: Self-pay | Admitting: Oncology

## 2017-10-16 ENCOUNTER — Inpatient Hospital Stay: Payer: Medicare HMO | Admitting: Oncology

## 2017-10-16 VITALS — BP 154/87 | HR 57 | Temp 97.7°F | Resp 12 | Ht 67.0 in | Wt 143.2 lb

## 2017-10-16 DIAGNOSIS — R319 Hematuria, unspecified: Secondary | ICD-10-CM

## 2017-10-16 DIAGNOSIS — E039 Hypothyroidism, unspecified: Secondary | ICD-10-CM | POA: Diagnosis not present

## 2017-10-16 DIAGNOSIS — Z9221 Personal history of antineoplastic chemotherapy: Secondary | ICD-10-CM

## 2017-10-16 DIAGNOSIS — I110 Hypertensive heart disease with heart failure: Secondary | ICD-10-CM | POA: Diagnosis not present

## 2017-10-16 DIAGNOSIS — Z85820 Personal history of malignant melanoma of skin: Secondary | ICD-10-CM

## 2017-10-16 DIAGNOSIS — E2740 Unspecified adrenocortical insufficiency: Secondary | ICD-10-CM

## 2017-10-16 DIAGNOSIS — F17210 Nicotine dependence, cigarettes, uncomplicated: Secondary | ICD-10-CM

## 2017-10-16 DIAGNOSIS — C439 Malignant melanoma of skin, unspecified: Secondary | ICD-10-CM

## 2017-10-16 DIAGNOSIS — I509 Heart failure, unspecified: Secondary | ICD-10-CM

## 2017-10-16 DIAGNOSIS — C799 Secondary malignant neoplasm of unspecified site: Secondary | ICD-10-CM

## 2017-10-16 DIAGNOSIS — M810 Age-related osteoporosis without current pathological fracture: Secondary | ICD-10-CM

## 2017-10-16 DIAGNOSIS — Z923 Personal history of irradiation: Secondary | ICD-10-CM

## 2017-10-16 DIAGNOSIS — I429 Cardiomyopathy, unspecified: Secondary | ICD-10-CM | POA: Diagnosis not present

## 2017-10-16 DIAGNOSIS — R944 Abnormal results of kidney function studies: Secondary | ICD-10-CM

## 2017-10-16 DIAGNOSIS — R634 Abnormal weight loss: Secondary | ICD-10-CM

## 2017-10-16 DIAGNOSIS — K219 Gastro-esophageal reflux disease without esophagitis: Secondary | ICD-10-CM | POA: Diagnosis not present

## 2017-10-16 DIAGNOSIS — I700 Atherosclerosis of aorta: Secondary | ICD-10-CM

## 2017-10-16 DIAGNOSIS — Z79899 Other long term (current) drug therapy: Secondary | ICD-10-CM

## 2017-10-16 DIAGNOSIS — E230 Hypopituitarism: Secondary | ICD-10-CM

## 2017-10-16 DIAGNOSIS — N3281 Overactive bladder: Secondary | ICD-10-CM

## 2017-10-16 DIAGNOSIS — Z800 Family history of malignant neoplasm of digestive organs: Secondary | ICD-10-CM

## 2017-10-16 NOTE — Progress Notes (Signed)
Patient here for results. 

## 2017-10-17 DIAGNOSIS — M170 Bilateral primary osteoarthritis of knee: Secondary | ICD-10-CM | POA: Diagnosis not present

## 2017-10-17 DIAGNOSIS — M2242 Chondromalacia patellae, left knee: Secondary | ICD-10-CM | POA: Diagnosis not present

## 2017-10-17 DIAGNOSIS — M2241 Chondromalacia patellae, right knee: Secondary | ICD-10-CM | POA: Diagnosis not present

## 2017-10-24 DIAGNOSIS — M170 Bilateral primary osteoarthritis of knee: Secondary | ICD-10-CM | POA: Diagnosis not present

## 2017-10-24 DIAGNOSIS — M2242 Chondromalacia patellae, left knee: Secondary | ICD-10-CM | POA: Diagnosis not present

## 2017-10-24 DIAGNOSIS — M2241 Chondromalacia patellae, right knee: Secondary | ICD-10-CM | POA: Diagnosis not present

## 2017-11-03 DIAGNOSIS — R600 Localized edema: Secondary | ICD-10-CM | POA: Diagnosis not present

## 2017-11-03 DIAGNOSIS — N2581 Secondary hyperparathyroidism of renal origin: Secondary | ICD-10-CM | POA: Diagnosis not present

## 2017-11-03 DIAGNOSIS — N179 Acute kidney failure, unspecified: Secondary | ICD-10-CM | POA: Diagnosis not present

## 2017-11-03 DIAGNOSIS — N183 Chronic kidney disease, stage 3 (moderate): Secondary | ICD-10-CM | POA: Diagnosis not present

## 2017-11-08 ENCOUNTER — Encounter: Payer: Self-pay | Admitting: Oncology

## 2017-11-15 ENCOUNTER — Inpatient Hospital Stay: Payer: Medicare HMO | Attending: Oncology

## 2017-11-15 DIAGNOSIS — Z85820 Personal history of malignant melanoma of skin: Secondary | ICD-10-CM | POA: Insufficient documentation

## 2017-11-15 DIAGNOSIS — Z452 Encounter for adjustment and management of vascular access device: Secondary | ICD-10-CM | POA: Diagnosis not present

## 2017-11-15 DIAGNOSIS — Z95828 Presence of other vascular implants and grafts: Secondary | ICD-10-CM

## 2017-11-15 MED ORDER — HEPARIN SOD (PORK) LOCK FLUSH 100 UNIT/ML IV SOLN
500.0000 [IU] | Freq: Once | INTRAVENOUS | Status: AC
  Administered 2017-11-15: 500 [IU] via INTRAVENOUS
  Filled 2017-11-15: qty 5

## 2017-11-15 MED ORDER — SODIUM CHLORIDE 0.9% FLUSH
10.0000 mL | Freq: Once | INTRAVENOUS | Status: AC
  Administered 2017-11-15: 10 mL via INTRAVENOUS
  Filled 2017-11-15: qty 10

## 2017-11-22 DIAGNOSIS — K529 Noninfective gastroenteritis and colitis, unspecified: Secondary | ICD-10-CM | POA: Diagnosis not present

## 2017-11-22 DIAGNOSIS — K8681 Exocrine pancreatic insufficiency: Secondary | ICD-10-CM | POA: Diagnosis not present

## 2017-11-27 DIAGNOSIS — Z7982 Long term (current) use of aspirin: Secondary | ICD-10-CM | POA: Diagnosis not present

## 2017-11-27 DIAGNOSIS — I10 Essential (primary) hypertension: Secondary | ICD-10-CM | POA: Diagnosis not present

## 2017-11-27 DIAGNOSIS — E039 Hypothyroidism, unspecified: Secondary | ICD-10-CM | POA: Diagnosis not present

## 2017-11-27 DIAGNOSIS — K219 Gastro-esophageal reflux disease without esophagitis: Secondary | ICD-10-CM | POA: Diagnosis not present

## 2017-11-27 DIAGNOSIS — N189 Chronic kidney disease, unspecified: Secondary | ICD-10-CM | POA: Diagnosis not present

## 2017-11-27 DIAGNOSIS — I129 Hypertensive chronic kidney disease with stage 1 through stage 4 chronic kidney disease, or unspecified chronic kidney disease: Secondary | ICD-10-CM | POA: Diagnosis not present

## 2017-11-27 DIAGNOSIS — Z8719 Personal history of other diseases of the digestive system: Secondary | ICD-10-CM | POA: Diagnosis not present

## 2017-11-27 DIAGNOSIS — K909 Intestinal malabsorption, unspecified: Secondary | ICD-10-CM | POA: Diagnosis not present

## 2017-11-27 DIAGNOSIS — Z79899 Other long term (current) drug therapy: Secondary | ICD-10-CM | POA: Diagnosis not present

## 2017-11-27 DIAGNOSIS — R69 Illness, unspecified: Secondary | ICD-10-CM | POA: Diagnosis not present

## 2017-11-27 DIAGNOSIS — K903 Pancreatic steatorrhea: Secondary | ICD-10-CM | POA: Diagnosis not present

## 2017-11-27 DIAGNOSIS — K8689 Other specified diseases of pancreas: Secondary | ICD-10-CM | POA: Diagnosis not present

## 2017-12-03 ENCOUNTER — Other Ambulatory Visit: Payer: Self-pay | Admitting: Oncology

## 2017-12-06 DIAGNOSIS — K635 Polyp of colon: Secondary | ICD-10-CM | POA: Diagnosis not present

## 2017-12-06 DIAGNOSIS — I509 Heart failure, unspecified: Secondary | ICD-10-CM | POA: Diagnosis not present

## 2017-12-06 DIAGNOSIS — D123 Benign neoplasm of transverse colon: Secondary | ICD-10-CM | POA: Diagnosis not present

## 2017-12-06 DIAGNOSIS — I130 Hypertensive heart and chronic kidney disease with heart failure and stage 1 through stage 4 chronic kidney disease, or unspecified chronic kidney disease: Secondary | ICD-10-CM | POA: Diagnosis not present

## 2017-12-06 DIAGNOSIS — Z79899 Other long term (current) drug therapy: Secondary | ICD-10-CM | POA: Diagnosis not present

## 2017-12-06 DIAGNOSIS — E039 Hypothyroidism, unspecified: Secondary | ICD-10-CM | POA: Diagnosis not present

## 2017-12-06 DIAGNOSIS — K529 Noninfective gastroenteritis and colitis, unspecified: Secondary | ICD-10-CM | POA: Diagnosis not present

## 2017-12-06 DIAGNOSIS — I10 Essential (primary) hypertension: Secondary | ICD-10-CM | POA: Diagnosis not present

## 2017-12-06 DIAGNOSIS — K219 Gastro-esophageal reflux disease without esophagitis: Secondary | ICD-10-CM | POA: Diagnosis not present

## 2017-12-06 DIAGNOSIS — N183 Chronic kidney disease, stage 3 (moderate): Secondary | ICD-10-CM | POA: Diagnosis not present

## 2017-12-06 DIAGNOSIS — R69 Illness, unspecified: Secondary | ICD-10-CM | POA: Diagnosis not present

## 2017-12-06 DIAGNOSIS — E230 Hypopituitarism: Secondary | ICD-10-CM | POA: Diagnosis not present

## 2017-12-06 DIAGNOSIS — E2740 Unspecified adrenocortical insufficiency: Secondary | ICD-10-CM | POA: Diagnosis not present

## 2017-12-06 DIAGNOSIS — Z7982 Long term (current) use of aspirin: Secondary | ICD-10-CM | POA: Diagnosis not present

## 2017-12-06 DIAGNOSIS — K640 First degree hemorrhoids: Secondary | ICD-10-CM | POA: Diagnosis not present

## 2017-12-19 DIAGNOSIS — L300 Nummular dermatitis: Secondary | ICD-10-CM | POA: Diagnosis not present

## 2017-12-19 DIAGNOSIS — L570 Actinic keratosis: Secondary | ICD-10-CM | POA: Diagnosis not present

## 2017-12-19 DIAGNOSIS — Z85820 Personal history of malignant melanoma of skin: Secondary | ICD-10-CM | POA: Diagnosis not present

## 2017-12-19 DIAGNOSIS — D0359 Melanoma in situ of other part of trunk: Secondary | ICD-10-CM | POA: Diagnosis not present

## 2017-12-27 DIAGNOSIS — C779 Secondary and unspecified malignant neoplasm of lymph node, unspecified: Secondary | ICD-10-CM | POA: Diagnosis not present

## 2017-12-27 DIAGNOSIS — Z7952 Long term (current) use of systemic steroids: Secondary | ICD-10-CM | POA: Diagnosis not present

## 2017-12-27 DIAGNOSIS — R69 Illness, unspecified: Secondary | ICD-10-CM | POA: Diagnosis not present

## 2017-12-27 DIAGNOSIS — E2749 Other adrenocortical insufficiency: Secondary | ICD-10-CM | POA: Diagnosis not present

## 2017-12-27 DIAGNOSIS — E038 Other specified hypothyroidism: Secondary | ICD-10-CM | POA: Diagnosis not present

## 2017-12-27 DIAGNOSIS — E230 Hypopituitarism: Secondary | ICD-10-CM | POA: Diagnosis not present

## 2017-12-27 DIAGNOSIS — C439 Malignant melanoma of skin, unspecified: Secondary | ICD-10-CM | POA: Diagnosis not present

## 2018-01-02 ENCOUNTER — Other Ambulatory Visit: Payer: Self-pay | Admitting: *Deleted

## 2018-01-02 ENCOUNTER — Telehealth: Payer: Self-pay | Admitting: *Deleted

## 2018-01-02 DIAGNOSIS — C439 Malignant melanoma of skin, unspecified: Secondary | ICD-10-CM

## 2018-01-02 NOTE — Telephone Encounter (Signed)
Pt left vm regarding PET scan, CT scans. Pt states she has been told her insurance will not cover PET scans but she has spoken with her insurance company and they will approve PET. Pt reports she is not comfortable proceeding with CT scans and bone scans, would like PET scan in its place. Pt is scheduled for next imaging on 11/15. Can we look into if her insurance will cover PET and we can order that in place of CT scans and bone scan.

## 2018-01-03 DIAGNOSIS — Z720 Tobacco use: Secondary | ICD-10-CM | POA: Diagnosis not present

## 2018-01-03 DIAGNOSIS — N183 Chronic kidney disease, stage 3 (moderate): Secondary | ICD-10-CM | POA: Diagnosis not present

## 2018-01-03 DIAGNOSIS — I5022 Chronic systolic (congestive) heart failure: Secondary | ICD-10-CM | POA: Diagnosis not present

## 2018-01-03 DIAGNOSIS — I10 Essential (primary) hypertension: Secondary | ICD-10-CM | POA: Diagnosis not present

## 2018-01-03 DIAGNOSIS — G609 Hereditary and idiopathic neuropathy, unspecified: Secondary | ICD-10-CM | POA: Diagnosis not present

## 2018-01-04 ENCOUNTER — Encounter: Payer: Self-pay | Admitting: *Deleted

## 2018-01-05 ENCOUNTER — Other Ambulatory Visit: Payer: Self-pay | Admitting: *Deleted

## 2018-01-05 DIAGNOSIS — C439 Malignant melanoma of skin, unspecified: Secondary | ICD-10-CM

## 2018-01-12 ENCOUNTER — Ambulatory Visit: Payer: Medicare HMO

## 2018-01-12 ENCOUNTER — Encounter: Payer: Self-pay | Admitting: Oncology

## 2018-01-12 ENCOUNTER — Inpatient Hospital Stay: Payer: Medicare HMO | Attending: Oncology

## 2018-01-12 DIAGNOSIS — C439 Malignant melanoma of skin, unspecified: Secondary | ICD-10-CM

## 2018-01-12 DIAGNOSIS — C499 Malignant neoplasm of connective and soft tissue, unspecified: Secondary | ICD-10-CM | POA: Insufficient documentation

## 2018-01-12 LAB — CBC WITH DIFFERENTIAL/PLATELET
Abs Immature Granulocytes: 0.01 10*3/uL (ref 0.00–0.07)
BASOS ABS: 0.1 10*3/uL (ref 0.0–0.1)
BASOS PCT: 1 %
EOS ABS: 0.3 10*3/uL (ref 0.0–0.5)
EOS PCT: 7 %
HEMATOCRIT: 33.7 % — AB (ref 36.0–46.0)
Hemoglobin: 11 g/dL — ABNORMAL LOW (ref 12.0–15.0)
Immature Granulocytes: 0 %
LYMPHS ABS: 1.2 10*3/uL (ref 0.7–4.0)
Lymphocytes Relative: 30 %
MCH: 30.5 pg (ref 26.0–34.0)
MCHC: 32.6 g/dL (ref 30.0–36.0)
MCV: 93.4 fL (ref 80.0–100.0)
Monocytes Absolute: 0.4 10*3/uL (ref 0.1–1.0)
Monocytes Relative: 9 %
NRBC: 0 % (ref 0.0–0.2)
Neutro Abs: 2.2 10*3/uL (ref 1.7–7.7)
Neutrophils Relative %: 53 %
Platelets: 221 10*3/uL (ref 150–400)
RBC: 3.61 MIL/uL — ABNORMAL LOW (ref 3.87–5.11)
RDW: 11.9 % (ref 11.5–15.5)
WBC: 4.1 10*3/uL (ref 4.0–10.5)

## 2018-01-12 LAB — COMPREHENSIVE METABOLIC PANEL
ALBUMIN: 3.3 g/dL — AB (ref 3.5–5.0)
ALK PHOS: 62 U/L (ref 38–126)
ALT: 14 U/L (ref 0–44)
ANION GAP: 6 (ref 5–15)
AST: 28 U/L (ref 15–41)
BUN: 12 mg/dL (ref 6–20)
CO2: 24 mmol/L (ref 22–32)
CREATININE: 1.54 mg/dL — AB (ref 0.44–1.00)
Calcium: 8.3 mg/dL — ABNORMAL LOW (ref 8.9–10.3)
Chloride: 105 mmol/L (ref 98–111)
GFR calc Af Amer: 42 mL/min — ABNORMAL LOW (ref >60)
GFR calc non Af Amer: 37 mL/min — ABNORMAL LOW (ref >60)
GLUCOSE: 139 mg/dL — AB (ref 70–99)
Potassium: 3.8 mmol/L (ref 3.5–5.1)
Sodium: 135 mmol/L (ref 135–145)
Total Bilirubin: 0.3 mg/dL (ref 0.3–1.2)
Total Protein: 5.5 g/dL — ABNORMAL LOW (ref 6.5–8.1)

## 2018-01-12 MED ORDER — HEPARIN SOD (PORK) LOCK FLUSH 100 UNIT/ML IV SOLN
500.0000 [IU] | Freq: Once | INTRAVENOUS | Status: AC
  Administered 2018-01-12: 500 [IU] via INTRAVENOUS

## 2018-01-12 MED ORDER — HEPARIN SOD (PORK) LOCK FLUSH 100 UNIT/ML IV SOLN
INTRAVENOUS | Status: AC
  Filled 2018-01-12: qty 5

## 2018-01-14 ENCOUNTER — Encounter: Payer: Self-pay | Admitting: Oncology

## 2018-01-15 ENCOUNTER — Ambulatory Visit: Payer: Medicare HMO | Admitting: Oncology

## 2018-01-18 DIAGNOSIS — M546 Pain in thoracic spine: Secondary | ICD-10-CM | POA: Diagnosis not present

## 2018-01-18 DIAGNOSIS — N183 Chronic kidney disease, stage 3 (moderate): Secondary | ICD-10-CM | POA: Diagnosis not present

## 2018-01-18 DIAGNOSIS — G629 Polyneuropathy, unspecified: Secondary | ICD-10-CM | POA: Diagnosis not present

## 2018-01-24 DIAGNOSIS — R600 Localized edema: Secondary | ICD-10-CM | POA: Diagnosis not present

## 2018-01-24 DIAGNOSIS — N183 Chronic kidney disease, stage 3 (moderate): Secondary | ICD-10-CM | POA: Diagnosis not present

## 2018-01-24 DIAGNOSIS — I351 Nonrheumatic aortic (valve) insufficiency: Secondary | ICD-10-CM | POA: Diagnosis not present

## 2018-01-24 DIAGNOSIS — I2510 Atherosclerotic heart disease of native coronary artery without angina pectoris: Secondary | ICD-10-CM | POA: Diagnosis not present

## 2018-01-24 DIAGNOSIS — N2581 Secondary hyperparathyroidism of renal origin: Secondary | ICD-10-CM | POA: Diagnosis not present

## 2018-02-01 ENCOUNTER — Encounter
Admission: RE | Admit: 2018-02-01 | Discharge: 2018-02-01 | Disposition: A | Discharge status: Home / Self Care | Payer: Medicare HMO | Source: Ambulatory Visit | Attending: Oncology | Admitting: Oncology

## 2018-02-01 DIAGNOSIS — C439 Malignant melanoma of skin, unspecified: Secondary | ICD-10-CM

## 2018-02-01 DIAGNOSIS — M899 Disorder of bone, unspecified: Secondary | ICD-10-CM | POA: Insufficient documentation

## 2018-02-01 LAB — GLUCOSE, CAPILLARY: GLUCOSE-CAPILLARY: 77 mg/dL (ref 70–99)

## 2018-02-01 MED ORDER — FLUDEOXYGLUCOSE F - 18 (FDG) INJECTION
7.1600 | Freq: Once | INTRAVENOUS | Status: AC | PRN
  Administered 2018-02-01: 7.16 via INTRAVENOUS

## 2018-02-04 ENCOUNTER — Other Ambulatory Visit: Payer: Self-pay | Admitting: Oncology

## 2018-02-05 ENCOUNTER — Encounter: Payer: Self-pay | Admitting: Oncology

## 2018-02-05 DIAGNOSIS — R200 Anesthesia of skin: Secondary | ICD-10-CM | POA: Diagnosis not present

## 2018-02-05 DIAGNOSIS — R202 Paresthesia of skin: Secondary | ICD-10-CM | POA: Diagnosis not present

## 2018-02-06 ENCOUNTER — Telehealth: Payer: Self-pay | Admitting: *Deleted

## 2018-02-06 NOTE — Telephone Encounter (Signed)
Pt notified of PET results, per Dr. Grayland Ormond PET scan negative. Pt does need follow up evaluation but per Dr. Grayland Ormond can wait until after the holidays if patient desires. Pt requests appt to see Dr. Grayland Ormond with port flush in early January. Schedule message sent.

## 2018-02-12 ENCOUNTER — Other Ambulatory Visit: Payer: Medicare HMO

## 2018-02-12 ENCOUNTER — Ambulatory Visit: Payer: Medicare HMO

## 2018-02-13 DIAGNOSIS — N941 Unspecified dyspareunia: Secondary | ICD-10-CM | POA: Diagnosis not present

## 2018-02-13 DIAGNOSIS — N814 Uterovaginal prolapse, unspecified: Secondary | ICD-10-CM | POA: Diagnosis not present

## 2018-02-13 DIAGNOSIS — N810 Urethrocele: Secondary | ICD-10-CM | POA: Diagnosis not present

## 2018-02-14 DIAGNOSIS — R1084 Generalized abdominal pain: Secondary | ICD-10-CM | POA: Diagnosis not present

## 2018-02-14 DIAGNOSIS — K591 Functional diarrhea: Secondary | ICD-10-CM | POA: Diagnosis not present

## 2018-03-02 NOTE — Progress Notes (Signed)
[place]  Telephone:([phone] [phone]  ID: [name] OB: 10-26-1960  MR#: [id number]  YYQ#:[id number]  Patient Care Team: System, Pcp Not In as PCP - General Byrnett, Forest Gleason, MD as Consulting Physician (General Surgery) Forest Gleason, MD (Inactive) (Oncology) Lloyd Huger, MD as Consulting Physician (Oncology)  CHIEF COMPLAINT: Recurrent metastatic melanoma, BRAF+, of right lower extremity, metastatic.  INTERVAL HISTORY: Patient returns to clinic today for further evaluation and discussion of her imaging results.  She continues to feel well and remains asymptomatic. She does not complain of weakness or fatigue.  She has no neurologic complaints. She denies any recent fevers or illnesses. She has a good appetite. She denies any chest pain or shortness of breath. She has no nausea, vomiting, diarrhea, or constipation. She denies any joint pain.  She has no urinary complaints.  Patient feels at her baseline offers no specific complaints today.  REVIEW OF SYSTEMS:   Review of Systems  Constitutional: Negative.  Negative for fever, malaise/fatigue and weight loss.  HENT: Negative.  Negative for ear pain and tinnitus.   Eyes: Negative.   Respiratory: Negative.  Negative for cough and shortness of breath.   Cardiovascular: Negative.  Negative for chest pain and leg swelling.  Gastrointestinal: Negative for abdominal pain, blood in stool, diarrhea, heartburn and melena.  Genitourinary: Negative.  Negative for dysuria.  Musculoskeletal: Negative.  Negative for back pain and joint pain.  Skin: Negative.  Negative for itching and rash.  Neurological: Negative.  Negative for sensory change, focal weakness and weakness.  Endo/Heme/Allergies: Does not bruise/bleed easily.  Psychiatric/Behavioral: Negative.  Negative for depression. The patient is not nervous/anxious and does not have insomnia.     As per HPI. Otherwise, a complete review of  systems is negative.  PAST MEDICAL HISTORY: Past Medical History:  Diagnosis Date  . Adrenal insufficiency ([place])   . Bipolar disorder ([place])   . Cardiomyopathy ([place])   . CHF (congestive heart failure) ([place]) 2016   Dr. Nehemiah Massed  . Depression   . GERD (gastroesophageal reflux disease) 2012  . Hypertension   . Hypothyroidism   . Joint pain   . Lumbar herniated disc 2010  . Malignant melanoma of skin of lower limb, including hip ([place]) 2008, 2017   right thigh/2017- stage 4  . Osteoporosis, postmenopausal   . Overactive bladder   . Pancreatitis   . Panhypopituitarism ([place])   . Personal history of chemotherapy 2008   melonoma  . Personal history of malignant melanoma of skin 2012  . Personal history of radiation therapy 2017   melonoma  . Rectal bleeding   . Supraglottitis   . Tendonitis   . Tick bite of head 07/20/2015  . Weight loss, unintentional     PAST SURGICAL HISTORY: Past Surgical History:  Procedure Laterality Date  . BACK SURGERY  2010  . BREAST CYST EXCISION  1985  . COLONOSCOPY  07/2012   [facility]  . COLONOSCOPY WITH PROPOFOL N/A 11/21/2016   Procedure: COLONOSCOPY WITH PROPOFOL;  Surgeon: Manya Silvas, MD;  [facility] END[facility];  Service: Endoscopy;  Laterality: N/A;  . ESOPHAGOGASTRODUODENOSCOPY    . ESOPHAGOGASTRODUODENOSCOPY (EGD) WITH PROPOFOL N/A 11/21/2016   Procedure: ESOPHAGOGASTRODUODENOSCOPY (EGD) WITH PROPOFOL;  Surgeon: Manya Silvas, MD;  Location: [facility] ENDOSCOPY;  Service: Endoscopy;  Laterality: N/A;  . FRACTURE SURGERY     rod removed from left femur 07/21/2014  . INGUINAL LYMPH NODE BIOPSY Right 07/17/2015   Procedure: INGUINAL LYMPH NODE BIOPSY/EXCISION;  Surgeon: Robert Bellow, MD;  Location: [facility] ORS;  Service: General;  Laterality: Right;  . laminnotomy reexploration posterior lumbar   12/26/2008   with nerve decomp and discectomy  . MELANOMA EXCISION  2008   right thigh  . PORTACATH PLACEMENT Left 03/14/2016   Procedure:  INSERTION PORT-A-CATH;  Surgeon: Robert Bellow, MD;  Location: [facility] ORS;  Service: General;  Laterality: Left;  . SKIN LESION EXCISION Right 12-19-10   posterior  . SPINE SURGERY      FAMILY HISTORY: Family History  Problem Relation Age of Onset  . COPD Father   . Cancer Sister        Colon, sister       ADVANCED DIRECTIVES:    HEALTH MAINTENANCE: Social History   Tobacco Use  . Smoking status: Current Every Day Smoker    Packs/day: 0.50    Years: 20.00    Pack years: 10.00    Types: Cigarettes  . Smokeless tobacco: Never Used  Substance Use Topics  . Alcohol use: Yes    Alcohol/week: 0.0 standard drinks    Comment: ocassionally  . Drug use: No     Colonoscopy:  PAP:  Bone density:  Lipid panel:  Allergies  Allergen Reactions  . Clindamycin Shortness Of Breath  . Levaquin [Levofloxacin] Other (See Comments)    Joint inflammation  . Aripiprazole Other (See Comments)    Facial and head ticks  . Depakote [Divalproex Sodium] Other (See Comments)  . Valproic Acid     Other reaction(s): Other (See Comments) Facial twitching  . Ondansetron Hcl Nausea And Vomiting  . Promethazine Nausea And Vomiting    Current Outpatient Medications  Medication Sig Dispense Refill  . calcitRIOL (ROCALTROL) 0.25 MCG capsule Take 0.25 mcg by mouth daily.  6  . cetirizine (ZYRTEC ALLERGY) 10 MG tablet Take 10 mg by mouth daily.    . clonazePAM (KLONOPIN) 0.5 MG tablet Take 0.5 mg by mouth at bedtime.    Marland Kitchen desvenlafaxine (PRISTIQ) 100 MG 24 hr tablet Take 100 mg by mouth daily.    Marland Kitchen gabapentin (NEURONTIN) 300 MG capsule Take 600 mg by mouth 2 (two) times daily.     . hydrocortisone (CORTEF) 10 MG tablet Take 10-15 mg by mouth 2 (two) times daily. 1.5 tablets (15 MG) in the morning and 1 tablet (10 MG) in the evening    . levothyroxine (SYNTHROID, LEVOTHROID) 137 MCG tablet Take 150 mcg by mouth daily.     . Metoclopramide HCl (REGLAN PO) Take by mouth.    . Multiple Vitamin  (MULTIVITAMIN WITH MINERALS) TABS tablet Take 1 tablet by mouth daily.    . pantoprazole (PROTONIX) 40 MG tablet TAKE 1 TABLET BY MOUTH TWICE A DAY 60 tablet 3  . prochlorperazine (COMPAZINE) 10 MG tablet Take 1 tablet (10 mg total) by mouth every 6 (six) hours as needed for nausea or vomiting. 60 tablet 1   No current facility-administered medications for this visit.     OBJECTIVE: Vitals:   03/06/18 1513  BP: 126/81  Pulse: 66  Temp: 97.6 F (36.4 C)     Body mass index is 20.58 kg/m.    ECOG FS:0 - Asymptomatic  General: Well-developed, well-nourished, no acute distress. Eyes: Pink conjunctiva, anicteric sclera. HEENT: Normocephalic, moist mucous membranes, clear oropharnyx. Lungs: Clear to auscultation bilaterally. Heart: Regular rate and rhythm. No rubs, murmurs, or gallops. Abdomen: Soft, nontender, nondistended. No organomegaly noted, normoactive bowel sounds. Musculoskeletal: No edema, cyanosis, or clubbing. Neuro: Alert,  answering all questions appropriately. Cranial nerves grossly intact. Skin: No rashes or petechiae noted. Psych: Normal affect. Lymphatics: No cervical, calvicular, axillary or inguinal LAD.   LAB RESULTS:  Lab Results  Component Value Date   NA 135 01/12/2018   K 3.8 01/12/2018   CL 105 01/12/2018   CO2 24 01/12/2018   GLUCOSE 139 (H) 01/12/2018   BUN 12 01/12/2018   CREATININE 1.54 (H) 01/12/2018   CALCIUM 8.3 (L) 01/12/2018   PROT 5.5 (L) 01/12/2018   ALBUMIN 3.3 (L) 01/12/2018   AST 28 01/12/2018   ALT 14 01/12/2018   ALKPHOS 62 01/12/2018   BILITOT 0.3 01/12/2018   GFRNONAA 37 (L) 01/12/2018   GFRAA 42 (L) 01/12/2018    Lab Results  Component Value Date   WBC 4.1 01/12/2018   NEUTROABS 2.2 01/12/2018   HGB 11.0 (L) 01/12/2018   HCT 33.7 (L) 01/12/2018   MCV 93.4 01/12/2018   PLT 221 01/12/2018    STUDIES: No results found.  ONCOLOGIC HISTORY: Patient initially diagnosed with metastatic melanoma to right thigh with unknown  primary in 2009. She received 4 weeks of high-dose interferon therapy in February 2009 and proceed with low-dose interferon therapy in March 2009. Patient remained in remission until August 2012 when she was noted to have recurrent melanoma and a pelvic lymph node. October 2012 she had 2 cycles of Ipilumimab which was discontinued secondary to poor tolerance. In April 2017 PET scan showed recurrent disease in the right inguinal region which was confirmed by biopsy. Zeboraf discontinued in November 2017 secondary to intolerable rash. Patient initiated combination immunotherapy with nivolumab and ipilumimab on January 14, 2016. Patient initiated maintenance nivolumab on April 14, 2016 and discontinued on November 07, 2016.  Nivolumab was discontinued secondary to persistent diarrhea.  Single agent oral Mekinist from December 07, 2016 through December 2018.  ASSESSMENT: Recurrent metastatic melanoma, BRAF+, of right lower extremity, metastatic.  PLAN:    1. Recurrent metastatic melanoma, BRAF+, of right lower extremity, metastatic: See oncology treatment history above. She completed palliative XRT to her iliac crest lesion on August 11, 2016.  PET scan results from February 01, 2018 reviewed independently with no obvious evidence of progressive or recurrent disease. No intervention or treatment is needed at this time.  She expressed understanding that she still is at risk for recurrence.  She discontinued 2 mg trametinib (Mekinist) daily secondary to rash. If this occurs, could possibly reinitiate Mekinist. Can also consider combination therapy using dabrafenib/trametinib in the future.  Return to clinic in 6 months with repeat imaging and further evaluation. 2. Pain: Resolved. Patient states she has discontinued Plaquenil.  She no longer takes hydrocodone.   3. Nausea: Resolved.  Continue Compazine as needed. 4. Diarrhea: Resolved with Remicade. At least grade 3 and refractory to steroids. Colonoscopy and  EGD were unrevealing.  5. Pancreatitis: Resolved.  Unclear etiology. Continue follow-up with GI as indicated. 6. Elevated creatinine: Patient's most recent creatinine was reported at 1.54.  Continue follow-up with nephrology as needed.  I spent a total of 20 minutes face-to-face with the patient of which greater than 50% of the visit was spent in counseling and coordination of care as detailed above.   Patient expressed understanding and was in agreement with this plan. She also understands that She can call clinic at any time with any questions, concerns, or complaints.    Lloyd Huger, MD 03/09/18 11:06 AM

## 2018-03-06 ENCOUNTER — Encounter: Payer: Self-pay | Admitting: Oncology

## 2018-03-06 ENCOUNTER — Inpatient Hospital Stay: Payer: Medicare Other | Admitting: Oncology

## 2018-03-06 ENCOUNTER — Inpatient Hospital Stay: Payer: Medicare Other | Attending: Oncology

## 2018-03-06 VITALS — BP 126/81 | HR 66 | Temp 97.6°F | Wt 131.4 lb

## 2018-03-06 DIAGNOSIS — F319 Bipolar disorder, unspecified: Secondary | ICD-10-CM | POA: Diagnosis not present

## 2018-03-06 DIAGNOSIS — Z923 Personal history of irradiation: Secondary | ICD-10-CM

## 2018-03-06 DIAGNOSIS — E039 Hypothyroidism, unspecified: Secondary | ICD-10-CM | POA: Insufficient documentation

## 2018-03-06 DIAGNOSIS — E2740 Unspecified adrenocortical insufficiency: Secondary | ICD-10-CM | POA: Insufficient documentation

## 2018-03-06 DIAGNOSIS — C439 Malignant melanoma of skin, unspecified: Secondary | ICD-10-CM

## 2018-03-06 DIAGNOSIS — I429 Cardiomyopathy, unspecified: Secondary | ICD-10-CM

## 2018-03-06 DIAGNOSIS — C799 Secondary malignant neoplasm of unspecified site: Secondary | ICD-10-CM

## 2018-03-06 DIAGNOSIS — Z452 Encounter for adjustment and management of vascular access device: Secondary | ICD-10-CM | POA: Insufficient documentation

## 2018-03-06 DIAGNOSIS — I509 Heart failure, unspecified: Secondary | ICD-10-CM | POA: Diagnosis not present

## 2018-03-06 DIAGNOSIS — M810 Age-related osteoporosis without current pathological fracture: Secondary | ICD-10-CM | POA: Insufficient documentation

## 2018-03-06 DIAGNOSIS — F17210 Nicotine dependence, cigarettes, uncomplicated: Secondary | ICD-10-CM | POA: Diagnosis not present

## 2018-03-06 DIAGNOSIS — I110 Hypertensive heart disease with heart failure: Secondary | ICD-10-CM | POA: Insufficient documentation

## 2018-03-06 DIAGNOSIS — K219 Gastro-esophageal reflux disease without esophagitis: Secondary | ICD-10-CM

## 2018-03-06 DIAGNOSIS — Z85820 Personal history of malignant melanoma of skin: Secondary | ICD-10-CM | POA: Diagnosis not present

## 2018-03-06 DIAGNOSIS — Z79899 Other long term (current) drug therapy: Secondary | ICD-10-CM | POA: Diagnosis not present

## 2018-03-06 DIAGNOSIS — Z95828 Presence of other vascular implants and grafts: Secondary | ICD-10-CM

## 2018-03-06 DIAGNOSIS — E230 Hypopituitarism: Secondary | ICD-10-CM

## 2018-03-06 DIAGNOSIS — Z9221 Personal history of antineoplastic chemotherapy: Secondary | ICD-10-CM

## 2018-03-06 MED ORDER — HEPARIN SOD (PORK) LOCK FLUSH 100 UNIT/ML IV SOLN
INTRAVENOUS | Status: AC
  Filled 2018-03-06: qty 5

## 2018-03-06 MED ORDER — SODIUM CHLORIDE 0.9% FLUSH
10.0000 mL | Freq: Once | INTRAVENOUS | Status: AC
  Administered 2018-03-06: 10 mL via INTRAVENOUS
  Filled 2018-03-06: qty 10

## 2018-03-06 MED ORDER — HEPARIN SOD (PORK) LOCK FLUSH 100 UNIT/ML IV SOLN
500.0000 [IU] | Freq: Once | INTRAVENOUS | Status: AC
  Administered 2018-03-06: 500 [IU] via INTRAVENOUS

## 2018-03-06 NOTE — Progress Notes (Signed)
Patient denies any concerns today.  

## 2018-03-19 ENCOUNTER — Other Ambulatory Visit: Payer: Self-pay

## 2018-03-19 ENCOUNTER — Encounter: Payer: Self-pay | Admitting: Surgery

## 2018-03-19 ENCOUNTER — Ambulatory Visit: Payer: Medicare Other | Admitting: Surgery

## 2018-03-19 VITALS — BP 155/80 | HR 71 | Temp 97.2°F | Wt 134.2 lb

## 2018-03-19 DIAGNOSIS — C439 Malignant melanoma of skin, unspecified: Secondary | ICD-10-CM | POA: Diagnosis not present

## 2018-03-19 NOTE — Progress Notes (Signed)
PROCEDURE NOTE  DX: MELANOMA  PROCEDURE: port removal  ANESTHESIA: lidocaine 1% w epi 8cc  COMPLICATIONS: NONE   Informed consent was obtained patient was prepped and draped in the usual sterile fashion and placed in a supine position.  Lidocaine was infiltrated and 15 blade knife used to create incision.  Metzenbaum scissors were used to dissect the port from adjacent structures and we remove the 2 Prolene stitches.  We closed the wound in a 2 layer fashion with interrupted 3-0 Vicryls and 4-0 Monocryl for the skin.  Dermabond was used.  No complications

## 2018-03-19 NOTE — Patient Instructions (Signed)
Patient is to return to the office as needed. Do not shower for 48 hours keep the area dry and clean.   Call the office with any questions or concerns.

## 2018-05-02 ENCOUNTER — Other Ambulatory Visit: Payer: Self-pay | Admitting: Oncology

## 2018-05-02 DIAGNOSIS — Z1231 Encounter for screening mammogram for malignant neoplasm of breast: Secondary | ICD-10-CM

## 2018-05-03 ENCOUNTER — Other Ambulatory Visit: Payer: Self-pay | Admitting: Oncology

## 2018-05-03 DIAGNOSIS — Z7952 Long term (current) use of systemic steroids: Secondary | ICD-10-CM | POA: Insufficient documentation

## 2018-05-03 DIAGNOSIS — F17200 Nicotine dependence, unspecified, uncomplicated: Secondary | ICD-10-CM | POA: Insufficient documentation

## 2018-05-11 ENCOUNTER — Telehealth: Payer: Self-pay | Admitting: *Deleted

## 2018-05-11 NOTE — Telephone Encounter (Signed)
Per Dr. Grayland Ormond ok for patient to have vaginal estrogen cream.

## 2018-05-11 NOTE — Telephone Encounter (Signed)
GYN do asking if vaginal estrogen cream can be used in this patient who has a vaginal prolapse. Please return call [phone]

## 2018-05-11 NOTE — Telephone Encounter (Signed)
Office closed for lunch

## 2018-05-11 NOTE — Telephone Encounter (Signed)
Called and spoke with April and informed per VO Dr Grayland Ormond ok to use vaginal estrogen cream

## 2018-05-15 ENCOUNTER — Ambulatory Visit
Admission: RE | Admit: 2018-05-15 | Discharge: 2018-05-15 | Disposition: A | Discharge status: Home / Self Care | Payer: Medicare Other | Source: Ambulatory Visit | Attending: Oncology | Admitting: Oncology

## 2018-05-15 ENCOUNTER — Other Ambulatory Visit: Payer: Self-pay

## 2018-05-15 DIAGNOSIS — Z1231 Encounter for screening mammogram for malignant neoplasm of breast: Secondary | ICD-10-CM | POA: Diagnosis not present

## 2018-09-04 ENCOUNTER — Other Ambulatory Visit: Payer: Self-pay | Admitting: *Deleted

## 2018-09-04 DIAGNOSIS — C799 Secondary malignant neoplasm of unspecified site: Secondary | ICD-10-CM

## 2018-09-04 DIAGNOSIS — C439 Malignant melanoma of skin, unspecified: Secondary | ICD-10-CM

## 2018-09-04 NOTE — Progress Notes (Signed)
Entered in error

## 2018-09-10 ENCOUNTER — Other Ambulatory Visit: Payer: Medicare Other

## 2018-09-11 ENCOUNTER — Ambulatory Visit
Admission: RE | Admit: 2018-09-11 | Discharge: 2018-09-11 | Disposition: A | Discharge status: Home / Self Care | Payer: Medicare Other | Source: Ambulatory Visit | Attending: Oncology | Admitting: Oncology

## 2018-09-11 ENCOUNTER — Other Ambulatory Visit: Payer: Self-pay

## 2018-09-11 DIAGNOSIS — I2510 Atherosclerotic heart disease of native coronary artery without angina pectoris: Secondary | ICD-10-CM | POA: Insufficient documentation

## 2018-09-11 DIAGNOSIS — I700 Atherosclerosis of aorta: Secondary | ICD-10-CM | POA: Diagnosis not present

## 2018-09-11 DIAGNOSIS — R918 Other nonspecific abnormal finding of lung field: Secondary | ICD-10-CM | POA: Diagnosis not present

## 2018-09-11 DIAGNOSIS — C799 Secondary malignant neoplasm of unspecified site: Secondary | ICD-10-CM

## 2018-09-11 DIAGNOSIS — C439 Malignant melanoma of skin, unspecified: Secondary | ICD-10-CM | POA: Diagnosis not present

## 2018-09-11 HISTORY — DX: Disorder of kidney and ureter, unspecified: N28.9

## 2018-09-11 LAB — POCT I-STAT CREATININE: Creatinine, Ser: 1.8 mg/dL — ABNORMAL HIGH (ref 0.44–1.00)

## 2018-09-11 MED ORDER — IOHEXOL 300 MG/ML  SOLN
100.0000 mL | Freq: Once | INTRAMUSCULAR | Status: AC | PRN
  Administered 2018-09-11: 60 mL via INTRAVENOUS

## 2018-09-13 ENCOUNTER — Other Ambulatory Visit: Payer: Self-pay

## 2018-09-13 ENCOUNTER — Encounter: Payer: Self-pay | Admitting: Oncology

## 2018-09-13 ENCOUNTER — Inpatient Hospital Stay: Payer: Medicare Other | Attending: Oncology | Admitting: Oncology

## 2018-09-13 DIAGNOSIS — C799 Secondary malignant neoplasm of unspecified site: Secondary | ICD-10-CM

## 2018-09-13 DIAGNOSIS — C439 Malignant melanoma of skin, unspecified: Secondary | ICD-10-CM | POA: Diagnosis not present

## 2018-09-13 DIAGNOSIS — C792 Secondary malignant neoplasm of skin: Secondary | ICD-10-CM | POA: Diagnosis not present

## 2018-09-13 NOTE — Progress Notes (Signed)
Patient would like her CT Scan results. Patient stated that she had been doing well with no complaints.

## 2018-09-14 NOTE — Progress Notes (Signed)
[place]  [phone] [phone]) [phone]  ID: Patricia Li OB: 02/03/1961  MR#: [id number]  XHF#:[id number]  Patient Care Team: System, Pcp Not In as PCP - General Byrnett, Forest Gleason, MD as Consulting Physician (General Surgery) Forest Gleason, MD (Inactive) (Oncology) Lloyd Huger, MD as Consulting Physician (Oncology)  I connected with Patricia Li on 09/14/18 at  2:15 PM EDT by video enabled telemedicine visit and verified that I am speaking with the correct person using two identifiers.   I discussed the limitations, risks, security and privacy concerns of performing an evaluation and management service by telemedicine and the availability of in-person appointments. I also discussed with the patient that there may be a patient responsible charge related to this service. The patient expressed understanding and agreed to proceed.   Other persons participating in the visit and their role in the encounter: Patient, MD  Patients location: Daughter's car Providers location: Clinic  CHIEF COMPLAINT: Recurrent metastatic melanoma, BRAF+, of right lower extremity, metastatic.  INTERVAL HISTORY: Patient initially agreed to video enabled telemedicine visit, but due to technical difficulties visit was transitioned to telephone only midway through.  She continues to feel well and remains asymptomatic.  She denies any pain.  She continues to have persistent swelling/edema in her right groin that is unchanged.  She does not complain of weakness or fatigue.  She has no neurologic complaints. She denies any recent fevers or illnesses. She has a good appetite.  She denies any chest pain, shortness of breath, cough, or hemoptysis.  She has no nausea, vomiting, diarrhea, or constipation. She denies any joint pain.  She has no urinary complaints.  Patient offers no specific complaints today.  REVIEW OF SYSTEMS:   Review of Systems  Constitutional: Negative.   Negative for fever, malaise/fatigue and weight loss.  HENT: Negative.  Negative for ear pain and tinnitus.   Eyes: Negative.   Respiratory: Negative.  Negative for cough and shortness of breath.   Cardiovascular: Negative.  Negative for chest pain and leg swelling.  Gastrointestinal: Negative for abdominal pain, blood in stool, diarrhea, heartburn and melena.  Genitourinary: Negative.  Negative for dysuria.  Musculoskeletal: Negative.  Negative for back pain and joint pain.  Skin: Negative.  Negative for itching and rash.  Neurological: Negative.  Negative for sensory change, focal weakness and weakness.  Endo/Heme/Allergies: Does not bruise/bleed easily.  Psychiatric/Behavioral: Negative.  Negative for depression. The patient is not nervous/anxious and does not have insomnia.     As per HPI. Otherwise, a complete review of systems is negative.  PAST MEDICAL HISTORY: Past Medical History:  Diagnosis Date   Adrenal insufficiency ([place])    Bipolar disorder ([place])    Cardiomyopathy ([place])    CHF (congestive heart failure) ([place]) 2016   Dr. Nehemiah Massed   Depression    GERD (gastroesophageal reflux disease) 2012   Hypertension    Hypothyroidism    Joint pain    Lumbar herniated disc 2010   Malignant melanoma of skin of lower limb, including hip ([place]) 2008, 2017   right thigh/2017- stage 4   Osteoporosis, postmenopausal    Overactive bladder    Pancreatitis    Panhypopituitarism ([place])    Personal history of chemotherapy 2008   melonoma   Personal history of malignant melanoma of skin 2012   Personal history of radiation therapy 2017   melonoma   Rectal bleeding    Renal insufficiency    Supraglottitis    Tendonitis  Tick bite of head 07/20/2015   Weight loss, unintentional     PAST SURGICAL HISTORY: Past Surgical History:  Procedure Laterality Date   BACK SURGERY  2010   BREAST CYST EXCISION  1985   COLONOSCOPY  07/2012   [facility]   COLONOSCOPY  WITH PROPOFOL N/A 11/21/2016   Procedure: COLONOSCOPY WITH PROPOFOL;  Surgeon: Manya Silvas, MD;  Location: [facility] ENDOSCOPY;  Service: Endoscopy;  Laterality: N/A;   ESOPHAGOGASTRODUODENOSCOPY     ESOPHAGOGASTRODUODENOSCOPY (EGD) WITH PROPOFOL N/A 11/21/2016   Procedure: ESOPHAGOGASTRODUODENOSCOPY (EGD) WITH PROPOFOL;  Surgeon: Manya Silvas, MD;  Location: [facility] ENDOSCOPY;  Service: Endoscopy;  Laterality: N/A;   FRACTURE SURGERY     rod removed from left femur 07/21/2014   INGUINAL LYMPH NODE BIOPSY Right 07/17/2015   Procedure: INGUINAL LYMPH NODE BIOPSY/EXCISION;  Surgeon: Robert Bellow, MD;  Location: [facility] ORS;  Service: General;  Laterality: Right;   laminnotomy reexploration posterior lumbar   12/26/2008   with nerve decomp and discectomy   MELANOMA EXCISION  2008   right thigh   PORTACATH PLACEMENT Left 03/14/2016   Procedure: INSERTION PORT-A-CATH;  Surgeon: Robert Bellow, MD;  Location: [facility] ORS;  Service: General;  Laterality: Left;   SKIN LESION EXCISION Right 12-19-10   posterior   SPINE SURGERY      FAMILY HISTORY: Family History  Problem Relation Age of Onset   COPD Father    Cancer Sister        Colon, sister   Breast cancer Neg Hx        ADVANCED DIRECTIVES:    HEALTH MAINTENANCE: Social History   Tobacco Use   Smoking status: Current Every Day Smoker    Packs/day: 0.50    Years: 20.00    Pack years: 10.00    Types: Cigarettes   Smokeless tobacco: Never Used  Substance Use Topics   Alcohol use: Yes    Alcohol/week: 0.0 standard drinks    Comment: ocassionally   Drug use: No     Colonoscopy:  PAP:  Bone density:  Lipid panel:  Allergies  Allergen Reactions   Clindamycin Shortness Of Breath   Levaquin [Levofloxacin] Other (See Comments)    Joint inflammation   Aripiprazole Other (See Comments)    Facial and head ticks   Depakote [Divalproex Sodium] Other (See Comments)   Valproic Acid     Other reaction(s):  Other (See Comments) Facial twitching   Ondansetron Hcl Nausea And Vomiting   Promethazine Nausea And Vomiting    Current Outpatient Medications  Medication Sig Dispense Refill   calcitRIOL (ROCALTROL) 0.25 MCG capsule Take 0.25 mcg by mouth daily.  6   clonazePAM (KLONOPIN) 0.5 MG tablet Take 0.5 mg by mouth at bedtime.     hydrocortisone (CORTEF) 10 MG tablet Take 10-15 mg by mouth 2 (two) times daily. 1.5 tablets (15 MG) in the morning and 1 tablet (10 MG) in the evening     levothyroxine (SYNTHROID, LEVOTHROID) 137 MCG tablet Take 150 mcg by mouth daily.      Metoclopramide HCl (REGLAN PO) Take by mouth.     Multiple Vitamin (MULTIVITAMIN WITH MINERALS) TABS tablet Take 1 tablet by mouth daily.     pantoprazole (PROTONIX) 40 MG tablet TAKE 1 TABLET BY MOUTH TWICE A DAY 180 tablet 1   pregabalin (LYRICA) 50 MG capsule Take 25 mg by mouth 3 (three) times daily.     prochlorperazine (COMPAZINE) 10 MG tablet Take 1 tablet (10 mg total) by mouth  every 6 (six) hours as needed for nausea or vomiting. 60 tablet 1   cetirizine (ZYRTEC ALLERGY) 10 MG tablet Take 10 mg by mouth daily.     clobetasol (TEMOVATE) 0.05 % external solution Apply 0.5 mLs topically 1 day or 1 dose.     desvenlafaxine (PRISTIQ) 100 MG 24 hr tablet Take 100 mg by mouth daily.     gabapentin (NEURONTIN) 300 MG capsule Take 600 mg by mouth 2 (two) times daily.      Multiple Vitamin (MULTI-VITAMIN) tablet Take 1 tablet by mouth 1 day or 1 dose.     propranolol (INDERAL) 20 MG tablet Take 1 tablet by mouth 1 day or 1 dose.     QUEtiapine (SEROQUEL) 25 MG tablet Take 1 tablet by mouth at bedtime.     No current facility-administered medications for this visit.     OBJECTIVE: There were no vitals filed for this visit.   There is no height or weight on file to calculate BMI.    ECOG FS:0 - Asymptomatic  General: Well-developed, well-nourished, no acute distress. HEENT: Normocephalic, moist mucous  membranes. Neuro: Alert, answering all questions appropriately. Cranial nerves grossly intact. Skin: No rashes or petechiae noted. Psych: Normal affect.  LAB RESULTS:  Lab Results  Component Value Date   NA 135 01/12/2018   K 3.8 01/12/2018   CL 105 01/12/2018   CO2 24 01/12/2018   GLUCOSE 139 (H) 01/12/2018   BUN 12 01/12/2018   CREATININE 1.80 (H) 09/11/2018   CALCIUM 8.3 (L) 01/12/2018   PROT 5.5 (L) 01/12/2018   ALBUMIN 3.3 (L) 01/12/2018   AST 28 01/12/2018   ALT 14 01/12/2018   ALKPHOS 62 01/12/2018   BILITOT 0.3 01/12/2018   GFRNONAA 37 (L) 01/12/2018   GFRAA 42 (L) 01/12/2018    Lab Results  Component Value Date   WBC 4.1 01/12/2018   NEUTROABS 2.2 01/12/2018   HGB 11.0 (L) 01/12/2018   HCT 33.7 (L) 01/12/2018   MCV 93.4 01/12/2018   PLT 221 01/12/2018    STUDIES: Ct Chest W Contrast  Result Date: 09/11/2018 CLINICAL DATA:  Follow-up recurrent melanoma of the right lower extremity EXAM: CT CHEST, ABDOMEN, AND PELVIS WITH CONTRAST TECHNIQUE: Multidetector CT imaging of the chest, abdomen and pelvis was performed following the standard protocol during bolus administration of intravenous contrast. CONTRAST:  10m OMNIPAQUE IOHEXOL 300 MG/ML  SOLN COMPARISON:  PET-CT dated 02/01/2018 FINDINGS: CT CHEST FINDINGS Cardiovascular: The heart is normal in size. No pericardial effusion. No evidence of thoracic aortic aneurysm. Mild atherosclerotic calcifications of the aortic arch. Coronary atherosclerosis of the LAD. Mediastinum/Nodes: Small mediastinal lymph nodes, including a 6 mm short axis AP window node and an 8 mm short axis low right paratracheal node, within normal limits. No suspicious hilar or axillary lymphadenopathy. Visualized thyroid is unremarkable. Lungs/Pleura: Mild biapical pleural-parenchymal scarring. Mild vague centrilobular ground-glass nodularity, nonspecific but not suspicious for metastatic disease. This appearance may reflect RB ILD in a smoker. No  suspicious pulmonary nodules. No pleural effusion or pneumothorax. Musculoskeletal: Visualized osseous structures are within normal limits. CT ABDOMEN PELVIS FINDINGS Hepatobiliary: Liver is within normal limits. Gallbladder is unremarkable. No intrahepatic or extrahepatic ductal dilatation. Pancreas: Parenchymal atrophy. Spleen: Within normal limits. Adrenals/Urinary Tract: Adrenal glands are within normal limits. Kidneys are within normal limits.  No hydronephrosis. Bladder is within normal limits. Stomach/Bowel: Stomach is within normal limits. No evidence of bowel obstruction. Normal appendix (series 3/image 104). Vascular/Lymphatic: No evidence of abdominal aortic aneurysm. Atherosclerotic  calcifications of the abdominal aorta and branch vessels. No suspicious abdominopelvic lymphadenopathy. Reproductive: Uterus is within normal limits. Bilateral ovaries are within normal limits. Other: No abdominopelvic ascites. Tiny fat containing right inguinal hernia. Musculoskeletal: Visualized osseous structures are within normal limits. Mild subcutaneous stranding along the right anterior thigh (series 3/image 144). IMPRESSION: No evidence of metastatic disease in the chest, abdomen, or pelvis. Mild vague centrilobular ground-glass nodularity in the lungs bilaterally, nonspecific but possibly reflecting RB ILD in a smoker. This appearance is not considered suspicious for metastatic disease. Additional ancillary findings as above. Electronically Signed   By: Julian Hy M.D.   On: 09/11/2018 12:30   Ct Abdomen Pelvis W Contrast  Result Date: 09/11/2018 CLINICAL DATA:  Follow-up recurrent melanoma of the right lower extremity EXAM: CT CHEST, ABDOMEN, AND PELVIS WITH CONTRAST TECHNIQUE: Multidetector CT imaging of the chest, abdomen and pelvis was performed following the standard protocol during bolus administration of intravenous contrast. CONTRAST:  47m OMNIPAQUE IOHEXOL 300 MG/ML  SOLN COMPARISON:  PET-CT  dated 02/01/2018 FINDINGS: CT CHEST FINDINGS Cardiovascular: The heart is normal in size. No pericardial effusion. No evidence of thoracic aortic aneurysm. Mild atherosclerotic calcifications of the aortic arch. Coronary atherosclerosis of the LAD. Mediastinum/Nodes: Small mediastinal lymph nodes, including a 6 mm short axis AP window node and an 8 mm short axis low right paratracheal node, within normal limits. No suspicious hilar or axillary lymphadenopathy. Visualized thyroid is unremarkable. Lungs/Pleura: Mild biapical pleural-parenchymal scarring. Mild vague centrilobular ground-glass nodularity, nonspecific but not suspicious for metastatic disease. This appearance may reflect RB ILD in a smoker. No suspicious pulmonary nodules. No pleural effusion or pneumothorax. Musculoskeletal: Visualized osseous structures are within normal limits. CT ABDOMEN PELVIS FINDINGS Hepatobiliary: Liver is within normal limits. Gallbladder is unremarkable. No intrahepatic or extrahepatic ductal dilatation. Pancreas: Parenchymal atrophy. Spleen: Within normal limits. Adrenals/Urinary Tract: Adrenal glands are within normal limits. Kidneys are within normal limits.  No hydronephrosis. Bladder is within normal limits. Stomach/Bowel: Stomach is within normal limits. No evidence of bowel obstruction. Normal appendix (series 3/image 104). Vascular/Lymphatic: No evidence of abdominal aortic aneurysm. Atherosclerotic calcifications of the abdominal aorta and branch vessels. No suspicious abdominopelvic lymphadenopathy. Reproductive: Uterus is within normal limits. Bilateral ovaries are within normal limits. Other: No abdominopelvic ascites. Tiny fat containing right inguinal hernia. Musculoskeletal: Visualized osseous structures are within normal limits. Mild subcutaneous stranding along the right anterior thigh (series 3/image 144). IMPRESSION: No evidence of metastatic disease in the chest, abdomen, or pelvis. Mild vague centrilobular  ground-glass nodularity in the lungs bilaterally, nonspecific but possibly reflecting RB ILD in a smoker. This appearance is not considered suspicious for metastatic disease. Additional ancillary findings as above. Electronically Signed   By: SJulian HyM.D.   On: 09/11/2018 12:30    ONCOLOGIC HISTORY: Patient initially diagnosed with metastatic melanoma to right thigh with unknown primary in 2009. She received 4 weeks of high-dose interferon therapy in February 2009 and proceed with low-dose interferon therapy in March 2009. Patient remained in remission until August 2012 when she was noted to have recurrent melanoma and a pelvic lymph node. October 2012 she had 2 cycles of Ipilumimab which was discontinued secondary to poor tolerance. In April 2017 PET scan showed recurrent disease in the right inguinal region which was confirmed by biopsy. Zeboraf discontinued in November 2017 secondary to intolerable rash. Patient initiated combination immunotherapy with nivolumab and ipilumimab on January 14, 2016. Patient initiated maintenance nivolumab on April 14, 2016 and discontinued on November 07, 2016.  Nivolumab was discontinued secondary to persistent diarrhea.  Single agent oral Mekinist from December 07, 2016 through December 2018.  ASSESSMENT: Recurrent metastatic melanoma, BRAF+, of right lower extremity, metastatic.  PLAN:    1. Recurrent metastatic melanoma, BRAF+, of right lower extremity, metastatic: See oncology treatment history above. She completed palliative XRT to her iliac crest lesion on August 11, 2016.  PET scan was denied by insurance.  CT scan results from PET scan results from September 11, 2018 reviewed independently and reported as above with no obvious evidence of progressive or recurrent disease.  No intervention is needed at this time.  Patient does not require retreatment.  She expressed understanding that she still is at risk for recurrence.  She discontinued 2 mg trametinib  (Mekinist) daily secondary to rash. If this occurs, could possibly reinitiate Mekinist. Can also consider combination therapy using dabrafenib/trametinib in the future.  Return to clinic in 6 months with repeat imaging and further evaluation.   2. Pain: Resolved. Patient states she has discontinued Plaquenil.  She no longer takes hydrocodone.   3. Nausea: Resolved.  Continue Compazine as needed. 4. Diarrhea: Resolved with Remicade. At least grade 3 and refractory to steroids. Colonoscopy and EGD were unrevealing.  5. Pancreatitis: Resolved.  Unclear etiology. Continue follow-up with GI as indicated. 6. Elevated creatinine: Patient's most recent creatinine was reported at 1.80.  Continue follow-up with nephrology as indicated.  I provided 15 minutes of face-to-face video visit time during this encounter, and > 50% was spent counseling as documented under my assessment & plan.    Patient expressed understanding and was in agreement with this plan. She also understands that She can call clinic at any time with any questions, concerns, or complaints.    Lloyd Huger, MD 09/14/18 10:34 AM

## 2018-10-08 ENCOUNTER — Encounter: Payer: Self-pay | Admitting: Oncology

## 2018-10-25 ENCOUNTER — Other Ambulatory Visit: Payer: Self-pay | Admitting: *Deleted

## 2018-10-25 MED ORDER — PANTOPRAZOLE SODIUM 40 MG PO TBEC: 40.0000 mg | DELAYED_RELEASE_TABLET | Freq: Two times a day (BID) | ORAL | 1 refills | Status: DC

## 2019-01-16 DIAGNOSIS — N179 Acute kidney failure, unspecified: Secondary | ICD-10-CM | POA: Insufficient documentation

## 2019-02-28 ENCOUNTER — Other Ambulatory Visit: Payer: Self-pay | Admitting: Emergency Medicine

## 2019-02-28 ENCOUNTER — Telehealth: Payer: Self-pay | Admitting: Emergency Medicine

## 2019-02-28 DIAGNOSIS — C439 Malignant melanoma of skin, unspecified: Secondary | ICD-10-CM

## 2019-02-28 DIAGNOSIS — C799 Secondary malignant neoplasm of unspecified site: Secondary | ICD-10-CM

## 2019-02-28 NOTE — Telephone Encounter (Signed)
Pt called and left message with staff stating that her insurance company notified her that they were requesting a P2P for her PET scan approval due to the wording on her order. MD, scheduling, and authorization staff notified.

## 2019-03-06 ENCOUNTER — Other Ambulatory Visit: Payer: Self-pay | Admitting: Emergency Medicine

## 2019-03-06 ENCOUNTER — Encounter: Payer: Self-pay | Admitting: Oncology

## 2019-03-06 DIAGNOSIS — C439 Malignant melanoma of skin, unspecified: Secondary | ICD-10-CM

## 2019-03-06 DIAGNOSIS — C799 Secondary malignant neoplasm of unspecified site: Secondary | ICD-10-CM

## 2019-03-10 ENCOUNTER — Encounter: Payer: Self-pay | Admitting: Oncology

## 2019-03-11 ENCOUNTER — Telehealth: Payer: Medicare Other | Admitting: Oncology

## 2019-03-15 ENCOUNTER — Encounter
Admission: RE | Admit: 2019-03-15 | Discharge: 2019-03-15 | Disposition: A | Discharge status: Home / Self Care | Payer: Medicare Other | Source: Ambulatory Visit | Attending: Oncology | Admitting: Oncology

## 2019-03-15 ENCOUNTER — Other Ambulatory Visit: Payer: Self-pay

## 2019-03-15 DIAGNOSIS — E039 Hypothyroidism, unspecified: Secondary | ICD-10-CM | POA: Diagnosis not present

## 2019-03-15 DIAGNOSIS — M1711 Unilateral primary osteoarthritis, right knee: Secondary | ICD-10-CM | POA: Insufficient documentation

## 2019-03-15 DIAGNOSIS — I110 Hypertensive heart disease with heart failure: Secondary | ICD-10-CM | POA: Diagnosis not present

## 2019-03-15 DIAGNOSIS — Z79890 Hormone replacement therapy (postmenopausal): Secondary | ICD-10-CM | POA: Insufficient documentation

## 2019-03-15 DIAGNOSIS — Z79899 Other long term (current) drug therapy: Secondary | ICD-10-CM | POA: Insufficient documentation

## 2019-03-15 DIAGNOSIS — C799 Secondary malignant neoplasm of unspecified site: Secondary | ICD-10-CM | POA: Diagnosis present

## 2019-03-15 DIAGNOSIS — F17210 Nicotine dependence, cigarettes, uncomplicated: Secondary | ICD-10-CM | POA: Diagnosis not present

## 2019-03-15 DIAGNOSIS — I509 Heart failure, unspecified: Secondary | ICD-10-CM | POA: Insufficient documentation

## 2019-03-15 DIAGNOSIS — C439 Malignant melanoma of skin, unspecified: Secondary | ICD-10-CM

## 2019-03-15 LAB — GLUCOSE, CAPILLARY: Glucose-Capillary: 88 mg/dL (ref 70–99)

## 2019-03-15 MED ORDER — FLUDEOXYGLUCOSE F - 18 (FDG) INJECTION
7.5600 | Freq: Once | INTRAVENOUS | Status: AC | PRN
  Administered 2019-03-15: 7.56 via INTRAVENOUS

## 2019-03-15 NOTE — Progress Notes (Signed)
[place]  [phone]) [phone] [phone]) [phone]  ID: Patricia Li OB: May 03, 1960  MR#: [id number]  JTT#:[id number]  Patient Care Team: Patient, No Pcp Per as PCP - General ([place]) Bary Castilla, Forest Gleason, MD as Consulting Physician (General Surgery) Forest Gleason, MD (Inactive) (Oncology) Lloyd Huger, MD as Consulting Physician (Oncology)  I connected with Patricia Li on 03/19/19 at  3:45 PM EST by video enabled telemedicine visit and verified that I am speaking with the correct person using two identifiers.   I discussed the limitations, risks, security and privacy concerns of performing an evaluation and management service by telemedicine and the availability of in-person appointments. I also discussed with the patient that there may be a patient responsible charge related to this service. The patient expressed understanding and agreed to proceed.   Other persons participating in the visit and their role in the encounter: Patient, MD.  Patient's location: Home. Provider's location: Clinic.  CHIEF COMPLAINT: Recurrent metastatic melanoma, BRAF+, of right lower extremity, metastatic.  INTERVAL HISTORY: Patient agreed to video assisted telemedicine visit for further evaluation and discussion of her imaging results.  She continues to feel well and remains asymptomatic. She denies any pain. She does not complain of weakness or fatigue.  She has no neurologic complaints. She denies any recent fevers or illnesses. She has a good appetite.  She denies any chest pain, shortness of breath, cough, or hemoptysis.  She has no nausea, vomiting, diarrhea, or constipation. She denies any joint pain.  She has no urinary complaints.  Patient feels at her baseline offers no specific complaints today.  REVIEW OF SYSTEMS:   Review of Systems  Constitutional: Negative.  Negative for fever, malaise/fatigue and weight loss.  HENT: Negative.  Negative for ear  pain and tinnitus.   Eyes: Negative.   Respiratory: Negative.  Negative for cough and shortness of breath.   Cardiovascular: Negative.  Negative for chest pain and leg swelling.  Gastrointestinal: Negative for abdominal pain, blood in stool, diarrhea, heartburn and melena.  Genitourinary: Negative.  Negative for dysuria.  Musculoskeletal: Negative.  Negative for back pain and joint pain.  Skin: Negative.  Negative for itching and rash.  Neurological: Negative.  Negative for sensory change, focal weakness and weakness.  Endo/Heme/Allergies: Does not bruise/bleed easily.  Psychiatric/Behavioral: Negative.  Negative for depression. The patient is not nervous/anxious and does not have insomnia.     As per HPI. Otherwise, a complete review of systems is negative.  PAST MEDICAL HISTORY: Past Medical History:  Diagnosis Date  . Adrenal insufficiency ([place])   . Bipolar disorder ([place])   . Cardiomyopathy ([place])   . CHF (congestive heart failure) ([place]) 2016   Dr. Nehemiah Massed  . Depression   . GERD (gastroesophageal reflux disease) 2012  . Hypertension   . Hypothyroidism   . Joint pain   . Lumbar herniated disc 2010  . Malignant melanoma of skin of lower limb, including hip ([place]) 2008, 2017   right thigh/2017- stage 4  . Osteoporosis, postmenopausal   . Overactive bladder   . Pancreatitis   . Panhypopituitarism ([place])   . Personal history of chemotherapy 2008   melonoma  . Personal history of malignant melanoma of skin 2012  . Personal history of radiation therapy 2017   melonoma  . Rectal bleeding   . Renal insufficiency   . Supraglottitis   . Tendonitis   . Tick bite of head 07/20/2015  . Weight loss, unintentional     PAST  SURGICAL HISTORY: Past Surgical History:  Procedure Laterality Date  . BACK SURGERY  2010  . BREAST CYST EXCISION  1985  . COLONOSCOPY  07/2012   [facility]  . COLONOSCOPY WITH PROPOFOL N/A 11/21/2016   Procedure: COLONOSCOPY WITH PROPOFOL;  Surgeon: Manya Silvas, MD;  Location[facility]COPY;  Service: Endoscopy;  Laterality: N/A;  . ESOPHAGOGASTRODUODENOSCOPY    . ESOPHAGOGASTRODUODENOSCOPY (EGD) WITH PROPOFOL N/A 11/21/2016   Procedure: ESOPHAGOGASTRODUODENOSCOPY (EGD) WITH PROPOFOL;  Surgeon: Manya Silvas, MD;  Location: [facility] ENDOSCOPY;  Service: Endoscopy;  Laterality: N/A;  . FRACTURE SURGERY     rod removed from left femur 07/21/2014  . INGUINAL LYMPH NODE BIOPSY Right 07/17/2015   Procedure: INGUINAL LYMPH NODE BIOPSY/EXCISION;  Surgeon: Robert Bellow, MD;  Location: [facility] ORS;  Service: General;  Laterality: Right;  . laminnotomy reexploration posterior lumbar   12/26/2008   with nerve decomp and discectomy  . MELANOMA EXCISION  2008   right thigh  . PORTACATH PLACEMENT Left 03/14/2016   Procedure: INSERTION PORT-A-CATH;  Surgeon: Robert Bellow, MD;  Location: [facility] ORS;  Service: General;  Laterality: Left;  . SKIN LESION EXCISION Right 12-19-10   posterior  . SPINE SURGERY      FAMILY HISTORY: Family History  Problem Relation Age of Onset  . COPD Father   . Cancer Sister        Colon, sister  . Breast cancer Neg Hx        ADVANCED DIRECTIVES:    HEALTH MAINTENANCE: Social History   Tobacco Use  . Smoking status: Current Every Day Smoker    Packs/day: 0.50    Years: 20.00    Pack years: 10.00    Types: Cigarettes  . Smokeless tobacco: Never Used  Substance Use Topics  . Alcohol use: Yes    Alcohol/week: 0.0 standard drinks    Comment: ocassionally  . Drug use: No     Colonoscopy:  PAP:  Bone density:  Lipid panel:  Allergies  Allergen Reactions  . Clindamycin Shortness Of Breath  . Levaquin [Levofloxacin] Other (See Comments)    Joint inflammation  . Aripiprazole Other (See Comments)    Facial and head ticks  . Depakote [Divalproex Sodium] Other (See Comments)  . Valproic Acid     Other reaction(s): Other (See Comments) Facial twitching  . Ondansetron Hcl Nausea And Vomiting  .  Promethazine Nausea And Vomiting    Current Outpatient Medications  Medication Sig Dispense Refill  . calcitRIOL (ROCALTROL) 0.25 MCG capsule Take 0.25 mcg by mouth daily.  6  . cetirizine (ZYRTEC ALLERGY) 10 MG tablet Take 10 mg by mouth daily.    . clonazePAM (KLONOPIN) 0.5 MG tablet Take 0.5 mg by mouth at bedtime.    . hydrocortisone (CORTEF) 10 MG tablet Take 10-15 mg by mouth 2 (two) times daily. 1.5 tablets (15 MG) in the morning and 1 tablet (10 MG) in the evening    . levothyroxine (SYNTHROID, LEVOTHROID) 137 MCG tablet Take 150 mcg by mouth daily.     . Metoclopramide HCl (REGLAN PO) Take by mouth.    . Multiple Vitamin (MULTI-VITAMIN) tablet Take 1 tablet by mouth 1 day or 1 dose.    . pantoprazole (PROTONIX) 40 MG tablet Take 1 tablet (40 mg total) by mouth 2 (two) times daily. 180 tablet 1  . pregabalin (LYRICA) 50 MG capsule Take 25 mg by mouth 3 (three) times daily.    . QUEtiapine (SEROQUEL) 25 MG tablet Take 1  tablet by mouth at bedtime.     No current facility-administered medications for this visit.    OBJECTIVE: Vitals:     There is no height or weight on file to calculate BMI.    ECOG FS:0 - Asymptomatic  General: Well-developed, well-nourished, no acute distress. HEENT: Normocephalic. Neuro: Alert, answering all questions appropriately. Cranial nerves grossly intact. Psych: Normal affect.   LAB RESULTS:  Lab Results  Component Value Date   NA 135 01/12/2018   K 3.8 01/12/2018   CL 105 01/12/2018   CO2 24 01/12/2018   GLUCOSE 139 (H) 01/12/2018   BUN 12 01/12/2018   CREATININE 1.80 (H) 09/11/2018   CALCIUM 8.3 (L) 01/12/2018   PROT 5.5 (L) 01/12/2018   ALBUMIN 3.3 (L) 01/12/2018   AST 28 01/12/2018   ALT 14 01/12/2018   ALKPHOS 62 01/12/2018   BILITOT 0.3 01/12/2018   GFRNONAA 37 (L) 01/12/2018   GFRAA 42 (L) 01/12/2018    Lab Results  Component Value Date   WBC 4.1 01/12/2018   NEUTROABS 2.2 01/12/2018   HGB 11.0 (L) 01/12/2018   HCT 33.7  (L) 01/12/2018   MCV 93.4 01/12/2018   PLT 221 01/12/2018    STUDIES: NM PET Image Restage (PS) Whole Body  Result Date: 03/15/2019 CLINICAL DATA:  Subsequent treatment strategy for melanoma. EXAM: NUCLEAR MEDICINE PET WHOLE BODY TECHNIQUE: 7.56 mCi F-18 FDG was injected intravenously. Full-ring PET imaging was performed from the skull base to thigh after the radiotracer. CT data was obtained and used for attenuation correction and anatomic localization. Fasting blood glucose: 88 mg/dl COMPARISON:  Multiple prior studies, most recent exam is from September 11, 2018 and PET scan from 02/01/2018 FINDINGS: Mediastinal blood pool activity: SUV max 2.3 HEAD/NECK: No hypermetabolic activity in the scalp. No hypermetabolic cervical lymph nodes. Incidental CT findings: none CHEST: No hypermetabolic mediastinal or hilar nodes. No suspicious pulmonary nodules on the CT scan. Incidental CT findings: Scattered atherosclerotic changes in the thoracic aorta. Scattered coronary artery calcifications. No pericardial effusion. Lungs are clear without signs of consolidation or pleural effusion. Basilar atelectasis. ABDOMEN/PELVIS: No abnormal hypermetabolic activity within the liver, pancreas, adrenal glands, or spleen. No hypermetabolic lymph nodes in the abdomen or pelvis. Incidental CT findings: Signs of renal cortical scarring. No acute process in the abdomen or pelvis. Insinuation of small-bowel loops between rectum and vagina with bowing of levator ani musculature compatible with pelvic floor dysfunction and with possible enterocele Stable appearance of right external iliac lymph node showing calcification. 1 cm short axis SKELETON: Post procedural changes in the right groin and right lower extremity with similar appearance. Marked worsening of degenerative changes about the right knee with posterolateral area of lucency that is developed in the lateral tibial plateau measuring w 1.4 x 6.9 x 1.1 cm, showing well-defined  sclerotic margins and only minimally increased FDG uptake (SUVmax = 3.3 No area of suspicious uptake that would be diagnostic of metastatic disease to the visualized skeleton.) Incidental CT findings: Spinal degenerative changes. Signs of previous left femoral ORIF with evidence of previous femoral nailing. EXTREMITIES: As above Incidental CT findings: Degenerative changes and signs of previous fracture as described above. IMPRESSION: 1. No findings to indicate metastatic disease 2. What is likely worsening degenerative change and subchondral cyst formation in the right knee. Correlate with any worsening pain to determine whether further imaging may be warranted. 3. Incidental findings of pelvic floor dysfunction as described. 4. Resolution of hypermetabolic activity along the anterior costochondral junction. Electronically Signed  By: Zetta Bills M.D.   On: 03/15/2019 16:35    ONCOLOGIC HISTORY: Patient initially diagnosed with metastatic melanoma to right thigh with unknown primary in 2009. She received 4 weeks of high-dose interferon therapy in February 2009 and proceed with low-dose interferon therapy in March 2009. Patient remained in remission until August 2012 when she was noted to have recurrent melanoma and a pelvic lymph node. October 2012 she had 2 cycles of Ipilumimab which was discontinued secondary to poor tolerance. In April 2017 PET scan showed recurrent disease in the right inguinal region which was confirmed by biopsy. Zeboraf discontinued in November 2017 secondary to intolerable rash. Patient initiated combination immunotherapy with nivolumab and ipilumimab on January 14, 2016. Patient initiated maintenance nivolumab on April 14, 2016 and discontinued on November 07, 2016.  Nivolumab was discontinued secondary to persistent diarrhea.  Single agent oral Mekinist from December 07, 2016 through December 2018.  ASSESSMENT: Recurrent metastatic melanoma, BRAF+, of right lower extremity,  metastatic.  PLAN:    1. Recurrent metastatic melanoma, BRAF+, of right lower extremity, metastatic: See oncology treatment history above. She completed palliative XRT to her iliac crest lesion on August 11, 2016.  PET scan results PET scan was denied by insurance.  PET scan results from March 15, 2019 reviewed independently and report as above with no obvious evidence of recurrent or progressive disease.  No intervention is needed at this time.  Patient does not require additional treatment.  She expressed understanding that she still is at risk for recurrence.  She discontinued 2 mg trametinib (Mekinist) daily secondary to rash. If this occurs, could possibly reinitiate Mekinist. Can also consider combination therapy using dabrafenib/trametinib in the future.  Patient is now greater than 2 years out from completing treatment and can be switched to CT scans every 6 months.  Return to clinic in 6 months with repeat imaging using CT scan and further evaluation.  2. Pain: Resolved. Patient states she has discontinued Plaquenil.  She no longer takes hydrocodone.   3. Nausea: Resolved.  Continue Compazine as needed. 4. Diarrhea: Resolved with Remicade. At least grade 3 and refractory to steroids. Colonoscopy and EGD were unrevealing.  5. Pancreatitis: Resolved.  Unclear etiology.  Patient no longer has follow-up with GI. 6. Elevated creatinine: Patient's most recent creatinine was reported at 1.80.  Continue follow-up with nephrology as indicated.  I provided 15 minutes of face-to-face video visit time during this encounter which included chart review. Greater than 50% was spent counseling as documented under my assessment & plan.   Patient expressed understanding and was in agreement with this plan. She also understands that She can call clinic at any time with any questions, concerns, or complaints.    Lloyd Huger, MD 03/19/19 10:47 AM

## 2019-03-18 ENCOUNTER — Other Ambulatory Visit: Payer: Self-pay

## 2019-03-18 ENCOUNTER — Encounter: Payer: Self-pay | Admitting: Oncology

## 2019-03-18 ENCOUNTER — Inpatient Hospital Stay: Payer: Medicare Other | Attending: Oncology | Admitting: Oncology

## 2019-03-18 DIAGNOSIS — C799 Secondary malignant neoplasm of unspecified site: Secondary | ICD-10-CM | POA: Diagnosis not present

## 2019-03-18 DIAGNOSIS — C439 Malignant melanoma of skin, unspecified: Secondary | ICD-10-CM

## 2019-03-18 NOTE — Progress Notes (Signed)
Patient prescreened for appointment. Patient has no concerns or questions.  

## 2019-03-19 ENCOUNTER — Ambulatory Visit: Payer: Medicare Other

## 2019-03-22 ENCOUNTER — Telehealth: Payer: Medicare Other | Admitting: Oncology

## 2019-04-23 ENCOUNTER — Other Ambulatory Visit: Payer: Self-pay | Admitting: Oncology

## 2019-08-05 ENCOUNTER — Encounter: Payer: Self-pay | Admitting: Oncology

## 2019-08-06 ENCOUNTER — Encounter: Payer: Self-pay | Admitting: Oncology

## 2019-08-16 ENCOUNTER — Other Ambulatory Visit: Payer: Self-pay | Admitting: Emergency Medicine

## 2019-08-16 DIAGNOSIS — C439 Malignant melanoma of skin, unspecified: Secondary | ICD-10-CM

## 2019-08-19 ENCOUNTER — Other Ambulatory Visit: Payer: Self-pay

## 2019-08-19 ENCOUNTER — Ambulatory Visit
Admission: RE | Admit: 2019-08-19 | Discharge: 2019-08-19 | Disposition: A | Discharge status: Home / Self Care | Payer: Medicare Other | Source: Ambulatory Visit | Attending: Oncology | Admitting: Oncology

## 2019-08-19 ENCOUNTER — Inpatient Hospital Stay: Payer: Medicare Other | Attending: Oncology

## 2019-08-19 DIAGNOSIS — F17210 Nicotine dependence, cigarettes, uncomplicated: Secondary | ICD-10-CM | POA: Diagnosis not present

## 2019-08-19 DIAGNOSIS — Z800 Family history of malignant neoplasm of digestive organs: Secondary | ICD-10-CM | POA: Insufficient documentation

## 2019-08-19 DIAGNOSIS — C439 Malignant melanoma of skin, unspecified: Secondary | ICD-10-CM | POA: Insufficient documentation

## 2019-08-19 DIAGNOSIS — C799 Secondary malignant neoplasm of unspecified site: Secondary | ICD-10-CM | POA: Insufficient documentation

## 2019-08-19 DIAGNOSIS — K219 Gastro-esophageal reflux disease without esophagitis: Secondary | ICD-10-CM | POA: Insufficient documentation

## 2019-08-19 DIAGNOSIS — M25552 Pain in left hip: Secondary | ICD-10-CM | POA: Insufficient documentation

## 2019-08-19 DIAGNOSIS — E039 Hypothyroidism, unspecified: Secondary | ICD-10-CM | POA: Insufficient documentation

## 2019-08-19 DIAGNOSIS — Z803 Family history of malignant neoplasm of breast: Secondary | ICD-10-CM | POA: Insufficient documentation

## 2019-08-19 DIAGNOSIS — I700 Atherosclerosis of aorta: Secondary | ICD-10-CM | POA: Insufficient documentation

## 2019-08-19 DIAGNOSIS — R918 Other nonspecific abnormal finding of lung field: Secondary | ICD-10-CM | POA: Diagnosis not present

## 2019-08-19 DIAGNOSIS — I509 Heart failure, unspecified: Secondary | ICD-10-CM | POA: Insufficient documentation

## 2019-08-19 DIAGNOSIS — I2510 Atherosclerotic heart disease of native coronary artery without angina pectoris: Secondary | ICD-10-CM | POA: Insufficient documentation

## 2019-08-19 DIAGNOSIS — R7989 Other specified abnormal findings of blood chemistry: Secondary | ICD-10-CM | POA: Insufficient documentation

## 2019-08-19 DIAGNOSIS — F329 Major depressive disorder, single episode, unspecified: Secondary | ICD-10-CM | POA: Diagnosis not present

## 2019-08-19 DIAGNOSIS — R197 Diarrhea, unspecified: Secondary | ICD-10-CM | POA: Diagnosis not present

## 2019-08-19 DIAGNOSIS — I429 Cardiomyopathy, unspecified: Secondary | ICD-10-CM | POA: Insufficient documentation

## 2019-08-19 DIAGNOSIS — I110 Hypertensive heart disease with heart failure: Secondary | ICD-10-CM | POA: Insufficient documentation

## 2019-08-19 DIAGNOSIS — Z79899 Other long term (current) drug therapy: Secondary | ICD-10-CM | POA: Insufficient documentation

## 2019-08-19 DIAGNOSIS — Z9221 Personal history of antineoplastic chemotherapy: Secondary | ICD-10-CM | POA: Insufficient documentation

## 2019-08-19 DIAGNOSIS — C4371 Malignant melanoma of right lower limb, including hip: Secondary | ICD-10-CM | POA: Diagnosis not present

## 2019-08-19 DIAGNOSIS — Z923 Personal history of irradiation: Secondary | ICD-10-CM | POA: Insufficient documentation

## 2019-08-19 DIAGNOSIS — F319 Bipolar disorder, unspecified: Secondary | ICD-10-CM | POA: Diagnosis not present

## 2019-08-19 LAB — COMPREHENSIVE METABOLIC PANEL WITH GFR
ALT: 16 U/L (ref 0–44)
AST: 28 U/L (ref 15–41)
Albumin: 3.9 g/dL (ref 3.5–5.0)
Alkaline Phosphatase: 55 U/L (ref 38–126)
Anion gap: 8 (ref 5–15)
BUN: 12 mg/dL (ref 6–20)
CO2: 27 mmol/L (ref 22–32)
Calcium: 8.6 mg/dL — ABNORMAL LOW (ref 8.9–10.3)
Chloride: 100 mmol/L (ref 98–111)
Creatinine, Ser: 1.48 mg/dL — ABNORMAL HIGH (ref 0.44–1.00)
GFR calc Af Amer: 45 mL/min — ABNORMAL LOW
GFR calc non Af Amer: 39 mL/min — ABNORMAL LOW
Glucose, Bld: 80 mg/dL (ref 70–99)
Potassium: 4.3 mmol/L (ref 3.5–5.1)
Sodium: 135 mmol/L (ref 135–145)
Total Bilirubin: 0.6 mg/dL (ref 0.3–1.2)
Total Protein: 6.4 g/dL — ABNORMAL LOW (ref 6.5–8.1)

## 2019-08-19 LAB — CBC WITH DIFFERENTIAL/PLATELET
Abs Immature Granulocytes: 0.01 10*3/uL (ref 0.00–0.07)
Basophils Absolute: 0 10*3/uL (ref 0.0–0.1)
Basophils Relative: 0 %
Eosinophils Absolute: 0.2 10*3/uL (ref 0.0–0.5)
Eosinophils Relative: 4 %
HCT: 35.9 % — ABNORMAL LOW (ref 36.0–46.0)
Hemoglobin: 12.5 g/dL (ref 12.0–15.0)
Immature Granulocytes: 0 %
Lymphocytes Relative: 36 %
Lymphs Abs: 1.7 10*3/uL (ref 0.7–4.0)
MCH: 31.1 pg (ref 26.0–34.0)
MCHC: 34.8 g/dL (ref 30.0–36.0)
MCV: 89.3 fL (ref 80.0–100.0)
Monocytes Absolute: 0.4 10*3/uL (ref 0.1–1.0)
Monocytes Relative: 8 %
Neutro Abs: 2.4 10*3/uL (ref 1.7–7.7)
Neutrophils Relative %: 52 %
Platelets: 198 10*3/uL (ref 150–400)
RBC: 4.02 MIL/uL (ref 3.87–5.11)
RDW: 12.5 % (ref 11.5–15.5)
WBC: 4.6 10*3/uL (ref 4.0–10.5)
nRBC: 0 % (ref 0.0–0.2)

## 2019-08-19 MED ORDER — IOHEXOL 300 MG/ML  SOLN
85.0000 mL | Freq: Once | INTRAMUSCULAR | Status: AC | PRN
  Administered 2019-08-19: 85 mL via INTRAVENOUS

## 2019-08-22 ENCOUNTER — Other Ambulatory Visit: Payer: Self-pay | Admitting: Oncology

## 2019-08-22 DIAGNOSIS — Z1231 Encounter for screening mammogram for malignant neoplasm of breast: Secondary | ICD-10-CM

## 2019-08-22 NOTE — Progress Notes (Signed)
[place]  [phone] [phone]  ID: Patricia Li OB: [birth year]  MR#: [id number]  TWS#:[id number]  Patient Care Team: System, Provider Not In as PCP - General Byrnett, Forest Gleason, MD as Consulting Physician (General Surgery) Forest Gleason, MD (Inactive) (Oncology) Lloyd Huger, MD as Consulting Physician (Oncology)   CHIEF COMPLAINT: Recurrent metastatic melanoma, BRAF+, of right lower extremity, metastatic.  INTERVAL HISTORY: Patient returns to clinic today for further evaluation and discussion of her imaging results.  She is having left hip pain over the past 1 to 2 months, but otherwise feels well.  She does not complain of weakness or fatigue.  She has no neurologic complaints. She denies any recent fevers or illnesses. She has a good appetite.  She denies any chest pain, shortness of breath, cough, or hemoptysis.  She has no nausea, vomiting, diarrhea, or constipation. She denies any joint pain.  She has no urinary complaints.  Patient offers no further specific complaints today.  REVIEW OF SYSTEMS:   Review of Systems  Constitutional: Negative.  Negative for fever, malaise/fatigue and weight loss.  HENT: Negative.  Negative for ear pain and tinnitus.   Eyes: Negative.   Respiratory: Negative.  Negative for cough and shortness of breath.   Cardiovascular: Negative.  Negative for chest pain and leg swelling.  Gastrointestinal: Negative for abdominal pain, blood in stool, diarrhea, heartburn and melena.  Genitourinary: Negative.  Negative for dysuria.  Musculoskeletal: Negative.  Negative for back pain and joint pain.  Skin: Negative.  Negative for itching and rash.  Neurological: Negative.  Negative for sensory change, focal weakness and weakness.  Endo/Heme/Allergies: Does not bruise/bleed easily.  Psychiatric/Behavioral: Negative.  Negative for depression. The patient is not nervous/anxious and does not have insomnia.     As  per HPI. Otherwise, a complete review of systems is negative.  PAST MEDICAL HISTORY: Past Medical History:  Diagnosis Date  . Adrenal insufficiency ([place])   . Bipolar disorder ([place])   . Cardiomyopathy ([place])   . CHF (congestive heart failure) ([place]) 2016   Dr. Nehemiah Massed  . Depression   . GERD (gastroesophageal reflux disease) 2012  . Hypertension   . Hypothyroidism   . Joint pain   . Lumbar herniated disc 2010  . Malignant melanoma of skin of lower limb, including hip ([place]) 2008, 2012, 2017   right thigh/2017- stage 4  . Osteoporosis, postmenopausal   . Overactive bladder   . Pancreatitis   . Panhypopituitarism ([place])   . Personal history of chemotherapy 2008   melonoma  . Personal history of malignant melanoma of skin 2012  . Personal history of radiation therapy 2017   melonoma  . Rectal bleeding   . Renal insufficiency   . Supraglottitis   . Tendonitis   . Tick bite of head 07/20/2015  . Weight loss, unintentional     PAST SURGICAL HISTORY: Past Surgical History:  Procedure Laterality Date  . BACK SURGERY  2010  . BREAST CYST EXCISION  1985  . COLONOSCOPY  07/2012   [facility]  . COLONOSCOPY WITH PROPOFOL N/A 11/21/2016   Procedure: COLONOSCOPY WITH PROPOFOL;  Surgeon: Manya Silvas, MD;  Location: [facility] ENDOSCOPY;  Service: Endoscopy;  Laterality: N/A;  . ESOPHAGOGASTRODUODENOSCOPY    . ESOPHAGOGASTRODUODENOSCOPY (EGD) WITH PROPOFOL N/A 11/21/2016   Procedure: ESOPHAGOGASTRODUODENOSCOPY (EGD) WITH PROPOFOL;  Surgeon: Manya Silvas, MD;  Location: [facility] ENDOSCOPY;  Service: Endoscopy;  Laterality: N/A;  . FRACTURE SURGERY     rod removed from  left femur 07/21/2014  . INGUINAL LYMPH NODE BIOPSY Right 07/17/2015   Procedure: INGUINAL LYMPH NODE BIOPSY/EXCISION;  Surgeon: Robert Bellow, MD;  Location: [facility] ORS;  Service: General;  Laterality: Right;  . laminnotomy reexploration posterior lumbar   12/26/2008   with nerve decomp and discectomy  . MELANOMA EXCISION  2008    right thigh  . PORTACATH PLACEMENT Left 03/14/2016   Procedure: INSERTION PORT-A-CATH;  Surgeon: Robert Bellow, MD;  Location: [facility] ORS;  Service: General;  Laterality: Left;  . SKIN LESION EXCISION Right 12-19-10   posterior  . SPINE SURGERY      FAMILY HISTORY: Family History  Problem Relation Age of Onset  . COPD Father   . Cancer Sister        Colon, sister  . Breast cancer Neg Hx        ADVANCED DIRECTIVES:    HEALTH MAINTENANCE: Social History   Tobacco Use  . Smoking status: Current Every Day Smoker    Packs/day: 0.50    Years: 20.00    Pack years: 10.00    Types: Cigarettes  . Smokeless tobacco: Never Used  Vaping Use  . Vaping Use: Never used  Substance Use Topics  . Alcohol use: Yes    Alcohol/week: 0.0 standard drinks    Comment: ocassionally  . Drug use: No     Colonoscopy:  PAP:  Bone density:  Lipid panel:  Allergies  Allergen Reactions  . Clindamycin Shortness Of Breath  . Levaquin [Levofloxacin] Other (See Comments)    Joint inflammation  . Aripiprazole Other (See Comments)    Facial and head ticks  . Depakote [Divalproex Sodium] Other (See Comments)  . Valproic Acid     Other reaction(s): Other (See Comments) Facial twitching  . Ondansetron Hcl Nausea And Vomiting  . Promethazine Nausea And Vomiting    Current Outpatient Medications  Medication Sig Dispense Refill  . calcitRIOL (ROCALTROL) 0.25 MCG capsule Take 0.25 mcg by mouth daily.  6  . cetirizine (ZYRTEC ALLERGY) 10 MG tablet Take 10 mg by mouth daily.    . clonazePAM (KLONOPIN) 0.5 MG tablet Take 0.5 mg by mouth at bedtime.    . hydrocortisone (CORTEF) 10 MG tablet Take 10-15 mg by mouth 2 (two) times daily. 1.5 tablets (15 MG) in the morning and 1 tablet (10 MG) in the evening    . levothyroxine (SYNTHROID, LEVOTHROID) 137 MCG tablet Take 150 mcg by mouth daily.     . Metoclopramide HCl (REGLAN PO) Take by mouth.    . Multiple Vitamin (MULTI-VITAMIN) tablet Take 1  tablet by mouth 1 day or 1 dose.    . pantoprazole (PROTONIX) 40 MG tablet TAKE 1 TABLET BY MOUTH TWICE A DAY 180 tablet 1  . pregabalin (LYRICA) 50 MG capsule Take 25 mg by mouth 3 (three) times daily.    . QUEtiapine (SEROQUEL) 25 MG tablet Take 1 tablet by mouth at bedtime.    . rosuvastatin (CRESTOR) 5 MG tablet Take 5 mg by mouth daily.    Marland Kitchen venlafaxine XR (EFFEXOR-XR) 150 MG 24 hr capsule Take 1 capsule by mouth 2 (two) times daily.     No current facility-administered medications for this visit.    OBJECTIVE: Vitals:   08/26/19 1004  BP: 126/69  Pulse: 69  Resp: 20  Temp: (!) 97.5 F (36.4 C)  SpO2: 100%     Body mass index is 21.3 kg/m.    ECOG FS:0 - Asymptomatic  General: Well-developed, well-nourished,  no acute distress. Eyes: Pink conjunctiva, anicteric sclera. HEENT: Normocephalic, moist mucous membranes. Lungs: No audible wheezing or coughing. Heart: Regular rate and rhythm. Abdomen: Soft, nontender, no obvious distention. Musculoskeletal: No edema, cyanosis, or clubbing. Neuro: Alert, answering all questions appropriately. Cranial nerves grossly intact. Skin: No rashes or petechiae noted. Psych: Normal affect.   LAB RESULTS:  Lab Results  Component Value Date   NA 135 08/19/2019   K 4.3 08/19/2019   CL 100 08/19/2019   CO2 27 08/19/2019   GLUCOSE 80 08/19/2019   BUN 12 08/19/2019   CREATININE 1.48 (H) 08/19/2019   CALCIUM 8.6 (L) 08/19/2019   PROT 6.4 (L) 08/19/2019   ALBUMIN 3.9 08/19/2019   AST 28 08/19/2019   ALT 16 08/19/2019   ALKPHOS 55 08/19/2019   BILITOT 0.6 08/19/2019   GFRNONAA 39 (L) 08/19/2019   GFRAA 45 (L) 08/19/2019    Lab Results  Component Value Date   WBC 4.6 08/19/2019   NEUTROABS 2.4 08/19/2019   HGB 12.5 08/19/2019   HCT 35.9 (L) 08/19/2019   MCV 89.3 08/19/2019   PLT 198 08/19/2019    STUDIES: CT CHEST W CONTRAST  Result Date: 08/20/2019 CLINICAL DATA:  Metastatic melanoma, restaging. Surgical resection in  2008. Chemotherapy and surgical resection in 2012. Two thousand seventeen patient received chemotherapy and radiation therapy. EXAM: CT CHEST, ABDOMEN, AND PELVIS WITH CONTRAST TECHNIQUE: Multidetector CT imaging of the chest, abdomen and pelvis was performed following the standard protocol during bolus administration of intravenous contrast. CONTRAST:  45m OMNIPAQUE IOHEXOL 300 MG/ML  SOLN COMPARISON:  PET-CT 03/15/2019 and CT cap 09/11/2018 FINDINGS: CT CHEST FINDINGS Cardiovascular: Normal heart size. No pericardial effusion. Aortic atherosclerosis. Lad coronary artery calcification. Mediastinum/Nodes: Normal appearance of the thyroid gland. The trachea appears patent and is midline. Normal appearance of the esophagus., supraclavicular, or axillary adenopathy. Lungs/Pleura: No pleural effusion identified. No airspace consolidation, atelectasis, or pneumothorax. Stable 2 mm left upper lobe lung nodule, image 60/3. 2 mm peripheral nodule in the lateral left upper lobe is unchanged, image 40/3. Other scattered tiny lung nodules are also unchanged all measuring less than 5 mm. Musculoskeletal: No chest wall mass or suspicious bone lesions identified. CT ABDOMEN PELVIS FINDINGS Hepatobiliary: No focal liver abnormality is seen. No gallstones, gallbladder wall thickening, or biliary dilatation. Pancreas: There is atrophy of the pancreas with relative sparing of the distal tail. No pancreatic inflammation, main duct dilatation or mass. Spleen: Normal in size without focal abnormality. Adrenals/Urinary Tract: Normal appearance of the adrenal glands. Kidneys are unremarkable. No mass or hydronephrosis. The bladder is unremarkable. Stomach/Bowel: Stomach is within normal limits. Appendix appears normal. No evidence of bowel wall thickening, distention, or inflammatory changes. Vascular/Lymphatic: Aortic atherosclerosis without aneurysm. No enlarged lymph nodes within the abdomen or pelvis. No inguinal adenopathy.  Reproductive: Uterus and bilateral adnexa are unremarkable. Other: No free fluid or fluid collections. Musculoskeletal: No acute or significant osseous findings. IMPRESSION: 1. No acute findings within the chest, abdomen or pelvis. No specific findings identified to suggest residual or recurrence of tumor. 2. Tiny, less than 5 mm, nonspecific pulmonary nodules are unchanged from 09/11/2018. 3. Aortic atherosclerosis and coronary artery calcification. Aortic Atherosclerosis (ICD10-I70.0). Electronically Signed   By: TKerby MoorsM.D.   On: 08/20/2019 12:17   CT ABDOMEN PELVIS W CONTRAST  Result Date: 08/20/2019 CLINICAL DATA:  Metastatic melanoma, restaging. Surgical resection in 2008. Chemotherapy and surgical resection in 2012. Two thousand seventeen patient received chemotherapy and radiation therapy. EXAM: CT CHEST, ABDOMEN, AND  PELVIS WITH CONTRAST TECHNIQUE: Multidetector CT imaging of the chest, abdomen and pelvis was performed following the standard protocol during bolus administration of intravenous contrast. CONTRAST:  38m OMNIPAQUE IOHEXOL 300 MG/ML  SOLN COMPARISON:  PET-CT 03/15/2019 and CT cap 09/11/2018 FINDINGS: CT CHEST FINDINGS Cardiovascular: Normal heart size. No pericardial effusion. Aortic atherosclerosis. Lad coronary artery calcification. Mediastinum/Nodes: Normal appearance of the thyroid gland. The trachea appears patent and is midline. Normal appearance of the esophagus., supraclavicular, or axillary adenopathy. Lungs/Pleura: No pleural effusion identified. No airspace consolidation, atelectasis, or pneumothorax. Stable 2 mm left upper lobe lung nodule, image 60/3. 2 mm peripheral nodule in the lateral left upper lobe is unchanged, image 40/3. Other scattered tiny lung nodules are also unchanged all measuring less than 5 mm. Musculoskeletal: No chest wall mass or suspicious bone lesions identified. CT ABDOMEN PELVIS FINDINGS Hepatobiliary: No focal liver abnormality is seen. No  gallstones, gallbladder wall thickening, or biliary dilatation. Pancreas: There is atrophy of the pancreas with relative sparing of the distal tail. No pancreatic inflammation, main duct dilatation or mass. Spleen: Normal in size without focal abnormality. Adrenals/Urinary Tract: Normal appearance of the adrenal glands. Kidneys are unremarkable. No mass or hydronephrosis. The bladder is unremarkable. Stomach/Bowel: Stomach is within normal limits. Appendix appears normal. No evidence of bowel wall thickening, distention, or inflammatory changes. Vascular/Lymphatic: Aortic atherosclerosis without aneurysm. No enlarged lymph nodes within the abdomen or pelvis. No inguinal adenopathy. Reproductive: Uterus and bilateral adnexa are unremarkable. Other: No free fluid or fluid collections. Musculoskeletal: No acute or significant osseous findings. IMPRESSION: 1. No acute findings within the chest, abdomen or pelvis. No specific findings identified to suggest residual or recurrence of tumor. 2. Tiny, less than 5 mm, nonspecific pulmonary nodules are unchanged from 09/11/2018. 3. Aortic atherosclerosis and coronary artery calcification. Aortic Atherosclerosis (ICD10-I70.0). Electronically Signed   By: TKerby MoorsM.D.   On: 08/20/2019 12:17    ONCOLOGIC HISTORY: Patient initially diagnosed with metastatic melanoma to right thigh with unknown primary in 2009. She received 4 weeks of high-dose interferon therapy in February 2009 and proceed with low-dose interferon therapy in March 2009. Patient remained in remission until August 2012 when she was noted to have recurrent melanoma and a pelvic lymph node. October 2012 she had 2 cycles of Ipilumimab which was discontinued secondary to poor tolerance. In April 2017 PET scan showed recurrent disease in the right inguinal region which was confirmed by biopsy. Zeboraf discontinued in November 2017 secondary to intolerable rash. Patient initiated combination immunotherapy with  nivolumab and ipilumimab on January 14, 2016. Patient initiated maintenance nivolumab on April 14, 2016 and discontinued on November 07, 2016.  Nivolumab was discontinued secondary to persistent diarrhea.  Single agent oral Mekinist from December 07, 2016 through December 2018.  ASSESSMENT: Recurrent metastatic melanoma, BRAF+, of right lower extremity, metastatic.  PLAN:    1. Recurrent metastatic melanoma, BRAF+, of right lower extremity, metastatic: See oncology treatment history above. She completed palliative XRT to her iliac crest lesion on August 11, 2016.  PET scan results PET scan was denied by insurance.  PET scan results from March 15, 2019 reviewed independently with no obvious evidence of recurrent or progressive disease.  Her most recent imaging with CT scan on August 19, 2019 reviewed independently and report as above also with no evidence of recurrent or progressive disease.  No intervention is needed at this time.  Patient does not require additional treatment.  She expressed understanding that she still is at risk for recurrence.  She discontinued 2 mg trametinib (Mekinist) daily secondary to rash. If this occurs, could possibly reinitiate Mekinist. Can also consider combination therapy using dabrafenib/trametinib in the future.  Return to clinic in 6 months with repeat imaging and laboratory work followed by video assisted telemedicine visit.  2. Pain: Unrelated to malignancy.  Patient has left hip pain possibly secondary to osteoarthritis.  Continue follow-up with primary care as scheduled. 3. Nausea: Resolved.  Continue Compazine as needed. 4. Diarrhea: Resolved with Remicade. At least grade 3 and refractory to steroids. Colonoscopy and EGD were unrevealing.  5. Pancreatitis: Resolved.  Unclear etiology.  Patient no longer has follow-up with GI. 6. Elevated creatinine: Creatinine is improved to 1.48.  Continue follow-up with nephrology as indicated.   Patient expressed  understanding and was in agreement with this plan. She also understands that She can call clinic at any time with any questions, concerns, or complaints.    Lloyd Huger, MD 08/26/19 11:59 AM

## 2019-08-26 ENCOUNTER — Encounter: Payer: Self-pay | Admitting: Oncology

## 2019-08-26 ENCOUNTER — Other Ambulatory Visit: Payer: Self-pay

## 2019-08-26 ENCOUNTER — Inpatient Hospital Stay: Payer: Medicare Other | Admitting: Oncology

## 2019-08-26 VITALS — BP 126/69 | HR 69 | Temp 97.5°F | Resp 20 | Ht 67.0 in | Wt 136.0 lb

## 2019-08-26 DIAGNOSIS — C439 Malignant melanoma of skin, unspecified: Secondary | ICD-10-CM

## 2019-08-26 DIAGNOSIS — C4371 Malignant melanoma of right lower limb, including hip: Secondary | ICD-10-CM | POA: Diagnosis not present

## 2019-08-26 DIAGNOSIS — C799 Secondary malignant neoplasm of unspecified site: Secondary | ICD-10-CM

## 2019-08-26 NOTE — Progress Notes (Signed)
Pain in left hip states keeping her up at night.

## 2019-09-11 ENCOUNTER — Ambulatory Visit
Admission: RE | Admit: 2019-09-11 | Discharge: 2019-09-11 | Disposition: A | Discharge status: Home / Self Care | Payer: Medicare Other | Source: Ambulatory Visit | Attending: Oncology | Admitting: Oncology

## 2019-09-11 DIAGNOSIS — Z1231 Encounter for screening mammogram for malignant neoplasm of breast: Secondary | ICD-10-CM | POA: Diagnosis present

## 2019-09-17 ENCOUNTER — Ambulatory Visit: Payer: Medicare Other

## 2019-09-17 ENCOUNTER — Other Ambulatory Visit: Payer: Medicare Other

## 2019-09-19 ENCOUNTER — Ambulatory Visit: Payer: Medicare Other | Admitting: Oncology

## 2019-09-26 ENCOUNTER — Ambulatory Visit: Payer: Medicare Other | Admitting: Oncology

## 2019-09-26 IMAGING — CT NM PET IMAGE RESTAGE (PS) WHOLE BODY
1 of 8 series · 3 of 25 positions shown · non-contrast
Comparison: 02/01/2017 PET-CT.

CLINICAL DATA: Subsequent treatment strategy for malignant
melanoma.

EXAM:
NUCLEAR MEDICINE PET WHOLE BODY
TECHNIQUE: 7.8 mCi F-18 FDG was injected intravenously. Full-ring PET imaging
was performed from the skull base to thigh after the radiotracer. CT
data was obtained and used for attenuation correction and anatomic
localization.
Fasting blood glucose: 78 mg/dl
Mediastinal blood pool activity: SUV max

[Series 3: ct wb 5.0 b30f · axial · 5.0mm · 0.98mm/px · z∈[-1531,-628]mm · 3 of 603 slices shown]
[im 151/603  soft-tissue]
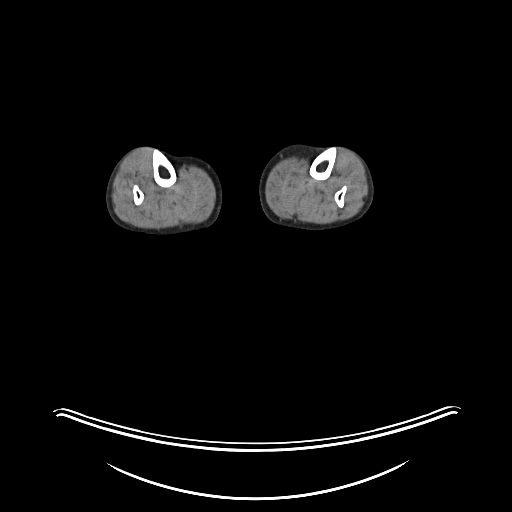
[im 302/603  soft-tissue]
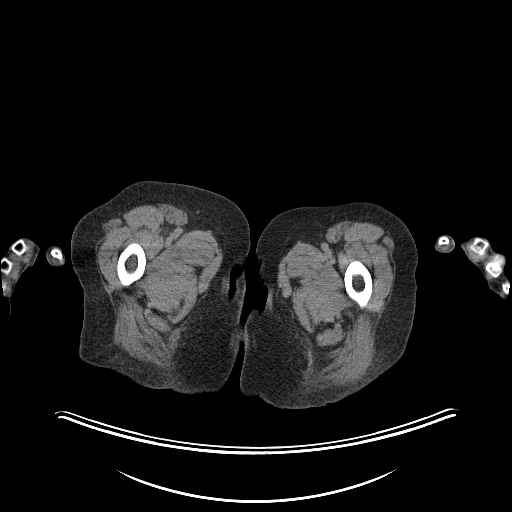
[im 452/603  soft-tissue]
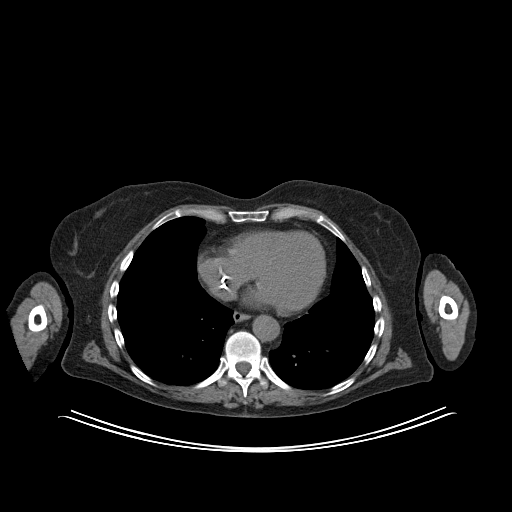

[3 of 25 positions shown; findings below may reference images not displayed]

FINDINGS: HEAD/NECK: No hypermetabolic activity in the scalp. No
hypermetabolic cervical lymph nodes.

Incidental CT findings: none

CHEST: No hypermetabolic axillary, mediastinal or hilar nodes. No
pulmonary hypermetabolism.

Incidental CT findings: No acute consolidative airspace disease,
lung masses or significant pulmonary nodules. No pleural effusions.
Left subclavian MediPort terminates at the cavoatrial junction. Left
anterior descending coronary atherosclerosis. Mildly atherosclerotic
nonaneurysmal thoracic aorta.

ABDOMEN/PELVIS: No abnormal hypermetabolic activity within the
liver, pancreas, adrenal glands, or spleen. No hypermetabolic lymph
nodes in the abdomen or pelvis. Mildly enlarged 1.5 cm right
external iliac lymph node is stable in size and non hypermetabolic
(series 3/image 262).

Incidental CT findings: Mildly atherosclerotic nonaneurysmal
abdominal aorta.

SKELETON: No focal hypermetabolic activity to suggest skeletal
metastasis. No recurrent hypermetabolism at the sclerotic focus in
the anterior left sacrum (series 3/image 248).

Incidental CT findings: none

EXTREMITIES: No abnormal hypermetabolic activity in the lower
extremities.

Incidental CT findings: none
IMPRESSION: 1. No evidence of hypermetabolic metastatic disease.
2. Stable mildly enlarged non hypermetabolic right external iliac
lymph node.
3. No recurrent hypermetabolism at the sclerotic focus in the
anterior left sacrum.
4.  Aortic Atherosclerosis (INPT4-SP3.3).

## 2019-10-20 ENCOUNTER — Other Ambulatory Visit: Payer: Self-pay | Admitting: Oncology

## 2019-11-25 ENCOUNTER — Encounter: Payer: Self-pay | Admitting: Oncology

## 2020-02-03 ENCOUNTER — Other Ambulatory Visit: Payer: Self-pay | Admitting: Neurology

## 2020-02-03 DIAGNOSIS — M4802 Spinal stenosis, cervical region: Secondary | ICD-10-CM

## 2020-02-03 DIAGNOSIS — R4189 Other symptoms and signs involving cognitive functions and awareness: Secondary | ICD-10-CM

## 2020-02-17 ENCOUNTER — Ambulatory Visit: Payer: Medicare Other

## 2020-02-17 ENCOUNTER — Other Ambulatory Visit: Payer: Medicare Other

## 2020-02-24 ENCOUNTER — Ambulatory Visit (HOSPITAL_COMMUNITY): Payer: Medicare Other | Attending: Neurology

## 2020-02-24 ENCOUNTER — Encounter (HOSPITAL_COMMUNITY): Payer: Self-pay

## 2020-02-24 ENCOUNTER — Ambulatory Visit (HOSPITAL_COMMUNITY): Admission: RE | Admit: 2020-02-24 | Payer: Medicare Other | Source: Ambulatory Visit

## 2020-02-25 ENCOUNTER — Inpatient Hospital Stay: Payer: Medicare Other | Attending: Oncology

## 2020-02-25 ENCOUNTER — Other Ambulatory Visit: Payer: Self-pay

## 2020-02-25 ENCOUNTER — Ambulatory Visit
Admission: RE | Admit: 2020-02-25 | Discharge: 2020-02-25 | Disposition: A | Discharge status: Home / Self Care | Payer: Medicare Other | Source: Ambulatory Visit | Attending: Oncology | Admitting: Oncology

## 2020-02-25 DIAGNOSIS — C799 Secondary malignant neoplasm of unspecified site: Secondary | ICD-10-CM | POA: Insufficient documentation

## 2020-02-25 DIAGNOSIS — R911 Solitary pulmonary nodule: Secondary | ICD-10-CM | POA: Insufficient documentation

## 2020-02-25 DIAGNOSIS — R918 Other nonspecific abnormal finding of lung field: Secondary | ICD-10-CM | POA: Diagnosis not present

## 2020-02-25 DIAGNOSIS — X58XXXD Exposure to other specified factors, subsequent encounter: Secondary | ICD-10-CM | POA: Diagnosis not present

## 2020-02-25 DIAGNOSIS — S32039D Unspecified fracture of third lumbar vertebra, subsequent encounter for fracture with routine healing: Secondary | ICD-10-CM | POA: Insufficient documentation

## 2020-02-25 DIAGNOSIS — S2241XD Multiple fractures of ribs, right side, subsequent encounter for fracture with routine healing: Secondary | ICD-10-CM | POA: Diagnosis not present

## 2020-02-25 DIAGNOSIS — K449 Diaphragmatic hernia without obstruction or gangrene: Secondary | ICD-10-CM | POA: Insufficient documentation

## 2020-02-25 DIAGNOSIS — C439 Malignant melanoma of skin, unspecified: Secondary | ICD-10-CM

## 2020-02-25 DIAGNOSIS — I700 Atherosclerosis of aorta: Secondary | ICD-10-CM | POA: Diagnosis not present

## 2020-02-25 LAB — COMPREHENSIVE METABOLIC PANEL
ALT: 16 U/L (ref 0–44)
AST: 29 U/L (ref 15–41)
Albumin: 4.1 g/dL (ref 3.5–5.0)
Alkaline Phosphatase: 67 U/L (ref 38–126)
Anion gap: 7 (ref 5–15)
BUN: 11 mg/dL (ref 6–20)
CO2: 29 mmol/L (ref 22–32)
Calcium: 9.1 mg/dL (ref 8.9–10.3)
Chloride: 97 mmol/L — ABNORMAL LOW (ref 98–111)
Creatinine, Ser: 1.48 mg/dL — ABNORMAL HIGH (ref 0.44–1.00)
GFR, Estimated: 41 mL/min — ABNORMAL LOW (ref >60)
Glucose, Bld: 101 mg/dL — ABNORMAL HIGH (ref 70–99)
Potassium: 4.1 mmol/L (ref 3.5–5.1)
Sodium: 133 mmol/L — ABNORMAL LOW (ref 135–145)
Total Bilirubin: 0.6 mg/dL (ref 0.3–1.2)
Total Protein: 7 g/dL (ref 6.5–8.1)

## 2020-02-25 LAB — CBC WITH DIFFERENTIAL/PLATELET
Abs Immature Granulocytes: 0.04 10*3/uL (ref 0.00–0.07)
Basophils Absolute: 0.1 10*3/uL (ref 0.0–0.1)
Basophils Relative: 1 %
Eosinophils Absolute: 0.2 10*3/uL (ref 0.0–0.5)
Eosinophils Relative: 2 %
HCT: 38.8 % (ref 36.0–46.0)
Hemoglobin: 12.9 g/dL (ref 12.0–15.0)
Immature Granulocytes: 0 %
Lymphocytes Relative: 18 %
Lymphs Abs: 1.8 10*3/uL (ref 0.7–4.0)
MCH: 30.7 pg (ref 26.0–34.0)
MCHC: 33.2 g/dL (ref 30.0–36.0)
MCV: 92.4 fL (ref 80.0–100.0)
Monocytes Absolute: 0.6 10*3/uL (ref 0.1–1.0)
Monocytes Relative: 6 %
Neutro Abs: 7.1 10*3/uL (ref 1.7–7.7)
Neutrophils Relative %: 73 %
Platelets: 236 10*3/uL (ref 150–400)
RBC: 4.2 MIL/uL (ref 3.87–5.11)
RDW: 12.2 % (ref 11.5–15.5)
WBC: 9.8 10*3/uL (ref 4.0–10.5)
nRBC: 0 % (ref 0.0–0.2)

## 2020-02-25 MED ORDER — IOHEXOL 300 MG/ML  SOLN
75.0000 mL | Freq: Once | INTRAMUSCULAR | Status: AC | PRN
  Administered 2020-02-25: 75 mL via INTRAVENOUS

## 2020-02-27 ENCOUNTER — Encounter: Payer: Self-pay | Admitting: Oncology

## 2020-03-03 ENCOUNTER — Inpatient Hospital Stay: Payer: Medicare Other | Attending: Oncology | Admitting: Oncology

## 2020-03-03 ENCOUNTER — Other Ambulatory Visit: Payer: Self-pay

## 2020-03-03 DIAGNOSIS — I509 Heart failure, unspecified: Secondary | ICD-10-CM | POA: Insufficient documentation

## 2020-03-03 DIAGNOSIS — K219 Gastro-esophageal reflux disease without esophagitis: Secondary | ICD-10-CM | POA: Insufficient documentation

## 2020-03-03 DIAGNOSIS — R911 Solitary pulmonary nodule: Secondary | ICD-10-CM

## 2020-03-03 DIAGNOSIS — I130 Hypertensive heart and chronic kidney disease with heart failure and stage 1 through stage 4 chronic kidney disease, or unspecified chronic kidney disease: Secondary | ICD-10-CM | POA: Insufficient documentation

## 2020-03-03 DIAGNOSIS — Z79899 Other long term (current) drug therapy: Secondary | ICD-10-CM | POA: Insufficient documentation

## 2020-03-03 DIAGNOSIS — E2740 Unspecified adrenocortical insufficiency: Secondary | ICD-10-CM | POA: Insufficient documentation

## 2020-03-03 DIAGNOSIS — F17210 Nicotine dependence, cigarettes, uncomplicated: Secondary | ICD-10-CM | POA: Insufficient documentation

## 2020-03-03 DIAGNOSIS — M549 Dorsalgia, unspecified: Secondary | ICD-10-CM | POA: Insufficient documentation

## 2020-03-03 DIAGNOSIS — E538 Deficiency of other specified B group vitamins: Secondary | ICD-10-CM | POA: Insufficient documentation

## 2020-03-03 DIAGNOSIS — Z85820 Personal history of malignant melanoma of skin: Secondary | ICD-10-CM | POA: Insufficient documentation

## 2020-03-03 DIAGNOSIS — Z9221 Personal history of antineoplastic chemotherapy: Secondary | ICD-10-CM | POA: Insufficient documentation

## 2020-03-03 DIAGNOSIS — F319 Bipolar disorder, unspecified: Secondary | ICD-10-CM | POA: Insufficient documentation

## 2020-03-03 DIAGNOSIS — M810 Age-related osteoporosis without current pathological fracture: Secondary | ICD-10-CM | POA: Insufficient documentation

## 2020-03-03 DIAGNOSIS — I429 Cardiomyopathy, unspecified: Secondary | ICD-10-CM | POA: Insufficient documentation

## 2020-03-03 DIAGNOSIS — K449 Diaphragmatic hernia without obstruction or gangrene: Secondary | ICD-10-CM | POA: Insufficient documentation

## 2020-03-03 DIAGNOSIS — E039 Hypothyroidism, unspecified: Secondary | ICD-10-CM | POA: Insufficient documentation

## 2020-03-04 NOTE — Progress Notes (Signed)
[place]  [phone] [phone]) [phone]  ID: Patricia Li OB: 1960/08/16  MR#: [id number]  UYQ#:[id number]  Patient Care Team: Martie Lee, MD as PCP - General (Family Medicine) Bary Castilla, Forest Gleason, MD as Consulting Physician (General Surgery) Forest Gleason, MD (Inactive) (Oncology) Lloyd Huger, MD as Consulting Physician (Oncology)  I connected with Patricia Li on 03/04/20 at  2:00 PM EST by video enabled telemedicine visit and verified that I am speaking with the correct person using two identifiers.   I discussed the limitations, risks, security and privacy concerns of performing an evaluation and management service by telemedicine and the availability of in-person appointments. I also discussed with the patient that there may be a patient responsible charge related to this service. The patient expressed understanding and agreed to proceed.   Other persons participating in the visit and their role in the encounter: Patient, MD.  Patient's location: Home. Provider's location: Clinic.  CHIEF COMPLAINT: Recurrent metastatic melanoma, BRAF+, of right lower extremity, metastatic.  INTERVAL HISTORY: Patient agreed to the assisted telemedicine visit for routine 34-monthevaluation and discussion of her imaging results.  She currently feels well and is asymptomatic.  She does not complain of any pain today.  She denies any weakness or fatigue.  She has no neurologic complaints. She denies any recent fevers or illnesses. She has a good appetite.  She denies any chest pain, shortness of breath, cough, or hemoptysis. She has no nausea, vomiting, diarrhea, or constipation. She denies any joint pain.  She has no urinary complaints.  Patient feels at her baseline offers no specific complaints today.  REVIEW OF SYSTEMS:   Review of Systems  Constitutional: Negative.  Negative for fever, malaise/fatigue and weight loss.  HENT: Negative.  Negative for  ear pain and tinnitus.   Eyes: Negative.   Respiratory: Negative.  Negative for cough and shortness of breath.   Cardiovascular: Negative.  Negative for chest pain and leg swelling.  Gastrointestinal: Negative for abdominal pain, blood in stool, diarrhea, heartburn and melena.  Genitourinary: Negative.  Negative for dysuria.  Musculoskeletal: Negative.  Negative for back pain and joint pain.  Skin: Negative.  Negative for itching and rash.  Neurological: Negative.  Negative for sensory change, focal weakness and weakness.  Endo/Heme/Allergies: Does not bruise/bleed easily.  Psychiatric/Behavioral: Negative.  Negative for depression. The patient is not nervous/anxious and does not have insomnia.     As per HPI. Otherwise, a complete review of systems is negative.  PAST MEDICAL HISTORY: Past Medical History:  Diagnosis Date  . Adrenal insufficiency ([place]   . Bipolar disorder ([facility]   . Cardiomyopathy ([place]   . CHF (congestive heart failure) ([place] 2016   Dr. KNehemiah Massed . Depression   . GERD (gastroesophageal reflux disease) 2012  . Hypertension   . Hypothyroidism   . Joint pain   . Lumbar herniated disc 2010  . Malignant melanoma of skin of lower limb, including hip ([place] 2008, 2012, 2017   right thigh/2017- stage 4  . Osteoporosis, postmenopausal   . Overactive bladder   . Pancreatitis   . Panhypopituitarism ([place]   . Personal history of chemotherapy 2008   melonoma  . Personal history of malignant melanoma of skin 2012  . Personal history of radiation therapy 2017   melonoma  . Rectal bleeding   . Renal insufficiency   . Supraglottitis   . Tendonitis   . Tick bite of head 07/20/2015  . Weight loss, unintentional  PAST SURGICAL HISTORY: Past Surgical History:  Procedure Laterality Date  . BACK SURGERY  2010  . BREAST CYST EXCISION  1985  . COLONOSCOPY  07/2012   [facility]  . COLONOSCOPY WITH PROPOFOL N/A 11/21/2016   Procedure: COLONOSCOPY WITH PROPOFOL;  Surgeon:  Manya Silvas, MD;  Location: [facility] ENDOSCOPY;  Service: Endoscopy;  Laterality: N/A;  . ESOPHAGOGASTRODUODENOSCOPY    . ESOPHAGOGASTRODUODENOSCOPY (EGD) WITH PROPOFOL N/A 11/21/2016   Procedure: ESOPHAGOGASTRODUODENOSCOPY (EGD) WITH PROPOFOL;  Surgeon: Manya Silvas, MD;  Location: [facility] ENDOSCOPY;  Service: Endoscopy;  Laterality: N/A;  . FRACTURE SURGERY     rod removed from left femur 07/21/2014  . INGUINAL LYMPH NODE BIOPSY Right 07/17/2015   Procedure: INGUINAL LYMPH NODE BIOPSY/EXCISION;  Surgeon: Robert Bellow, MD;  Location: [facility] ORS;  Service: General;  Laterality: Right;  . laminnotomy reexploration posterior lumbar   12/26/2008   with nerve decomp and discectomy  . MELANOMA EXCISION  2008   right thigh  . PORTACATH PLACEMENT Left 03/14/2016   Procedure: INSERTION PORT-A-CATH;  Surgeon: Robert Bellow, MD;  Location: [facility] ORS;  Service: General;  Laterality: Left;  . SKIN LESION EXCISION Right 12-19-10   posterior  . SPINE SURGERY      FAMILY HISTORY: Family History  Problem Relation Age of Onset  . COPD Father   . Cancer Sister        Colon, sister  . Breast cancer Neg Hx        ADVANCED DIRECTIVES:    HEALTH MAINTENANCE: Social History   Tobacco Use  . Smoking status: Current Every Day Smoker    Packs/day: 0.50    Years: 20.00    Pack years: 10.00    Types: Cigarettes  . Smokeless tobacco: Never Used  Vaping Use  . Vaping Use: Never used  Substance Use Topics  . Alcohol use: Yes    Alcohol/week: 0.0 standard drinks    Comment: ocassionally  . Drug use: No     Colonoscopy:  PAP:  Bone density:  Lipid panel:  Allergies  Allergen Reactions  . Clindamycin Shortness Of Breath  . Levaquin [Levofloxacin] Other (See Comments)    Joint inflammation  . Aripiprazole Other (See Comments)    Facial and head ticks  . Depakote [Divalproex Sodium] Other (See Comments)  . Valproic Acid     Other reaction(s): Other (See Comments) Facial twitching   . Ondansetron Hcl Nausea And Vomiting  . Promethazine Nausea And Vomiting    Current Outpatient Medications  Medication Sig Dispense Refill  . calcitRIOL (ROCALTROL) 0.25 MCG capsule Take 0.25 mcg by mouth daily.  6  . cetirizine (ZYRTEC ALLERGY) 10 MG tablet Take 10 mg by mouth daily.    . clonazePAM (KLONOPIN) 0.5 MG tablet Take 0.5 mg by mouth at bedtime.    . hydrocortisone (CORTEF) 10 MG tablet Take 10-15 mg by mouth 2 (two) times daily. 1.5 tablets (15 MG) in the morning and 1 tablet (10 MG) in the evening    . levothyroxine (SYNTHROID, LEVOTHROID) 137 MCG tablet Take 150 mcg by mouth daily.     . Metoclopramide HCl (REGLAN PO) Take by mouth.    . Multiple Vitamin (MULTI-VITAMIN) tablet Take 1 tablet by mouth 1 day or 1 dose.    . pantoprazole (PROTONIX) 40 MG tablet TAKE 1 TABLET BY MOUTH TWICE A DAY 180 tablet 1  . pregabalin (LYRICA) 50 MG capsule Take 25 mg by mouth 3 (three) times daily.    . QUEtiapine (  SEROQUEL) 25 MG tablet Take 1 tablet by mouth at bedtime.    . rosuvastatin (CRESTOR) 5 MG tablet Take 5 mg by mouth daily.    Marland Kitchen venlafaxine XR (EFFEXOR-XR) 150 MG 24 hr capsule Take 1 capsule by mouth 2 (two) times daily.     No current facility-administered medications for this visit.    OBJECTIVE: There were no vitals filed for this visit.   There is no height or weight on file to calculate BMI.    ECOG FS:0 - Asymptomatic  General: Well-developed, well-nourished, no acute distress. HEENT: Normocephalic. Neuro: Alert, answering all questions appropriately. Cranial nerves grossly intact. Psych: Normal affect.   LAB RESULTS:  Lab Results  Component Value Date   NA 133 (L) 02/25/2020   K 4.1 02/25/2020   CL 97 (L) 02/25/2020   CO2 29 02/25/2020   GLUCOSE 101 (H) 02/25/2020   BUN 11 02/25/2020   CREATININE 1.48 (H) 02/25/2020   CALCIUM 9.1 02/25/2020   PROT 7.0 02/25/2020   ALBUMIN 4.1 02/25/2020   AST 29 02/25/2020   ALT 16 02/25/2020   ALKPHOS 67  02/25/2020   BILITOT 0.6 02/25/2020   GFRNONAA 41 (L) 02/25/2020   GFRAA 45 (L) 08/19/2019    Lab Results  Component Value Date   WBC 9.8 02/25/2020   NEUTROABS 7.1 02/25/2020   HGB 12.9 02/25/2020   HCT 38.8 02/25/2020   MCV 92.4 02/25/2020   PLT 236 02/25/2020    STUDIES: CT Chest W Contrast  Result Date: 02/25/2020 CLINICAL DATA:  Recurrent metastatic melanoma, initially diagnosed in right thigh in 2009. History of palliative radiation therapy to iliac crest lesion completed 08/11/2016. Restaging. Current smoker. EXAM: CT CHEST, ABDOMEN, AND PELVIS WITH CONTRAST TECHNIQUE: Multidetector CT imaging of the chest, abdomen and pelvis was performed following the standard protocol during bolus administration of intravenous contrast. CONTRAST:  25m OMNIPAQUE IOHEXOL 300 MG/ML  SOLN COMPARISON:  08/19/2019 CT chest, abdomen and pelvis. 03/15/2019 PET-CT. FINDINGS: CT CHEST FINDINGS Cardiovascular: Normal heart size. No significant pericardial effusion/thickening. Left anterior descending coronary atherosclerosis. Atherosclerotic nonaneurysmal thoracic aorta. Normal caliber pulmonary arteries. No central pulmonary emboli. Mediastinum/Nodes: No discrete thyroid nodules. Unremarkable esophagus. No pathologically enlarged axillary, mediastinal or hilar lymph nodes. Lungs/Pleura: No pneumothorax. No pleural effusion. New indistinct clustered subsolid nodules in the posterior left lower lobe, largest 0.6 cm (series 3/image 76). Previously visualized 2 mm anterior left upper lobe (series 3/image 55) and peripheral left upper lobe (series 3/image 36) pulmonary nodules are stable and presumably benign. No acute consolidative airspace disease, lung masses or additional significant pulmonary nodules. Musculoskeletal: No aggressive appearing focal osseous lesions. Moderate upper thoracic spine degenerative disc disease, unchanged. Healing anterior right seventh, eighth and ninth rib fractures. CT ABDOMEN PELVIS  FINDINGS Hepatobiliary: Normal liver with no liver mass. Normal gallbladder with no radiopaque cholelithiasis. No biliary ductal dilatation. Pancreas: Chronic diffuse pancreatic parenchymal atrophy with no pancreatic mass or duct dilation. Spleen: Normal size. No mass. Adrenals/Urinary Tract: Normal adrenals. Normal kidneys with no hydronephrosis and no renal mass. Normal nondistended bladder. Stomach/Bowel: Small hiatal hernia. Otherwise normal nondistended stomach. Normal caliber small bowel with no small bowel wall thickening. Normal appendix. Normal large bowel with no diverticulosis, large bowel wall thickening or pericolonic fat stranding. Vascular/Lymphatic: Atherosclerotic nonaneurysmal abdominal aorta. Patent portal, splenic, hepatic and renal veins. No pathologically enlarged lymph nodes in the abdomen or pelvis. Reproductive: Grossly normal uterus.  No adnexal mass. Other: No pneumoperitoneum, ascites or focal fluid collection. Musculoskeletal: No aggressive appearing focal  osseous lesions. Stable subcentimeter sclerotic L4 vertebral lesion, favor a bone island. Moderate lumbar spondylosis. Nondisplaced healing right L3 transverse process fracture. IMPRESSION: 1. No findings highly suspicious for metastatic disease in the chest, abdomen or pelvis. 2. New indistinct clustered subsolid subcentimeter pulmonary nodules in the posterior left lower lobe, indeterminate, favor infectious/inflammatory etiology. Suggest attention on follow-up chest CT in 3 months. 3. Healing anterior right seventh, eighth and ninth rib fractures. Nondisplaced healing right L3 transverse process fracture. Correlate with interval trauma history. 4. Aortic Atherosclerosis (ICD10-I70.0). Electronically Signed   By: Ilona Sorrel M.D.   On: 02/25/2020 13:15   CT Abdomen Pelvis W Contrast  Result Date: 02/25/2020 CLINICAL DATA:  Recurrent metastatic melanoma, initially diagnosed in right thigh in 2009. History of palliative radiation  therapy to iliac crest lesion completed 08/11/2016. Restaging. Current smoker. EXAM: CT CHEST, ABDOMEN, AND PELVIS WITH CONTRAST TECHNIQUE: Multidetector CT imaging of the chest, abdomen and pelvis was performed following the standard protocol during bolus administration of intravenous contrast. CONTRAST:  41m OMNIPAQUE IOHEXOL 300 MG/ML  SOLN COMPARISON:  08/19/2019 CT chest, abdomen and pelvis. 03/15/2019 PET-CT. FINDINGS: CT CHEST FINDINGS Cardiovascular: Normal heart size. No significant pericardial effusion/thickening. Left anterior descending coronary atherosclerosis. Atherosclerotic nonaneurysmal thoracic aorta. Normal caliber pulmonary arteries. No central pulmonary emboli. Mediastinum/Nodes: No discrete thyroid nodules. Unremarkable esophagus. No pathologically enlarged axillary, mediastinal or hilar lymph nodes. Lungs/Pleura: No pneumothorax. No pleural effusion. New indistinct clustered subsolid nodules in the posterior left lower lobe, largest 0.6 cm (series 3/image 76). Previously visualized 2 mm anterior left upper lobe (series 3/image 55) and peripheral left upper lobe (series 3/image 36) pulmonary nodules are stable and presumably benign. No acute consolidative airspace disease, lung masses or additional significant pulmonary nodules. Musculoskeletal: No aggressive appearing focal osseous lesions. Moderate upper thoracic spine degenerative disc disease, unchanged. Healing anterior right seventh, eighth and ninth rib fractures. CT ABDOMEN PELVIS FINDINGS Hepatobiliary: Normal liver with no liver mass. Normal gallbladder with no radiopaque cholelithiasis. No biliary ductal dilatation. Pancreas: Chronic diffuse pancreatic parenchymal atrophy with no pancreatic mass or duct dilation. Spleen: Normal size. No mass. Adrenals/Urinary Tract: Normal adrenals. Normal kidneys with no hydronephrosis and no renal mass. Normal nondistended bladder. Stomach/Bowel: Small hiatal hernia. Otherwise normal nondistended  stomach. Normal caliber small bowel with no small bowel wall thickening. Normal appendix. Normal large bowel with no diverticulosis, large bowel wall thickening or pericolonic fat stranding. Vascular/Lymphatic: Atherosclerotic nonaneurysmal abdominal aorta. Patent portal, splenic, hepatic and renal veins. No pathologically enlarged lymph nodes in the abdomen or pelvis. Reproductive: Grossly normal uterus.  No adnexal mass. Other: No pneumoperitoneum, ascites or focal fluid collection. Musculoskeletal: No aggressive appearing focal osseous lesions. Stable subcentimeter sclerotic L4 vertebral lesion, favor a bone island. Moderate lumbar spondylosis. Nondisplaced healing right L3 transverse process fracture. IMPRESSION: 1. No findings highly suspicious for metastatic disease in the chest, abdomen or pelvis. 2. New indistinct clustered subsolid subcentimeter pulmonary nodules in the posterior left lower lobe, indeterminate, favor infectious/inflammatory etiology. Suggest attention on follow-up chest CT in 3 months. 3. Healing anterior right seventh, eighth and ninth rib fractures. Nondisplaced healing right L3 transverse process fracture. Correlate with interval trauma history. 4. Aortic Atherosclerosis (ICD10-I70.0). Electronically Signed   By: JIlona SorrelM.D.   On: 02/25/2020 13:15    ONCOLOGIC HISTORY: Patient initially diagnosed with metastatic melanoma to right thigh with unknown primary in 2009. She received 4 weeks of high-dose interferon therapy in February 2009 and proceed with low-dose interferon therapy in March 2009. Patient remained  in remission until August 2012 when she was noted to have recurrent melanoma and a pelvic lymph node. October 2012 she had 2 cycles of Ipilumimab which was discontinued secondary to poor tolerance. In April 2017 PET scan showed recurrent disease in the right inguinal region which was confirmed by biopsy. Zeboraf discontinued in November 2017 secondary to intolerable rash.  Patient initiated combination immunotherapy with nivolumab and ipilumimab on January 14, 2016. Patient initiated maintenance nivolumab on April 14, 2016 and discontinued on November 07, 2016.  Nivolumab was discontinued secondary to persistent diarrhea.  Single agent oral Mekinist from December 07, 2016 through December 2018.  ASSESSMENT: Recurrent metastatic melanoma, BRAF+, of right lower extremity, metastatic.  PLAN:    1. Recurrent metastatic melanoma, BRAF+, of right lower extremity, metastatic: See oncology treatment history above. She completed palliative XRT to her iliac crest lesion on August 11, 2016. PET scan results from March 15, 2019 reviewed independently with no obvious evidence of recurrent or progressive disease.  Her most recent imaging with CT scan on February 25, 2020 reviewed independently and report as above with no obvious evidence of recurrent or progressive disease.  No intervention is needed at this time.  Patient does not require additional treatment.  She expressed understanding that she still is at risk for recurrence.  She discontinued 2 mg trametinib (Mekinist) daily secondary to rash. If this occurs, could possibly reinitiate Mekinist. Can also consider combination therapy using dabrafenib/trametinib in the future.  Patient has requested a change of providers therefore will return to clinic in 3 months with a visit with Dr. Rogue Bussing.  2. Pain: Patient does not complain of this today. 3. Nausea: Resolved.  Continue Compazine as needed. 4. Diarrhea: Resolved with Remicade. At least grade 3 and refractory to steroids. Colonoscopy and EGD were unrevealing.  5. Pancreatitis: Resolved.  Unclear etiology.  Patient no longer has follow-up with GI. 6. Elevated creatinine: Chronic and unchanged.  Patient's creatinine is 1.44 today. 7.  Pulmonary nodules: Likely inflammatory or infectious, but will repeat chest CT in 3 months as above.  Repeat chest, abdomen, and pelvis CT in  6 months.  I provided 20 minutes of face-to-face video visit time during this encounter which included chart review, counseling, and coordination of care as documented above.   Patient expressed understanding and was in agreement with this plan. She also understands that She can call clinic at any time with any questions, concerns, or complaints.    Lloyd Huger, MD 03/04/20 6:47 AM

## 2020-03-13 ENCOUNTER — Encounter: Payer: Self-pay | Admitting: Internal Medicine

## 2020-03-17 ENCOUNTER — Encounter: Payer: Self-pay | Admitting: Internal Medicine

## 2020-03-19 ENCOUNTER — Encounter: Payer: Self-pay | Admitting: Internal Medicine

## 2020-03-19 ENCOUNTER — Other Ambulatory Visit: Payer: Self-pay

## 2020-03-19 ENCOUNTER — Inpatient Hospital Stay: Payer: Medicare Other | Admitting: Internal Medicine

## 2020-03-19 VITALS — BP 132/60 | HR 83 | Temp 97.5°F | Resp 16 | Wt 143.8 lb

## 2020-03-19 DIAGNOSIS — G8929 Other chronic pain: Secondary | ICD-10-CM | POA: Diagnosis not present

## 2020-03-19 DIAGNOSIS — E538 Deficiency of other specified B group vitamins: Secondary | ICD-10-CM | POA: Diagnosis not present

## 2020-03-19 DIAGNOSIS — C4371 Malignant melanoma of right lower limb, including hip: Secondary | ICD-10-CM | POA: Diagnosis not present

## 2020-03-19 DIAGNOSIS — M545 Low back pain, unspecified: Secondary | ICD-10-CM

## 2020-03-19 DIAGNOSIS — Z85820 Personal history of malignant melanoma of skin: Secondary | ICD-10-CM | POA: Diagnosis not present

## 2020-03-19 DIAGNOSIS — I429 Cardiomyopathy, unspecified: Secondary | ICD-10-CM | POA: Diagnosis not present

## 2020-03-19 DIAGNOSIS — M810 Age-related osteoporosis without current pathological fracture: Secondary | ICD-10-CM | POA: Diagnosis not present

## 2020-03-19 DIAGNOSIS — E039 Hypothyroidism, unspecified: Secondary | ICD-10-CM | POA: Diagnosis not present

## 2020-03-19 DIAGNOSIS — M549 Dorsalgia, unspecified: Secondary | ICD-10-CM | POA: Diagnosis not present

## 2020-03-19 DIAGNOSIS — Z79899 Other long term (current) drug therapy: Secondary | ICD-10-CM | POA: Diagnosis not present

## 2020-03-19 DIAGNOSIS — F319 Bipolar disorder, unspecified: Secondary | ICD-10-CM | POA: Diagnosis not present

## 2020-03-19 DIAGNOSIS — I130 Hypertensive heart and chronic kidney disease with heart failure and stage 1 through stage 4 chronic kidney disease, or unspecified chronic kidney disease: Secondary | ICD-10-CM | POA: Diagnosis not present

## 2020-03-19 DIAGNOSIS — Z9221 Personal history of antineoplastic chemotherapy: Secondary | ICD-10-CM | POA: Diagnosis not present

## 2020-03-19 DIAGNOSIS — I509 Heart failure, unspecified: Secondary | ICD-10-CM | POA: Diagnosis not present

## 2020-03-19 DIAGNOSIS — K219 Gastro-esophageal reflux disease without esophagitis: Secondary | ICD-10-CM | POA: Diagnosis not present

## 2020-03-19 DIAGNOSIS — F17210 Nicotine dependence, cigarettes, uncomplicated: Secondary | ICD-10-CM | POA: Diagnosis not present

## 2020-03-19 DIAGNOSIS — E2740 Unspecified adrenocortical insufficiency: Secondary | ICD-10-CM | POA: Diagnosis not present

## 2020-03-19 DIAGNOSIS — K449 Diaphragmatic hernia without obstruction or gangrene: Secondary | ICD-10-CM | POA: Diagnosis not present

## 2020-03-19 NOTE — Assessment & Plan Note (Addendum)
#  Stage IV melanoma-currently NED; DEC 28th CT scan-no evidence of any metastatic disease; subcentimeter subpleural nodules left lung posterior lobe [see below]; healing right-sided rib fractures [see below]  # 1x1 cm left paraspinous cystic lesion at L4/intermittent back pain-given history of melanoma-recommend MRI for further evaluation.  Ordered ASAP.  #December 2021 CT scan new indistinct clustered subsolid subcentimeter pulmonary nodules in the posterior left lower lob-less likely malignant most likely infectious/inflammatory. Recommend imaging 3 months/end of March 2022.  #Status post mechanical fall- healing anterior right seventh, eighth and ninth rib fractures. Nondisplaced healing right L3 transverse process fracture.  Monitor closely.  #CKD stage III-stable clinically.  # DISPOSITION: # MRI lumbar spine ASAP # follow up TBD-Dr.B

## 2020-03-19 NOTE — Progress Notes (Signed)
[place] NOTE  Patient Care Team: Martie Lee, MD as PCP - General (Family Medicine) Bary Castilla, Forest Gleason, MD as Consulting Physician (General Surgery) Forest Gleason, MD (Inactive) (Oncology) Lloyd Huger, MD as Consulting Physician (Oncology)  CHIEF COMPLAINTS/PURPOSE OF CONSULTATION: Metastatic melanoma  #  Oncology History Overview Note  1. 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 TX, Nx, M1 tumor, stage IV. 2. 4 weeks of high in the Intron therapy high dose in February of 2009. 3. Low-dose interferon started from March of 2009  AUG 2012- Right thigh mass [Bx] Recurrent melanoma in the right thigh/ pelvic LN [PETscan]; NEG for BRAF V600E; April 2017 PET scan showed recurrent disease in the right inguinal region which was confirmed by biopsy. Zeboraf discontinued in November 2017 secondary to intolerable rash. Patient initiated combination immunotherapy with nivolumab and ipilumimab on January 14, 2016. Patient initiated maintenance nivolumab on April 14, 2016 and discontinued on November 07, 2016.  Nivolumab was discontinued secondary to persistent diarrhea.  Single agent oral Mekinist from December 07, 2016 through December 2018 [Dr.Finnegan]  # OCT 2012- IPI x 2 cycles [stopped sec to intol- Side effects]  # April 2017- PET scan shows recurrent disease with- RIGHT INGUINAL adenopathy s/p Bx [Dr.Byrnett].June 2017 [OSH]- right external Iliac LN-  RT  # Vitamin B 12 deficiency      Malignant melanoma of skin of lower limb, including hip (HCC)   Initial Diagnosis   Malignant melanoma of skin of lower limb, including hip (HCC)      HISTORY OF PRESENTING ILLNESS:  Patricia Li 60 y.o.  female history of metastatic melanoma currently NED-is here for follow-up.  Patient has been followed up by Dr. Grayland Ormond; however requested switching providers.  Patient's extensive history reviewed/summarized above.  Patient noted to have a  lump in the lower back on the left side approximately 1 x 1 cm.  Noted about 2 weeks ago.  Its not tender; however is painful when she moves around/bending.  Intermittent back pain.  Patient was evaluated by her orthopedic doctor in [facility] 2 days ago.  X-rays done.   Otherwise appetite is good.  No weight loss; no headaches.  Review of Systems  Constitutional: Negative for chills, diaphoresis, fever, malaise/fatigue and weight loss.  HENT: Negative for nosebleeds and sore throat.   Eyes: Negative for double vision.  Respiratory: Negative for cough, hemoptysis, sputum production, shortness of breath and wheezing.   Cardiovascular: Negative for chest pain, palpitations, orthopnea and leg swelling.  Gastrointestinal: Negative for abdominal pain, blood in stool, constipation, diarrhea, heartburn, melena, nausea and vomiting.  Genitourinary: Negative for dysuria, frequency and urgency.  Musculoskeletal: Positive for back pain and joint pain.  Skin: Negative.  Negative for itching and rash.  Neurological: Negative for dizziness, tingling, focal weakness, weakness and headaches.  Endo/Heme/Allergies: Does not bruise/bleed easily.  Psychiatric/Behavioral: Negative for depression. The patient is not nervous/anxious and does not have insomnia.      MEDICAL HISTORY:  Past Medical History:  Diagnosis Date  . Adrenal insufficiency ([place])   . Bipolar disorder ([place])   . Cardiomyopathy ([place])   . CHF (congestive heart failure) ([place]) 2016   Dr. Nehemiah Massed  . Depression   . GERD (gastroesophageal reflux disease) 2012  . Hypertension   . Hypothyroidism   . Joint pain   . Lumbar herniated disc 2010  . Malignant melanoma of skin of lower limb, including hip ([place]) 2008, 2012, 2017   right thigh/2017- stage  4  . Osteoporosis, postmenopausal   . Overactive bladder   . Pancreatitis   . Panhypopituitarism ([place])   . Personal history of chemotherapy 2008   melonoma  . Personal history of  malignant melanoma of skin 2012  . Personal history of radiation therapy 2017   melonoma  . Rectal bleeding   . Renal insufficiency   . Supraglottitis   . Tendonitis   . Tick bite of head 07/20/2015  . Weight loss, unintentional     SURGICAL HISTORY: Past Surgical History:  Procedure Laterality Date  . BACK SURGERY  2010  . BREAST CYST EXCISION  1985  . COLONOSCOPY  07/2012   [facility]  . COLONOSCOPY WITH PROPOFOL N/A 11/21/2016   Procedure: COLONOSCOPY WITH PROPOFOL;  Surgeon: Manya Silvas, MD;  Location: [facility] ENDOSCOPY;  Service: Endoscopy;  Laterality: N/A;  . ESOPHAGOGASTRODUODENOSCOPY    . ESOPHAGOGASTRODUODENOSCOPY (EGD) WITH PROPOFOL N/A 11/21/2016   Procedure: ESOPHAGOGASTRODUODENOSCOPY (EGD) WITH PROPOFOL;  Surgeon: Manya Silvas, MD;  Location: [facility] ENDOSCOPY;  Service: Endoscopy;  Laterality: N/A;  . FRACTURE SURGERY     rod removed from left femur 07/21/2014  . INGUINAL LYMPH NODE BIOPSY Right 07/17/2015   Procedure: INGUINAL LYMPH NODE BIOPSY/EXCISION;  Surgeon: Robert Bellow, MD;  Location: [facility] ORS;  Service: General;  Laterality: Right;  . laminnotomy reexploration posterior lumbar   12/26/2008   with nerve decomp and discectomy  . MELANOMA EXCISION  2008   right thigh  . PORTACATH PLACEMENT Left 03/14/2016   Procedure: INSERTION PORT-A-CATH;  Surgeon: Robert Bellow, MD;  Location: [facility] ORS;  Service: General;  Laterality: Left;  . SKIN LESION EXCISION Right 12-19-10   posterior  . SPINE SURGERY      SOCIAL HISTORY: Social History   Socioeconomic History  . Marital status: Divorced    Spouse name: Not on file  . Number of children: Not on file  . Years of education: Not on file  . Highest education level: Not on file  Occupational History  . Not on file  Tobacco Use  . Smoking status: Current Every Day Smoker    Packs/day: 0.50    Years: 20.00    Pack years: 10.00    Types: Cigarettes  . Smokeless tobacco: Never Used  Vaping Use  . Vaping  Use: Never used  Substance and Sexual Activity  . Alcohol use: Yes    Alcohol/week: 0.0 standard drinks    Comment: ocassionally  . Drug use: No  . Sexual activity: Not on file  Other Topics Concern  . Not on file  Social History Narrative  . Not on file   Social Determinants of Health   Financial Resource Strain: Not on file  Food Insecurity: Not on file  Transportation Needs: Not on file  Physical Activity: Not on file  Stress: Not on file  Social Connections: Not on file  Intimate Partner Violence: Not on file    FAMILY HISTORY: Family History  Problem Relation Age of Onset  . COPD Father   . Cancer Sister        Colon, sister  . Breast cancer Neg Hx     ALLERGIES:  is allergic to clindamycin, levaquin [levofloxacin], aripiprazole, depakote [divalproex sodium], valproic acid, ondansetron hcl, and promethazine.  MEDICATIONS:  Current Outpatient Medications  Medication Sig Dispense Refill  . calcitRIOL (ROCALTROL) 0.25 MCG capsule Take 0.25 mcg by mouth daily.  6  . clonazePAM (KLONOPIN) 0.5 MG tablet Take 0.5 mg by mouth at bedtime.    Marland Kitchen  hydrocortisone (CORTEF) 10 MG tablet Take 10-15 mg by mouth 2 (two) times daily. 1.5 tablets (15 MG) in the morning and 1 tablet (10 MG) in the evening    . levothyroxine (SYNTHROID, LEVOTHROID) 137 MCG tablet Take 150 mcg by mouth daily.     . Metoclopramide HCl (REGLAN PO) Take by mouth.    . Multiple Vitamin (MULTI-VITAMIN) tablet Take 1 tablet by mouth 1 day or 1 dose.    . pantoprazole (PROTONIX) 40 MG tablet TAKE 1 TABLET BY MOUTH TWICE A DAY 180 tablet 1  . pregabalin (LYRICA) 50 MG capsule Take 25 mg by mouth 3 (three) times daily.    . QUEtiapine (SEROQUEL) 25 MG tablet Take 1 tablet by mouth at bedtime.    Marland Kitchen venlafaxine XR (EFFEXOR-XR) 150 MG 24 hr capsule Take 1 capsule by mouth 2 (two) times daily.     No current facility-administered medications for this visit.      Marland Kitchen  PHYSICAL EXAMINATION: ECOG PERFORMANCE  STATUS: 0 - Asymptomatic  Vitals:   03/19/20 0836  BP: 132/60  Pulse: 83  Resp: 16  Temp: (!) 97.5 F (36.4 C)  SpO2: 100%   Filed Weights   03/19/20 0836  Weight: 143 lb 12.8 oz (65.2 kg)    Physical Exam Constitutional:      Comments: Alone.  Ambulating independently.  HENT:     Head: Normocephalic and atraumatic.     Mouth/Throat:     Mouth: Oropharynx is clear and moist.     Pharynx: No oropharyngeal exudate.  Eyes:     Pupils: Pupils are equal, round, and reactive to light.  Cardiovascular:     Rate and Rhythm: Normal rate and regular rhythm.  Pulmonary:     Effort: Pulmonary effort is normal. No respiratory distress.     Breath sounds: Normal breath sounds. No wheezing.  Abdominal:     General: Bowel sounds are normal. There is no distension.     Palpations: Abdomen is soft. There is no mass.     Tenderness: There is no abdominal tenderness. There is no guarding or rebound.  Musculoskeletal:        General: No tenderness or edema. Normal range of motion.     Cervical back: Normal range of motion and neck supple.     Comments: At L4 level -1 x 1 cm left paraspinal/cystic lesion-nontender.  Skin:    General: Skin is warm.  Neurological:     Mental Status: She is alert and oriented to person, place, and time.  Psychiatric:        Mood and Affect: Affect normal.    LABORATORY DATA:  I have reviewed the data as listed Lab Results  Component Value Date   WBC 9.8 02/25/2020   HGB 12.9 02/25/2020   HCT 38.8 02/25/2020   MCV 92.4 02/25/2020   PLT 236 02/25/2020   Recent Labs    08/19/19 1250 02/25/20 0850  NA 135 133*  K 4.3 4.1  CL 100 97*  CO2 27 29  GLUCOSE 80 101*  BUN 12 11  CREATININE 1.48* 1.48*  CALCIUM 8.6* 9.1  GFRNONAA 39* 41*  GFRAA 45*  --   PROT 6.4* 7.0  ALBUMIN 3.9 4.1  AST 28 29  ALT 16 16  ALKPHOS 55 67  BILITOT 0.6 0.6    RADIOGRAPHIC STUDIES: I have personally reviewed the radiological images as listed and agreed with  the findings in the report. CT Chest W Contrast  Result Date:  02/25/2020 CLINICAL DATA:  Recurrent metastatic melanoma, initially diagnosed in right thigh in 2009. History of palliative radiation therapy to iliac crest lesion completed 08/11/2016. Restaging. Current smoker. EXAM: CT CHEST, ABDOMEN, AND PELVIS WITH CONTRAST TECHNIQUE: Multidetector CT imaging of the chest, abdomen and pelvis was performed following the standard protocol during bolus administration of intravenous contrast. CONTRAST:  63m OMNIPAQUE IOHEXOL 300 MG/ML  SOLN COMPARISON:  08/19/2019 CT chest, abdomen and pelvis. 03/15/2019 PET-CT. FINDINGS: CT CHEST FINDINGS Cardiovascular: Normal heart size. No significant pericardial effusion/thickening. Left anterior descending coronary atherosclerosis. Atherosclerotic nonaneurysmal thoracic aorta. Normal caliber pulmonary arteries. No central pulmonary emboli. Mediastinum/Nodes: No discrete thyroid nodules. Unremarkable esophagus. No pathologically enlarged axillary, mediastinal or hilar lymph nodes. Lungs/Pleura: No pneumothorax. No pleural effusion. New indistinct clustered subsolid nodules in the posterior left lower lobe, largest 0.6 cm (series 3/image 76). Previously visualized 2 mm anterior left upper lobe (series 3/image 55) and peripheral left upper lobe (series 3/image 36) pulmonary nodules are stable and presumably benign. No acute consolidative airspace disease, lung masses or additional significant pulmonary nodules. Musculoskeletal: No aggressive appearing focal osseous lesions. Moderate upper thoracic spine degenerative disc disease, unchanged. Healing anterior right seventh, eighth and ninth rib fractures. CT ABDOMEN PELVIS FINDINGS Hepatobiliary: Normal liver with no liver mass. Normal gallbladder with no radiopaque cholelithiasis. No biliary ductal dilatation. Pancreas: Chronic diffuse pancreatic parenchymal atrophy with no pancreatic mass or duct dilation. Spleen: Normal size. No  mass. Adrenals/Urinary Tract: Normal adrenals. Normal kidneys with no hydronephrosis and no renal mass. Normal nondistended bladder. Stomach/Bowel: Small hiatal hernia. Otherwise normal nondistended stomach. Normal caliber small bowel with no small bowel wall thickening. Normal appendix. Normal large bowel with no diverticulosis, large bowel wall thickening or pericolonic fat stranding. Vascular/Lymphatic: Atherosclerotic nonaneurysmal abdominal aorta. Patent portal, splenic, hepatic and renal veins. No pathologically enlarged lymph nodes in the abdomen or pelvis. Reproductive: Grossly normal uterus.  No adnexal mass. Other: No pneumoperitoneum, ascites or focal fluid collection. Musculoskeletal: No aggressive appearing focal osseous lesions. Stable subcentimeter sclerotic L4 vertebral lesion, favor a bone island. Moderate lumbar spondylosis. Nondisplaced healing right L3 transverse process fracture. IMPRESSION: 1. No findings highly suspicious for metastatic disease in the chest, abdomen or pelvis. 2. New indistinct clustered subsolid subcentimeter pulmonary nodules in the posterior left lower lobe, indeterminate, favor infectious/inflammatory etiology. Suggest attention on follow-up chest CT in 3 months. 3. Healing anterior right seventh, eighth and ninth rib fractures. Nondisplaced healing right L3 transverse process fracture. Correlate with interval trauma history. 4. Aortic Atherosclerosis (ICD10-I70.0). Electronically Signed   By: JIlona SorrelM.D.   On: 02/25/2020 13:15   CT Abdomen Pelvis W Contrast  Result Date: 02/25/2020 CLINICAL DATA:  Recurrent metastatic melanoma, initially diagnosed in right thigh in 2009. History of palliative radiation therapy to iliac crest lesion completed 08/11/2016. Restaging. Current smoker. EXAM: CT CHEST, ABDOMEN, AND PELVIS WITH CONTRAST TECHNIQUE: Multidetector CT imaging of the chest, abdomen and pelvis was performed following the standard protocol during bolus  administration of intravenous contrast. CONTRAST:  715mOMNIPAQUE IOHEXOL 300 MG/ML  SOLN COMPARISON:  08/19/2019 CT chest, abdomen and pelvis. 03/15/2019 PET-CT. FINDINGS: CT CHEST FINDINGS Cardiovascular: Normal heart size. No significant pericardial effusion/thickening. Left anterior descending coronary atherosclerosis. Atherosclerotic nonaneurysmal thoracic aorta. Normal caliber pulmonary arteries. No central pulmonary emboli. Mediastinum/Nodes: No discrete thyroid nodules. Unremarkable esophagus. No pathologically enlarged axillary, mediastinal or hilar lymph nodes. Lungs/Pleura: No pneumothorax. No pleural effusion. New indistinct clustered subsolid nodules in the posterior left lower lobe, largest 0.6 cm (series 3/image 76).  Previously visualized 2 mm anterior left upper lobe (series 3/image 55) and peripheral left upper lobe (series 3/image 36) pulmonary nodules are stable and presumably benign. No acute consolidative airspace disease, lung masses or additional significant pulmonary nodules. Musculoskeletal: No aggressive appearing focal osseous lesions. Moderate upper thoracic spine degenerative disc disease, unchanged. Healing anterior right seventh, eighth and ninth rib fractures. CT ABDOMEN PELVIS FINDINGS Hepatobiliary: Normal liver with no liver mass. Normal gallbladder with no radiopaque cholelithiasis. No biliary ductal dilatation. Pancreas: Chronic diffuse pancreatic parenchymal atrophy with no pancreatic mass or duct dilation. Spleen: Normal size. No mass. Adrenals/Urinary Tract: Normal adrenals. Normal kidneys with no hydronephrosis and no renal mass. Normal nondistended bladder. Stomach/Bowel: Small hiatal hernia. Otherwise normal nondistended stomach. Normal caliber small bowel with no small bowel wall thickening. Normal appendix. Normal large bowel with no diverticulosis, large bowel wall thickening or pericolonic fat stranding. Vascular/Lymphatic: Atherosclerotic nonaneurysmal abdominal aorta.  Patent portal, splenic, hepatic and renal veins. No pathologically enlarged lymph nodes in the abdomen or pelvis. Reproductive: Grossly normal uterus.  No adnexal mass. Other: No pneumoperitoneum, ascites or focal fluid collection. Musculoskeletal: No aggressive appearing focal osseous lesions. Stable subcentimeter sclerotic L4 vertebral lesion, favor a bone island. Moderate lumbar spondylosis. Nondisplaced healing right L3 transverse process fracture. IMPRESSION: 1. No findings highly suspicious for metastatic disease in the chest, abdomen or pelvis. 2. New indistinct clustered subsolid subcentimeter pulmonary nodules in the posterior left lower lobe, indeterminate, favor infectious/inflammatory etiology. Suggest attention on follow-up chest CT in 3 months. 3. Healing anterior right seventh, eighth and ninth rib fractures. Nondisplaced healing right L3 transverse process fracture. Correlate with interval trauma history. 4. Aortic Atherosclerosis (ICD10-I70.0). Electronically Signed   By: Ilona Sorrel M.D.   On: 02/25/2020 13:15    ASSESSMENT & PLAN:   Malignant melanoma of skin of lower limb, including hip (HCC) #Stage IV melanoma-currently NED; DEC 28th CT scan-no evidence of any metastatic disease; subcentimeter subpleural nodules left lung posterior lobe [see below]; healing right-sided rib fractures [see below]  # 1x1 cm left paraspinous cystic lesion at L4/intermittent back pain-given history of melanoma-recommend MRI for further evaluation.  Ordered ASAP.  #December 2021 CT scan new indistinct clustered subsolid subcentimeter pulmonary nodules in the posterior left lower lob-less likely malignant most likely infectious/inflammatory. Recommend imaging 3 months/end of March 2022.  #Status post mechanical fall- healing anterior right seventh, eighth and ninth rib fractures. Nondisplaced healing right L3 transverse process fracture.  Monitor closely.  #CKD stage III-stable clinically.  #  DISPOSITION: # MRI lumbar spine ASAP # follow up TBD-Dr.B  All questions were answered. The patient knows to call the clinic with any problems, questions or concerns.    Cammie Sickle, MD 03/19/2020 9:32 AM

## 2020-03-20 ENCOUNTER — Ambulatory Visit
Admission: RE | Admit: 2020-03-20 | Discharge: 2020-03-20 | Disposition: A | Discharge status: Home / Self Care | Payer: Medicare Other | Source: Ambulatory Visit | Attending: Internal Medicine | Admitting: Internal Medicine

## 2020-03-20 ENCOUNTER — Encounter: Payer: Self-pay | Admitting: Internal Medicine

## 2020-03-20 DIAGNOSIS — G8929 Other chronic pain: Secondary | ICD-10-CM | POA: Diagnosis present

## 2020-03-20 DIAGNOSIS — C4371 Malignant melanoma of right lower limb, including hip: Secondary | ICD-10-CM | POA: Insufficient documentation

## 2020-03-20 DIAGNOSIS — M545 Low back pain, unspecified: Secondary | ICD-10-CM | POA: Diagnosis present

## 2020-03-20 MED ORDER — GADOBUTROL 1 MMOL/ML IV SOLN
7.0000 mL | Freq: Once | INTRAVENOUS | Status: AC | PRN
  Administered 2020-03-20: 7 mL via INTRAVENOUS

## 2020-03-21 ENCOUNTER — Ambulatory Visit: Payer: Medicare Other

## 2020-03-23 ENCOUNTER — Telehealth: Payer: Self-pay | Admitting: Internal Medicine

## 2020-03-23 NOTE — Telephone Encounter (Signed)
On 1/24-I called patient and reviewed the results of the recent MRI negative for any acute process.  Suspect subcutaneous cysts not evident on the MRI.  Chronic pain suspected secondary to chronic arthritis.  Follow-up as planned in April.

## 2020-04-29 ENCOUNTER — Other Ambulatory Visit: Payer: Self-pay | Admitting: Oncology

## 2020-05-22 ENCOUNTER — Ambulatory Visit (HOSPITAL_COMMUNITY): Admission: RE | Admit: 2020-05-22 | Payer: Medicare Other | Source: Ambulatory Visit

## 2020-05-22 ENCOUNTER — Other Ambulatory Visit (HOSPITAL_COMMUNITY): Payer: Medicare Other

## 2020-05-22 ENCOUNTER — Ambulatory Visit (HOSPITAL_COMMUNITY): Payer: Medicare Other

## 2020-06-01 ENCOUNTER — Other Ambulatory Visit: Payer: Self-pay

## 2020-06-01 ENCOUNTER — Ambulatory Visit
Admission: RE | Admit: 2020-06-01 | Discharge: 2020-06-01 | Disposition: A | Discharge status: Home / Self Care | Payer: Medicare Other | Source: Ambulatory Visit | Attending: Oncology | Admitting: Oncology

## 2020-06-01 DIAGNOSIS — R911 Solitary pulmonary nodule: Secondary | ICD-10-CM

## 2020-06-01 LAB — POCT I-STAT CREATININE: Creatinine, Ser: 1.7 mg/dL — ABNORMAL HIGH (ref 0.44–1.00)

## 2020-06-01 MED ORDER — IOHEXOL 300 MG/ML  SOLN
75.0000 mL | Freq: Once | INTRAMUSCULAR | Status: AC | PRN
  Administered 2020-06-01: 60 mL via INTRAVENOUS

## 2020-06-02 ENCOUNTER — Inpatient Hospital Stay: Payer: Medicare Other | Attending: Internal Medicine | Admitting: Internal Medicine

## 2020-06-02 ENCOUNTER — Encounter: Payer: Self-pay | Admitting: Internal Medicine

## 2020-06-02 DIAGNOSIS — Z79899 Other long term (current) drug therapy: Secondary | ICD-10-CM | POA: Diagnosis not present

## 2020-06-02 DIAGNOSIS — I429 Cardiomyopathy, unspecified: Secondary | ICD-10-CM | POA: Insufficient documentation

## 2020-06-02 DIAGNOSIS — Z9221 Personal history of antineoplastic chemotherapy: Secondary | ICD-10-CM | POA: Insufficient documentation

## 2020-06-02 DIAGNOSIS — I130 Hypertensive heart and chronic kidney disease with heart failure and stage 1 through stage 4 chronic kidney disease, or unspecified chronic kidney disease: Secondary | ICD-10-CM | POA: Diagnosis not present

## 2020-06-02 DIAGNOSIS — I509 Heart failure, unspecified: Secondary | ICD-10-CM | POA: Diagnosis not present

## 2020-06-02 DIAGNOSIS — R1310 Dysphagia, unspecified: Secondary | ICD-10-CM | POA: Diagnosis not present

## 2020-06-02 DIAGNOSIS — E039 Hypothyroidism, unspecified: Secondary | ICD-10-CM | POA: Insufficient documentation

## 2020-06-02 DIAGNOSIS — F319 Bipolar disorder, unspecified: Secondary | ICD-10-CM | POA: Diagnosis not present

## 2020-06-02 DIAGNOSIS — C4371 Malignant melanoma of right lower limb, including hip: Secondary | ICD-10-CM | POA: Diagnosis not present

## 2020-06-02 DIAGNOSIS — F17210 Nicotine dependence, cigarettes, uncomplicated: Secondary | ICD-10-CM | POA: Diagnosis not present

## 2020-06-02 DIAGNOSIS — E2740 Unspecified adrenocortical insufficiency: Secondary | ICD-10-CM | POA: Insufficient documentation

## 2020-06-02 DIAGNOSIS — M810 Age-related osteoporosis without current pathological fracture: Secondary | ICD-10-CM | POA: Insufficient documentation

## 2020-06-02 DIAGNOSIS — E538 Deficiency of other specified B group vitamins: Secondary | ICD-10-CM | POA: Insufficient documentation

## 2020-06-02 DIAGNOSIS — Z85820 Personal history of malignant melanoma of skin: Secondary | ICD-10-CM | POA: Diagnosis present

## 2020-06-02 DIAGNOSIS — R918 Other nonspecific abnormal finding of lung field: Secondary | ICD-10-CM | POA: Diagnosis not present

## 2020-06-02 NOTE — Assessment & Plan Note (Addendum)
#  Stage IV melanoma-currently NED; April 4th, 2022- CT chest -no evidence of any metastatic disease.  Continue surveillance with every 67-month imaging.  #Chronic back pain -MRI Jan 2022 -NED for any active metastatic disease.  1x1 cm left paraspinous cystic lesion at L4/intermittent back pain-stable.  # Dysphagia-solids more than liquids [2 weeks]; intermittently- recommend GI eval [Dr.Elliot EGD- 2018] will refer to GI.  #CKD stage III-stable clinically.  #Fatigue/Hypothyroidsm [followed by Dr.Solum] stable  # DISPOSITION: # referral to [facility] re: dysphagia # follow up in 6 months- MD; labs- cbc/cmp; ldh; CT C/A/P prior- Dr.B  # I reviewed the blood work- with the patient in detail; also reviewed the imaging independently [as summarized above]; and with the patient in detail.

## 2020-06-02 NOTE — Progress Notes (Signed)
[place] NOTE  Patient Care Team: Martie Lee, MD as PCP - General (Family Medicine) Bary Castilla, Forest Gleason, MD as Consulting Physician (General Surgery) Forest Gleason, MD (Inactive) (Oncology) Lloyd Huger, MD as Consulting Physician (Oncology)  CHIEF COMPLAINTS/PURPOSE OF CONSULTATION: Metastatic melanoma  #  Oncology History Overview Note  1. 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 TX, Nx, M1 tumor, stage IV. 2. 4 weeks of high in the Intron therapy high dose in February of 2009. 3. Low-dose interferon started from March of 2009  AUG 2012- Right thigh mass [Bx] Recurrent melanoma in the right thigh/ pelvic LN [PETscan]; NEG for BRAF V600E; April 2017 PET scan showed recurrent disease in the right inguinal region which was confirmed by biopsy. Zeboraf discontinued in November 2017 secondary to intolerable rash. Patient initiated combination immunotherapy with nivolumab and ipilumimab on January 14, 2016. Patient initiated maintenance nivolumab on April 14, 2016 and discontinued on November 07, 2016.  Nivolumab was discontinued secondary to persistent diarrhea.  Single agent oral Mekinist from December 07, 2016 through December 2018 [Dr.Finnegan]  # OCT 2012- IPI x 2 cycles [stopped sec to intol- Side effects]  # April 2017- PET scan shows recurrent disease with- RIGHT INGUINAL adenopathy s/p Bx [Dr.Byrnett].June 2017 [OSH]- right external Iliac LN-  RT  # Vitamin B 12 deficiency      Malignant melanoma of skin of lower limb, including hip (HCC)   Initial Diagnosis   Malignant melanoma of skin of lower limb, including hip (HCC)      HISTORY OF PRESENTING ILLNESS:  Patricia Li 60 y.o.  female history of metastatic melanoma currently NED-is here for follow-up/review results of the CT scan.  Patient denies any new shortness of breath or cough.  Denies any nausea vomiting.  Denies any headaches.  Complains of fatigue  ongoing.  Patient wants to have intermittent episodes of difficulty swallowing pain with swallowing-solids more than liquids.  Review of Systems  Constitutional: Negative for chills, diaphoresis, fever, malaise/fatigue and weight loss.  HENT: Negative for nosebleeds and sore throat.   Eyes: Negative for double vision.  Respiratory: Negative for cough, hemoptysis, sputum production, shortness of breath and wheezing.   Cardiovascular: Negative for chest pain, palpitations, orthopnea and leg swelling.  Gastrointestinal: Negative for abdominal pain, blood in stool, constipation, diarrhea, heartburn, melena, nausea and vomiting.  Genitourinary: Negative for dysuria, frequency and urgency.  Musculoskeletal: Positive for back pain and joint pain.  Skin: Negative.  Negative for itching and rash.  Neurological: Negative for dizziness, tingling, focal weakness, weakness and headaches.  Endo/Heme/Allergies: Does not bruise/bleed easily.  Psychiatric/Behavioral: Negative for depression. The patient is not nervous/anxious and does not have insomnia.      MEDICAL HISTORY:  Past Medical History:  Diagnosis Date  . Adrenal insufficiency ([place])   . Bipolar disorder ([place])   . Cardiomyopathy ([place])   . CHF (congestive heart failure) ([place]) 2016   Dr. Nehemiah Massed  . Depression   . GERD (gastroesophageal reflux disease) 2012  . Hypertension   . Hypothyroidism   . Joint pain   . Lumbar herniated disc 2010  . Malignant melanoma of skin of lower limb, including hip ([place]) 2008, 2012, 2017   right thigh/2017- stage 4  . Osteoporosis, postmenopausal   . Overactive bladder   . Pancreatitis   . Panhypopituitarism ([place])   . Personal history of chemotherapy 2008   melonoma  . Personal history of malignant melanoma of skin 2012  . Personal history of  radiation therapy 2017   melonoma  . Rectal bleeding   . Renal insufficiency   . Supraglottitis   . Tendonitis   . Tick bite of head 07/20/2015  . Weight  loss, unintentional     SURGICAL HISTORY: Past Surgical History:  Procedure Laterality Date  . BACK SURGERY  2010  . BREAST CYST EXCISION  1985  . COLONOSCOPY  07/2012   [facility]  . COLONOSCOPY WITH PROPOFOL N/A 11/21/2016   Procedure: COLONOSCOPY WITH PROPOFOL;  Surgeon: Manya Silvas, MD;  Location: [facility] ENDOSCOPY;  Service: Endoscopy;  Laterality: N/A;  . ESOPHAGOGASTRODUODENOSCOPY    . ESOPHAGOGASTRODUODENOSCOPY (EGD) WITH PROPOFOL N/A 11/21/2016   Procedure: ESOPHAGOGASTRODUODENOSCOPY (EGD) WITH PROPOFOL;  Surgeon: Manya Silvas, MD;  Location: [facility] ENDOSCOPY;  Service: Endoscopy;  Laterality: N/A;  . FRACTURE SURGERY     rod removed from left femur 07/21/2014  . INGUINAL LYMPH NODE BIOPSY Right 07/17/2015   Procedure: INGUINAL LYMPH NODE BIOPSY/EXCISION;  Surgeon: Robert Bellow, MD;  Location: [facility] ORS;  Service: General;  Laterality: Right;  . laminnotomy reexploration posterior lumbar   12/26/2008   with nerve decomp and discectomy  . MELANOMA EXCISION  2008   right thigh  . PORTACATH PLACEMENT Left 03/14/2016   Procedure: INSERTION PORT-A-CATH;  Surgeon: Robert Bellow, MD;  Location: [facility] ORS;  Service: General;  Laterality: Left;  . SKIN LESION EXCISION Right 12-19-10   posterior  . SPINE SURGERY      SOCIAL HISTORY: Social History   Socioeconomic History  . Marital status: Divorced    Spouse name: Not on file  . Number of children: Not on file  . Years of education: Not on file  . Highest education level: Not on file  Occupational History  . Not on file  Tobacco Use  . Smoking status: Current Every Day Smoker    Packs/day: 0.50    Years: 20.00    Pack years: 10.00    Types: Cigarettes  . Smokeless tobacco: Never Used  Vaping Use  . Vaping Use: Never used  Substance and Sexual Activity  . Alcohol use: Yes    Alcohol/week: 0.0 standard drinks    Comment: ocassionally  . Drug use: No  . Sexual activity: Not on file  Other Topics Concern  . Not  on file  Social History Narrative  . Not on file   Social Determinants of Health   Financial Resource Strain: Not on file  Food Insecurity: Not on file  Transportation Needs: Not on file  Physical Activity: Not on file  Stress: Not on file  Social Connections: Not on file  Intimate Partner Violence: Not on file    FAMILY HISTORY: Family History  Problem Relation Age of Onset  . COPD Father   . Cancer Sister        Colon, sister  . Breast cancer Neg Hx     ALLERGIES:  is allergic to clindamycin, levaquin [levofloxacin], aripiprazole, depakote [divalproex sodium], valproic acid, ondansetron hcl, and promethazine.  MEDICATIONS:  Current Outpatient Medications  Medication Sig Dispense Refill  . calcitRIOL (ROCALTROL) 0.25 MCG capsule Take 0.25 mcg by mouth daily.  6  . clonazePAM (KLONOPIN) 0.5 MG tablet Take 0.5 mg by mouth at bedtime.    . cyanocobalamin (,VITAMIN B-12,) 1000 MCG/ML injection ! Injection once a week for 4 weeks and then 1 injections once a month for 4 months    . hydrocortisone (CORTEF) 10 MG tablet Take 10-15 mg by mouth 2 (two) times  daily. 1.5 tablets (15 MG) in the morning and 1 tablet (10 MG) in the evening    . levothyroxine (SYNTHROID, LEVOTHROID) 137 MCG tablet Take 150 mcg by mouth daily.     . Metoclopramide HCl (REGLAN PO) Take by mouth.    . Multiple Vitamin (MULTI-VITAMIN) tablet Take 1 tablet by mouth 1 day or 1 dose.    . pantoprazole (PROTONIX) 40 MG tablet TAKE 1 TABLET BY MOUTH TWICE A DAY 180 tablet 1  . pregabalin (LYRICA) 50 MG capsule Take 25 mg by mouth 3 (three) times daily.    . QUEtiapine (SEROQUEL) 25 MG tablet Take 1 tablet by mouth at bedtime.    Marland Kitchen venlafaxine XR (EFFEXOR-XR) 150 MG 24 hr capsule Take 1 capsule by mouth 2 (two) times daily.    . Cholecalciferol (VITAMIN D3) 1.25 MG (50000 UT) CAPS Take 1 capsule by mouth once a week.     No current facility-administered medications for this visit.      Marland Kitchen  PHYSICAL  EXAMINATION: ECOG PERFORMANCE STATUS: 0 - Asymptomatic  Vitals:   06/02/20 1312  BP: 134/72  Pulse: 65  Resp: 16  Temp: 97.6 F (36.4 C)  SpO2: 100%   Filed Weights   06/02/20 1312  Weight: 138 lb (62.6 kg)    Physical Exam Constitutional:      Comments: Alone.  Ambulating independently.  HENT:     Head: Normocephalic and atraumatic.     Mouth/Throat:     Pharynx: No oropharyngeal exudate.  Eyes:     Pupils: Pupils are equal, round, and reactive to light.  Cardiovascular:     Rate and Rhythm: Normal rate and regular rhythm.  Pulmonary:     Effort: Pulmonary effort is normal. No respiratory distress.     Breath sounds: Normal breath sounds. No wheezing.  Abdominal:     General: Bowel sounds are normal. There is no distension.     Palpations: Abdomen is soft. There is no mass.     Tenderness: There is no abdominal tenderness. There is no guarding or rebound.  Musculoskeletal:        General: No tenderness. Normal range of motion.     Cervical back: Normal range of motion and neck supple.     Comments: At L4 level -1 x 1 cm left paraspinal/cystic lesion-nontender.  Skin:    General: Skin is warm.  Neurological:     Mental Status: She is alert and oriented to person, place, and time.  Psychiatric:        Mood and Affect: Affect normal.    LABORATORY DATA:  I have reviewed the data as listed Lab Results  Component Value Date   WBC 9.8 02/25/2020   HGB 12.9 02/25/2020   HCT 38.8 02/25/2020   MCV 92.4 02/25/2020   PLT 236 02/25/2020   Recent Labs    08/19/19 1250 02/25/20 0850 06/01/20 1037  NA 135 133*  --   K 4.3 4.1  --   CL 100 97*  --   CO2 27 29  --   GLUCOSE 80 101*  --   BUN 12 11  --   CREATININE 1.48* 1.48* 1.70*  CALCIUM 8.6* 9.1  --   GFRNONAA 39* 41*  --   GFRAA 45*  --   --   PROT 6.4* 7.0  --   ALBUMIN 3.9 4.1  --   AST 28 29  --   ALT 16 16  --   ALKPHOS 55 67  --  BILITOT 0.6 0.6  --     RADIOGRAPHIC STUDIES: I have  personally reviewed the radiological images as listed and agreed with the findings in the report. CT Chest W Contrast  Result Date: 06/01/2020 CLINICAL DATA:  Follow-up pulmonary nodules. Smoker. Personal history of melanoma. EXAM: CT CHEST WITH CONTRAST TECHNIQUE: Multidetector CT imaging of the chest was performed during intravenous contrast administration. CONTRAST:  71m OMNIPAQUE IOHEXOL 300 MG/ML  SOLN COMPARISON:  02/25/2020 FINDINGS: Cardiovascular: No acute findings. Aortic and coronary atherosclerotic calcification noted. Mediastinum/Nodes: No masses or pathologically enlarged lymph nodes identified on this unenhanced exam. Lungs/Pleura: Previously seen cluster of poorly defined sub-solid nodules in the posterior left lower lobe has resolved, consistent with resolving inflammatory/infectious etiology. A few tiny less than 5 mm left upper lobe pulmonary nodules remains stable. No new or enlarging pulmonary nodules or masses are identified. No evidence of pulmonary infiltrate or pleural effusion. Upper Abdomen:  Unremarkable. Musculoskeletal:  No suspicious bone lesions. IMPRESSION: Interval resolution of sub-solid nodules in posterior left lower lobe, consistent with resolved inflammatory/infectious etiology. No evidence of malignancy.  No active disease within the thorax. Aortic Atherosclerosis (ICD10-I70.0). Electronically Signed   By: JMarlaine HindM.D.   On: 06/01/2020 16:42    ASSESSMENT & PLAN:   Malignant melanoma of skin of lower limb, including hip ([place] #Stage IV melanoma-currently NED; April 4th, 2022- CT chest -no evidence of any metastatic disease.  Continue surveillance with every 675-monthmaging.  #Chronic back pain -MRI Jan 2022 -NED for any active metastatic disease.  1x1 cm left paraspinous cystic lesion at L4/intermittent back pain-stable.  # Dysphagia-solids more than liquids [2 weeks]; intermittently- recommend GI eval [Dr.Elliot EGD- 2018] will refer to GI.  #CKD stage  III-stable clinically.  #Fatigue/Hypothyroidsm [followed by Dr.Solum] stable  # DISPOSITION: # [place] [facility]: dysphagia # follow up in 6 months- MD; labs- cbc/cmp; ldh; CT C/A/P prior- Dr.B  # I reviewed the blood work- with the patient in detail; also reviewed the imaging independently [as summarized above]; and with the patient in detail.    All questions were answered. The patient knows to call the clinic with any problems, questions or concerns.    GoCammie SickleMD 06/02/2020 2:18 PM

## 2020-06-03 ENCOUNTER — Ambulatory Visit (HOSPITAL_COMMUNITY): Payer: Medicare Other

## 2020-06-03 ENCOUNTER — Encounter (HOSPITAL_COMMUNITY): Payer: Self-pay

## 2020-06-03 ENCOUNTER — Other Ambulatory Visit (HOSPITAL_COMMUNITY): Payer: Medicare Other

## 2020-06-08 ENCOUNTER — Ambulatory Visit (HOSPITAL_COMMUNITY)
Admission: RE | Admit: 2020-06-08 | Discharge: 2020-06-08 | Disposition: A | Discharge status: Home / Self Care | Payer: Medicare Other | Source: Ambulatory Visit | Attending: Neurology | Admitting: Neurology

## 2020-06-08 ENCOUNTER — Encounter: Payer: Self-pay | Admitting: Internal Medicine

## 2020-06-08 ENCOUNTER — Other Ambulatory Visit: Payer: Self-pay

## 2020-06-08 ENCOUNTER — Other Ambulatory Visit: Payer: Self-pay | Admitting: *Deleted

## 2020-06-08 DIAGNOSIS — M4802 Spinal stenosis, cervical region: Secondary | ICD-10-CM | POA: Diagnosis present

## 2020-06-08 DIAGNOSIS — R4189 Other symptoms and signs involving cognitive functions and awareness: Secondary | ICD-10-CM | POA: Insufficient documentation

## 2020-06-08 DIAGNOSIS — Z1231 Encounter for screening mammogram for malignant neoplasm of breast: Secondary | ICD-10-CM

## 2020-07-03 ENCOUNTER — Encounter: Payer: Self-pay | Admitting: Internal Medicine

## 2020-07-07 ENCOUNTER — Telehealth: Payer: Self-pay | Admitting: Internal Medicine

## 2020-07-07 ENCOUNTER — Encounter: Payer: Self-pay | Admitting: Internal Medicine

## 2020-07-07 NOTE — Telephone Encounter (Signed)
On 5/10-I reviewed the patient's labs from nephrologist office hemoglobin around 10/worsened.  Left a voicemail for the patient-that she would need follow-up/work-up for anemia.   H/T-please reach out to her-if patient is interested in follow-up- schedule in 3-4 weeks- MD; labs-cbc/cmp/iron studies/ferritin; X11 folic acid; MM panel; K/l light chains; LDH  GB

## 2020-07-08 NOTE — Telephone Encounter (Signed)
LVM for pt to call back in regards to message below.  

## 2020-07-08 NOTE — Telephone Encounter (Signed)
Patient scheduled for 08/05/20 for lab/md. Pt aware of appointment.

## 2020-07-30 ENCOUNTER — Other Ambulatory Visit: Payer: Self-pay | Admitting: *Deleted

## 2020-07-30 MED ORDER — PANTOPRAZOLE SODIUM 40 MG PO TBEC: 1.0000 | DELAYED_RELEASE_TABLET | Freq: Two times a day (BID) | ORAL | 1 refills | Status: DC

## 2020-08-05 ENCOUNTER — Inpatient Hospital Stay: Payer: Medicare Other | Attending: Internal Medicine

## 2020-08-05 ENCOUNTER — Encounter: Payer: Self-pay | Admitting: Internal Medicine

## 2020-08-05 ENCOUNTER — Inpatient Hospital Stay: Payer: Medicare Other | Admitting: Internal Medicine

## 2020-08-05 DIAGNOSIS — F17210 Nicotine dependence, cigarettes, uncomplicated: Secondary | ICD-10-CM | POA: Diagnosis not present

## 2020-08-05 DIAGNOSIS — D631 Anemia in chronic kidney disease: Secondary | ICD-10-CM | POA: Diagnosis not present

## 2020-08-05 DIAGNOSIS — K219 Gastro-esophageal reflux disease without esophagitis: Secondary | ICD-10-CM | POA: Diagnosis not present

## 2020-08-05 DIAGNOSIS — M810 Age-related osteoporosis without current pathological fracture: Secondary | ICD-10-CM | POA: Diagnosis not present

## 2020-08-05 DIAGNOSIS — G8929 Other chronic pain: Secondary | ICD-10-CM | POA: Diagnosis not present

## 2020-08-05 DIAGNOSIS — C4371 Malignant melanoma of right lower limb, including hip: Secondary | ICD-10-CM

## 2020-08-05 DIAGNOSIS — I509 Heart failure, unspecified: Secondary | ICD-10-CM | POA: Diagnosis not present

## 2020-08-05 DIAGNOSIS — N183 Chronic kidney disease, stage 3 unspecified: Secondary | ICD-10-CM | POA: Insufficient documentation

## 2020-08-05 DIAGNOSIS — M549 Dorsalgia, unspecified: Secondary | ICD-10-CM | POA: Insufficient documentation

## 2020-08-05 DIAGNOSIS — Z79899 Other long term (current) drug therapy: Secondary | ICD-10-CM | POA: Insufficient documentation

## 2020-08-05 DIAGNOSIS — I429 Cardiomyopathy, unspecified: Secondary | ICD-10-CM | POA: Insufficient documentation

## 2020-08-05 DIAGNOSIS — E039 Hypothyroidism, unspecified: Secondary | ICD-10-CM | POA: Insufficient documentation

## 2020-08-05 DIAGNOSIS — R634 Abnormal weight loss: Secondary | ICD-10-CM | POA: Diagnosis not present

## 2020-08-05 DIAGNOSIS — I130 Hypertensive heart and chronic kidney disease with heart failure and stage 1 through stage 4 chronic kidney disease, or unspecified chronic kidney disease: Secondary | ICD-10-CM | POA: Diagnosis not present

## 2020-08-05 DIAGNOSIS — Z85820 Personal history of malignant melanoma of skin: Secondary | ICD-10-CM | POA: Diagnosis present

## 2020-08-05 LAB — CBC WITH DIFFERENTIAL/PLATELET
Abs Immature Granulocytes: 0.04 10*3/uL (ref 0.00–0.07)
Basophils Absolute: 0 10*3/uL (ref 0.0–0.1)
Basophils Relative: 1 %
Eosinophils Absolute: 0.1 10*3/uL (ref 0.0–0.5)
Eosinophils Relative: 1 %
HCT: 34.2 % — ABNORMAL LOW (ref 36.0–46.0)
Hemoglobin: 11.7 g/dL — ABNORMAL LOW (ref 12.0–15.0)
Immature Granulocytes: 1 %
Lymphocytes Relative: 16 %
Lymphs Abs: 1.1 10*3/uL (ref 0.7–4.0)
MCH: 31.2 pg (ref 26.0–34.0)
MCHC: 34.2 g/dL (ref 30.0–36.0)
MCV: 91.2 fL (ref 80.0–100.0)
Monocytes Absolute: 0.2 10*3/uL (ref 0.1–1.0)
Monocytes Relative: 3 %
Neutro Abs: 5.7 10*3/uL (ref 1.7–7.7)
Neutrophils Relative %: 78 %
Platelets: 189 10*3/uL (ref 150–400)
RBC: 3.75 MIL/uL — ABNORMAL LOW (ref 3.87–5.11)
RDW: 12.3 % (ref 11.5–15.5)
WBC: 7.1 10*3/uL (ref 4.0–10.5)
nRBC: 0 % (ref 0.0–0.2)

## 2020-08-05 LAB — COMPREHENSIVE METABOLIC PANEL
ALT: 20 U/L (ref 0–44)
AST: 37 U/L (ref 15–41)
Albumin: 4 g/dL (ref 3.5–5.0)
Alkaline Phosphatase: 50 U/L (ref 38–126)
Anion gap: 9 (ref 5–15)
BUN: 16 mg/dL (ref 6–20)
CO2: 26 mmol/L (ref 22–32)
Calcium: 9.4 mg/dL (ref 8.9–10.3)
Chloride: 100 mmol/L (ref 98–111)
Creatinine, Ser: 1.86 mg/dL — ABNORMAL HIGH (ref 0.44–1.00)
GFR, Estimated: 31 mL/min — ABNORMAL LOW (ref >60)
Glucose, Bld: 168 mg/dL — ABNORMAL HIGH (ref 70–99)
Potassium: 4.8 mmol/L (ref 3.5–5.1)
Sodium: 135 mmol/L (ref 135–145)
Total Bilirubin: 0.7 mg/dL (ref 0.3–1.2)
Total Protein: 6.4 g/dL — ABNORMAL LOW (ref 6.5–8.1)

## 2020-08-05 LAB — LACTATE DEHYDROGENASE: LDH: 135 U/L (ref 98–192)

## 2020-08-05 NOTE — Progress Notes (Signed)
[place] NOTE  Patient Care Team: Martie Lee, MD as PCP - General (Family Medicine) Cammie Sickle, MD as Consulting Physician (Internal Medicine) Efrain Sella, MD as Consulting Physician (Gastroenterology) Bary Castilla Forest Gleason, MD as Consulting Physician (General Surgery)  CHIEF COMPLAINTS/PURPOSE OF CONSULTATION: Metastatic melanoma  #  Oncology History Overview Note  1. 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 TX, Nx, M1 tumor, stage IV. 2. 4 weeks of high in the Intron therapy high dose in February of 2009. 3. Low-dose interferon started from March of 2009  AUG 2012- Right thigh mass [Bx] Recurrent melanoma in the right thigh/ pelvic LN [PETscan]; NEG for BRAF V600E; April 2017 PET scan showed recurrent disease in the right inguinal region which was confirmed by biopsy. Zeboraf discontinued in November 2017 secondary to intolerable rash. Patient initiated combination immunotherapy with nivolumab and ipilumimab on January 14, 2016. Patient initiated maintenance nivolumab on April 14, 2016 and discontinued on November 07, 2016.  Nivolumab was discontinued secondary to persistent diarrhea.  Single agent oral Mekinist from December 07, 2016 through December 2018 [Dr.Finnegan]  # OCT 2012- IPI x 2 cycles [stopped sec to intol- Side effects]  # April 2017- PET scan shows recurrent disease with- RIGHT INGUINAL adenopathy s/p Bx [Dr.Byrnett].June 2017 [OSH]- right external Iliac LN-  RT  # Vitamin B 12 deficiency      Malignant melanoma of skin of lower limb, including hip (HCC)   Initial Diagnosis   Malignant melanoma of skin of lower limb, including hip (HCC)       HISTORY OF PRESENTING ILLNESS:  Patricia Li 60 y.o.  female history of metastatic melanoma currently NED-is here for follow-up/with regards to worsening anemia.  Patient's hemoglobin was noted to be 10.3 recently with her PCP.  Patient denies any blood in  stools or black-colored stools.  No nausea no vomiting.  Chronic fatigue.  Chronic back pain not any worse.  Review of Systems  Constitutional:  Positive for malaise/fatigue. Negative for chills, diaphoresis, fever and weight loss.  HENT:  Negative for nosebleeds and sore throat.   Eyes:  Negative for double vision.  Respiratory:  Negative for cough, hemoptysis, sputum production, shortness of breath and wheezing.   Cardiovascular:  Negative for chest pain, palpitations, orthopnea and leg swelling.  Gastrointestinal:  Negative for abdominal pain, blood in stool, constipation, diarrhea, heartburn, melena, nausea and vomiting.  Genitourinary:  Negative for dysuria, frequency and urgency.  Musculoskeletal:  Positive for back pain and joint pain.  Skin: Negative.  Negative for itching and rash.  Neurological:  Negative for dizziness, tingling, focal weakness, weakness and headaches.  Endo/Heme/Allergies:  Does not bruise/bleed easily.  Psychiatric/Behavioral:  Negative for depression. The patient is not nervous/anxious and does not have insomnia.     MEDICAL HISTORY:  Past Medical History:  Diagnosis Date   Adrenal insufficiency ([place])    Bipolar disorder ([place])    Cardiomyopathy ([place])    CHF (congestive heart failure) ([place]) 2016   Dr. Nehemiah Massed   Depression    GERD (gastroesophageal reflux disease) 2012   Hypertension    Hypothyroidism    Joint pain    Lumbar herniated disc 2010   Malignant melanoma of skin of lower limb, including hip ([place]) 2008, 2012, 2017   right thigh/2017- stage 4   Osteoporosis, postmenopausal    Overactive bladder    Pancreatitis    Panhypopituitarism ([place])    Personal history of chemotherapy 2008   melonoma  Personal history of malignant melanoma of skin 2012   Personal history of radiation therapy 2017   melonoma   Rectal bleeding    Renal insufficiency    Supraglottitis    Tendonitis    Tick bite of head 07/20/2015   Weight loss, unintentional      SURGICAL HISTORY: Past Surgical History:  Procedure Laterality Date   BACK SURGERY  2010   BREAST CYST EXCISION  1985   COLONOSCOPY  07/2012   [facility]   COLONOSCOPY WITH PROPOFOL N/A 11/21/2016   Procedure: COLONOSCOPY WITH PROPOFOL;  Surgeon: Manya Silvas, MD;  Location: [facility] ENDOSCOPY;  Service: Endoscopy;  Laterality: N/A;   ESOPHAGOGASTRODUODENOSCOPY     ESOPHAGOGASTRODUODENOSCOPY (EGD) WITH PROPOFOL N/A 11/21/2016   Procedure: ESOPHAGOGASTRODUODENOSCOPY (EGD) WITH PROPOFOL;  Surgeon: Manya Silvas, MD;  Location: [facility] ENDOSCOPY;  Service: Endoscopy;  Laterality: N/A;   FRACTURE SURGERY     rod removed from left femur 07/21/2014   INGUINAL LYMPH NODE BIOPSY Right 07/17/2015   Procedure: INGUINAL LYMPH NODE BIOPSY/EXCISION;  Surgeon: Robert Bellow, MD;  Location: [facility] ORS;  Service: General;  Laterality: Right;   laminnotomy reexploration posterior lumbar   12/26/2008   with nerve decomp and discectomy   MELANOMA EXCISION  2008   right thigh   PORTACATH PLACEMENT Left 03/14/2016   Procedure: INSERTION PORT-A-CATH;  Surgeon: Robert Bellow, MD;  Location: [facility] ORS;  Service: General;  Laterality: Left;   SKIN LESION EXCISION Right 12-19-10   posterior   SPINE SURGERY      SOCIAL HISTORY: Social History   Socioeconomic History   Marital status: Divorced    Spouse name: Not on file   Number of children: Not on file   Years of education: Not on file   Highest education level: Not on file  Occupational History   Not on file  Tobacco Use   Smoking status: Every Day    Packs/day: 0.50    Years: 20.00    Pack years: 10.00    Types: Cigarettes   Smokeless tobacco: Never  Vaping Use   Vaping Use: Never used  Substance and Sexual Activity   Alcohol use: Yes    Alcohol/week: 0.0 standard drinks    Comment: ocassionally   Drug use: No   Sexual activity: Not on file  Other Topics Concern   Not on file  Social History Narrative   Not on file   Social  Determinants of Health   Financial Resource Strain: Not on file  Food Insecurity: Not on file  Transportation Needs: Not on file  Physical Activity: Not on file  Stress: Not on file  Social Connections: Not on file  Intimate Partner Violence: Not on file    FAMILY HISTORY: Family History  Problem Relation Age of Onset   COPD Father    Cancer Sister        Colon, sister   Breast cancer Neg Hx     ALLERGIES:  is allergic to clindamycin, levaquin [levofloxacin], aripiprazole, depakote [divalproex sodium], valproic acid, ondansetron hcl, and promethazine.  MEDICATIONS:  Current Outpatient Medications  Medication Sig Dispense Refill   calcitRIOL (ROCALTROL) 0.25 MCG capsule Take 0.25 mcg by mouth daily.  6   Cholecalciferol (VITAMIN D3) 1.25 MG (50000 UT) CAPS Take 1 capsule by mouth once a week.     clonazePAM (KLONOPIN) 0.5 MG tablet Take 0.5 mg by mouth at bedtime.     Cobalamin Combinations (B-12) [phone] MCG SUBL  cyanocobalamin (,VITAMIN B-12,) 1000 MCG/ML injection ! Injection once a week for 4 weeks and then 1 injections once a month for 4 months     hydrocortisone (CORTEF) 10 MG tablet Take 10-15 mg by mouth 2 (two) times daily. 1.5 tablets (15 MG) in the morning and 1 tablet (10 MG) in the evening     levothyroxine (SYNTHROID) 125 MCG tablet Take 125 mcg by mouth daily.     levothyroxine (SYNTHROID, LEVOTHROID) 137 MCG tablet Take 150 mcg by mouth daily.      lubiprostone (AMITIZA) 24 MCG capsule Take 24 mcg by mouth 2 (two) times daily.     memantine (NAMENDA) 5 MG tablet Take 5 mg by mouth 2 (two) times daily.     pantoprazole (PROTONIX) 40 MG tablet Take 1 tablet (40 mg total) by mouth 2 (two) times daily. 180 tablet 1   pregabalin (LYRICA) 50 MG capsule Take 25 mg by mouth 3 (three) times daily.     propranolol ER (INDERAL LA) 60 MG 24 hr capsule Take 60 mg by mouth daily.     QUEtiapine (SEROQUEL) 25 MG tablet Take 1 tablet by mouth at bedtime.     rosuvastatin  (CRESTOR) 5 MG tablet Take 5 mg by mouth daily.     venlafaxine XR (EFFEXOR-XR) 150 MG 24 hr capsule Take 1 capsule by mouth 2 (two) times daily.     benzonatate (TESSALON) 100 MG capsule Take by mouth. (Patient not taking: Reported on 08/05/2020)     Metoclopramide HCl (REGLAN PO) Take by mouth. (Patient not taking: Reported on 08/05/2020)     Multiple Vitamin (MULTI-VITAMIN) tablet Take 1 tablet by mouth 1 day or 1 dose. (Patient not taking: Reported on 08/05/2020)     nortriptyline (PAMELOR) 10 MG capsule Take 10 mg by mouth at bedtime. (Patient not taking: Reported on 08/05/2020)     No current facility-administered medications for this visit.      Marland Kitchen  PHYSICAL EXAMINATION: ECOG PERFORMANCE STATUS: 0 - Asymptomatic  Vitals:   08/05/20 1504  BP: (!) 105/53  Pulse: 69  Resp: 18  Temp: 98.7 F (37.1 C)  SpO2: 99%   Filed Weights   08/05/20 1504  Weight: 133 lb (60.3 kg)    Physical Exam Constitutional:      Comments: Alone.  Ambulating independently.  HENT:     Head: Normocephalic and atraumatic.     Mouth/Throat:     Pharynx: No oropharyngeal exudate.  Eyes:     Pupils: Pupils are equal, round, and reactive to light.  Cardiovascular:     Rate and Rhythm: Normal rate and regular rhythm.  Pulmonary:     Effort: Pulmonary effort is normal. No respiratory distress.     Breath sounds: Normal breath sounds. No wheezing.  Abdominal:     General: Bowel sounds are normal. There is no distension.     Palpations: Abdomen is soft. There is no mass.     Tenderness: There is no abdominal tenderness. There is no guarding or rebound.  Musculoskeletal:        General: No tenderness. Normal range of motion.     Cervical back: Normal range of motion and neck supple.     Comments: At L4 level -1 x 1 cm left paraspinal/cystic lesion-nontender.  Skin:    General: Skin is warm.  Neurological:     Mental Status: She is alert and oriented to person, place, and time.  Psychiatric:        Mood  and Affect: Affect normal.   LABORATORY DATA:  I have reviewed the data as listed Lab Results  Component Value Date   WBC 7.1 08/05/2020   HGB 11.7 (L) 08/05/2020   HCT 34.2 (L) 08/05/2020   MCV 91.2 08/05/2020   PLT 189 08/05/2020   Recent Labs    08/19/19 1250 02/25/20 0850 06/01/20 1037 08/05/20 1448  NA 135 133*  --  135  K 4.3 4.1  --  4.8  CL 100 97*  --  100  CO2 27 29  --  26  GLUCOSE 80 101*  --  168*  BUN 12 11  --  16  CREATININE 1.48* 1.48* 1.70* 1.86*  CALCIUM 8.6* 9.1  --  9.4  GFRNONAA 39* 41*  --  31*  GFRAA 45*  --   --   --   PROT 6.4* 7.0  --  6.4*  ALBUMIN 3.9 4.1  --  4.0  AST 28 29  --  37  ALT 16 16  --  20  ALKPHOS 55 67  --  50  BILITOT 0.6 0.6  --  0.7    RADIOGRAPHIC STUDIES: I have personally reviewed the radiological images as listed and agreed with the findings in the report. No results found.  ASSESSMENT & PLAN:   Malignant melanoma of skin of lower limb, including hip ([place]) #Stage IV melanoma-currently NED; April 4th, 2022- CT chest -no evidence of any metastatic disease.  Continue surveillance with every 21-monthimaging.  Stable.  #Chronic back pain -MRI Jan 2022 -NED for any active metastatic disease.  1x1 cm left paraspinous cystic lesion at L4/intermittent back pain-stable  # Anemia/CKD-on PO iron- improved Hb 11.3- Improved; continue the same.   # weight loss/dyaphagia-  ~ 10 pounds in last 4-5 months/unintentional. awaitig evaluation GI EGD/colonoscpoy [end of june]  #CKD stage III-stable clinically.  #Fatigue/Hypothyroidsm [followed by Dr.Solum] stable  # DISPOSITION: # keep appt as planned- Dr.B   All questions were answered. The patient knows to call the clinic with any problems, questions or concerns.    GCammie Sickle MD 08/16/2020 6:50 PM

## 2020-08-05 NOTE — Assessment & Plan Note (Addendum)
#  Stage IV melanoma-currently NED; April 4th, 2022- CT chest -no evidence of any metastatic disease.  Continue surveillance with every 63-month imaging.  Stable.  #Chronic back pain -MRI Jan 2022 -NED for any active metastatic disease.  1x1 cm left paraspinous cystic lesion at L4/intermittent back pain-stable  # Anemia/CKD-on PO iron- improved Hb 11.3- Improved; continue the same.   # weight loss/dyaphagia-  ~ 10 pounds in last 4-5 months/unintentional. awaitig evaluation GI EGD/colonoscpoy [end of june]  #CKD stage III-stable clinically.  #Fatigue/Hypothyroidsm [followed by Dr.Solum] stable  # DISPOSITION: # keep appt as planned- Dr.B

## 2020-08-24 ENCOUNTER — Telehealth: Payer: Self-pay | Admitting: *Deleted

## 2020-08-24 ENCOUNTER — Encounter: Payer: Self-pay | Admitting: Internal Medicine

## 2020-08-24 ENCOUNTER — Other Ambulatory Visit: Payer: Self-pay | Admitting: Internal Medicine

## 2020-08-24 DIAGNOSIS — C439 Malignant melanoma of skin, unspecified: Secondary | ICD-10-CM

## 2020-08-24 NOTE — Telephone Encounter (Signed)
Please schedule pet scan asap and followup with Dr. B 1-2 days post pet scan

## 2020-08-28 NOTE — Telephone Encounter (Signed)
Peer to peer arrange for 345 pm today with Dr. Rogue Bussing.

## 2020-09-01 NOTE — Telephone Encounter (Signed)
Authorization # [id number] #good until August 15'th PET-approved

## 2020-09-07 ENCOUNTER — Ambulatory Visit
Admission: RE | Admit: 2020-09-07 | Discharge: 2020-09-07 | Disposition: A | Discharge status: Home / Self Care | Payer: Medicare Other | Source: Ambulatory Visit | Attending: Internal Medicine | Admitting: Internal Medicine

## 2020-09-07 ENCOUNTER — Other Ambulatory Visit: Payer: Self-pay

## 2020-09-07 DIAGNOSIS — C799 Secondary malignant neoplasm of unspecified site: Secondary | ICD-10-CM | POA: Diagnosis not present

## 2020-09-07 DIAGNOSIS — C439 Malignant melanoma of skin, unspecified: Secondary | ICD-10-CM

## 2020-09-07 DIAGNOSIS — I2510 Atherosclerotic heart disease of native coronary artery without angina pectoris: Secondary | ICD-10-CM | POA: Insufficient documentation

## 2020-09-07 DIAGNOSIS — I700 Atherosclerosis of aorta: Secondary | ICD-10-CM | POA: Insufficient documentation

## 2020-09-07 LAB — GLUCOSE, CAPILLARY: Glucose-Capillary: 61 mg/dL — ABNORMAL LOW (ref 70–99)

## 2020-09-07 MED ORDER — FLUDEOXYGLUCOSE F - 18 (FDG) INJECTION
6.9000 | Freq: Once | INTRAVENOUS | Status: AC | PRN
  Administered 2020-09-07: 7.03 via INTRAVENOUS

## 2020-09-07 MED ORDER — FLUDEOXYGLUCOSE F - 18 (FDG) INJECTION: 11.4000 | Freq: Once | INTRAVENOUS | Status: DC | PRN

## 2020-09-08 ENCOUNTER — Encounter: Payer: Self-pay | Admitting: Internal Medicine

## 2020-09-09 ENCOUNTER — Inpatient Hospital Stay: Payer: Medicare Other | Attending: Internal Medicine | Admitting: Internal Medicine

## 2020-09-09 ENCOUNTER — Other Ambulatory Visit: Payer: Self-pay

## 2020-09-09 DIAGNOSIS — R634 Abnormal weight loss: Secondary | ICD-10-CM | POA: Insufficient documentation

## 2020-09-09 DIAGNOSIS — M2550 Pain in unspecified joint: Secondary | ICD-10-CM | POA: Diagnosis not present

## 2020-09-09 DIAGNOSIS — N183 Chronic kidney disease, stage 3 unspecified: Secondary | ICD-10-CM | POA: Diagnosis not present

## 2020-09-09 DIAGNOSIS — R5383 Other fatigue: Secondary | ICD-10-CM | POA: Diagnosis not present

## 2020-09-09 DIAGNOSIS — G8929 Other chronic pain: Secondary | ICD-10-CM | POA: Insufficient documentation

## 2020-09-09 DIAGNOSIS — D631 Anemia in chronic kidney disease: Secondary | ICD-10-CM | POA: Insufficient documentation

## 2020-09-09 DIAGNOSIS — C4371 Malignant melanoma of right lower limb, including hip: Secondary | ICD-10-CM | POA: Diagnosis not present

## 2020-09-09 DIAGNOSIS — Z85820 Personal history of malignant melanoma of skin: Secondary | ICD-10-CM | POA: Diagnosis not present

## 2020-09-09 DIAGNOSIS — R630 Anorexia: Secondary | ICD-10-CM | POA: Insufficient documentation

## 2020-09-09 NOTE — Progress Notes (Signed)
I connected with Patricia Li on 09/09/2020 at  9:15 AM EDT by video enabled telemedicine visit and verified that I am speaking with the correct person using two identifiers.  I discussed the limitations, risks, security and privacy concerns of performing an evaluation and management service by telemedicine and the availability of in-person appointments. I also discussed with the patient that there may be a patient responsible charge related to this service. The patient expressed understanding and agreed to proceed.    Other persons participating in the visit and their role in the encounter: RN/medical reconciliation Patient's location: home Provider's location: office  Oncology History Overview Note  1. 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 TX, Nx, M1 tumor, stage IV. 2. 4 weeks of high in the Intron therapy high dose in February of 2009. 3. Low-dose interferon started from March of 2009  AUG 2012- Right thigh mass [Bx] Recurrent melanoma in the right thigh/ pelvic LN [PETscan]; NEG for BRAF V600E; April 2017 PET scan showed recurrent disease in the right inguinal region which was confirmed by biopsy. Zeboraf discontinued in November 2017 secondary to intolerable rash. Patient initiated combination immunotherapy with nivolumab and ipilumimab on January 14, 2016. Patient initiated maintenance nivolumab on April 14, 2016 and discontinued on November 07, 2016.  Nivolumab was discontinued secondary to persistent diarrhea.  Single agent oral Mekinist from December 07, 2016 through December 2018 [Dr.Finnegan]  # OCT 2012- IPI x 2 cycles [stopped sec to intol- Side effects]  # April 2017- PET scan shows recurrent disease with- RIGHT INGUINAL adenopathy s/p Bx [Dr.Byrnett].June 2017 [OSH]- right external Iliac LN-  RT  # Vitamin B 12 deficiency      Malignant melanoma of skin of lower limb, including hip (HCC)   Initial Diagnosis   Malignant melanoma of skin of lower  limb, including hip [facility])       Chief Complaint: Melanoma/   History of present illness:Patricia Li 60 y.o.  female with history of history of metastatic melanoma stage IV currently NED is here today with results of the PET scan.  Patient denies any headache.  Denies any abdominal pain.  Continues to have chronic back pain joint pains.    Continues to complain of poor appetite.  Complains of weight loss.  No difficulty swallowing no any constipation or diarrhea.  She is currently awaiting GI evaluation for her unintentional weight loss.  Observation/objective: Alert & oriented x 3. In No acute distress.   Assessment and plan: Malignant melanoma of skin of lower limb, including hip ([place]) #Stage IV melanoma-currently NED; July 2022- PET scan- NED.  No suspicion for any progressive disease; however weight loss below  #Chronic back pain -MRI Jan 2022 -NED for any active metastatic disease.  1x1 cm left paraspinous cystic lesion at L4/intermittent back pain stable  # Anemia/CKD-on PO iron- improved Hb 11.3- Improved; continue the same.   # weight loss/dyaphagia-  ~ 10 pounds in last 4-5 months/unintentional. awaiting evaluation GI EGD/colonoscpoy [end of AUG 7902]  #CKD stage III-stable linically.  #Fatigue/Hypothyroidsm [followed by Dr.Solum]- April 2022- T4-WNL.   # DISPOSITION: # cancel CT in OCT 2022 #  Follow up in mid Oct 2022- MD; labs- cbc/cmp-Dr.B  # I reviewed the blood work- with the patient in detail; also reviewed the imaging independently [as summarized above]; and with the patient in detail.      Follow-up instructions:  I discussed the assessment and treatment plan with the patient.  The patient was provided an  opportunity to ask questions and all were answered.  The patient agreed with the plan and demonstrated understanding of instructions.  The patient was advised to call back or seek an in person evaluation if the symptoms worsen or if the condition  fails to improve as anticipated.   Dr. Charlaine Dalton [facility] at [facility] 09/10/2020 11:15 PM

## 2020-09-09 NOTE — Assessment & Plan Note (Addendum)
#  Stage IV melanoma-currently NED; July 2022- PET scan- NED.  No suspicion for any progressive disease; however weight loss below  #Chronic back pain -MRI Jan 2022 -NED for any active metastatic disease.  1x1 cm left paraspinous cystic lesion at L4/intermittent back pain stable  # Anemia/CKD-on PO iron- improved Hb 11.3- Improved; continue the same.   # weight loss/dyaphagia-  ~ 10 pounds in last 4-5 months/unintentional. awaiting evaluation GI EGD/colonoscpoy [end of AUG 5331]  #CKD stage III-stable linically.  #Fatigue/Hypothyroidsm [followed by Dr.Solum]- April 2022- T4-WNL.   # DISPOSITION: # cancel CT in OCT 2022 #  Follow up in mid Oct 2022- MD; labs- cbc/cmp-Dr.B  # I reviewed the blood work- with the patient in detail; also reviewed the imaging independently [as summarized above]; and with the patient in detail.

## 2020-09-11 ENCOUNTER — Other Ambulatory Visit: Payer: Self-pay

## 2020-09-11 ENCOUNTER — Ambulatory Visit
Admission: RE | Admit: 2020-09-11 | Discharge: 2020-09-11 | Disposition: A | Discharge status: Home / Self Care | Payer: Medicare Other | Source: Ambulatory Visit | Attending: Internal Medicine | Admitting: Internal Medicine

## 2020-09-11 DIAGNOSIS — Z1231 Encounter for screening mammogram for malignant neoplasm of breast: Secondary | ICD-10-CM | POA: Insufficient documentation

## 2020-12-07 ENCOUNTER — Ambulatory Visit: Payer: Medicare Other

## 2020-12-08 ENCOUNTER — Telehealth: Payer: Medicare Other | Admitting: Internal Medicine

## 2020-12-15 ENCOUNTER — Other Ambulatory Visit: Payer: Self-pay

## 2020-12-15 DIAGNOSIS — Z1231 Encounter for screening mammogram for malignant neoplasm of breast: Secondary | ICD-10-CM

## 2020-12-15 DIAGNOSIS — C439 Malignant melanoma of skin, unspecified: Secondary | ICD-10-CM

## 2020-12-16 ENCOUNTER — Inpatient Hospital Stay: Payer: Medicare Other | Attending: Internal Medicine

## 2020-12-16 ENCOUNTER — Inpatient Hospital Stay: Payer: Medicare Other | Admitting: Internal Medicine

## 2020-12-16 ENCOUNTER — Other Ambulatory Visit: Payer: Self-pay

## 2020-12-16 VITALS — BP 119/71 | HR 85 | Temp 98.2°F | Resp 18 | Wt 128.0 lb

## 2020-12-16 DIAGNOSIS — M810 Age-related osteoporosis without current pathological fracture: Secondary | ICD-10-CM | POA: Insufficient documentation

## 2020-12-16 DIAGNOSIS — I429 Cardiomyopathy, unspecified: Secondary | ICD-10-CM | POA: Diagnosis not present

## 2020-12-16 DIAGNOSIS — Z79899 Other long term (current) drug therapy: Secondary | ICD-10-CM | POA: Insufficient documentation

## 2020-12-16 DIAGNOSIS — C439 Malignant melanoma of skin, unspecified: Secondary | ICD-10-CM | POA: Diagnosis present

## 2020-12-16 DIAGNOSIS — E2740 Unspecified adrenocortical insufficiency: Secondary | ICD-10-CM | POA: Insufficient documentation

## 2020-12-16 DIAGNOSIS — F17210 Nicotine dependence, cigarettes, uncomplicated: Secondary | ICD-10-CM | POA: Diagnosis not present

## 2020-12-16 DIAGNOSIS — K219 Gastro-esophageal reflux disease without esophagitis: Secondary | ICD-10-CM | POA: Insufficient documentation

## 2020-12-16 DIAGNOSIS — I509 Heart failure, unspecified: Secondary | ICD-10-CM | POA: Diagnosis not present

## 2020-12-16 DIAGNOSIS — N183 Chronic kidney disease, stage 3 unspecified: Secondary | ICD-10-CM | POA: Insufficient documentation

## 2020-12-16 DIAGNOSIS — E039 Hypothyroidism, unspecified: Secondary | ICD-10-CM | POA: Insufficient documentation

## 2020-12-16 DIAGNOSIS — E538 Deficiency of other specified B group vitamins: Secondary | ICD-10-CM | POA: Insufficient documentation

## 2020-12-16 DIAGNOSIS — I130 Hypertensive heart and chronic kidney disease with heart failure and stage 1 through stage 4 chronic kidney disease, or unspecified chronic kidney disease: Secondary | ICD-10-CM | POA: Diagnosis not present

## 2020-12-16 DIAGNOSIS — D631 Anemia in chronic kidney disease: Secondary | ICD-10-CM | POA: Diagnosis not present

## 2020-12-16 DIAGNOSIS — Z8616 Personal history of COVID-19: Secondary | ICD-10-CM | POA: Diagnosis not present

## 2020-12-16 DIAGNOSIS — R5383 Other fatigue: Secondary | ICD-10-CM | POA: Diagnosis not present

## 2020-12-16 DIAGNOSIS — Z1231 Encounter for screening mammogram for malignant neoplasm of breast: Secondary | ICD-10-CM

## 2020-12-16 LAB — CBC WITH DIFFERENTIAL/PLATELET
Abs Immature Granulocytes: 0.17 10*3/uL — ABNORMAL HIGH (ref 0.00–0.07)
Basophils Absolute: 0 10*3/uL (ref 0.0–0.1)
Basophils Relative: 0 %
Eosinophils Absolute: 0.3 10*3/uL (ref 0.0–0.5)
Eosinophils Relative: 3 %
HCT: 34.6 % — ABNORMAL LOW (ref 36.0–46.0)
Hemoglobin: 11.5 g/dL — ABNORMAL LOW (ref 12.0–15.0)
Immature Granulocytes: 2 %
Lymphocytes Relative: 25 %
Lymphs Abs: 2.6 10*3/uL (ref 0.7–4.0)
MCH: 31.2 pg (ref 26.0–34.0)
MCHC: 33.2 g/dL (ref 30.0–36.0)
MCV: 93.8 fL (ref 80.0–100.0)
Monocytes Absolute: 0.8 10*3/uL (ref 0.1–1.0)
Monocytes Relative: 8 %
Neutro Abs: 6.3 10*3/uL (ref 1.7–7.7)
Neutrophils Relative %: 62 %
Platelets: 251 10*3/uL (ref 150–400)
RBC: 3.69 MIL/uL — ABNORMAL LOW (ref 3.87–5.11)
RDW: 13.6 % (ref 11.5–15.5)
WBC: 10.2 10*3/uL (ref 4.0–10.5)
nRBC: 0 % (ref 0.0–0.2)

## 2020-12-16 LAB — COMPREHENSIVE METABOLIC PANEL
ALT: 18 U/L (ref 0–44)
AST: 21 U/L (ref 15–41)
Albumin: 3.8 g/dL (ref 3.5–5.0)
Alkaline Phosphatase: 81 U/L (ref 38–126)
Anion gap: 5 (ref 5–15)
BUN: 18 mg/dL (ref 6–20)
CO2: 27 mmol/L (ref 22–32)
Calcium: 8.7 mg/dL — ABNORMAL LOW (ref 8.9–10.3)
Chloride: 101 mmol/L (ref 98–111)
Creatinine, Ser: 1.8 mg/dL — ABNORMAL HIGH (ref 0.44–1.00)
GFR, Estimated: 32 mL/min — ABNORMAL LOW (ref >60)
Glucose, Bld: 90 mg/dL (ref 70–99)
Potassium: 3.7 mmol/L (ref 3.5–5.1)
Sodium: 133 mmol/L — ABNORMAL LOW (ref 135–145)
Total Bilirubin: 0.4 mg/dL (ref 0.3–1.2)
Total Protein: 6.5 g/dL (ref 6.5–8.1)

## 2020-12-16 NOTE — Assessment & Plan Note (Addendum)
#  Stage IV melanoma-currently NED; July 2022- PET scan- NED.  No suspicion for any progressive disease; however weight loss below.  Clinical evidence of progression reviewed the chest x-ray-[facility] October 2022-negative for any malignancy.  # Chronic back pain -MRI Jan 2022 -NED for any active metastatic disease.  1x1 cm left paraspinous cystic lesion at L4/intermittent back pain-stable.   # Anemia/CKD-on PO iron- improved Hb 11.3- STABLE;  continue the same.   # weight loss/dyaphagia- [[facility]] August 2022 GI EGD- NEGATIVE for malignancy; s/p stretching.  Stable.  # MVVKP-22 [oct 4497]- s/p remdesevir+ dex- [hopsital]-s/p vaccinations.  #CKD stage III- Creatinine- 1.80- [Dr.Lateef]-STABLE.   #Fatigue/Hypothyroidsm [followed by Dr.Solum]- April 2022- T4-WNL. # DISPOSITION: #  Follow up in 4 months- MD; labs- cbc/cmp/LDH-Dr.B

## 2020-12-16 NOTE — Progress Notes (Signed)
Patricia Li NOTE  Patient Care Team: Martie Lee, MD as PCP - General (Family Medicine) Cammie Sickle, MD as Consulting Physician (Internal Medicine) Efrain Sella, MD as Consulting Physician (Gastroenterology) Bary Castilla Forest Gleason, MD as Consulting Physician (General Surgery)  CHIEF COMPLAINTS/PURPOSE OF CONSULTATION: Metastatic melanoma  #  Oncology History Overview Note  1. 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 TX, Nx, M1 tumor, stage IV. 2. 4 weeks of high in the Intron therapy high dose in February of 2009. 3. Low-dose interferon started from March of 2009  AUG 2012- Right thigh mass [Bx] Recurrent melanoma in the right thigh/ pelvic LN [PETscan]; NEG for BRAF V600E; April 2017 PET scan showed recurrent disease in the right inguinal region which was confirmed by biopsy. Zeboraf discontinued in November 2017 secondary to intolerable rash. Patient initiated combination immunotherapy with nivolumab and ipilumimab on January 14, 2016. Patient initiated maintenance nivolumab on April 14, 2016 and discontinued on November 07, 2016.  Nivolumab was discontinued secondary to persistent diarrhea.  s/p recmicade- Single agent oral Mekinist from December 07, 2016 through December 2018 [Dr.Finnegan]  # s/p remicaide  # OCT 2012- IPI x 2 cycles [stopped sec to intol- Side effects]  # April 2017- PET scan shows recurrent disease with- RIGHT INGUINAL adenopathy s/p Bx [Dr.Byrnett].June 2017 [OSH]- right external Iliac LN-  RT- IPI+NIVO .Marland Kitchen..   # Vitamin B 12 deficiency      Malignant melanoma of skin of lower limb, including hip (HCC)   Initial Diagnosis   Malignant melanoma of skin of lower limb, including hip (HCC)      HISTORY OF PRESENTING ILLNESS: Alone.  Ambulating independently.  Patricia Li 60 y.o.  female history of metastatic melanoma currently NED; CKD stage III; anemia is here for follow-up.  In the interim patient  was admitted to the hospital with COVID/[facility]-needing remdesivir/dexamethasone.  No ICU admission.  Patient has recovered fairly well.  Complains of ongoing fatigue.  Patient denies any blood in stools or black-colored stools.  No nausea no vomiting.  Chronic fatigue.  Chronic back pain not any worse.  Review of Systems  Constitutional:  Positive for malaise/fatigue. Negative for chills, diaphoresis, fever and weight loss.  HENT:  Negative for nosebleeds and sore throat.   Eyes:  Negative for double vision.  Respiratory:  Negative for cough, hemoptysis, sputum production, shortness of breath and wheezing.   Cardiovascular:  Negative for chest pain, palpitations, orthopnea and leg swelling.  Gastrointestinal:  Negative for abdominal pain, blood in stool, constipation, diarrhea, heartburn, melena, nausea and vomiting.  Genitourinary:  Negative for dysuria, frequency and urgency.  Musculoskeletal:  Positive for back pain and joint pain.  Skin: Negative.  Negative for itching and rash.  Neurological:  Negative for dizziness, tingling, focal weakness, weakness and headaches.  Endo/Heme/Allergies:  Does not bruise/bleed easily.  Psychiatric/Behavioral:  Negative for depression. The patient is not nervous/anxious and does not have insomnia.     MEDICAL HISTORY:  Past Medical History:  Diagnosis Date   Adrenal insufficiency ([place])    Bipolar disorder ([place])    Cardiomyopathy ([place])    CHF (congestive heart failure) ([place]) 2016   Dr. Nehemiah Massed   Depression    GERD (gastroesophageal reflux disease) 2012   Hypertension    Hypothyroidism    Joint pain    Lumbar herniated disc 2010   Malignant melanoma of skin of lower limb, including hip ([place]) 2008, 2012, 2017   right thigh/2017- stage 4  Osteoporosis, postmenopausal    Overactive bladder    Pancreatitis    Panhypopituitarism ([place])    Personal history of chemotherapy 2008   melonoma   Personal history of malignant melanoma of  skin 2012   Personal history of radiation therapy 2017   melonoma   Rectal bleeding    Renal insufficiency    Supraglottitis    Tendonitis    Tick bite of head 07/20/2015   Weight loss, unintentional     SURGICAL HISTORY: Past Surgical History:  Procedure Laterality Date   BACK SURGERY  2010   BREAST CYST EXCISION  1985   COLONOSCOPY  07/2012   [facility]   COLONOSCOPY WITH PROPOFOL N/A 11/21/2016   Procedure: COLONOSCOPY WITH PROPOFOL;  Surgeon: Manya Silvas, MD;  Location: [facility] ENDOSCOPY;  Service: Endoscopy;  Laterality: N/A;   ESOPHAGOGASTRODUODENOSCOPY     ESOPHAGOGASTRODUODENOSCOPY (EGD) WITH PROPOFOL N/A 11/21/2016   Procedure: ESOPHAGOGASTRODUODENOSCOPY (EGD) WITH PROPOFOL;  Surgeon: Manya Silvas, MD;  Location: [facility] ENDOSCOPY;  Service: Endoscopy;  Laterality: N/A;   FRACTURE SURGERY     rod removed from left femur 07/21/2014   INGUINAL LYMPH NODE BIOPSY Right 07/17/2015   Procedure: INGUINAL LYMPH NODE BIOPSY/EXCISION;  Surgeon: Robert Bellow, MD;  Location: [facility] ORS;  Service: General;  Laterality: Right;   laminnotomy reexploration posterior lumbar   12/26/2008   with nerve decomp and discectomy   MELANOMA EXCISION  2008   right thigh   PORTACATH PLACEMENT Left 03/14/2016   Procedure: INSERTION PORT-A-CATH;  Surgeon: Robert Bellow, MD;  Location: [facility] ORS;  Service: General;  Laterality: Left;   SKIN LESION EXCISION Right 12-19-10   posterior   SPINE SURGERY      SOCIAL HISTORY: Social History   Socioeconomic History   Marital status: Divorced    Spouse name: Not on file   Number of children: Not on file   Years of education: Not on file   Highest education level: Not on file  Occupational History   Not on file  Tobacco Use   Smoking status: Every Day    Packs/day: 0.50    Years: 20.00    Pack years: 10.00    Types: Cigarettes   Smokeless tobacco: Never  Vaping Use   Vaping Use: Never used  Substance and Sexual Activity   Alcohol use: Yes     Alcohol/week: 0.0 standard drinks    Comment: ocassionally   Drug use: No   Sexual activity: Not on file  Other Topics Concern   Not on file  Social History Narrative   Not on file   Social Determinants of Health   Financial Resource Strain: Not on file  Food Insecurity: Not on file  Transportation Needs: Not on file  Physical Activity: Not on file  Stress: Not on file  Social Connections: Not on file  Intimate Partner Violence: Not on file    FAMILY HISTORY: Family History  Problem Relation Age of Onset   COPD Father    Cancer Sister        Colon, sister   Breast cancer Neg Hx     ALLERGIES:  is allergic to clindamycin, levaquin [levofloxacin], aripiprazole, depakote [divalproex sodium], valproic acid, ondansetron hcl, and promethazine.  MEDICATIONS:  Current Outpatient Medications  Medication Sig Dispense Refill   calcitRIOL (ROCALTROL) 0.25 MCG capsule Take 0.25 mcg by mouth daily.  6   Cholecalciferol (VITAMIN D3) 1.25 MG (50000 UT) CAPS Take 1 capsule by mouth once a week.  clonazePAM (KLONOPIN) 0.5 MG tablet Take 0.5 mg by mouth at bedtime.     Cobalamin Combinations (B-12) [phone] MCG SUBL      ferrous sulfate 325 (65 FE) MG tablet Take by mouth.     HYDROcodone-acetaminophen (NORCO/VICODIN) 5-325 MG tablet Take 1 tablet by mouth every 4 (four) hours as needed.     hydrocortisone (CORTEF) 10 MG tablet Take 10-15 mg by mouth 2 (two) times daily. 1.5 tablets (15 MG) in the morning and 1 tablet (10 MG) in the evening     levothyroxine (SYNTHROID) 150 MCG tablet Take 150 mcg by mouth daily.     lubiprostone (AMITIZA) 24 MCG capsule Take 24 mcg by mouth 2 (two) times daily.     memantine (NAMENDA) 10 MG tablet Take 10 mg by mouth daily.     pantoprazole (PROTONIX) 40 MG tablet Take 1 tablet (40 mg total) by mouth 2 (two) times daily. 180 tablet 1   pregabalin (LYRICA) 50 MG capsule Take 25 mg by mouth 3 (three) times daily.     propranolol ER (INDERAL LA) 60 MG  24 hr capsule Take 60 mg by mouth daily.     QUEtiapine (SEROQUEL) 50 MG tablet Take by mouth.     rosuvastatin (CRESTOR) 10 MG tablet Take 10 mg by mouth at bedtime.     venlafaxine XR (EFFEXOR-XR) 75 MG 24 hr capsule Take 150 mg by mouth 2 (two) times daily.     levothyroxine (SYNTHROID) 125 MCG tablet Take 125 mcg by mouth daily. (Patient not taking: Reported on 12/16/2020)     Metoclopramide HCl (REGLAN PO) Take by mouth. (Patient not taking: No sig reported)     Multiple Vitamin (MULTI-VITAMIN) tablet Take 1 tablet by mouth 1 day or 1 dose. (Patient not taking: No sig reported)     nortriptyline (PAMELOR) 10 MG capsule Take 10 mg by mouth at bedtime. (Patient not taking: No sig reported)     No current facility-administered medications for this visit.      Marland Kitchen  PHYSICAL EXAMINATION: ECOG PERFORMANCE STATUS: 0 - Asymptomatic  Vitals:   12/16/20 1105  BP: 119/71  Pulse: 85  Resp: 18  Temp: 98.2 F (36.8 C)  SpO2: 98%   Filed Weights   12/16/20 1105  Weight: 128 lb (58.1 kg)    Physical Exam HENT:     Head: Normocephalic and atraumatic.     Mouth/Throat:     Pharynx: No oropharyngeal exudate.  Eyes:     Pupils: Pupils are equal, round, and reactive to light.  Cardiovascular:     Rate and Rhythm: Normal rate and regular rhythm.  Pulmonary:     Effort: Pulmonary effort is normal. No respiratory distress.     Breath sounds: Normal breath sounds. No wheezing.  Abdominal:     General: Bowel sounds are normal. There is no distension.     Palpations: Abdomen is soft. There is no mass.     Tenderness: There is no abdominal tenderness. There is no guarding or rebound.  Musculoskeletal:        General: No tenderness. Normal range of motion.     Cervical back: Normal range of motion and neck supple.     Comments: At L4 level -1 x 1 cm left paraspinal/cystic lesion-nontender.  Skin:    General: Skin is warm.  Neurological:     Mental Status: She is alert and oriented to  person, place, and time.  Psychiatric:  Mood and Affect: Affect normal.   LABORATORY DATA:  I have reviewed the data as listed Lab Results  Component Value Date   WBC 10.2 12/16/2020   HGB 11.5 (L) 12/16/2020   HCT 34.6 (L) 12/16/2020   MCV 93.8 12/16/2020   PLT 251 12/16/2020   Recent Labs    02/25/20 0850 06/01/20 1037 08/05/20 1448 12/16/20 1011  NA 133*  --  135 133*  K 4.1  --  4.8 3.7  CL 97*  --  100 101  CO2 29  --  26 27  GLUCOSE 101*  --  168* 90  BUN 11  --  16 18  CREATININE 1.48* 1.70* 1.86* 1.80*  CALCIUM 9.1  --  9.4 8.7*  GFRNONAA 41*  --  31* 32*  PROT 7.0  --  6.4* 6.5  ALBUMIN 4.1  --  4.0 3.8  AST 29  --  37 21  ALT 16  --  20 18  ALKPHOS 67  --  50 81  BILITOT 0.6  --  0.7 0.4    RADIOGRAPHIC STUDIES: I have personally reviewed the radiological images as listed and agreed with the findings in the report. No results found.  ASSESSMENT & PLAN:   Malignant melanoma of skin of lower limb, including hip ([place]) #Stage IV melanoma-currently NED; July 2022- PET scan- NED.  No suspicion for any progressive disease; however weight loss below.  Clinical evidence of progression reviewed the chest x-ray-[facility] October 2022-negative for any malignancy.  # Chronic back pain -MRI Jan 2022 -NED for any active metastatic disease.  1x1 cm left paraspinous cystic lesion at L4/intermittent back pain-stable.   # Anemia/CKD-on PO iron- improved Hb 11.3- STABLE;  continue the same.   # weight loss/dyaphagia- [[facility]] August 2022 GI EGD- NEGATIVE for malignancy; s/p stretching.  Stable.  # NGXEX-59 [oct 7331]- s/p remdesevir+ dex- [hopsital]-s/p vaccinations.  #CKD stage III- Creatinine- 1.80- [Dr.Lateef]-STABLE.   #Fatigue/Hypothyroidsm [followed by Dr.Solum]- April 2022- T4-WNL. # DISPOSITION: #  Follow up in 4 months- MD; labs- cbc/cmp/LDH-Dr.B      All questions were answered. The patient knows to call the clinic with any problems, questions or  concerns.    Cammie Sickle, MD 12/16/2020 4:20 PM

## 2021-01-04 ENCOUNTER — Encounter: Payer: Self-pay | Admitting: Internal Medicine

## 2021-02-20 ENCOUNTER — Other Ambulatory Visit: Payer: Self-pay | Admitting: Oncology

## 2021-02-24 ENCOUNTER — Encounter: Payer: Self-pay | Admitting: Oncology

## 2021-03-12 ENCOUNTER — Encounter: Payer: Self-pay | Admitting: Internal Medicine

## 2021-04-21 ENCOUNTER — Encounter: Payer: Self-pay | Admitting: Internal Medicine

## 2021-04-21 ENCOUNTER — Other Ambulatory Visit: Payer: Self-pay

## 2021-04-21 ENCOUNTER — Inpatient Hospital Stay: Payer: Medicare Other | Attending: Internal Medicine

## 2021-04-21 ENCOUNTER — Inpatient Hospital Stay: Payer: Medicare Other | Admitting: Internal Medicine

## 2021-04-21 VITALS — BP 130/72 | HR 85 | Temp 99.1°F | Resp 18 | Wt 143.6 lb

## 2021-04-21 DIAGNOSIS — Z87891 Personal history of nicotine dependence: Secondary | ICD-10-CM | POA: Diagnosis not present

## 2021-04-21 DIAGNOSIS — R634 Abnormal weight loss: Secondary | ICD-10-CM | POA: Diagnosis not present

## 2021-04-21 DIAGNOSIS — Z800 Family history of malignant neoplasm of digestive organs: Secondary | ICD-10-CM | POA: Diagnosis not present

## 2021-04-21 DIAGNOSIS — M549 Dorsalgia, unspecified: Secondary | ICD-10-CM | POA: Diagnosis not present

## 2021-04-21 DIAGNOSIS — I509 Heart failure, unspecified: Secondary | ICD-10-CM | POA: Insufficient documentation

## 2021-04-21 DIAGNOSIS — I130 Hypertensive heart and chronic kidney disease with heart failure and stage 1 through stage 4 chronic kidney disease, or unspecified chronic kidney disease: Secondary | ICD-10-CM | POA: Insufficient documentation

## 2021-04-21 DIAGNOSIS — C439 Malignant melanoma of skin, unspecified: Secondary | ICD-10-CM

## 2021-04-21 DIAGNOSIS — N183 Chronic kidney disease, stage 3 unspecified: Secondary | ICD-10-CM | POA: Diagnosis not present

## 2021-04-21 DIAGNOSIS — C4371 Malignant melanoma of right lower limb, including hip: Secondary | ICD-10-CM | POA: Insufficient documentation

## 2021-04-21 DIAGNOSIS — E538 Deficiency of other specified B group vitamins: Secondary | ICD-10-CM | POA: Insufficient documentation

## 2021-04-21 LAB — CBC WITH DIFFERENTIAL/PLATELET
Abs Immature Granulocytes: 0.02 10*3/uL (ref 0.00–0.07)
Basophils Absolute: 0.1 10*3/uL (ref 0.0–0.1)
Basophils Relative: 1 %
Eosinophils Absolute: 0.2 10*3/uL (ref 0.0–0.5)
Eosinophils Relative: 3 %
HCT: 38.3 % (ref 36.0–46.0)
Hemoglobin: 12.5 g/dL (ref 12.0–15.0)
Immature Granulocytes: 0 %
Lymphocytes Relative: 28 %
Lymphs Abs: 1.8 10*3/uL (ref 0.7–4.0)
MCH: 31.3 pg (ref 26.0–34.0)
MCHC: 32.6 g/dL (ref 30.0–36.0)
MCV: 96 fL (ref 80.0–100.0)
Monocytes Absolute: 0.5 10*3/uL (ref 0.1–1.0)
Monocytes Relative: 9 %
Neutro Abs: 3.7 10*3/uL (ref 1.7–7.7)
Neutrophils Relative %: 59 %
Platelets: 253 10*3/uL (ref 150–400)
RBC: 3.99 MIL/uL (ref 3.87–5.11)
RDW: 13.7 % (ref 11.5–15.5)
WBC: 6.2 10*3/uL (ref 4.0–10.5)
nRBC: 0 % (ref 0.0–0.2)

## 2021-04-21 LAB — COMPREHENSIVE METABOLIC PANEL
ALT: 27 U/L (ref 0–44)
AST: 30 U/L (ref 15–41)
Albumin: 4 g/dL (ref 3.5–5.0)
Alkaline Phosphatase: 47 U/L (ref 38–126)
Anion gap: 6 (ref 5–15)
BUN: 19 mg/dL (ref 6–20)
CO2: 31 mmol/L (ref 22–32)
Calcium: 9 mg/dL (ref 8.9–10.3)
Chloride: 100 mmol/L (ref 98–111)
Creatinine, Ser: 1.54 mg/dL — ABNORMAL HIGH (ref 0.44–1.00)
GFR, Estimated: 39 mL/min — ABNORMAL LOW (ref >60)
Glucose, Bld: 75 mg/dL (ref 70–99)
Potassium: 3.8 mmol/L (ref 3.5–5.1)
Sodium: 137 mmol/L (ref 135–145)
Total Bilirubin: 0.4 mg/dL (ref 0.3–1.2)
Total Protein: 6.4 g/dL — ABNORMAL LOW (ref 6.5–8.1)

## 2021-04-21 LAB — LACTATE DEHYDROGENASE: LDH: 148 U/L (ref 98–192)

## 2021-04-21 NOTE — Progress Notes (Signed)
[place] NOTE  Patient Care Team: Martie Lee, MD as PCP - General (Family Medicine) Cammie Sickle, MD as Consulting Physician (Internal Medicine) Efrain Sella, MD as Consulting Physician (Gastroenterology) Bary Castilla Forest Gleason, MD as Consulting Physician (General Surgery)  CHIEF COMPLAINTS/PURPOSE OF CONSULTATION: Metastatic melanoma  #  Oncology History Overview Note  1. 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 TX, Nx, M1 tumor, stage IV. 2. 4 weeks of high in the Intron therapy high dose in February of 2009. 3. Low-dose interferon started from March of 2009  AUG 2012- Right thigh mass [Bx] Recurrent melanoma in the right thigh/ pelvic LN [PETscan]; NEG for BRAF V600E; April 2017 PET scan showed recurrent disease in the right inguinal region which was confirmed by biopsy. Zeboraf discontinued in November 2017 secondary to intolerable rash. Patient initiated combination immunotherapy with nivolumab and ipilumimab on January 14, 2016. Patient initiated maintenance nivolumab on April 14, 2016 and discontinued on November 07, 2016.  Nivolumab was discontinued secondary to persistent diarrhea.  s/p recmicade- Single agent oral [facility]ist from December 07, 2016 through December 2018 [Dr.Finnegan]  # s/p remicaide  # OCT 2012- IPI x 2 cycles [stopped sec to intol- Side effects]  # April 2017- PET scan shows recurrent disease with- RIGHT INGUINAL adenopathy s/p Bx [Dr.Byrnett].June 2017 [OSH]- right external Iliac LN-  RT- IPI+NIVO .  # Vitamin B 12 deficiency      Malignant melanoma of skin of lower limb, including hip (HCC)   Initial Diagnosis   Malignant melanoma of skin of lower limb, including hip (HCC)      HISTORY OF PRESENTING ILLNESS: Alone.  Ambulating independently.  Cyrstal T Mancel Bale 61 y.o.  female history of metastatic melanoma currently NED; CKD stage III; anemia is here for follow-up.  Patient has quit smoking.   Her to have gained weight.  Chronic back pain.  Patient had MRI in [place] lower back s/p injections.  Chronic mild fatigue not any worse.  Review of Systems  Constitutional:  Positive for malaise/fatigue. Negative for chills, diaphoresis, fever and weight loss.  HENT:  Negative for nosebleeds and sore throat.   Eyes:  Negative for double vision.  Respiratory:  Negative for cough, hemoptysis, sputum production, shortness of breath and wheezing.   Cardiovascular:  Negative for chest pain, palpitations, orthopnea and leg swelling.  Gastrointestinal:  Negative for abdominal pain, blood in stool, constipation, diarrhea, heartburn, melena, nausea and vomiting.  Genitourinary:  Negative for dysuria, frequency and urgency.  Musculoskeletal:  Positive for back pain and joint pain.  Skin: Negative.  Negative for itching and rash.  Neurological:  Negative for dizziness, tingling, focal weakness, weakness and headaches.  Endo/Heme/Allergies:  Does not bruise/bleed easily.  Psychiatric/Behavioral:  Negative for depression. The patient is not nervous/anxious and does not have insomnia.     MEDICAL HISTORY:  Past Medical History:  Diagnosis Date   Adrenal insufficiency ([place])    Bipolar disorder ([place])    Cardiomyopathy ([place])    CHF (congestive heart failure) ([place]) 2016   Dr. Nehemiah Massed   Depression    GERD (gastroesophageal reflux disease) 2012   Hypertension    Hypothyroidism    Joint pain    Lumbar herniated disc 2010   Malignant melanoma of skin of lower limb, including hip ([place]) 2008, 2012, 2017   right thigh/2017- stage 4   Osteoporosis, postmenopausal    Overactive bladder    Pancreatitis    Panhypopituitarism ([place])    Personal history of  chemotherapy 2008   melonoma   Personal history of malignant melanoma of skin 2012   Personal history of radiation therapy 2017   melonoma   Rectal bleeding    Renal insufficiency    Supraglottitis    Tendonitis    Tick bite of head 07/20/2015    Weight loss, unintentional     SURGICAL HISTORY: Past Surgical History:  Procedure Laterality Date   BACK SURGERY  2010   BREAST CYST EXCISION  1985   COLONOSCOPY  07/2012   [facility]   COLONOSCOPY WITH PROPOFOL N/A 11/21/2016   Procedure: COLONOSCOPY WITH PROPOFOL;  Surgeon: Manya Silvas, MD;  [facility] END[facility]COPY;  Service: Endoscopy;  Laterality: N/A;   ESOPHAGOGASTRODUODENOSCOPY     ESOPHAGOGASTRODUODENOSCOPY (EGD) WITH PROPOFOL N/A 11/21/2016   Procedure: ESOPHAGOGASTRODUODENOSCOPY (EGD) WITH PROPOFOL;  Surgeon: Manya Silvas, MD;  Location[facility] END[facility]COPY;  Service: Endoscopy;  Laterality: N/A;   FRACTURE SURGERY     rod removed from left femur 07/21/2014   INGUINAL LYMPH NODE BIOPSY Right 07/17/2015   Procedure: INGUINAL LYMPH NODE BIOPSY/EXCISION;  Surgeon: Robert Bellow, MD;  Location: [facility] ORS;  Service: General;  Laterality: Right;   laminnotomy reexploration posterior lumbar   12/26/2008   with nerve decomp and discectomy   MELANOMA EXCISION  2008   right thigh   PORTACATH PLACEMENT Left 03/14/2016   Procedure: INSERTION PORT-A-CATH;  Surgeon: Robert Bellow, MD;  Location: [facility] ORS;  Service: General;  Laterality: Left;   SKIN LESION EXCISION Right 12-19-10   posterior   SPINE SURGERY      SOCIAL HISTORY: Social History   Socioeconomic History   Marital status: Divorced    Spouse name: Not on file   Number of children: Not on file   Years of education: Not on file   Highest education level: Not on file  Occupational History   Not on file  Tobacco Use   Smoking status: Every Day    Packs/day: 0.50    Years: 20.00    Pack years: 10.00    Types: Cigarettes   Smokeless tobacco: Never  Vaping Use   Vaping Use: Never used  Substance and Sexual Activity   Alcohol use: Yes    Alcohol/week: 0.0 standard drinks    Comment: ocassionally   Drug use: No   Sexual activity: Not on file  Other Topics Concern   Not on file  Social History Narrative    Not on file   Social Determinants of Health   Financial Resource Strain: Not on file  Food Insecurity: Not on file  Transportation Needs: Not on file  Physical Activity: Not on file  Stress: Not on file  Social Connections: Not on file  Intimate Partner Violence: Not on file    FAMILY HISTORY: Family History  Problem Relation Age of Onset   COPD Father    Cancer Sister        Colon, sister   Breast cancer Neg Hx     ALLERGIES:  is allergic to clindamycin, levaquin [levofloxacin], aripiprazole, depakote [divalproex sodium], valproic acid, ondansetron hcl, and promethazine.  MEDICATIONS:  Current Outpatient Medications  Medication Sig Dispense Refill   calcitRIOL (ROCALTROL) 0.25 MCG capsule Take 0.25 mcg by mouth daily.  6   Cholecalciferol (VITAMIN D3) 1.25 MG (50000 UT) CAPS Take 1 capsule by mouth once a week.     clonazePAM (KLONOPIN) 0.5 MG tablet Take 0.5 mg by mouth at bedtime.     ferrous sulfate 325 (65  FE) MG tablet Take by mouth.     hydrocortisone (CORTEF) 10 MG tablet Take 10-15 mg by mouth 2 (two) times daily. 1.5 tablets (15 MG) in the morning and 1 tablet (10 MG) in the evening     levothyroxine (SYNTHROID) 150 MCG tablet Take 150 mcg by mouth daily.     pantoprazole (PROTONIX) 40 MG tablet TAKE 1 TABLET BY MOUTH  TWICE DAILY 180 tablet 3   pregabalin (LYRICA) 50 MG capsule Take 25 mg by mouth 3 (three) times daily.     QUEtiapine (SEROQUEL) 50 MG tablet Take by mouth.     rosuvastatin (CRESTOR) 10 MG tablet Take 10 mg by mouth at bedtime.     venlafaxine XR (EFFEXOR-XR) 75 MG 24 hr capsule Take 150 mg by mouth 2 (two) times daily.     Cobalamin Combinations (B-12) 239 824 [id number] MCG SUBL  (Patient not taking: Reported on 04/21/2021)     HYDROcodone-acetaminophen (NORCO/VICODIN) 5-325 MG tablet Take 1 tablet by mouth every 4 (four) hours as needed. (Patient not taking: Reported on 04/21/2021)     levothyroxine (SYNTHROID) 125 MCG tablet Take 125 mcg by mouth daily.  (Patient not taking: Reported on 12/16/2020)     lubiprostone (AMITIZA) 24 MCG capsule Take 24 mcg by mouth 2 (two) times daily. (Patient not taking: Reported on 04/21/2021)     memantine (NAMENDA) 10 MG tablet Take 10 mg by mouth daily. (Patient not taking: Reported on 04/21/2021)     Metoclopramide HCl (REGLAN PO) Take by mouth. (Patient not taking: Reported on 08/05/2020)     Multiple Vitamin (MULTI-VITAMIN) tablet Take 1 tablet by mouth 1 day or 1 dose. (Patient not taking: Reported on 08/05/2020)     nortriptyline (PAMELOR) 10 MG capsule Take 10 mg by mouth at bedtime. (Patient not taking: Reported on 08/05/2020)     propranolol ER (INDERAL LA) 60 MG 24 hr capsule Take 60 mg by mouth daily. (Patient not taking: Reported on 04/21/2021)     No current facility-administered medications for this visit.      Marland Kitchen  PHYSICAL EXAMINATION: ECOG PERFORMANCE STATUS: 0 - Asymptomatic  Vitals:   04/21/21 1000  BP: 130/72  Pulse: 85  Resp: 18  Temp: 99.1 F (37.3 C)   Filed Weights   04/21/21 1000  Weight: 143 lb 9.6 oz (65.1 kg)    Physical Exam HENT:     Head: Normocephalic and atraumatic.     Mouth/Throat:     Pharynx: No oropharyngeal exudate.  Eyes:     Pupils: Pupils are equal, round, and reactive to light.  Cardiovascular:     Rate and Rhythm: Normal rate and regular rhythm.  Pulmonary:     Effort: Pulmonary effort is normal. No respiratory distress.     Breath sounds: Normal breath sounds. No wheezing.  Abdominal:     General: Bowel sounds are normal. There is no distension.     Palpations: Abdomen is soft. There is no mass.     Tenderness: There is no abdominal tenderness. There is no guarding or rebound.  Musculoskeletal:        General: No tenderness. Normal range of motion.     Cervical back: Normal range of motion and neck supple.     Comments: At L4 level -1 x 1 cm left paraspinal/cystic lesion-nontender.  Skin:    General: Skin is warm.  Neurological:     Mental Status:  She is alert and oriented to person, place, and time.  Psychiatric:  Mood and Affect: Affect normal.   LABORATORY DATA:  I have reviewed the data as listed Lab Results  Component Value Date   WBC 6.2 04/21/2021   HGB 12.5 04/21/2021   HCT 38.3 04/21/2021   MCV 96.0 04/21/2021   PLT 253 04/21/2021   Recent Labs    08/05/20 1448 12/16/20 1011 04/21/21 1026  NA 135 133* 137  K 4.8 3.7 3.8  CL 100 101 100  CO2 _0 GLUCOSE 168* 90 75  BUN _1 CREATININE 1.86* 1.80* 1.54*  CALCIUM 9.4 8.7* 9.0  GFRNONAA 31* 32* 39*  PROT 6.4* 6.5 6.4*  ALBUMIN 4.0 3.8 4.0  AST 37 21 30  ALT _2 ALKPHOS 50 81 47  BILITOT 0.7 0.4 0.4    RADIOGRAPHIC STUDIES: I have personally reviewed the radiological images as listed and agreed with the findings in the report. No results found.  ASSESSMENT & PLAN:   Malignant melanoma of skin of lower limb, including hip ([place])        Metastatic melanoma ([place]) #Stage IV melanoma-currently NED; July 2022- PET scan- NED.  No suspicion for any progressive disease; STABLE-  Repeat CT scans in MAY 2023; ordered today  # Chronic back pain -MRI Jan 2022 -NED for any active metastatic disease.  1x1 cm left paraspinous cystic lesion at L4/intermittent back pain-STABLE; await CT scans.   # Anemia/CKD-on PO iron- improved Hb 11-12 STABLE;  continue the same.   # weight loss/dyaphagia- [[facility]] August 2022 GI EGD- NEGATIVE for malignancy; s/p stretching- improved/[quit smoking- Jan 2023] STABLE.   # CKD stage III- Creatinine- 1.80- [Dr.Lateef]-STABLE.  # Screening: Mammo- July 2022; will order at next visit.   #Fatigue/Hypothyroidsm [followed by Dr.Solum]- April 2022- T4-WNL . # DISPOSITION: #  Follow up in 4 months- MD; labs- cbc/cmp/LDH; CT CAP non-contrast- Dr.B  All questions were answered. The patient knows to call the clinic with any problems, questions or concerns.    Cammie Sickle, MD 04/21/2021 1:13 PM

## 2021-04-21 NOTE — Progress Notes (Signed)
Patient is concerned with rapid weight gain of 14 pounds in 2 week time frame.  Has not noticed edema and no change in eating habits.  Is not constipated but bowel movements not as regular.   Had a fall in 10/2020 that did result in injury of  pubic symphysis and nreceived treatment at [facility] surgical.

## 2021-04-21 NOTE — Assessment & Plan Note (Signed)
#  Stage IV melanoma-currently NED; July 2022- PET scan- NED.  No suspicion for any progressive disease; STABLE-  Repeat CT scans in MAY 2023; ordered today  # Chronic back pain -MRI Jan 2022 -NED for any active metastatic disease.  1x1 cm left paraspinous cystic lesion at L4/intermittent back pain-STABLE; await CT scans.   # Anemia/CKD-on PO iron- improved Hb 11-12 STABLE;  continue the same.   # weight loss/dyaphagia- [[facility]] August 2022 GI EGD- NEGATIVE for malignancy; s/p stretching- improved/[quit smoking- Jan 2023] STABLE.   # CKD stage III- Creatinine- 1.80- [Dr.Lateef]-STABLE.  # Screening: Mammo- July 2022; will order at next visit.   #Fatigue/Hypothyroidsm [followed by Dr.Solum]- April 2022- T4-WNL . # DISPOSITION: #  Follow up in 4 months- MD; labs- cbc/cmp/LDH; CT CAP non-contrast- Dr.B

## 2021-05-10 ENCOUNTER — Encounter: Payer: Self-pay | Admitting: Internal Medicine

## 2021-05-11 NOTE — Telephone Encounter (Signed)
Also attempted to call patient to discuss but no answer.   Left a message on voicemail letting her know I would respond to the MyChart message. ?

## 2021-05-12 NOTE — Telephone Encounter (Signed)
Spoke to patient and explained to her that a there would be a better radiology image that would show abnormality in her leg depending on location of her area of concern. She was doing a deep tissue massage on her right leg when she felt something behind her near a ligament.  Patient had a better understanding of the reasons behind different radiology images.  Does follow up with an orthopedic for back issues and says she will speak to her orthopedic about her concern with the right knee. ?

## 2021-05-20 ENCOUNTER — Encounter: Payer: Self-pay | Admitting: Internal Medicine

## 2021-07-19 ENCOUNTER — Ambulatory Visit
Admission: RE | Admit: 2021-07-19 | Discharge: 2021-07-19 | Disposition: A | Discharge status: Home / Self Care | Payer: Medicare Other | Source: Ambulatory Visit | Attending: Internal Medicine | Admitting: Internal Medicine

## 2021-07-19 DIAGNOSIS — C439 Malignant melanoma of skin, unspecified: Secondary | ICD-10-CM | POA: Diagnosis present

## 2021-07-21 ENCOUNTER — Other Ambulatory Visit: Payer: Medicare Other

## 2021-07-21 ENCOUNTER — Ambulatory Visit: Payer: Medicare Other | Admitting: Internal Medicine

## 2021-07-23 ENCOUNTER — Inpatient Hospital Stay: Payer: Medicare Other | Attending: Internal Medicine

## 2021-07-23 ENCOUNTER — Inpatient Hospital Stay: Payer: Medicare Other | Admitting: Internal Medicine

## 2021-07-23 DIAGNOSIS — M549 Dorsalgia, unspecified: Secondary | ICD-10-CM | POA: Insufficient documentation

## 2021-07-23 DIAGNOSIS — G8929 Other chronic pain: Secondary | ICD-10-CM | POA: Insufficient documentation

## 2021-07-23 DIAGNOSIS — C801 Malignant (primary) neoplasm, unspecified: Secondary | ICD-10-CM | POA: Diagnosis not present

## 2021-07-23 DIAGNOSIS — Z800 Family history of malignant neoplasm of digestive organs: Secondary | ICD-10-CM | POA: Insufficient documentation

## 2021-07-23 DIAGNOSIS — D631 Anemia in chronic kidney disease: Secondary | ICD-10-CM | POA: Diagnosis not present

## 2021-07-23 DIAGNOSIS — I130 Hypertensive heart and chronic kidney disease with heart failure and stage 1 through stage 4 chronic kidney disease, or unspecified chronic kidney disease: Secondary | ICD-10-CM | POA: Diagnosis not present

## 2021-07-23 DIAGNOSIS — D649 Anemia, unspecified: Secondary | ICD-10-CM | POA: Insufficient documentation

## 2021-07-23 DIAGNOSIS — C439 Malignant melanoma of skin, unspecified: Secondary | ICD-10-CM | POA: Diagnosis not present

## 2021-07-23 DIAGNOSIS — I509 Heart failure, unspecified: Secondary | ICD-10-CM | POA: Insufficient documentation

## 2021-07-23 DIAGNOSIS — N184 Chronic kidney disease, stage 4 (severe): Secondary | ICD-10-CM | POA: Insufficient documentation

## 2021-07-23 DIAGNOSIS — E538 Deficiency of other specified B group vitamins: Secondary | ICD-10-CM | POA: Insufficient documentation

## 2021-07-23 DIAGNOSIS — Z87891 Personal history of nicotine dependence: Secondary | ICD-10-CM | POA: Insufficient documentation

## 2021-07-23 DIAGNOSIS — C792 Secondary malignant neoplasm of skin: Secondary | ICD-10-CM | POA: Insufficient documentation

## 2021-07-23 LAB — CBC WITH DIFFERENTIAL/PLATELET
Abs Immature Granulocytes: 0.01 10*3/uL (ref 0.00–0.07)
Basophils Absolute: 0.1 10*3/uL (ref 0.0–0.1)
Basophils Relative: 1 %
Eosinophils Absolute: 0.2 10*3/uL (ref 0.0–0.5)
Eosinophils Relative: 3 %
HCT: 34.1 % — ABNORMAL LOW (ref 36.0–46.0)
Hemoglobin: 11.3 g/dL — ABNORMAL LOW (ref 12.0–15.0)
Immature Granulocytes: 0 %
Lymphocytes Relative: 29 %
Lymphs Abs: 1.7 10*3/uL (ref 0.7–4.0)
MCH: 30.7 pg (ref 26.0–34.0)
MCHC: 33.1 g/dL (ref 30.0–36.0)
MCV: 92.7 fL (ref 80.0–100.0)
Monocytes Absolute: 0.3 10*3/uL (ref 0.1–1.0)
Monocytes Relative: 5 %
Neutro Abs: 3.6 10*3/uL (ref 1.7–7.7)
Neutrophils Relative %: 62 %
Platelets: 310 10*3/uL (ref 150–400)
RBC: 3.68 MIL/uL — ABNORMAL LOW (ref 3.87–5.11)
RDW: 11.9 % (ref 11.5–15.5)
WBC: 5.8 10*3/uL (ref 4.0–10.5)
nRBC: 0 % (ref 0.0–0.2)

## 2021-07-23 LAB — COMPREHENSIVE METABOLIC PANEL
ALT: 16 U/L (ref 0–44)
AST: 31 U/L (ref 15–41)
Albumin: 3.9 g/dL (ref 3.5–5.0)
Alkaline Phosphatase: 70 U/L (ref 38–126)
Anion gap: 7 (ref 5–15)
BUN: 13 mg/dL (ref 6–20)
CO2: 26 mmol/L (ref 22–32)
Calcium: 9.2 mg/dL (ref 8.9–10.3)
Chloride: 105 mmol/L (ref 98–111)
Creatinine, Ser: 1.83 mg/dL — ABNORMAL HIGH (ref 0.44–1.00)
GFR, Estimated: 31 mL/min — ABNORMAL LOW (ref >60)
Glucose, Bld: 114 mg/dL — ABNORMAL HIGH (ref 70–99)
Potassium: 4.3 mmol/L (ref 3.5–5.1)
Sodium: 138 mmol/L (ref 135–145)
Total Bilirubin: 0.4 mg/dL (ref 0.3–1.2)
Total Protein: 6.8 g/dL (ref 6.5–8.1)

## 2021-07-23 LAB — LACTATE DEHYDROGENASE: LDH: 147 U/L (ref 98–192)

## 2021-07-23 NOTE — Assessment & Plan Note (Addendum)
#  Stage IV melanoma-currently NED; non IV CT scans in 22nd, MAY 2023-  No evidence of active metastatic disease within the chest, abdomen, or pelvis; however see below  #Left neck 2 x 3 cm mass noted- ?  Lymph node versus others.  Recommend ultrasound soft tissue neck.  Hold off CT contrast because of CKD stage IV  # Chronic back pain -MRI Jan 2022 -NED for any active metastatic disease.  1x1 cm left paraspinous cystic lesion at L4/intermittent back pain-stable.  # Anemia/CKD-on PO iron- improved Hb 11-12   continue the same.  Stable  # CKD stage III-IV Creatinine- 1.80- [Dr.Lateef]-STABLE.  #Fatigue/Hypothyroidsm [followed by Dr.Solum]- April 2022- T4-WNL  #Incidental findings on Imaging MAY  2023: Callus formation about minimally displaced right superior and inferior pubic rami fractures without suspicious underlying osseous Lesion;  Sigmoid colonic diverticulosis without findings of acute diverticulitis;  Retained versus refluxed orally ingested contras;  Aortic Atherosclerosis ( I reviewed/discussed/counseled the patient.   mychart re: [place] # DISPOSITION: # Left neck US- ASAP #  Follow up in 4 months- MD; labs- cbc/cmp/LDH; Dr.B  # I reviewed the blood work- with the patient in detail; also reviewed the imaging independently [as summarized above]; and with the patient in detail.

## 2021-07-23 NOTE — Progress Notes (Signed)
[facility] NOTE  Patient Care Team: Martie Lee, MD as PCP - General (Family Medicine) Cammie Sickle, MD as Consulting Physician (Internal Medicine) Efrain Sella, MD as Consulting Physician (Gastroenterology) Bary Castilla [place] Gleason, MD as Consulting Physician (General Surgery)  CHIEF COMPLAINTS/PURPOSE OF CONSULTATION: Metastatic melanoma  #  Oncology History Overview Note  1. 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 TX, Nx, M1 tumor, stage IV. 2. 4 weeks of high in the Intron therapy high dose in February of 2009. 3. Low-dose interferon started from March of 2009  AUG 2012- Right thigh mass [Bx] Recurrent melanoma in the right thigh/ pelvic LN [PETscan]; NEG for BRAF V600E; April 2017 PET scan showed recurrent disease in the right inguinal region which was confirmed by biopsy. Zeboraf discontinued in November 2017 secondary to intolerable rash. Patient initiated combination immunotherapy with nivolumab and ipilumimab on January 14, 2016. Patient initiated maintenance nivolumab on April 14, 2016 and discontinued on November 07, 2016.  Nivolumab was discontinued secondary to persistent diarrhea.  s/p recmicade- Single agent oral Mekinist from December 07, 2016 through December 2018 [Dr.Finnegan]  # s/p remicaide  # OCT 2012- IPI x 2 cycles [stopped sec to intol- Side effects]  # April 2017- PET scan shows recurrent disease with- RIGHT INGUINAL adenopathy s/p Bx [Dr.Byrnett].June 2017 [OSH]- right external Iliac LN-  RT- IPI+NIVO .  # Vitamin B 12 deficiency      Malignant melanoma of skin of lower limb, including hip (HCC)   Initial Diagnosis   Malignant melanoma of skin of lower limb, including hip (HCC)       HISTORY OF PRESENTING ILLNESS: Alone.  Ambulating independently.  Patricia Li 61 y.o.  female history of metastatic melanoma currently NED; CKD stage III; anemia is here for follow-up/review results of the CT  scan.  S/p back surgery in [place]. Oxycodone caused rash. Back pain improved.  Her pain is overall improved.  Patient has quit smoking.  Her to have gained weight. Chronic mild fatigue not any worse.  Review of Systems  Constitutional:  Positive for malaise/fatigue. Negative for chills, diaphoresis, fever and weight loss.  HENT:  Negative for nosebleeds and sore throat.   Eyes:  Negative for double vision.  Respiratory:  Negative for cough, hemoptysis, sputum production, shortness of breath and wheezing.   Cardiovascular:  Negative for chest pain, palpitations, orthopnea and leg swelling.  Gastrointestinal:  Negative for abdominal pain, blood in stool, constipation, diarrhea, heartburn, melena, nausea and vomiting.  Genitourinary:  Negative for dysuria, frequency and urgency.  Musculoskeletal:  Positive for back pain and joint pain.  Skin: Negative.  Negative for itching and rash.  Neurological:  Negative for dizziness, tingling, focal weakness, weakness and headaches.  Endo/Heme/Allergies:  Does not bruise/bleed easily.  Psychiatric/Behavioral:  Negative for depression. The patient is not nervous/anxious and does not have insomnia.     MEDICAL HISTORY:  Past Medical History:  Diagnosis Date   Adrenal insufficiency ([place])    Bipolar disorder ([place])    Cardiomyopathy ([place])    CHF (congestive heart failure) ([place]) 2016   Dr. 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[facility])    Personal history of chemotherapy 2008   melonoma   Personal history of malignant melanoma of skin 2012   Personal history of radiation therapy 2017   melonoma   Rectal bleeding    Renal insufficiency     Supraglottitis    Tendonitis    Tick bite of head 07/20/2015   Weight loss, unintentional     SURGICAL HISTORY: Past Surgical History:  Procedure Laterality Date   BACK SURGERY  2010   BREAST CYST EXCISION  1985   COLONOSCOPY  07/2012   [facility]   COLONOSCOPY WITH PROPOFOL N/A 11/21/2016   Procedure: COLONOSCOPY WITH PROPOFOL;  Surgeon: Manya Silvas, MD;  Location: [facility] ENDOSCOPY;  Service: Endoscopy;  Laterality: N/A;   ESOPHAGOGASTRODUODENOSCOPY     ESOPHAGOGASTRODUODENOSCOPY (EGD) WITH PROPOFOL N/A 11/21/2016   Procedure: ESOPHAGOGASTRODUODENOSCOPY (EGD) WITH PROPOFOL;  Surgeon: Manya Silvas, MD;  Location: [facility];  Service: Endoscopy;  Laterality: N/A;   FRACTURE SURGERY     rod removed from left femur 07/21/2014   INGUINAL LYMPH NODE BIOPSY Right 07/17/2015   Procedure: INGUINAL LYMPH NODE BIOPSY/EXCISION;  Surgeon: Robert Bellow, MD;  Location: [facility] ORS;  Service: General;  Laterality: Right;   laminnotomy reexploration posterior lumbar   12/26/2008   with nerve decomp and discectomy   MELANOMA EXCISION  2008   right thigh   PORTACATH PLACEMENT Left 03/14/2016   Procedure: INSERTION PORT-A-CATH;  Surgeon: Robert Bellow, MD;  Location: [facility] ORS;  Service: General;  Laterality: Left;   SKIN LESION EXCISION Right 12-19-10   posterior   SPINE SURGERY      SOCIAL HISTORY: Social History   Socioeconomic History   Marital status: Divorced    Spouse name: Not on file   Number of children: Not on file   Years of education: Not on file   Highest education level: Not on file  Occupational History   Not on file  Tobacco Use   Smoking status: Every Day    Packs/day: 0.50    Years: 20.00    Pack years: 10.00    Types: Cigarettes   Smokeless tobacco: Never  Vaping Use   Vaping Use: Never used  Substance and Sexual Activity   Alcohol use: Yes    Alcohol/week: 0.0 standard drinks    Comment: ocassionally   Drug use: No   Sexual activity: Not on file   Other Topics Concern   Not on file  Social History Narrative   Not on file   Social Determinants of Health   Financial Resource Strain: Not on file  Food Insecurity: Not on file  Transportation Needs: Not on file  Physical Activity: Not on file  Stress: Not on file  Social Connections: Not on file  Intimate Partner Violence: Not on file    FAMILY HISTORY: Family History  Problem Relation Age of Onset   COPD Father    Cancer Sister        Colon, sister   Breast cancer Neg Hx     ALLERGIES:  is allergic to clindamycin, levaquin [levofloxacin], aripiprazole, depakote [divalproex sodium], prednisone, valproic acid, ondansetron hcl, oxycodone-acetaminophen, and promethazine.  MEDICATIONS:  Current Outpatient Medications  Medication Sig Dispense Refill   calcitRIOL (ROCALTROL) 0.25 MCG capsule Take 0.25 mcg by mouth daily.  6   Cholecalciferol (VITAMIN D3) 1.25 MG (50000 UT) CAPS Take 1 capsule by mouth once a week.     clonazePAM (KLONOPIN) 0.5 MG tablet Take 0.5 mg by mouth  at bedtime.     ferrous sulfate 325 (65 FE) MG tablet Take by mouth.     hydrocortisone (CORTEF) 10 MG tablet Take 10-15 mg by mouth 2 (two) times daily. 1.5 tablets (15 MG) in the morning and 1 tablet (10 MG) in the evening     levothyroxine (SYNTHROID) 150 MCG tablet Take 150 mcg by mouth daily.     pantoprazole (PROTONIX) 40 MG tablet TAKE 1 TABLET BY MOUTH  TWICE DAILY 180 tablet 3   pregabalin (LYRICA) 50 MG capsule Take 25 mg by mouth 3 (three) times daily.     QUEtiapine (SEROQUEL) 50 MG tablet Take by mouth.     rosuvastatin (CRESTOR) 10 MG tablet Take 10 mg by mouth at bedtime.     venlafaxine XR (EFFEXOR-XR) 75 MG 24 hr capsule Take 150 mg by mouth 2 (two) times daily.     Cobalamin Combinations (B-12) [phone] MCG SUBL  (Patient not taking: Reported on 04/21/2021)     HYDROcodone-acetaminophen (NORCO/VICODIN) 5-325 MG tablet Take 1 tablet by mouth every 4 (four) hours as needed. (Patient not  taking: Reported on 04/21/2021)     levothyroxine (SYNTHROID) 125 MCG tablet Take 125 mcg by mouth daily. (Patient not taking: Reported on 12/16/2020)     lubiprostone (AMITIZA) 24 MCG capsule Take 24 mcg by mouth 2 (two) times daily. (Patient not taking: Reported on 04/21/2021)     memantine (NAMENDA) 10 MG tablet Take 10 mg by mouth daily. (Patient not taking: Reported on 04/21/2021)     Metoclopramide HCl (REGLAN PO) Take by mouth. (Patient not taking: Reported on 08/05/2020)     Multiple Vitamin (MULTI-VITAMIN) tablet Take 1 tablet by mouth 1 day or 1 dose. (Patient not taking: Reported on 08/05/2020)     nortriptyline (PAMELOR) 10 MG capsule Take 10 mg by mouth at bedtime. (Patient not taking: Reported on 08/05/2020)     propranolol ER (INDERAL LA) 60 MG 24 hr capsule Take 60 mg by mouth daily. (Patient not taking: Reported on 04/21/2021)     No current facility-administered medications for this visit.      Marland Kitchen  PHYSICAL EXAMINATION: ECOG PERFORMANCE STATUS: 0 - Asymptomatic  Vitals:   07/23/21 1400  BP: (!) 124/47  Pulse: 82  Resp: 18  Temp: 98.8 F (37.1 C)    Filed Weights   07/23/21 1400  Weight: 146 lb 6.4 oz (66.4 kg)     Physical Exam HENT:     Head: Normocephalic and atraumatic.     Mouth/Throat:     Pharynx: No oropharyngeal exudate.  Eyes:     Pupils: Pupils are equal, round, and reactive to light.  Cardiovascular:     Rate and Rhythm: Normal rate and regular rhythm.  Pulmonary:     Effort: Pulmonary effort is normal. No respiratory distress.     Breath sounds: Normal breath sounds. No wheezing.  Abdominal:     General: Bowel sounds are normal. There is no distension.     Palpations: Abdomen is soft. There is no mass.     Tenderness: There is no abdominal tenderness. There is no guarding or rebound.  Musculoskeletal:        General: No tenderness. Normal range of motion.     Cervical back: Normal range of motion and neck supple.     Comments: At L4 level -1 x 1  cm left paraspinal/cystic lesion-nontender.  Skin:    General: Skin is warm.  Neurological:     Mental Status: She  is alert and oriented to person, place, and time.  Psychiatric:        Mood and Affect: Affect normal.   LABORATORY DATA:  I have reviewed the data as listed Lab Results  Component Value Date   WBC 5.8 07/23/2021   HGB 11.3 (L) 07/23/2021   HCT 34.1 (L) 07/23/2021   MCV 92.7 07/23/2021   PLT 310 07/23/2021   Recent Labs    12/16/20 1011 04/21/21 1026 07/23/21 1401  NA 133* 137 138  K 3.7 3.8 4.3  CL 101 100 105  CO2 27 31 26   GLUCOSE 90 75 114*  BUN 18 19 13   CREATININE 1.80* 1.54* 1.83*  CALCIUM 8.7* 9.0 9.2  GFRNONAA 32* 39* 31*  PROT 6.5 6.4* 6.8  ALBUMIN 3.8 4.0 3.9  AST 21 30 31   ALT 18 27 16   ALKPHOS 81 47 70  BILITOT 0.4 0.4 0.4    RADIOGRAPHIC STUDIES: I have personally reviewed the radiological images as listed and agreed with the findings in the report. CT CHEST ABDOMEN PELVIS WO CONTRAST  Result Date: 07/19/2021 CLINICAL DATA:  History of metastatic melanoma, follow-up. * Tracking Code: BO * EXAM: CT CHEST, ABDOMEN AND PELVIS WITHOUT CONTRAST TECHNIQUE: Multidetector CT imaging of the chest, abdomen and pelvis was performed following the standard protocol without IV contrast. RADIATION DOSE REDUCTION: This exam was performed according to the departmental dose-optimization program which includes automated exposure control, adjustment of the mA and/or kV according to patient size and/or use of iterative reconstruction technique. COMPARISON:  Multiple priors including most recent PET-CT September 07, 2020 FINDINGS: CT CHEST FINDINGS Cardiovascular: Aortic and branch vessel atherosclerosis without abdominal aortic aneurysm. Coronary artery calcifications. Normal size heart. No significant pericardial effusion/thickening. Mediastinum/Nodes: No supraclavicular adenopathy. No discrete thyroid nodule. No pathologically enlarged mediastinal, hilar or axillary  lymph nodes. Retained versus refluxed orally ingested contrast in the esophagus. Lungs/Pleura: No suspicious pulmonary nodules or masses. Mild biapical pleuroparenchymal scarring similar prior. No focal airspace consolidation. No pleural effusion. No pneumothorax. Musculoskeletal: Degenerative endplate changes in the lower cervical and upper thoracic spine appears similar prior. No aggressive lytic or blastic lesion of bone. CT ABDOMEN PELVIS FINDINGS Hepatobiliary: No suspicious hepatic lesion on this noncontrast examination. Gallbladder is unremarkable. No biliary ductal dilation. Pancreas: Similar chronic pancreatic atrophy. No pancreatic ductal dilation or evidence of acute inflammation. Spleen: Splenomegaly or focal splenic lesion. Adrenals/Urinary Tract: Bilateral adrenal glands appear normal. No hydronephrosis. No renal, ureteral or bladder calculi identified. Urinary bladder is unremarkable for degree of distension. Stomach/Bowel: Radiopaque enteric contrast material traverses distal loops of small bowel. Stomach is unremarkable for degree of distension. No pathologic dilation of small or large bowel. No evidence of acute bowel inflammation. The appendix and terminal ileum appear normal. Sigmoid colonic diverticulosis without findings of acute diverticulitis. Vascular/Lymphatic: Aortic atherosclerosis without abdominal aortic aneurysm. No pathologically enlarged abdominal or pelvic lymph nodes. Reproductive: Uterus and bilateral adnexa are unremarkable. Other: No significant abdominopelvic free fluid. Musculoskeletal: No aggressive lytic or blastic lesion of bone. Callus formation about minimally displaced right superior and inferior pubic rami fractures without suspicious osseous lesion identified which appear new from recent PET-CT September 07, 2020 but given the state of healing appear somewhat remote. No underlying osseous lesion to suggest pathologic fracture. Stable subcentimeter sclerotic lesion in the L4  vertebral body favored to reflect a bone island. L4-L5 posterior spinal fusion hardware. Similar intramedullary lucency in the proximal diaphysis of the bilateral femurs. No acute osseous abnormality. IMPRESSION: 1. No evidence  of active metastatic disease within the chest, abdomen, or pelvis. 2. Callus formation about minimally displaced right superior and inferior pubic rami fractures without suspicious underlying osseous lesion identified which appear new from recent PET-CT September 07, 2020 but given the state of healing appear somewhat remote. Suggest correlation with history of trauma. 3. Sigmoid colonic diverticulosis without findings of acute diverticulitis. 4. Retained versus refluxed orally ingested contrast in the esophagus, suggest clinical correlation for gastroesophageal reflux disease. 5.  Aortic Atherosclerosis (ICD10-I70.0). Electronically Signed   By: Dahlia Bailiff M.D.   On: 07/19/2021 15:42    ASSESSMENT & PLAN:   Metastatic melanoma ([place]) #Stage IV melanoma-currently NED; non IV CT scans in 22nd, MAY 2023-  No evidence of active metastatic disease within the chest, abdomen, or pelvis; however see below  #Left neck 2 x 3 cm mass noted- ?  Lymph node versus others.  Recommend ultrasound soft tissue neck.  Hold off CT contrast because of CKD stage IV  # Chronic back pain -MRI Jan 2022 -NED for any active metastatic disease.  1x1 cm left paraspinous cystic lesion at L4/intermittent back pain-stable.  # Anemia/CKD-on PO iron- improved Hb 11-12   continue the same.  Stable  # CKD stage III-IV Creatinine- 1.80- [Dr.Lateef]-STABLE.  #Fatigue/Hypothyroidsm [followed by Dr.Solum]- April 2022- T4-WNL  #Incidental findings on Imaging MAY  2023: Callus formation about minimally displaced right superior and inferior pubic rami fractures without suspicious underlying osseous Lesion;  Sigmoid colonic diverticulosis without findings of acute diverticulitis;  Retained versus refluxed orally  ingested contras;  Aortic Atherosclerosis ( I reviewed/discussed/counseled the patient.   mychart re: [place] # DISPOSITION: # Left neck US- ASAP #  Follow up in 4 months- MD; labs- cbc/cmp/LDH; Dr.B  # I reviewed the blood work- with the patient in detail; also reviewed the imaging independently [as summarized above]; and with the patient in detail.    All questions were answered. The patient knows to call the clinic with any problems, questions or concerns.    Cammie Sickle, MD 07/23/2021 3:27 PM

## 2021-07-23 NOTE — Progress Notes (Signed)
Patient denies new problems/concerns today.   °

## 2021-07-27 ENCOUNTER — Encounter: Payer: Self-pay | Admitting: Internal Medicine

## 2021-07-27 ENCOUNTER — Ambulatory Visit
Admission: RE | Admit: 2021-07-27 | Discharge: 2021-07-27 | Disposition: A | Discharge status: Home / Self Care | Payer: Medicare Other | Source: Ambulatory Visit | Attending: Internal Medicine | Admitting: Internal Medicine

## 2021-07-27 DIAGNOSIS — C439 Malignant melanoma of skin, unspecified: Secondary | ICD-10-CM | POA: Diagnosis present

## 2021-07-28 ENCOUNTER — Other Ambulatory Visit: Payer: Self-pay | Admitting: Internal Medicine

## 2021-07-28 ENCOUNTER — Encounter: Payer: Self-pay | Admitting: Internal Medicine

## 2021-07-28 DIAGNOSIS — C439 Malignant melanoma of skin, unspecified: Secondary | ICD-10-CM

## 2021-07-28 NOTE — Progress Notes (Signed)
I left a voicemail for the patient regarding  the results of the ultrasound. Also sent my chart message.  Will order a follow up ultrasound in three months.

## 2021-07-29 NOTE — Progress Notes (Signed)
Patricia Li, Forwarded the [employer] to you for scheduling details.

## 2021-07-29 NOTE — Telephone Encounter (Signed)
Please schedule [place] as MD recommends and inform pt of appt details.

## 2021-08-04 ENCOUNTER — Other Ambulatory Visit: Payer: Medicare Other

## 2021-08-16 ENCOUNTER — Other Ambulatory Visit: Payer: Self-pay | Admitting: Internal Medicine

## 2021-08-16 DIAGNOSIS — Z1231 Encounter for screening mammogram for malignant neoplasm of breast: Secondary | ICD-10-CM

## 2021-08-28 ENCOUNTER — Encounter: Payer: Self-pay | Admitting: Internal Medicine

## 2021-09-14 ENCOUNTER — Ambulatory Visit
Admission: RE | Admit: 2021-09-14 | Discharge: 2021-09-14 | Disposition: A | Discharge status: Home / Self Care | Payer: Medicare Other | Source: Ambulatory Visit | Attending: Internal Medicine | Admitting: Internal Medicine

## 2021-09-14 DIAGNOSIS — Z1231 Encounter for screening mammogram for malignant neoplasm of breast: Secondary | ICD-10-CM | POA: Insufficient documentation

## 2021-11-15 ENCOUNTER — Other Ambulatory Visit: Payer: Self-pay | Admitting: Physician Assistant

## 2021-11-15 DIAGNOSIS — G8929 Other chronic pain: Secondary | ICD-10-CM

## 2021-11-15 DIAGNOSIS — M2391 Unspecified internal derangement of right knee: Secondary | ICD-10-CM

## 2021-11-16 ENCOUNTER — Encounter: Payer: Self-pay | Admitting: Internal Medicine

## 2021-11-18 ENCOUNTER — Ambulatory Visit
Admission: RE | Admit: 2021-11-18 | Discharge: 2021-11-18 | Disposition: A | Discharge status: Home / Self Care | Payer: Medicare Other | Source: Ambulatory Visit | Attending: Internal Medicine | Admitting: Internal Medicine

## 2021-11-18 DIAGNOSIS — C439 Malignant melanoma of skin, unspecified: Secondary | ICD-10-CM | POA: Diagnosis present

## 2021-11-22 ENCOUNTER — Ambulatory Visit
Admission: RE | Admit: 2021-11-22 | Discharge: 2021-11-22 | Disposition: A | Discharge status: Home / Self Care | Payer: Medicare Other | Source: Ambulatory Visit | Attending: Physician Assistant | Admitting: Physician Assistant

## 2021-11-22 DIAGNOSIS — M2391 Unspecified internal derangement of right knee: Secondary | ICD-10-CM | POA: Diagnosis not present

## 2021-11-22 DIAGNOSIS — M25561 Pain in right knee: Secondary | ICD-10-CM | POA: Diagnosis present

## 2021-11-22 DIAGNOSIS — G8929 Other chronic pain: Secondary | ICD-10-CM | POA: Insufficient documentation

## 2021-11-23 ENCOUNTER — Encounter: Payer: Self-pay | Admitting: Internal Medicine

## 2021-11-23 ENCOUNTER — Inpatient Hospital Stay: Payer: Medicare Other | Admitting: Internal Medicine

## 2021-11-23 ENCOUNTER — Inpatient Hospital Stay: Payer: Medicare Other | Attending: Internal Medicine

## 2021-11-23 DIAGNOSIS — F17210 Nicotine dependence, cigarettes, uncomplicated: Secondary | ICD-10-CM | POA: Insufficient documentation

## 2021-11-23 DIAGNOSIS — C4371 Malignant melanoma of right lower limb, including hip: Secondary | ICD-10-CM | POA: Insufficient documentation

## 2021-11-23 DIAGNOSIS — D631 Anemia in chronic kidney disease: Secondary | ICD-10-CM | POA: Diagnosis not present

## 2021-11-23 DIAGNOSIS — N184 Chronic kidney disease, stage 4 (severe): Secondary | ICD-10-CM | POA: Diagnosis not present

## 2021-11-23 DIAGNOSIS — I509 Heart failure, unspecified: Secondary | ICD-10-CM | POA: Insufficient documentation

## 2021-11-23 DIAGNOSIS — C439 Malignant melanoma of skin, unspecified: Secondary | ICD-10-CM

## 2021-11-23 DIAGNOSIS — I130 Hypertensive heart and chronic kidney disease with heart failure and stage 1 through stage 4 chronic kidney disease, or unspecified chronic kidney disease: Secondary | ICD-10-CM | POA: Diagnosis not present

## 2021-11-23 DIAGNOSIS — Z800 Family history of malignant neoplasm of digestive organs: Secondary | ICD-10-CM | POA: Insufficient documentation

## 2021-11-23 LAB — COMPREHENSIVE METABOLIC PANEL
ALT: 17 U/L (ref 0–44)
AST: 24 U/L (ref 15–41)
Albumin: 4.1 g/dL (ref 3.5–5.0)
Alkaline Phosphatase: 58 U/L (ref 38–126)
Anion gap: 3 — ABNORMAL LOW (ref 5–15)
BUN: 24 mg/dL — ABNORMAL HIGH (ref 6–20)
CO2: 28 mmol/L (ref 22–32)
Calcium: 8.9 mg/dL (ref 8.9–10.3)
Chloride: 106 mmol/L (ref 98–111)
Creatinine, Ser: 1.82 mg/dL — ABNORMAL HIGH (ref 0.44–1.00)
GFR, Estimated: 32 mL/min — ABNORMAL LOW (ref >60)
Glucose, Bld: 114 mg/dL — ABNORMAL HIGH (ref 70–99)
Potassium: 3.9 mmol/L (ref 3.5–5.1)
Sodium: 137 mmol/L (ref 135–145)
Total Bilirubin: 0.3 mg/dL (ref 0.3–1.2)
Total Protein: 6.8 g/dL (ref 6.5–8.1)

## 2021-11-23 LAB — CBC WITH DIFFERENTIAL/PLATELET
Abs Immature Granulocytes: 0.01 10*3/uL (ref 0.00–0.07)
Basophils Absolute: 0.1 10*3/uL (ref 0.0–0.1)
Basophils Relative: 1 %
Eosinophils Absolute: 0.2 10*3/uL (ref 0.0–0.5)
Eosinophils Relative: 4 %
HCT: 35.4 % — ABNORMAL LOW (ref 36.0–46.0)
Hemoglobin: 11.9 g/dL — ABNORMAL LOW (ref 12.0–15.0)
Immature Granulocytes: 0 %
Lymphocytes Relative: 35 %
Lymphs Abs: 2.1 10*3/uL (ref 0.7–4.0)
MCH: 30.1 pg (ref 26.0–34.0)
MCHC: 33.6 g/dL (ref 30.0–36.0)
MCV: 89.6 fL (ref 80.0–100.0)
Monocytes Absolute: 0.4 10*3/uL (ref 0.1–1.0)
Monocytes Relative: 7 %
Neutro Abs: 3.2 10*3/uL (ref 1.7–7.7)
Neutrophils Relative %: 53 %
Platelets: 246 10*3/uL (ref 150–400)
RBC: 3.95 MIL/uL (ref 3.87–5.11)
RDW: 13.6 % (ref 11.5–15.5)
WBC: 5.9 10*3/uL (ref 4.0–10.5)
nRBC: 0 % (ref 0.0–0.2)

## 2021-11-23 LAB — LACTATE DEHYDROGENASE: LDH: 128 U/L (ref 98–192)
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
## 2021-11-23 NOTE — Progress Notes (Signed)
Pt in for follow up, denies any concerns today. 
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
## 2021-11-26 ENCOUNTER — Encounter: Payer: Self-pay | Admitting: Oncology

## 2021-12-22 ENCOUNTER — Other Ambulatory Visit: Payer: Self-pay | Admitting: Surgery

## 2021-12-29 ENCOUNTER — Inpatient Hospital Stay
Admission: RE | Admit: 2021-12-29 | Discharge: 2021-12-29 | Disposition: A | Discharge status: Home / Self Care | Payer: Medicare Other | Source: Ambulatory Visit

## 2021-12-29 HISTORY — DX: Occlusion and stenosis of bilateral carotid arteries: I65.23

## 2021-12-29 HISTORY — DX: Chronic kidney disease, stage 3 unspecified: N18.30

## 2021-12-29 HISTORY — DX: Degenerative disease of nervous system, unspecified: G31.9

## 2021-12-31 ENCOUNTER — Other Ambulatory Visit: Payer: Medicare Other

## 2021-12-31 ENCOUNTER — Other Ambulatory Visit: Payer: Self-pay

## 2021-12-31 ENCOUNTER — Encounter
Admission: RE | Admit: 2021-12-31 | Discharge: 2021-12-31 | Disposition: A | Discharge status: Home / Self Care | Payer: Medicare Other | Source: Ambulatory Visit | Attending: Surgery | Admitting: Surgery

## 2021-12-31 DIAGNOSIS — Z01812 Encounter for preprocedural laboratory examination: Secondary | ICD-10-CM

## 2021-12-31 HISTORY — DX: Pneumonia, unspecified organism: J18.9

## 2021-12-31 HISTORY — DX: Anxiety disorder, unspecified: F41.9

## 2021-12-31 HISTORY — DX: Unspecified osteoarthritis, unspecified site: M19.90

## 2021-12-31 HISTORY — DX: Anemia, unspecified: D64.9

## 2021-12-31 NOTE — Patient Instructions (Signed)
Your procedure is scheduled on: 01/06/22 - Thursday Report to the Registration Desk on the 1st floor of the [facility]. To find out your arrival time, please call [phone] between 1PM - 3PM on: 01/05/22 - Wednesday If your arrival time is 6:00 am, do not arrive prior to that time as the [facility] entrance doors do not open until 6:00 am.  REMEMBER: Instructions that are not followed completely may result in serious medical risk, up to and including death; or upon the discretion of your surgeon and anesthesiologist your surgery may need to be rescheduled.  Do not eat food after midnight the night before surgery.  No gum chewing, lozengers or hard candies.  You may however, drink CLEAR liquids up to 2 hours before you are scheduled to arrive for your surgery. Do not drink anything within 2 hours of your scheduled arrival time.  Clear liquids include: - water  - apple juice without pulp - gatorade (not RED colors) - black coffee or tea (Do NOT add milk or creamers to the coffee or tea) Do NOT drink anything that is not on this list.  In addition, your doctor has ordered for you to drink the provided  Ensure Pre-Surgery Clear Carbohydrate Drink  Drinking this carbohydrate drink up to two hours before surgery helps to reduce insulin resistance and improve patient outcomes. Please complete drinking 2 hours prior to scheduled arrival time.  TAKE THESE MEDICATIONS THE MORNING OF SURGERY WITH A SIP OF WATER:  - pantoprazole (PROTONIX) 4  - venlafaxine XR (EFFEXOR-XR)  - pregabalin (LYRICA)   One week prior to surgery: Stop Anti-inflammatories (NSAIDS) such as Advil, Aleve, Ibuprofen, Motrin, Naproxen, Naprosyn and Aspirin based products such as Excedrin, Goodys Powder, BC Powder.  Stop ANY OVER THE COUNTER supplements until after surgery.  You may however, continue to take Tylenol if needed for pain up until the day of surgery.  No Alcohol for 24 hours before or after  surgery.  No Smoking including e-cigarettes for 24 hours prior to surgery.  No chewable tobacco products for at least 6 hours prior to surgery.  No nicotine patches on the day of surgery.  Do not use any "recreational" drugs for at least a week prior to your surgery.  Please be advised that the combination of cocaine and anesthesia may have negative outcomes, up to and including death. If you test positive for cocaine, your surgery will be cancelled.  On the morning of surgery brush your teeth with toothpaste and water, you may rinse your mouth with mouthwash if you wish. Do not swallow any toothpaste or mouthwash.  Use CHG Soap or wipes as directed on instruction sheet.  Do not wear jewelry, make-up, hairpins, clips or nail polish.  Do not wear lotions, powders, or perfumes.   Do not shave body from the neck down 48 hours prior to surgery just in case you cut yourself which could leave a site for infection. Also, freshly shaved skin may become irritated if using the CHG soap.  Contact lenses, hearing aids and dentures may not be worn into surgery.  Do not bring valuables to the hospital. [facility] is not responsible for any missing/lost belongings or valuables.   Notify your doctor if there is any change in your medical condition (cold, fever, infection).  Wear comfortable clothing (specific to your surgery type) to the hospital.  After surgery, you can help prevent lung complications by doing breathing exercises.  Take deep breaths and cough every 1-2  hours. Your doctor may order a device called an Incentive Spirometer to help you take deep breaths. When coughing or sneezing, hold a pillow firmly against your incision with both hands. This is called "splinting." Doing this helps protect your incision. It also decreases belly discomfort.  If you are being admitted to the hospital overnight, leave your suitcase in the car. After surgery it may be brought to your room.  If you  are being discharged the day of surgery, you will not be allowed to drive home. You will need a responsible adult (18 years or older) to drive you home and stay with you that night.   If you are taking public transportation, you will need to have a responsible adult (18 years or older) with you. Please confirm with your physician that it is acceptable to use public transportation.   Please call the [facility] at [phone] if you have any questions about these instructions.  Surgery Visitation Policy:  Patients undergoing a surgery or procedure may have two family members or support persons with them as long as the person is not COVID-19 positive or experiencing its symptoms.   Inpatient Visitation:    Visiting hours are 7 a.m. to 8 p.m. Up to four visitors are allowed at one time in a patient room, including children. The visitors may rotate out with other people during the day. One designated support person (adult) may remain overnight.

## 2022-01-03 ENCOUNTER — Encounter
Admission: RE | Admit: 2022-01-03 | Discharge: 2022-01-03 | Disposition: A | Discharge status: Home / Self Care | Payer: Medicare Other | Source: Ambulatory Visit | Attending: Surgery | Admitting: Surgery

## 2022-01-03 DIAGNOSIS — Z01812 Encounter for preprocedural laboratory examination: Secondary | ICD-10-CM

## 2022-01-03 DIAGNOSIS — Z01810 Encounter for preprocedural cardiovascular examination: Secondary | ICD-10-CM | POA: Insufficient documentation

## 2022-01-04 ENCOUNTER — Encounter: Payer: Self-pay | Admitting: Surgery

## 2022-01-04 NOTE — Progress Notes (Signed)
Perioperative Services  Pre-Admission/Anesthesia Testing Clinical Review  Date: 01/05/22  Patient Demographics:  Name: Patricia Li DOB:   Jan 28, 1961 MRN:   [id number]  Planned Surgical Procedure(s):    Case: [id number] Date/Time: 01/06/22 1150   Procedure: RIGHT KNEE ARTHROSCOPY WITH DEBRIDEMENT AND ABRASION CHONDROPLASTY AND A BONE GRAFT OF A BONE CYST INVOLVING THE LATERAL TIBIAL PLATEAU OF RIGHT KNEE. 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 Clinical nursing staff has updated patient's PMH/PSHx, current medication list, and drug allergies/intolerances to ensure comprehensive history available to assist in medical decision making as it pertains to the aforementioned surgical procedure and anticipated anesthetic course. Extensive review of available clinical information performed. [place] PMH and PSHx updated with any diagnoses/procedures that  may have been inadvertently omitted during her intake with the pre-admission testing department's nursing staff.  Clinical Discussion:  Patricia Li is a 61 y.o. female who is submitted for pre-surgical anesthesia review and clearance prior to her undergoing the above procedure. Patient is a Current Smoker (10 pack years). Pertinent PMH includes: CAD, secondary cardiomyopathy, CHF, palpitations, tachycardia, BILATERAL carotid artery stenosis, aortic atherosclerosis, HTN, HLD, hypothyroidism, panhypopituitarism, secondary hyperparathyroidism, adrenal insufficiency, CKD-III, DOE, OSAH (does not required PAP therapy), GERD (on daily PPI), anemia, OA, neuropathy, tremor, anxiety (on BZO), depression, bipolar disorder.  Patient is followed by cardiology Nehemiah Massed,  MD). She was last seen in the cardiology clinic on 10/23/2020; notes reviewed.  At the time of her clinic visit, patient complained of worsening palpitations/tachycardia with associated shortness of breath and significant fatigue.  She denied any episodes of chest pain, PND, orthopnea, significant peripheral edema, vertiginous symptoms, or presyncope/syncope.  Patient with a past medical history significant for cardiovascular diagnoses.  Most recent TTE was performed on 01/16/2018 revealing a low normal left ventricular systolic function with an EF of 50%.  There were no regional wall motion abnormalities.  LEFT atrium mildly enlarged right ventricular size and function normal.  There was mild mitral and tricuspid valve regurgitation, in addition to moderate aortic valve regurgitation noted.  There was no evidence of a significant transvalvular gradient to suggest stenosis.  Blood pressure well controlled at 120/76 mmHg without the use of pharmacological intervention.  Patient is on rosuvastatin for her HLD diagnosis and ASCVD prevention.  She is not diabetic. Patient does not have an OSAH diagnosis.  Patient did have coronary artery calcifications noted on CT scan back in 2019. Patient has a history of adrenal insufficiency related to previous treatments utilized to treat her malignant melanoma.  She is under the care of endocrinology at [facility].  Patient requires daily corticosteroid treatment for this diagnosis. Functional capacity, as defined by DASI, is documented as being >/= 4 METS.  No changes were made to her medication regimen.  Patient to follow-up with outpatient cardiology in 3 months or sooner if needed.  Kyren T Sinquefield is scheduled for an elective RIGHT KNEE ARTHROSCOPY WITH DEBRIDEMENT AND ABRASION CHONDROPLASTY AND A BONE GRAFT OF A BONE CYST INVOLVING THE LATERAL TIBIAL PLATEAU OF RIGHT KNEE on 01/06/2022 with Dr. Milagros Evener, MD.  Given patient's past medical history significant for  cardiovascular diagnoses, presurgical cardiac clearance  was sought by the PAT team.  Additionally, given patient's adrenal insufficiency, she will require likely require perioperative stress dose steroids. Input from primary endocrinologist has been solicited by PAT team; awaiting response. Specialty clearances were obtained as follows:  Endocrinology - we are awaiting response from provider. Will update note when received. Noting to make surgeon and anesthesia aware of adrenal insufficiency diagnosis and potential additional perioperative needs.    Per cardiology, "this patient is optimized for surgery and may proceed with the planned procedural course with a LOW risk of significant perioperative cardiovascular complications". In review of her medication reconciliation, the patient is not noted to be taking any type of anticoagulation or antiplatelet therapies that would need to be held during her perioperative course.  Patient reports previous perioperative complications with anesthesia in the past. Patient has a PMH (+) for PONV. Symptoms and history of PONV will be discussed with patient by anesthesia team on the day of her procedure. Interventions will be ordered as deemed necessary based on patient's individual care needs as determined by anesthesiologist.  In review of the available records, it is noted that patient underwent a general anesthetic course at [facility] ([facility]) in 05/2021 without documented complications.      12/29/2021    3:04 PM 11/23/2021    2:02 PM 07/23/2021    2:00 PM  Vitals with BMI  Height '5\' 7"'$     Weight 146 lbs 144 lbs 146 lbs 6 oz  BMI 24.23    Systolic  536 144  Diastolic  73 47  Pulse  65 82    Providers/Specialists:   NOTE: Primary physician provider listed below. Patient may have been seen by APP or partner within same practice.   PROVIDER ROLE / SPECIALTY LAST OV  Poggi, Marshall Cork, MD Orthopedics (Surgeon) 01/03/2022  Martie Lee, MD  Primary Care Provider 09/16/2021  Serafina Royals, MD Cardiology 10/23/2020  Jennings Books, MD Neurology 12/29/2021  Anthonette Legato, MD Nephrology 11/02/2021  Lavone Orn, MD Endocrinology 08/12/2021   Allergies:  Clindamycin, Levaquin [levofloxacin], Aripiprazole, Depakote [divalproex sodium], Prednisone, Valproic acid, Ondansetron hcl, Oxycodone-acetaminophen, and Promethazine  Current Home Medications:   No current facility-administered medications for this encounter.    acetaminophen (TYLENOL) 650 MG CR tablet   calcitRIOL (ROCALTROL) 0.25 MCG capsule   calcium carbonate (OSCAL) 1500 (600 Ca) MG TABS tablet   clonazePAM (KLONOPIN) 0.5 MG tablet   ferrous sulfate 325 (65 FE) MG tablet   hydrocortisone (CORTEF) 10 MG tablet   magnesium gluconate (MAGONATE) 500 MG tablet   Metoclopramide HCl (REGLAN PO)   Multiple Vitamin (MULTI-VITAMIN) tablet   nortriptyline (PAMELOR) 10 MG capsule   pantoprazole (PROTONIX) 40 MG tablet   pregabalin (LYRICA) 50 MG capsule   QUEtiapine (SEROQUEL) 50 MG tablet   rosuvastatin (CRESTOR) 10 MG tablet   venlafaxine XR (EFFEXOR-XR) 75 MG 24 hr capsule   History:   Past Medical History:  Diagnosis Date   Adrenal insufficiency (HCC)    Anemia    Anxiety    Anxiety    a.) on BZO (clonazepam) PRN   Aortic atherosclerosis (HCC)    Arthritis    Bilateral carotid artery stenosis    a.) carotid doppler 11/10/2020: 50-69% BICA   Bipolar disorder (HCC)    Cardiomyopathy ([place])    a.) TTE 04/21/2014: EF 40%; b.) TTE 01/28/2016: EF 45%; c.) TTE 12/02/2016: EF 55-60%; d.) TTE 01/16/2018: EF 50   CHF (congestive heart failure) ([place]) 2016   a.) TTE 04/21/2014:  EF 40%, glob HK, mild LVH, mild BAE, triv AR/PR, mild TR, mod MR; b.) TTE 01/28/2016: EF 45%, mild BAE, triv AR/PR, mild TR, mod MR; c.) TTE 12/02/2016: EF 55-60%, mild TR; d.) TTE 01/16/2018: EF 50, mild LVH, LAE, mild MR/TR, mod AR   CKD (chronic kidney disease), stage III (HCC)    Coronary  artery calcification seen on CT scan    Depression    Diffuse brain atrophy (HCC)    Diverticulosis    DOE (dyspnea on exertion)    GERD (gastroesophageal reflux disease) 2012   HLD (hyperlipidemia)    Hypertension    Hypothyroidism    Lumbar herniated disc 2010   Malignant melanoma of skin of lower limb, including hip ([place]) 2008, 2012, 2017   right thigh/2017- stage 4   Neuropathy    OSA (obstructive sleep apnea)    a.) does not require nocturnal PAP therapy   Osteoporosis, postmenopausal    Overactive bladder    Palpitations    Pancreatitis    Panhypopituitarism ([place])    Personal history of chemotherapy 2008   melonoma   Personal history of malignant melanoma of skin 2012   Personal history of radiation therapy 2017   melonoma   Pneumonia    PONV (postoperative nausea and vomiting)    Rectal bleeding    Secondary hyperparathyroidism of renal origin ([place])    Supraglottitis    Tachycardia    Tendonitis    Tick bite of head 07/20/2015   Tremor    Weight loss, unintentional    Past Surgical History:  Procedure Laterality Date   BACK SURGERY  2010   BREAST CYST EXCISION  1985   COLONOSCOPY  07/2012   [facility]   COLONOSCOPY WITH PROPOFOL N/A 11/21/2016   Procedure: COLONOSCOPY WITH PROPOFOL;  Surgeon: Manya Silvas, MD;  Location: [facility] ENDOSCOPY;  Service: Endoscopy;  Laterality: N/A;   ESOPHAGOGASTRODUODENOSCOPY     ESOPHAGOGASTRODUODENOSCOPY (EGD) WITH PROPOFOL N/A 11/21/2016   Procedure: ESOPHAGOGASTRODUODENOSCOPY (EGD) WITH PROPOFOL;  Surgeon: Manya Silvas, MD;  Location: [facility] ENDOSCOPY;  Service: Endoscopy;  Laterality: N/A;   FRACTURE SURGERY     rod removed from left femur 07/21/2014   INGUINAL LYMPH NODE BIOPSY Right 07/17/2015   Procedure: INGUINAL LYMPH NODE BIOPSY/EXCISION;  Surgeon: Robert Bellow, MD;  Location: [facility] ORS;  Service: General;  Laterality: Right;   laminnotomy reexploration posterior lumbar   12/26/2008   with nerve decomp and discectomy    MELANOMA EXCISION  2008   right thigh   PORTACATH PLACEMENT Left 03/14/2016   Procedure: INSERTION PORT-A-CATH;  Surgeon: Robert Bellow, MD;  Location: [facility] ORS;  Service: General;  Laterality: Left;   SKIN LESION EXCISION Right 12-19-10   posterior   SPINE SURGERY     Family History  Problem Relation Age of Onset   COPD Father    Cancer Sister        Colon, sister   Breast cancer Neg Hx    Social History   Tobacco Use   Smoking status: Every Day    Packs/day: 0.50    Years: 20.00    Total pack years: 10.00    Types: Cigarettes   Smokeless tobacco: Never  Vaping Use   Vaping Use: Never used  Substance Use Topics   Alcohol use: Yes    Alcohol/week: 0.0 standard drinks of alcohol    Comment: ocassionally   Drug use: No    Pertinent Clinical Results:  LABS: Labs reviewed: Acceptable for  surgery.  No visits with results within 3 Day(s) from this visit.  Latest known visit with results is:  Appointment on 11/23/2021  Component Date Value Ref Range Status   LDH 11/23/2021 128  98 - 192 U/L Final   Performed at [facility], [address] [place], [place] [address]   Sodium 11/23/2021 137  135 - 145 mmol/L Final   Potassium 11/23/2021 3.9  3.5 - 5.1 mmol/L Final   Chloride 11/23/2021 106  98 - 111 mmol/L Final   CO2 11/23/2021 28  22 - 32 mmol/L Final   Glucose, Bld 11/23/2021 114 (H)  70 - 99 mg/dL Final   Glucose reference range applies only to samples taken after fasting for at least 8 hours.   BUN 11/23/2021 24 (H)  6 - 20 mg/dL Final   Creatinine, Ser 11/23/2021 1.82 (H)  0.44 - 1.00 mg/dL Final   Calcium 11/23/2021 8.9  8.9 - 10.3 mg/dL Final   Total Protein 11/23/2021 6.8  6.5 - 8.1 g/dL Final   Albumin 11/23/2021 4.1  3.5 - 5.0 g/dL Final   AST 11/23/2021 24  15 - 41 U/L Final   ALT 11/23/2021 17  0 - 44 U/L Final   Alkaline Phosphatase 11/23/2021 58  38 - 126 U/L Final   Total Bilirubin 11/23/2021 0.3  0.3 - 1.2 mg/dL Final   GFR, Estimated  11/23/2021 32 (L)  >60 mL/min Final   Comment: (NOTE) Calculated using the CKD-EPI Creatinine Equation (2021)    Anion gap 11/23/2021 3 (L)  5 - 15 Final   Performed at [facility], [address] [place], [place] [address]   WBC 11/23/2021 5.9  4.0 - 10.5 K/uL Final   RBC 11/23/2021 3.95  3.87 - 5.11 MIL/uL Final   Hemoglobin 11/23/2021 11.9 (L)  12.0 - 15.0 g/dL Final   HCT 11/23/2021 35.4 (L)  36.0 - 46.0 % Final   MCV 11/23/2021 89.6  80.0 - 100.0 fL Final   MCH 11/23/2021 30.1  26.0 - 34.0 pg Final   MCHC 11/23/2021 33.6  30.0 - 36.0 g/dL Final   RDW 11/23/2021 13.6  11.5 - 15.5 % Final   Platelets 11/23/2021 246  150 - 400 K/uL Final   nRBC 11/23/2021 0.0  0.0 - 0.2 % Final   Neutrophils Relative % 11/23/2021 53  % Final   Neutro Abs 11/23/2021 3.2  1.7 - 7.7 K/uL Final   Lymphocytes Relative 11/23/2021 35  % Final   Lymphs Abs 11/23/2021 2.1  0.7 - 4.0 K/uL Final   Monocytes Relative 11/23/2021 7  % Final   Monocytes Absolute 11/23/2021 0.4  0.1 - 1.0 K/uL Final   Eosinophils Relative 11/23/2021 4  % Final   Eosinophils Absolute 11/23/2021 0.2  0.0 - 0.5 K/uL Final   Basophils Relative 11/23/2021 1  % Final   Basophils Absolute 11/23/2021 0.1  0.0 - 0.1 K/uL Final   Immature Granulocytes 11/23/2021 0  % Final   Abs Immature Granulocytes 11/23/2021 0.01  0.00 - 0.07 K/uL Final   Performed at [facility], [address] [place], [place] [address]    ECG: Date: 01/03/2022 Time ECG obtained: 1145 AM Rate: 69 bpm Rhythm: normal sinus Axis (leads I and aVF): Normal Intervals: PR 208 ms. QRS 96 ms. QTc 435 ms. ST segment and T wave changes: No evidence of acute ST segment elevation or depression Comparison: Similar to previous tracing obtained on 10/23/2020   IMAGING / PROCEDURES: MR  KNEE RIGHT WO CONTRAST performed on 11/22/2021 1.6 x 1.5 x 1.8 cm subchondral intraosseous subchondral cyst in the periphery of the lateral tibial plateau with a small area  of extra osseous extension and severe surrounding bone marrow edema in the lateral tibial plateau. Differential considerations include an intraosseous subchondral cyst secondary to overlying cartilage loss versus an intraosseous ganglion cyst. Partial-thickness cartilage loss of the patellofemoral compartment with areas of high-grade partial-thickness cartilage loss involving the medial patellar facet. Partial thickness cartilage loss of the lateral femorotibial compartment with areas of high-grade partial-thickness cartilage loss of the lateral tibial plateau.   DIAGNOSTIC RADIOGRAPHS OF RIGHT KNEE 3 VIEWS performed on 11/15/2021 Tricompartmental degenerative arthrosis.  Early osteophyte formation.  Moderate narrowing of the medial compartment space is noted.  Neurotic lesion to the lateral femoral condyle. However on the lateral tibial plateau there is a lucency area that appears to penetrate  the cortex.   CT CHEST ABDOMEN PELVIS WO CONTRAST performed on 07/19/2021 No evidence of active metastatic disease within the chest, abdomen, or pelvis. Callus formation about minimally displaced right superior and inferior pubic rami fractures without suspicious underlying osseous lesion identified which appear new from recent PET-CT September 07, 2020 but given the state of healing appear somewhat remote. Suggest correlation with history of trauma. Sigmoid colonic diverticulosis without findings of acute diverticulitis. Retained versus refluxed orally ingested contrast in the esophagus, suggest clinical correlation for gastroesophageal reflux disease. Aortic atherosclerosis  TRANSTHORACIC ECHOCARDIOGRAM performed on 01/16/2018 Normal left ventricular systolic function with an EF of 50% Mild LVH Normal left ventricular diastolic Doppler parameters Mild LAE Normal right ventricular systolic function Mild MR and TR Moderate AR Normal transvalvular gradients; no valvular stenosis No pericardial  effusion  Impression and Plan:  Patricia Li has been referred for pre-anesthesia review and clearance prior to her undergoing the planned anesthetic and procedural courses. Available labs, pertinent testing, and imaging results were personally reviewed by me. This patient has been appropriately cleared by cardiology with an overall LOW risk of significant perioperative cardiovascular complications. Again, input from endocrinology has been solicited regarding the potential need for perioperative stress dosed steroids. We are awaiting response at this time. Will plan on updating note as information becomes available. Noting here for review by surgical and anesthesia teams.   Based on clinical review performed today (01/05/22), barring any significant acute changes in the patient's overall condition, it is anticipated that she will be able to proceed with the planned surgical intervention. Any acute changes in clinical condition may necessitate her procedure being postponed and/or cancelled. Patient will meet with anesthesia team (MD and/or CRNA) on the day of her procedure for preoperative evaluation/assessment. Questions regarding anesthetic course will be fielded at that time.   Pre-surgical instructions were reviewed with the patient during her PAT appointment and questions were fielded by PAT clinical staff. Patient was advised that if any questions or concerns arise prior to her procedure then she should return a call to PAT and/or her surgeon's office to discuss.  Honor Loh, MSN, APRN, FNP-C, CEN [facility]  Peri-operative Services Nurse Practitioner Phone: [phone] Fax: [phone] 01/05/22 5:00 PM  NOTE: This note has been prepared using Dragon dictation software. Despite my best ability to proofread, there is always the potential that unintentional transcriptional errors may still occur from this process.

## 2022-01-05 ENCOUNTER — Encounter: Payer: Self-pay | Admitting: Surgery

## 2022-01-05 MED ORDER — CHLORHEXIDINE GLUCONATE 0.12 % MT SOLN: 15.0000 mL | Freq: Once | OROMUCOSAL | Status: AC

## 2022-01-05 MED ORDER — ORAL CARE MOUTH RINSE: 15.0000 mL | Freq: Once | OROMUCOSAL | Status: AC

## 2022-01-05 MED ORDER — CEFAZOLIN SODIUM-DEXTROSE 2-4 GM/100ML-% IV SOLN
2.0000 g | INTRAVENOUS | Status: AC
  Administered 2022-01-06: 2 g via INTRAVENOUS

## 2022-01-05 MED ORDER — LACTATED RINGERS IV SOLN: INTRAVENOUS | Status: DC

## 2022-01-06 ENCOUNTER — Other Ambulatory Visit: Payer: Self-pay

## 2022-01-06 ENCOUNTER — Encounter
Admission: RE | Disposition: A | Discharge status: Home / Self Care | Payer: Self-pay | Source: Home / Self Care | Attending: Surgery

## 2022-01-06 ENCOUNTER — Ambulatory Visit
Admission: RE | Admit: 2022-01-06 | Discharge: 2022-01-06 | Disposition: A | Discharge status: Home / Self Care | Payer: Medicare Other | Attending: Surgery | Admitting: Surgery

## 2022-01-06 ENCOUNTER — Encounter: Payer: Self-pay | Admitting: Surgery

## 2022-01-06 ENCOUNTER — Ambulatory Visit: Payer: Medicare Other | Admitting: Urgent Care

## 2022-01-06 DIAGNOSIS — I130 Hypertensive heart and chronic kidney disease with heart failure and stage 1 through stage 4 chronic kidney disease, or unspecified chronic kidney disease: Secondary | ICD-10-CM | POA: Diagnosis not present

## 2022-01-06 DIAGNOSIS — I700 Atherosclerosis of aorta: Secondary | ICD-10-CM | POA: Insufficient documentation

## 2022-01-06 DIAGNOSIS — S83281A Other tear of lateral meniscus, current injury, right knee, initial encounter: Secondary | ICD-10-CM | POA: Diagnosis not present

## 2022-01-06 DIAGNOSIS — X58XXXA Exposure to other specified factors, initial encounter: Secondary | ICD-10-CM | POA: Insufficient documentation

## 2022-01-06 DIAGNOSIS — I2510 Atherosclerotic heart disease of native coronary artery without angina pectoris: Secondary | ICD-10-CM | POA: Insufficient documentation

## 2022-01-06 DIAGNOSIS — Z79899 Other long term (current) drug therapy: Secondary | ICD-10-CM | POA: Insufficient documentation

## 2022-01-06 DIAGNOSIS — E2740 Unspecified adrenocortical insufficiency: Secondary | ICD-10-CM | POA: Diagnosis not present

## 2022-01-06 DIAGNOSIS — M1711 Unilateral primary osteoarthritis, right knee: Secondary | ICD-10-CM | POA: Diagnosis present

## 2022-01-06 DIAGNOSIS — G4730 Sleep apnea, unspecified: Secondary | ICD-10-CM | POA: Insufficient documentation

## 2022-01-06 DIAGNOSIS — E039 Hypothyroidism, unspecified: Secondary | ICD-10-CM | POA: Diagnosis not present

## 2022-01-06 DIAGNOSIS — I083 Combined rheumatic disorders of mitral, aortic and tricuspid valves: Secondary | ICD-10-CM | POA: Insufficient documentation

## 2022-01-06 DIAGNOSIS — F17210 Nicotine dependence, cigarettes, uncomplicated: Secondary | ICD-10-CM | POA: Diagnosis not present

## 2022-01-06 DIAGNOSIS — E785 Hyperlipidemia, unspecified: Secondary | ICD-10-CM | POA: Diagnosis not present

## 2022-01-06 DIAGNOSIS — F319 Bipolar disorder, unspecified: Secondary | ICD-10-CM | POA: Diagnosis not present

## 2022-01-06 DIAGNOSIS — N183 Chronic kidney disease, stage 3 unspecified: Secondary | ICD-10-CM | POA: Diagnosis not present

## 2022-01-06 DIAGNOSIS — Z79890 Hormone replacement therapy (postmenopausal): Secondary | ICD-10-CM | POA: Insufficient documentation

## 2022-01-06 DIAGNOSIS — F419 Anxiety disorder, unspecified: Secondary | ICD-10-CM | POA: Insufficient documentation

## 2022-01-06 DIAGNOSIS — M85461 Solitary bone cyst, right tibia and fibula: Secondary | ICD-10-CM | POA: Insufficient documentation

## 2022-01-06 DIAGNOSIS — E230 Hypopituitarism: Secondary | ICD-10-CM | POA: Insufficient documentation

## 2022-01-06 DIAGNOSIS — I509 Heart failure, unspecified: Secondary | ICD-10-CM | POA: Insufficient documentation

## 2022-01-06 HISTORY — DX: Other forms of dyspnea: R06.09

## 2022-01-06 HISTORY — PX: KNEE ARTHROSCOPY WITH SUBCHONDROPLASTY: SHX6732

## 2022-01-06 HISTORY — DX: Secondary hyperparathyroidism of renal origin: N25.81

## 2022-01-06 HISTORY — DX: Obstructive sleep apnea (adult) (pediatric): G47.33

## 2022-01-06 HISTORY — DX: Diverticulosis of intestine, part unspecified, without perforation or abscess without bleeding: K57.90

## 2022-01-06 HISTORY — DX: Polyneuropathy, unspecified: G62.9

## 2022-01-06 HISTORY — DX: Other specified postprocedural states: Z98.890

## 2022-01-06 HISTORY — DX: Palpitations: R00.2

## 2022-01-06 HISTORY — DX: Tremor, unspecified: R25.1

## 2022-01-06 HISTORY — DX: Tachycardia, unspecified: R00.0

## 2022-01-06 HISTORY — DX: Hyperlipidemia, unspecified: E78.5

## 2022-01-06 HISTORY — DX: Atherosclerotic heart disease of native coronary artery without angina pectoris: I25.10

## 2022-01-06 HISTORY — DX: Atherosclerosis of aorta: I70.0

## 2022-01-06 SURGERY — ARTHROSCOPY, KNEE, WITH SUBCHONDROPLASTY
Anesthesia: General | Site: Knee | Laterality: Right

## 2022-01-06 MED ORDER — BUPIVACAINE-EPINEPHRINE (PF) 0.5% -1:200000 IJ SOLN
INTRAMUSCULAR | Status: AC
  Filled 2022-01-06: qty 60

## 2022-01-06 MED ORDER — HYDROCODONE-ACETAMINOPHEN 5-325 MG PO TABS: 1.0000 | ORAL_TABLET | Freq: Four times a day (QID) | ORAL | 0 refills | Status: DC | PRN

## 2022-01-06 MED ORDER — HYDROMORPHONE HCL 1 MG/ML IJ SOLN
INTRAMUSCULAR | Status: AC
  Filled 2022-01-06: qty 1

## 2022-01-06 MED ORDER — ACETAMINOPHEN 10 MG/ML IV SOLN
INTRAVENOUS | Status: AC
  Filled 2022-01-06: qty 100

## 2022-01-06 MED ORDER — PROPOFOL 1000 MG/100ML IV EMUL
INTRAVENOUS | Status: AC
  Filled 2022-01-06: qty 100

## 2022-01-06 MED ORDER — FENTANYL CITRATE (PF) 100 MCG/2ML IJ SOLN
INTRAMUSCULAR | Status: AC
  Filled 2022-01-06: qty 2

## 2022-01-06 MED ORDER — RINGERS IRRIGATION IR SOLN
Status: DC | PRN
  Administered 2022-01-06: 3000 mL

## 2022-01-06 MED ORDER — HYDROMORPHONE HCL 1 MG/ML IJ SOLN
0.2500 mg | INTRAMUSCULAR | Status: DC | PRN
  Administered 2022-01-06 (×2): 0.5 mg via INTRAVENOUS

## 2022-01-06 MED ORDER — PROPOFOL 10 MG/ML IV BOLUS
INTRAVENOUS | Status: DC | PRN
  Administered 2022-01-06: 200 mg via INTRAVENOUS

## 2022-01-06 MED ORDER — MIDAZOLAM HCL 2 MG/2ML IJ SOLN
INTRAMUSCULAR | Status: DC | PRN
  Administered 2022-01-06: 2 mg via INTRAVENOUS

## 2022-01-06 MED ORDER — HYDROCORTISONE SOD SUC (PF) 100 MG IJ SOLR
INTRAMUSCULAR | Status: DC | PRN
  Administered 2022-01-06: 100 mg via INTRAVENOUS

## 2022-01-06 MED ORDER — CHLORHEXIDINE GLUCONATE 0.12 % MT SOLN
OROMUCOSAL | Status: AC
  Administered 2022-01-06: 15 mL via OROMUCOSAL
  Filled 2022-01-06: qty 15

## 2022-01-06 MED ORDER — LIDOCAINE HCL (PF) 1 % IJ SOLN
INTRAMUSCULAR | Status: AC
  Filled 2022-01-06: qty 30

## 2022-01-06 MED ORDER — PHENYLEPHRINE HCL-NACL 20-0.9 MG/250ML-% IV SOLN
INTRAVENOUS | Status: DC | PRN
  Administered 2022-01-06: 20 ug/min via INTRAVENOUS

## 2022-01-06 MED ORDER — CEFAZOLIN SODIUM-DEXTROSE 2-4 GM/100ML-% IV SOLN
INTRAVENOUS | Status: AC
  Filled 2022-01-06: qty 100

## 2022-01-06 MED ORDER — ACETAMINOPHEN 10 MG/ML IV SOLN
INTRAVENOUS | Status: DC | PRN
  Administered 2022-01-06: 1000 mg via INTRAVENOUS

## 2022-01-06 MED ORDER — DIPHENHYDRAMINE HCL 50 MG/ML IJ SOLN
INTRAMUSCULAR | Status: DC | PRN
  Administered 2022-01-06: 6.25 mg via INTRAVENOUS

## 2022-01-06 MED ORDER — BUPIVACAINE-EPINEPHRINE (PF) 0.5% -1:200000 IJ SOLN
INTRAMUSCULAR | Status: DC | PRN
  Administered 2022-01-06: 10 mL
  Administered 2022-01-06: 20 mL

## 2022-01-06 MED ORDER — MIDAZOLAM HCL 2 MG/2ML IJ SOLN
INTRAMUSCULAR | Status: AC
  Filled 2022-01-06: qty 2

## 2022-01-06 MED ORDER — EPHEDRINE SULFATE (PRESSORS) 50 MG/ML IJ SOLN
INTRAMUSCULAR | Status: DC | PRN
  Administered 2022-01-06: 20 mg via INTRAVENOUS
  Administered 2022-01-06 (×2): 10 mg via INTRAVENOUS

## 2022-01-06 MED ORDER — LIDOCAINE HCL (CARDIAC) PF 100 MG/5ML IV SOSY
PREFILLED_SYRINGE | INTRAVENOUS | Status: DC | PRN
  Administered 2022-01-06: 50 mg via INTRAVENOUS

## 2022-01-06 MED ORDER — FENTANYL CITRATE (PF) 100 MCG/2ML IJ SOLN
INTRAMUSCULAR | Status: DC | PRN
  Administered 2022-01-06 (×2): 50 ug via INTRAVENOUS

## 2022-01-06 MED ORDER — HYDROCORTISONE SOD SUC (PF) 100 MG IJ SOLR
INTRAMUSCULAR | Status: AC
  Filled 2022-01-06: qty 2

## 2022-01-06 MED ORDER — PHENYLEPHRINE HCL (PRESSORS) 10 MG/ML IV SOLN
INTRAVENOUS | Status: DC | PRN
  Administered 2022-01-06 (×3): 80 ug via INTRAVENOUS

## 2022-01-06 MED ORDER — LIDOCAINE HCL (PF) 1 % IJ SOLN
INTRAMUSCULAR | Status: DC | PRN
  Administered 2022-01-06: 60 mL

## 2022-01-06 SURGICAL SUPPLY — 50 items
APL PRP STRL LF DISP 70% ISPRP (MISCELLANEOUS) ×1
APL SKNCLS STERI-STRIP NONHPOA (GAUZE/BANDAGES/DRESSINGS) ×1
BENZOIN TINCTURE PRP APPL 2/3 (GAUZE/BANDAGES/DRESSINGS) IMPLANT
BLADE FULL RADIUS 3.5 (BLADE) ×1 IMPLANT
BLADE SHAVER 4.5X7 STR FR (MISCELLANEOUS) ×1 IMPLANT
BNDG ELASTIC 6X5.8 VLCR STR LF (GAUZE/BANDAGES/DRESSINGS) ×1 IMPLANT
BNDG ESMARK 6X12 TAN STRL LF (GAUZE/BANDAGES/DRESSINGS) ×1 IMPLANT
BONE CANC CHIPS 20CC PCAN1/4 (Bone Implant) ×1 IMPLANT
CATH ROBINSON RED A/P 12FR (CATHETERS) IMPLANT
CHIPS CANC BONE 20CC PCAN1/4 (Bone Implant) ×1 IMPLANT
CHLORAPREP W/TINT 26 (MISCELLANEOUS) ×1 IMPLANT
COLLECTOR GRAFT TISSUE (SYSTAGENIX WOUND MANAGEMENT) ×1
CUFF TOURN SGL QUICK 24 (TOURNIQUET CUFF)
CUFF TOURN SGL QUICK 34 (TOURNIQUET CUFF)
CUFF TRNQT CYL 24X4X16.5-23 (TOURNIQUET CUFF) IMPLANT
CUFF TRNQT CYL 34X4.125X (TOURNIQUET CUFF) IMPLANT
DRAPE ARTHRO LIMB 89X125 STRL (DRAPES) ×1 IMPLANT
DRAPE C-ARM XRAY 36X54 (DRAPES) ×1 IMPLANT
ELECT REM PT RETURN 9FT ADLT (ELECTROSURGICAL) ×1
ELECTRODE REM PT RTRN 9FT ADLT (ELECTROSURGICAL) ×1 IMPLANT
GAUZE SPONGE 4X4 12PLY STRL (GAUZE/BANDAGES/DRESSINGS) ×1 IMPLANT
GLOVE BIO SURGEON STRL SZ8 (GLOVE) ×2 IMPLANT
GLOVE SURG UNDER LTX SZ8 (GLOVE) ×1 IMPLANT
GOWN STRL REUS W/ TWL LRG LVL3 (GOWN DISPOSABLE) ×1 IMPLANT
GOWN STRL REUS W/ TWL XL LVL3 (GOWN DISPOSABLE) ×1 IMPLANT
GOWN STRL REUS W/TWL LRG LVL3 (GOWN DISPOSABLE) ×1
GOWN STRL REUS W/TWL XL LVL3 (GOWN DISPOSABLE) ×1
GRAFT BNE CANC CHIPS 1-8 20CC (Bone Implant) IMPLANT
IV LACTATED RINGER IRRG 3000ML (IV SOLUTION) ×2
IV LR IRRIG 3000ML ARTHROMATIC (IV SOLUTION) ×2 IMPLANT
KIT TURNOVER KIT A (KITS) ×1 IMPLANT
MANIFOLD NEPTUNE II (INSTRUMENTS) ×2 IMPLANT
NDL HYPO 21X1.5 SAFETY (NEEDLE) ×1 IMPLANT
NEEDLE HYPO 21X1.5 SAFETY (NEEDLE) ×1 IMPLANT
PACK ARTHROSCOPY KNEE (MISCELLANEOUS) ×1 IMPLANT
PAD ABD DERMACEA PRESS 5X9 (GAUZE/BANDAGES/DRESSINGS) IMPLANT
PUTTY DBX 1CC (Putty) ×1 IMPLANT
PUTTY DBX 1CC DEPUY (Putty) IMPLANT
SLEEVE REMOTE CONTROL 5X12 (DRAPES) IMPLANT
SPONGE T-LAP 18X18 ~~LOC~~+RFID (SPONGE) ×1 IMPLANT
STRIP CLOSURE SKIN 1/4X4 (GAUZE/BANDAGES/DRESSINGS) IMPLANT
SUT PROLENE 4 0 PS 2 18 (SUTURE) IMPLANT
SUT VIC AB 3-0 SH 27 (SUTURE) ×1
SUT VIC AB 3-0 SH 27X BRD (SUTURE) IMPLANT
SYR 50ML LL SCALE MARK (SYRINGE) ×1 IMPLANT
TISSUE GRAFT COLLECTOR (SYSTAGENIX WOUND MANAGEMENT) IMPLANT
TRAP FLUID SMOKE EVACUATOR (MISCELLANEOUS) ×1 IMPLANT
TUBING INFLOW SET DBFLO PUMP (TUBING) ×1 IMPLANT
WAND WEREWOLF FLOW 90D (MISCELLANEOUS) ×1 IMPLANT
WATER STERILE IRR 500ML POUR (IV SOLUTION) ×1 IMPLANT

## 2022-01-06 NOTE — Discharge Instructions (Addendum)
Orthopedic discharge instructions: Keep dressing dry and intact.  May shower after dressing changed on post-op day #4 (Monday).  Cover sutures with Band-Aids after drying off. Apply ice frequently to knee. Take pain medication as prescribed or ES Tylenol when needed.  Patient to toe-touch weight-bear only on right leg - use crutches or walker for ambulation. Follow-up in 10-14 days or as scheduled.  AMBULATORY SURGERY  DISCHARGE INSTRUCTIONS   The drugs that you were given will stay in your system until tomorrow so for the next 24 hours you should not:  Drive an automobile Make any legal decisions Drink any alcoholic beverage   You may resume regular meals tomorrow.  Today it is better to start with liquids and gradually work up to solid foods.  You may eat anything you prefer, but it is better to start with liquids, then soup and crackers, and gradually work up to solid foods.   Please notify your doctor immediately if you have any unusual bleeding, trouble breathing, redness and pain at the surgery site, drainage, fever, or pain not relieved by medication.    Additional Instructions:        Please contact your physician with any problems or Same Day Surgery at [phone], Monday through Friday 6 am to 4 pm, or [place] at [facility] number at [phone].

## 2022-01-06 NOTE — Progress Notes (Signed)
Crutch teaching per [employer] RN

## 2022-01-06 NOTE — Anesthesia Procedure Notes (Signed)
Procedure Name: LMA Insertion Date/Time: 01/06/2022 12:19 PM  Performed by: Biagio Borg, CRNAPre-anesthesia Checklist: Patient identified, Emergency Drugs available, Suction available and Patient being monitored Patient Re-evaluated:Patient Re-evaluated prior to induction Oxygen Delivery Method: Circle system utilized Preoxygenation: Pre-oxygenation with 100% oxygen Induction Type: IV induction Ventilation: Mask ventilation without difficulty LMA: LMA inserted LMA Size: 4.0 Tube type: Oral Number of attempts: 1 Placement Confirmation: positive ETCO2 and breath sounds checked- equal and bilateral Tube secured with: Tape Dental Injury: Teeth and Oropharynx as per pre-operative assessment

## 2022-01-06 NOTE — H&P (Signed)
History of Present Illness: Patricia Li is a 60 y.o. female who presents today for her surgical history and physical for upcoming right knee arthroscopy with debridement and abrasion chondroplasty as well as bone grafting of tibial bone cyst. Surgery scheduled Dr. Roland Rack on 01/06/2022. The patient denies any changes in her medical history since her last evaluation. The patient denies any personal history of heart attack or stroke at today's visit. She does have a history of congestive heart failure. The patient denies any personal history of asthma or COPD at today's visit. She denies any history of DVT. She is not diabetic. The patient does still report moderate discomfort especially along the lateral aspect of the knee over the tibia. She denies any significant pain at today's appointment however. She denies any falls or trauma affecting the right lower extremity since her last evaluation. She denies any numbness or ting on today's visit. No fevers or chills.  Past Medical History: Adrenal insufficiency (CMS-HCC)  Aortic atherosclerosis (CMS-HCC)  on CT scan 07/2019  Arrhythmia 2016  Arthritis 2012  Bipolar depression (CMS-HCC)  Cardiomyopathy, secondary (CMS-HCC)  CHF (congestive heart failure) (CMS-HCC) 2016  Chronic kidney disease 2004  Cigarette smoker  COVID-19 11/2020  Depression  GERD (gastroesophageal reflux disease)  Hypertension  Hypothyroidism  Melanoma (CMS-HCC) 2008, 2017  metastatic melanoma, BRAF+, of right lower extremity  Neuropathy 2012  Result of treatment  Osteopenia  [facility] 11/2018 [facility]  Overactive bladder  Pancreatitis 07/2016  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  IPILILUMAB  PONV (postoperative nausea and vomiting)  Secondary hyperparathyroidism of renal origin (CMS-HCC)  Sleep apnea (no cpap)  Tremor 2007  Result of paxil   Past Surgical History: LAMINOTOMY REEXPLORATION POSTERIOR LUMBAR W/NERVE DECOMP & DISCECTOMY 12/26/2008 (L5-S1)  COLONOSCOPY  08/01/2012  FH Colon Polyps (Sister): CBF 07/2017  EGD 08/01/2012 (No repeat per RTE)  COLONOSCOPY 11/21/2016  FH Colon Polyps (Sister): CBF 10/2021  EGD 11/21/2016 (Gastritis: No repeat per RTE)  ENDOSCOPIC ULTRASOUND N/A 11/27/2017  Procedure: Upper Endoscopic Ultrasound; Surgeon: Burbridge, Georgiann Cocker, MD; Location: [facility]; Service: Gastroenterology; Laterality: N/A;  COLONOSCOPY W/BIOPSY N/A 12/06/2017  Procedure: Colonoscopy; Surgeon: Sharman Crate, MD; Location: [facility]; Service: Gastroenterology; Laterality: N/A;  COLONOSCOPY W/REMOVAL LESIONS BY SNARE N/A 12/06/2017  Procedure: COLONOSCOPY, FLEXIBLE; WITH REMOVAL OF TUMOR(S), POLYP(S), OR OTHER LESION(S) BY SNARE TECHNIQUE; Surgeon: Sharman Crate, MD; Location: [facility]; Service: Gastroenterology; Laterality: N/A;  EGD 09/29/2020 (Esophagitis/No Repeat/TKT)  BREAST SURGERY  Broken femur Left  melanoma resection 2008, 2012, 2017  right thigh, right groin with lymph node removal  microdiskectomy  L4-5 left side with partial hemilaminectomy, partial facetectomy, foraminotomy and diskecomt  MUSCLE BIOPSY  SPINE SURGERY   Past Family History: Coronary Artery Disease (Blocked arteries around heart) Father  Cardiomegaly, a fib  High blood pressure (Hypertension) Father  Hyperlipidemia (Elevated cholesterol) Father  Hypothyroidism Mother  Stroke Paternal Grandmother  Myocardial Infarction (Heart attack) Paternal Grandmother  Breast cancer Maternal Aunt  Colon polyps Sister  Cancer Sister  Neuropathy Sister  Tremor Sister  High blood pressure (Hypertension) Sister  Migraines Daughter  Parkinsonism Paternal Grandfather   Medications: acetaminophen (TYLENOL) 650 MG ER tablet Take 650 mg by mouth every 8 (eight) hours as needed  calcitRIOL (ROCALTROL) 0.5 MCG capsule Take 0.5 mcg by mouth every morning  clonazePAM (KLONOPIN) 0.5 MG tablet Take 0.5 mg by mouth once daily as  needed for Anxiety As needed in am; 1 tablet nightly  ferrous sulfate (IRON ORAL) Take 1  tablet by mouth once daily  fluconazole (DIFLUCAN) 150 MG tablet TAKE ONE TABLET BY MOUTH AT ONSET OF SYMPTOMS AND REPEAT ONE A WEEK LATER IF NEEDED  hydrocortisone (CORTEF) 10 MG tablet TAKE 1 TABLET BY MOUTH IN THE MORNING AND 1 TABLET BY MOUTH IN THE EVENING 180 tablet 3  levothyroxine (SYNTHROID) 150 MCG tablet TAKE 1 TABLET BY MOUTH DAILY ON AN EMPTY STOMACH WITH A GLASS OF WATER AT LEAST 30-60 MINUTES BEFORE BREAKFAST. 90 tablet 3  magnesium oxide (MAG-OX) 400 mg (241.3 mg magnesium) tablet Take 400 mg by mouth once daily  pantoprazole (PROTONIX) 40 MG DR tablet Take 40 mg by mouth 2 (two) times daily  pregabalin (LYRICA) 50 MG capsule TAKE 1 CAPSULE BY MOUTH THREE TIMES A DAY 90 capsule 3  QUEtiapine (SEROQUEL) 50 MG tablet Take 25 mg by mouth at bedtime as needed  rosuvastatin (CRESTOR) 5 MG tablet Take 2 tablets (10 mg total) by mouth once daily  venlafaxine (EFFEXOR-XR) 150 MG XR capsule Take 75 mg by mouth 2 (two) times daily 75 mg 3 times a day.  VITAMIN D3 ORAL Take 15,000 Units by mouth once daily   Allergies: Clindamycin Shortness Of Breath and Unknown  Levofloxacin Other (See Comments)  Joint inflammation  Ondansetron Unknown  Abilify [Aripiprazole] Other (See Comments)  Facial twitching  Depakote [Divalproex] Other (See Comments)  Facial twitching  Ondansetron Hcl Nausea And Vomiting  Oxycodone-Acetaminophen Itching  States too strong, altered mental status  Prednisone Other (See Comments)  Prescribed and misunderstood the directions, Took too much and altered mental status with physical injury.  Promethazine Nausea And Vomiting  Valproic Acid Other (See Comments)  Facial twitching   Review of Systems:  A comprehensive 14 point ROS was performed, reviewed by me today, and the pertinent orthopaedic findings are documented in the HPI.  Physical Exam: BP 126/82  Ht 170.2 cm (5'  7")  Wt 64.7 kg (142 lb 9.6 oz)  LMP 07/29/2005 (Approximate)  BMI 22.33 kg/m  General/Constitutional: The patient appears to be well-nourished, well-developed, and in no acute distress. Neuro/Psych: Normal mood and affect, oriented to person, place and time. Eyes: Non-icteric. Pupils are equal, round, and reactive to light, and exhibit synchronous movement. ENT: Unremarkable. Lymphatic: No palpable adenopathy. Respiratory: Lungs clear to auscultation, Normal chest excursion, No wheezes, and Non-labored breathing Cardiovascular: Regular rate and rhythm. No murmurs. and No edema, swelling or tenderness, except as noted in detailed exam. Integumentary: No impressive skin lesions present, except as noted in detailed exam. Musculoskeletal: Unremarkable, except as noted in detailed exam.  Right knee exam: GAIT: mild limp and uses no assistive devices. ALIGNMENT: normal SKIN: unremarkable SWELLING: minimal EFFUSION: trace WARMTH: no warmth TENDERNESS: moderate over the lateral joint line, especially over the lateral aspect of the proximal tibia, but no medial joint line tenderness and no peripatellar tenderness. ROM: 0 to 135 degrees McMURRAY'S: negative PATELLOFEMORAL: normal tracking with no peri-patellar tenderness and negative apprehension sign CREPITUS: Mild patellofemoral crepitance LACHMAN'S: negative PIVOT SHIFT: negative ANTERIOR DRAWER: negative POSTERIOR DRAWER: negative VARUS/VALGUS: stable  She is neurovascular intact to the right lower extremity and foot.  Imaging: MRI OF THE RIGHT KNEE WITHOUT CONTRAST:  1.6 x 1.5 x 1.8 cm subchondral intraosseous subchondral cyst in  the periphery of the lateral tibial plateau with a small area of  extra osseous extension and severe surrounding bone marrow edema in  the lateral tibial plateau. Differential considerations include an  intraosseous subchondral cyst secondary to overlying cartilage loss  versus  an intraosseous ganglion  cyst.  2. Partial-thickness cartilage loss of the patellofemoral  compartment with areas of high-grade partial-thickness cartilage  loss involving the medial patellar facet.  3. Partial thickness cartilage loss of the lateral femorotibial  compartment with areas of high-grade partial-thickness cartilage  loss of the lateral tibial plateau.   Impression: 1. Solitary bone cyst of right tibia. 2. Primary osteoarthritis of right knee.  Plan:  1. Treatment options were discussed today with the patient. 2. The patient is scheduled to undergo a right knee arthroscopy with debridement and abrasion chondroplasty in addition to bone grafting of tibial bone cyst. 3. Surgery is scheduled Dr. Roland Rack on 01/06/2022. The patient was instructed on the risk and benefits of surgical intervention and wishes to proceed at this time. 4. This document will serve as a surgical history and physical for the patient. 5. The patient will follow-up per standard postop protocol. They can call the clinic they have any questions, new symptoms develop or symptoms worsen.  The procedure was discussed with the patient, as were the potential risks (including bleeding, infection, nerve and/or blood vessel injury, persistent or recurrent pain, failure of the repair, progression of arthritis, need for further surgery, blood clots, strokes, heart attacks and/or arhythmias, pneumonia, etc.) and benefits. The patient states her understanding and wishes to proceed.    H&P reviewed and patient re-examined. No changes.

## 2022-01-06 NOTE — Anesthesia Preprocedure Evaluation (Addendum)
Anesthesia Evaluation  Patient identified by MRN, date of birth, ID band Patient awake    Reviewed: Allergy & Precautions, NPO status , Patient's Chart, lab work & pertinent test results  History of Anesthesia Complications (+) PONV and history of anesthetic complications  Airway Mallampati: II  TM Distance: >3 FB Neck ROM: full    Dental  (+) Teeth Intact   Pulmonary sleep apnea , Current Smoker   Pulmonary exam normal        Cardiovascular hypertension, On Medications (-) angina + CAD, + Peripheral Vascular Disease, +CHF and + DOE  (-) Orthopnea Normal cardiovascular exam  Patient is followed by cardiology Nehemiah Massed, MD). She was last seen in the cardiology clinic on 10/23/2020; notes reviewed.  At the time of her clinic visit, patient complained of worsening palpitations/tachycardia with associated shortness of breath and significant fatigue.  She denied any episodes of chest pain, PND, orthopnea, significant peripheral edema, vertiginous symptoms, or presyncope/syncope.  Patient with a past medical history significant for cardiovascular diagnoses.    Most recent TTE was performed on 01/16/2018 revealing a low normal left ventricular systolic function with an EF of 50%.  There were no regional wall motion abnormalities.  LEFT atrium mildly enlarged right ventricular size and function normal.  There was mild mitral and tricuspid valve regurgitation, in addition to moderate aortic valve regurgitation noted.  There was no evidence of a significant transvalvular gradient to suggest stenosis.    Neuro/Psych  Headaches PSYCHIATRIC DISORDERS Anxiety Depression Bipolar Disorder    Neuromuscular disease    GI/Hepatic Neg liver ROS,GERD  ,,  Endo/Other  Hypothyroidism  Hyperparathyroid, panhypopituitarism  Adrenal insufficiency on daily steroids   Renal/GU Renal disease (CKD3)     Musculoskeletal  (+) Arthritis , Osteoarthritis,     Abdominal   Peds  Hematology  (+) Blood dyscrasia, anemia   Anesthesia Other Findings Past Medical History: No date: Adrenal insufficiency (HCC) No date: Anemia No date: Anxiety No date: Anxiety     Comment:  a.) on BZO (clonazepam) PRN No date: Aortic atherosclerosis (HCC) No date: Arthritis No date: Bilateral carotid artery stenosis     Comment:  a.) carotid doppler 11/10/2020: 50-69% BICA No date: Bipolar disorder ([place]) No date: Cardiomyopathy [facility])     Comment:  a.) TTE 04/21/2014: EF 40%; b.) TTE 01/28/2016: EF 45%;               c.) TTE 12/02/2016: EF 55-60%; d.) TTE 01/16/2018: EF 50 2016: CHF (congestive heart failure) ([place])     Comment:  a.) TTE 04/21/2014: EF 40%, glob HK, mild LVH, mild BAE,              triv AR/PR, mild TR, mod MR; b.) TTE 01/28/2016: EF 45%,               mild BAE, triv AR/PR, mild TR, mod MR; c.) TTE               12/02/2016: EF 55-60%, mild TR; d.) TTE 01/16/2018: EF               50, mild LVH, LAE, mild MR/TR, mod AR No date: CKD (chronic kidney disease), stage III (HCC) No date: Coronary artery calcification seen on CT scan No date: Depression No date: Diffuse brain atrophy ([place]) No date: Diverticulosis No date: DOE (dyspnea on exertion) 2012: GERD (gastroesophageal reflux disease) No date: HLD (hyperlipidemia) No date: Hypertension No date: Hypothyroidism 2010: Lumbar herniated disc 2008, 2012, 2017: Malignant  melanoma of skin of lower limb, including  hip ([place])     Comment:  right thigh/2017- stage 4 No date: Neuropathy No date: OSA (obstructive sleep apnea)     Comment:  a.) does not require nocturnal PAP therapy No date: Osteoporosis, postmenopausal No date: Overactive bladder No date: Palpitations No date: Pancreatitis No date: Panhypopituitarism [facility]) 2008: Personal history of chemotherapy     Comment:  melonoma 2012: Personal history of malignant melanoma of skin 2017: Personal history of radiation therapy     Comment:   melonoma No date: Pneumonia No date: PONV (postoperative nausea and vomiting) No date: Rectal bleeding No date: Secondary hyperparathyroidism of renal origin ([place]) No date: Supraglottitis No date: Tachycardia No date: Tendonitis 07/20/2015: Tick bite of head No date: Tremor No date: Weight loss, unintentional  Past Surgical History: 2010: [place]: BREAST CYST EXCISION 07/2012: COLONOSCOPY     Comment:  [place] 11/21/2016: COLONOSCOPY WITH PROPOFOL; N/A     Comment:  Procedure: COLONOSCOPY WITH PROPOFOL;  Surgeon: Manya Silvas, MD;  Location: [facility] END[facility]COPY;  Service:               Endoscopy;  Laterality: N/A; No date: ESOPHAGOGASTRODUODENOSCOPY 11/21/2016: ESOPHAGOGASTRODUODENOSCOPY (EGD) WITH PROPOFOL; N/A     Comment:  Procedure: ESOPHAGOGASTRODUODENOSCOPY (EGD) WITH               PROPOFOL;  Surgeon: Manya Silvas, MD;  Location:               [facility] ENDOSCOPY;  Service: Endoscopy;  Laterality: N/A; No date: FRACTURE SURGERY     Comment:  rod removed from left femur 07/21/2014 07/17/2015: INGUINAL LYMPH NODE BIOPSY; Right     Comment:  Procedure: INGUINAL LYMPH NODE BIOPSY/EXCISION;                Surgeon: Robert Bellow, MD;  Location: [facility] ORS;                Service: General;  Laterality: Right; 12/26/2008: laminnotomy reexploration posterior lumbar      Comment:  with nerve decomp and discectomy 2008: MELANOMA EXCISION     Comment:  right thigh 03/14/2016: PORTACATH PLACEMENT; Left     Comment:  Procedure: INSERTION PORT-A-CATH;  Surgeon: Robert Bellow, MD;  Location: [facility] ORS;  Service: General;                Laterality: Left; 12-19-10: SKIN LESION EXCISION; Right     Comment:  posterior No date: SPINE SURGERY  BMI    Body Mass Index: 22.55 kg/m      Reproductive/Obstetrics negative OB ROS                             Anesthesia Physical Anesthesia Plan  ASA: 3  Anesthesia Plan: General    Post-op Pain Management: Toradol IV (intra-op)*, Ofirmev IV (intra-op)* and Dilaudid IV   Induction: Intravenous  PONV Risk Score and Plan: 4 or greater and Ondansetron, Dexamethasone, Midazolam and Treatment may vary due to age or medical condition  Airway Management Planned: LMA  Additional Equipment:   Intra-op Plan:   Post-operative Plan: Extubation in OR  Informed Consent: I have reviewed the patients History and Physical, chart, labs and discussed the procedure including the risks, benefits and alternatives  for the proposed anesthesia with the patient or authorized representative who has indicated his/her understanding and acceptance.     Dental Advisory Given  Plan Discussed with: Anesthesiologist, CRNA and Surgeon  Anesthesia Plan Comments: (Patient consented for risks of anesthesia including but not limited to:  - adverse reactions to medications - damage to eyes, teeth, lips or other oral mucosa - nerve damage due to positioning  - sore throat or hoarseness - Damage to heart, brain, nerves, lungs, other parts of body or loss of life  Patient voiced understanding. Patient refuses spinal anesthesia )        Anesthesia Quick Evaluation

## 2022-01-06 NOTE — Transfer of Care (Signed)
Immediate Anesthesia Transfer of Care Note  Patient: Patricia Li  Procedure(s) Performed: RIGHT KNEE ARTHROSCOPY WITH DEBRIDEMENT AND ABRASION CHONDROPLASTY AND A BONE GRAFT OF A BONE CYST INVOLVING THE LATERAL TIBIAL PLATEAU OF RIGHT KNEE. (Right: Knee)  Patient Location: PACU  Anesthesia Type:General  Level of Consciousness: sedated  Airway & Oxygen Therapy: Patient Spontanous Breathing and Patient connected to face mask  Post-op Assessment: Report given to RN and Post -op Vital signs reviewed and stable  Post vital signs: Reviewed and stable  Last Vitals:  Vitals Value Taken Time  BP 142/78 01/06/22 1350  Temp    Pulse 80 01/06/22 1354  Resp 12 01/06/22 1354  SpO2 98 % 01/06/22 1354  Vitals shown include unvalidated device data.  Last Pain:  Vitals:   01/06/22 1100  TempSrc: Temporal  PainSc: 0-No pain         Complications: No notable events documented.

## 2022-01-06 NOTE — Anesthesia Postprocedure Evaluation (Signed)
Anesthesia Post Note  Patient: Patricia Li  Procedure(s) Performed: RIGHT KNEE ARTHROSCOPY WITH DEBRIDEMENT AND ABRASION CHONDROPLASTY AND A BONE GRAFT OF A BONE CYST INVOLVING THE LATERAL TIBIAL PLATEAU OF RIGHT KNEE. (Right: Knee)  Patient location during evaluation: PACU Anesthesia Type: General Level of consciousness: awake and alert Pain management: pain level controlled Vital Signs Assessment: post-procedure vital signs reviewed and stable Respiratory status: spontaneous breathing, nonlabored ventilation, respiratory function stable and patient connected to nasal cannula oxygen Cardiovascular status: blood pressure returned to baseline and stable Postop Assessment: no apparent nausea or vomiting Anesthetic complications: no   No notable events documented.   Last Vitals:  Vitals:   01/06/22 1418 01/06/22 1424  BP:  135/72  Pulse: 94 94  Resp: 15 19  Temp:  36.6 C  SpO2: 91% 93%    Last Pain:  Vitals:   01/06/22 1424  TempSrc:   PainSc: 4                  Ilene Qua

## 2022-01-06 NOTE — Op Note (Signed)
01/06/2022  1:57 PM  Patient:   Patricia Li  Pre-Op Diagnosis:   1.  Primary osteoarthritis of right knee. 2.  Solitary bone cyst of right tibia.  Postoperative diagnosis:   Same with lateral meniscus tear, right knee.  Procedure:   1.  Extensive synovectomy with abrasion chondroplasty of focal lateral femoral condylar defect and partial lateral meniscectomy, right knee. 2.  Open curettage and bone grafting of solitary bone cyst of right lateral tibial plateau. 3.  Reinsertion of infrapatellar fat cells.  Surgeon:   Pascal Lux, MD  Assistant:   Reymundo Poll, PA-S  Anesthesia:   General LMA  Findings:   As above.  There were grade 2 chondromalacial changes involving the central patellar ridge and femoral trochlea, grade 1 chondromalacial changes involving the medial femoral condyle and medial tibial plateau, focal grade 3 chondromalacial changes involving the anterolateral aspect of the lateral femoral condyle, and grade 3-4 chondromalacial changes involving the lateral tibial plateau.  Complications:   None  EBL:   5 cc.  Total fluids:   900 cc of crystalloid.  Tourniquet time:   None  Drains:   None  Closure:   4-0 Prolene interrupted sutures for portal sites and 3-0 Vicryl subcuticular sutures for lateral incision.  Brief clinical note:   The patient is a 61 year old female with a history of lateral sided right knee pain. Her symptoms have persisted despite medications, activity modification, etc. Her history and examination are consistent with degenerative joint disease with a possible lateral meniscus tear. The patient's preoperative MRI scan also demonstrated a 1.6 x 1.5 x 1.8 cm solitary bone cyst involving the lateral portion of the lateral tibial plateau. The patient presents at this time for arthroscopic debridement and partial lateral meniscectomy, as well as a curettage and bone grafting of the solitary bone cyst involving the lateral portion of the lateral  tibial plateau.  Procedure:   The patient was brought into the operating room and lain in the supine position. After adequate general laryngeal mask anesthesia was obtained, a timeout was performed to verify the appropriate side. The patient's right knee was injected sterilely using a solution of 30 cc of 1% lidocaine and 30 cc of 0.5% Sensorcaine with epinephrine. The right lower extremity was prepped with ChloraPrep solution before being draped sterilely. Preoperative antibiotics were administered. The expected portal sites and surgical incision were injected with 0.5% Sensorcaine with epinephrine before the camera was placed in the anterolateral portal and instrumentation performed through the anteromedial portal.   The knee was sequentially examined beginning in the suprapatellar pouch, then progressing to the patellofemoral space, the medial gutter and compartment, the notch, and finally the lateral compartment and gutter. The findings were as described above. Abundant reactive synovial tissues anteriorly were debrided using the full-radius resector in order to improve visualization these fatty tissues were collected using the Arthrex GraftNet device and saved for reinsertion at the end of the case. The areas of degenerative labral fraying along the posterior and lateral margins of the lateral meniscus were debrided back to stable margins using small meniscal baskets as well as a full-radius resector. Supplement probing of the remaining rim demonstrated excellent stability. In addition, there was a focal area of grade 3 chondromalacial changes involving the anterolateral portion of the lateral femoral condyle which also was debrided back to stable margins using the full-radius resector. The instruments were removed from the joint after suctioning the excess fluid.   Next, the bone cyst was addressed.  An approximate 3 to 4 cm incision was made longitudinally centered over the lateral aspect of the lateral  tibial plateau just anterior to the fibular head and posterior to Gertie's tubercle.  This incision was carried down through the subcutaneous tissues to expose the iliotibial band.  This was split the length of the incision to expose the underlying cyst.  The solitary bone cyst had eroded through the lateral cortex and so was immediately encountered.  A small amount of gelatinous material was encountered and removed.  In addition, there was some serosanguineous fluid as well as some other amorphous tissue.  The cyst was curetted out thoroughly and the specimens sent to pathology for analysis.  Once the cyst was fully curetted, it was irrigated thoroughly with bulb irrigation before being packed using cancellous bone chips and demineralized bone matrix putty.    The lateral retinacular tissues were reapproximated using 2-0 Vicryl interrupted sutures before the subcutaneous tissues were closed using 3-0 Vicryl interrupted sutures.  Benzoin and Steri-Strips were applied to the skin.  A metal catheter was reintroduced into the joint. Through this, a #12 red rubber catheter was trimmed to the appropriate length and inserted through the metal catheter. The collected fat cells from the beginning of the case had been placed into a [place] syringe which was used to inject the fat cells back into the knee joint. The portal sites were then closed using 4-0 Prolene interrupted sutures before a sterile bulky dressing was applied to the knee.  Subsequently, the patient was awakened, extubated, and returned to the recovery room in satisfactory condition after tolerating the procedure well.

## 2022-01-07 LAB — SURGICAL PATHOLOGY

## 2022-03-15 ENCOUNTER — Encounter: Payer: Self-pay | Admitting: Oncology

## 2022-03-21 ENCOUNTER — Telehealth: Payer: Self-pay | Admitting: Internal Medicine

## 2022-03-21 NOTE — Telephone Encounter (Signed)
Pt left VM and wanted to know if she could do her labs same day as CT and make her follow up a virtual visit. Please advise

## 2022-03-22 ENCOUNTER — Encounter: Payer: Self-pay | Admitting: Oncology

## 2022-03-23 ENCOUNTER — Inpatient Hospital Stay: Payer: Medicare HMO

## 2022-03-23 ENCOUNTER — Ambulatory Visit: Payer: Medicare HMO

## 2022-03-25 ENCOUNTER — Inpatient Hospital Stay: Payer: Medicare HMO | Admitting: Internal Medicine

## 2022-03-25 ENCOUNTER — Inpatient Hospital Stay: Payer: Medicare HMO

## 2022-03-28 ENCOUNTER — Telehealth: Payer: Self-pay | Admitting: Internal Medicine

## 2022-03-28 NOTE — Telephone Encounter (Signed)
Patient called to request that her visit on Friday be virtual and her labs rescheduled to Wednesday (the day of her CT scan).   Please advise

## 2022-03-30 ENCOUNTER — Inpatient Hospital Stay: Payer: Medicare HMO | Attending: Internal Medicine

## 2022-03-30 ENCOUNTER — Encounter: Payer: Self-pay | Admitting: Internal Medicine

## 2022-03-30 ENCOUNTER — Ambulatory Visit
Admission: RE | Admit: 2022-03-30 | Discharge: 2022-03-30 | Disposition: A | Discharge status: Home / Self Care | Payer: Medicare HMO | Source: Ambulatory Visit | Attending: Internal Medicine | Admitting: Internal Medicine

## 2022-03-30 DIAGNOSIS — N184 Chronic kidney disease, stage 4 (severe): Secondary | ICD-10-CM | POA: Diagnosis not present

## 2022-03-30 DIAGNOSIS — D631 Anemia in chronic kidney disease: Secondary | ICD-10-CM | POA: Insufficient documentation

## 2022-03-30 DIAGNOSIS — E538 Deficiency of other specified B group vitamins: Secondary | ICD-10-CM | POA: Insufficient documentation

## 2022-03-30 DIAGNOSIS — C4371 Malignant melanoma of right lower limb, including hip: Secondary | ICD-10-CM | POA: Insufficient documentation

## 2022-03-30 DIAGNOSIS — C774 Secondary and unspecified malignant neoplasm of inguinal and lower limb lymph nodes: Secondary | ICD-10-CM | POA: Diagnosis not present

## 2022-03-30 DIAGNOSIS — C439 Malignant melanoma of skin, unspecified: Secondary | ICD-10-CM

## 2022-03-30 LAB — COMPREHENSIVE METABOLIC PANEL
ALT: 15 U/L (ref 0–44)
AST: 30 U/L (ref 15–41)
Albumin: 3.6 g/dL (ref 3.5–5.0)
Alkaline Phosphatase: 62 U/L (ref 38–126)
Anion gap: 7 (ref 5–15)
BUN: 15 mg/dL (ref 6–20)
CO2: 27 mmol/L (ref 22–32)
Calcium: 8.8 mg/dL — ABNORMAL LOW (ref 8.9–10.3)
Chloride: 102 mmol/L (ref 98–111)
Creatinine, Ser: 1.77 mg/dL — ABNORMAL HIGH (ref 0.44–1.00)
GFR, Estimated: 33 mL/min — ABNORMAL LOW (ref >60)
Glucose, Bld: 75 mg/dL (ref 70–99)
Potassium: 3.7 mmol/L (ref 3.5–5.1)
Sodium: 136 mmol/L (ref 135–145)
Total Bilirubin: 0.3 mg/dL (ref 0.3–1.2)
Total Protein: 6.3 g/dL — ABNORMAL LOW (ref 6.5–8.1)

## 2022-03-30 LAB — CBC WITH DIFFERENTIAL/PLATELET
Abs Immature Granulocytes: 0.01 10*3/uL (ref 0.00–0.07)
Basophils Absolute: 0 10*3/uL (ref 0.0–0.1)
Basophils Relative: 1 %
Eosinophils Absolute: 0.2 10*3/uL (ref 0.0–0.5)
Eosinophils Relative: 5 %
HCT: 35.6 % — ABNORMAL LOW (ref 36.0–46.0)
Hemoglobin: 11.8 g/dL — ABNORMAL LOW (ref 12.0–15.0)
Immature Granulocytes: 0 %
Lymphocytes Relative: 34 %
Lymphs Abs: 1.4 10*3/uL (ref 0.7–4.0)
MCH: 30.2 pg (ref 26.0–34.0)
MCHC: 33.1 g/dL (ref 30.0–36.0)
MCV: 91 fL (ref 80.0–100.0)
Monocytes Absolute: 0.4 10*3/uL (ref 0.1–1.0)
Monocytes Relative: 9 %
Neutro Abs: 2.1 10*3/uL (ref 1.7–7.7)
Neutrophils Relative %: 51 %
Platelets: 224 10*3/uL (ref 150–400)
RBC: 3.91 MIL/uL (ref 3.87–5.11)
RDW: 13.1 % (ref 11.5–15.5)
WBC: 4.1 10*3/uL (ref 4.0–10.5)
nRBC: 0 % (ref 0.0–0.2)

## 2022-03-30 LAB — LACTATE DEHYDROGENASE: LDH: 136 U/L (ref 98–192)

## 2022-03-30 LAB — IRON AND TIBC
Iron: 59 ug/dL (ref 28–170)
Saturation Ratios: 24 % (ref 10.4–31.8)
TIBC: 244 ug/dL — ABNORMAL LOW (ref 250–450)
UIBC: 185 ug/dL

## 2022-03-30 LAB — FERRITIN: Ferritin: 44 ng/mL (ref 11–307)

## 2022-04-01 ENCOUNTER — Encounter: Payer: Self-pay | Admitting: Internal Medicine

## 2022-04-01 ENCOUNTER — Inpatient Hospital Stay: Payer: Medicare HMO | Attending: Internal Medicine | Admitting: Internal Medicine

## 2022-04-01 ENCOUNTER — Other Ambulatory Visit: Payer: Medicare HMO

## 2022-04-01 ENCOUNTER — Ambulatory Visit: Payer: Medicare HMO | Admitting: Internal Medicine

## 2022-04-01 DIAGNOSIS — R918 Other nonspecific abnormal finding of lung field: Secondary | ICD-10-CM

## 2022-04-01 DIAGNOSIS — C439 Malignant melanoma of skin, unspecified: Secondary | ICD-10-CM | POA: Diagnosis not present
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
## 2022-04-01 NOTE — Progress Notes (Signed)
Patient verified using two identifiers for virtual visit via telephone today.  Chronic cold sensation in hands and right toes that cause pain.   Recent diarrhea and excess gas.
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
## 2022-04-13 ENCOUNTER — Encounter: Payer: Self-pay | Admitting: Family Medicine

## 2022-04-13 ENCOUNTER — Other Ambulatory Visit: Payer: Self-pay | Admitting: Family Medicine

## 2022-04-13 DIAGNOSIS — Z13820 Encounter for screening for osteoporosis: Secondary | ICD-10-CM

## 2022-06-02 ENCOUNTER — Other Ambulatory Visit: Payer: Self-pay

## 2022-06-02 ENCOUNTER — Telehealth: Payer: Self-pay

## 2022-06-02 ENCOUNTER — Telehealth: Payer: Self-pay | Admitting: *Deleted

## 2022-06-02 DIAGNOSIS — C439 Malignant melanoma of skin, unspecified: Secondary | ICD-10-CM

## 2022-06-02 NOTE — Telephone Encounter (Signed)
Patient called regarding a swollen lymph node. Lauren says she can see her tomorrow with labs. She agreed to come in at 2p for labs and see Lauren at 230p. Scheduler notified

## 2022-06-02 NOTE — Telephone Encounter (Signed)
Called patient to scheduled for [facility] tomorrow as advised by Ander Purpura. No answer left voicemail to come back to confirm

## 2022-06-02 NOTE — Telephone Encounter (Signed)
She has already e checked in

## 2022-06-02 NOTE — Telephone Encounter (Signed)
Patricia Li says she can see Judson Roch in the afternoon [place]. Patient could not come in the morning. She had already confirmed tomorrow. I was trying to call her back and see what day next week(with Dr. Jacinto Reap or Ander Purpura), no answer. She does not need labs now per Patricia Li. Jen/Barb can you cancel lab appointment and change to Sarah and f/u with patient. Hope she is ok with seeing Sarah. Im sorry. Thanks

## 2022-06-02 NOTE — Telephone Encounter (Signed)
Patient called reporting that she felt a tightening cord in her groin and it prompted her to palpate deeper and she feels that there is a swollen lymph node. She is asking if ashe needs to be concerned about it or not. Please advise.  She has metastatic melanoma.

## 2022-06-03 ENCOUNTER — Inpatient Hospital Stay: Payer: Medicare HMO | Attending: Internal Medicine | Admitting: Medical Oncology

## 2022-06-03 ENCOUNTER — Encounter: Payer: Self-pay | Admitting: Medical Oncology

## 2022-06-03 ENCOUNTER — Inpatient Hospital Stay: Payer: Medicare HMO

## 2022-06-03 VITALS — BP 138/58 | HR 79 | Temp 99.1°F | Wt 140.0 lb

## 2022-06-03 DIAGNOSIS — R590 Localized enlarged lymph nodes: Secondary | ICD-10-CM | POA: Insufficient documentation

## 2022-06-03 DIAGNOSIS — C4371 Malignant melanoma of right lower limb, including hip: Secondary | ICD-10-CM | POA: Diagnosis not present

## 2022-06-03 DIAGNOSIS — E2740 Unspecified adrenocortical insufficiency: Secondary | ICD-10-CM | POA: Diagnosis not present

## 2022-06-03 DIAGNOSIS — I2510 Atherosclerotic heart disease of native coronary artery without angina pectoris: Secondary | ICD-10-CM | POA: Diagnosis not present

## 2022-06-03 DIAGNOSIS — C439 Malignant melanoma of skin, unspecified: Secondary | ICD-10-CM | POA: Diagnosis not present

## 2022-06-03 DIAGNOSIS — I429 Cardiomyopathy, unspecified: Secondary | ICD-10-CM | POA: Diagnosis not present

## 2022-06-03 DIAGNOSIS — E039 Hypothyroidism, unspecified: Secondary | ICD-10-CM | POA: Insufficient documentation

## 2022-06-03 DIAGNOSIS — M954 Acquired deformity of chest and rib: Secondary | ICD-10-CM | POA: Insufficient documentation

## 2022-06-03 DIAGNOSIS — Z79899 Other long term (current) drug therapy: Secondary | ICD-10-CM | POA: Insufficient documentation

## 2022-06-03 DIAGNOSIS — N183 Chronic kidney disease, stage 3 unspecified: Secondary | ICD-10-CM | POA: Insufficient documentation

## 2022-06-03 DIAGNOSIS — M810 Age-related osteoporosis without current pathological fracture: Secondary | ICD-10-CM | POA: Insufficient documentation

## 2022-06-03 DIAGNOSIS — K219 Gastro-esophageal reflux disease without esophagitis: Secondary | ICD-10-CM | POA: Diagnosis not present

## 2022-06-03 DIAGNOSIS — R918 Other nonspecific abnormal finding of lung field: Secondary | ICD-10-CM

## 2022-06-03 DIAGNOSIS — Z79890 Hormone replacement therapy (postmenopausal): Secondary | ICD-10-CM | POA: Diagnosis not present

## 2022-06-03 DIAGNOSIS — R634 Abnormal weight loss: Secondary | ICD-10-CM

## 2022-06-03 DIAGNOSIS — I700 Atherosclerosis of aorta: Secondary | ICD-10-CM | POA: Diagnosis not present

## 2022-06-03 DIAGNOSIS — G4733 Obstructive sleep apnea (adult) (pediatric): Secondary | ICD-10-CM | POA: Diagnosis not present

## 2022-06-03 DIAGNOSIS — R591 Generalized enlarged lymph nodes: Secondary | ICD-10-CM

## 2022-06-03 DIAGNOSIS — I10 Essential (primary) hypertension: Secondary | ICD-10-CM | POA: Insufficient documentation

## 2022-06-03 DIAGNOSIS — E538 Deficiency of other specified B group vitamins: Secondary | ICD-10-CM | POA: Insufficient documentation

## 2022-06-03 DIAGNOSIS — E785 Hyperlipidemia, unspecified: Secondary | ICD-10-CM | POA: Diagnosis not present

## 2022-06-03 DIAGNOSIS — I509 Heart failure, unspecified: Secondary | ICD-10-CM | POA: Insufficient documentation

## 2022-06-03 NOTE — Progress Notes (Signed)
Symptom Management Clinic [facility] Telephone:(336) [phone] Fax:(336) [phone]  Patient Care Team: Leo GrosserWhitt, Donna, MD as PCP - General (Family Medicine) Earna CoderBrahmanday, Govinda R, MD as Consulting Physician (Internal Medicine) Stanton Kidneyoledo, Teodoro K, MD as Consulting Physician (Gastroenterology) Lemar LivingsByrnett, Merrily PewJeffrey W, MD as Consulting Physician (General Surgery)   Name of the patient: Patricia Li  [id number]  02/06/1961   Oncological History: Metastatic Melanoma   Current Treatment: Surveillance   Current Oncologist: Dr. Donneta RombergBrahmanday      Oncology History Overview Note   1. 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 TX, Nx, M1 tumor, stage IV. 2. 4 weeks of high in the Intron therapy high dose in February of 2009. 3. Low-dose interferon started from March of 2009   AUG 2012- Right thigh mass [Bx] Recurrent melanoma in the right thigh/ pelvic LN [PETscan]; NEG for BRAF V600E; April 2017 PET scan showed recurrent disease in the right inguinal region which was confirmed by biopsy. Zeboraf discontinued in November 2017 secondary to intolerable rash. Patient initiated combination immunotherapy with nivolumab and ipilumimab on January 14, 2016. Patient initiated maintenance nivolumab on April 14, 2016 and discontinued on November 07, 2016.  Nivolumab was discontinued secondary to persistent diarrhea.  s/p recmicade- Single agent oral Mekinist from December 07, 2016 through December 2018 [Dr.Finnegan]   # s/p remicaide   # OCT 2012- IPI x 2 cycles [stopped sec to intol- Side effects]   # April 2017- PET scan shows recurrent disease with- RIGHT INGUINAL adenopathy s/p Bx [Dr.Byrnett].June 2017 [OSH]- right external Iliac LN-  RT- IPI+NIVO .   # Vitamin B 12 deficiency          Malignant melanoma of skin of lower limb, including hip (HCC)     Initial Diagnosis     Malignant melanoma of skin of lower limb, including hip (HCC)      Metastatic melanoma (HCC)   06/01/2012 Initial Diagnosis     Metastatic melanoma (HCC)    Date of visit: 06/03/22  Reason for Consult: Patricia Li is a 62 y.o. female who presents alongside her daughter today for:  Bump: Patient called into our answering service yesterday stating that on Tuesday night she felt a lump in her groin. The lump was not painful in nature. She reports that she can only feel this lump when she abducts her knees to the side while bringing her heels close to her buttock. She has not noticed any other areas of concern. No fevers, night sweats. No pelvic infections, recent vaccinations, recent illness. She has lost four pounds since her last visit that has not been intentional. Currently she is on surveillance for her malignant melanoma. Last imaging was on 03/30/2022 with results shown below.   EXAM: CT CHEST WITHOUT CONTRAST   TECHNIQUE: Multidetector CT imaging of the chest was performed following the standard protocol without IV contrast.   RADIATION DOSE REDUCTION: This exam was performed according to the departmental dose-optimization program which includes automated exposure control, adjustment of the mA and/or kV according to patient size and/or use of iterative reconstruction technique.   COMPARISON:  CT scan 07/19/2021 and older. Older PET-CT scans as well   FINDINGS: Cardiovascular: No pericardial effusion. The heart is nonenlarged. Scattered coronary artery calcifications are identified. On this non IV contrast exam, the aorta has a normal course and caliber with mild plaque.   Mediastinum/Nodes: Small thyroid gland. On this non IV contrast exam there is no specific abnormal lymph node enlargement  identified in the axillary region or hila. Small superior pericardial recess. Small mediastinal nodes are again identified, unchanged from previous. These are less than a cm in short axis and not pathologic by size criteria.   Lungs/Pleura:  Bilateral apical pleural thickening and scarring with fibrotic changes. No consolidation, pneumothorax or effusion. There is a tiny left upper lobe nodule stable today on series 3, image 38 measuring 3 mm. There are some new tiny foci in the left upper lobe such as image 32 and 33 anteriorly. Several other punctate nodules identified in the left upper lobe. These are ground-glass like or ill-defined no larger dominant nodules are identified.   Upper Abdomen: Along the upper abdomen the adrenal glands are preserved.   Musculoskeletal: There is interval sclerosis and deformity of the manubrium of the sternum. Previously there was a subtle lesion in this location. Please correlate for any known specific history scattered degenerative changes noted along the spine. Question subtle deformity of the anterior aspect of the right second rib as well.   IMPRESSION: 1. There is interval sclerosis and deformity of the manubrium of the sternum. Previously there was a subtle lesion in this location. Please correlate for any known specific history. 2. Question subtle deformity of the anterior aspect of the right second rib as well. Again although these could be traumatic these are new from previous and if there is no such history of trauma would recommend further workup with a whole-body bone scan to assess for multiplicity. 3. Stable punctate left upper lobe lung nodule. However there are a few scattered new ill-defined ground-glass type nodules in the left upper lobe or semi-solid nodules. These are all 3 mm in size or smaller. This has a differential including infectious or inflammatory etiology. However with the patient's clinical history would recommend follow up in 3 months.   Study results to be called to the ordering service by the Radiology physician assistant team   Aortic Atherosclerosis (ICD10-I70.0).   Electronically Signed   By: Karen KaysAshok  Gupta M.D.   On: 03/30/2022 16:30   Wt  Readings from Last 3 Encounters:  06/03/22 140 lb (63.5 kg)  01/06/22 144 lb (65.3 kg)  12/29/21 146 lb (66.2 kg)     PAST MEDICAL HISTORY: Past Medical History:  Diagnosis Date   Adrenal insufficiency    Anemia    Anxiety    Anxiety    a.) on BZO (clonazepam) PRN   Aortic atherosclerosis    Arthritis    Bilateral carotid artery stenosis    a.) carotid doppler 11/10/2020: 50-69% BICA   Bipolar disorder    Cardiomyopathy    a.) TTE 04/21/2014: EF 40%; b.) TTE 01/28/2016: EF 45%; c.) TTE 12/02/2016: EF 55-60%; d.) TTE 01/16/2018: EF 50   CHF (congestive heart failure) 2016   a.) TTE 04/21/2014: EF 40%, glob HK, mild LVH, mild BAE, triv AR/PR, mild TR, mod MR; b.) TTE 01/28/2016: EF 45%, mild BAE, triv AR/PR, mild TR, mod MR; c.) TTE 12/02/2016: EF 55-60%, mild TR; d.) TTE 01/16/2018: EF 50, mild LVH, LAE, mild MR/TR, mod AR   CKD (chronic kidney disease), stage III    Coronary artery calcification seen on CT scan    Depression    Diffuse brain atrophy    Diverticulosis    DOE (dyspnea on exertion)    GERD (gastroesophageal reflux disease) 2012   HLD (hyperlipidemia)    Hypertension    Hypothyroidism    Lumbar herniated disc 2010   Malignant melanoma of  skin of lower limb, including hip 2008, 2012, 2017   right thigh/2017- stage 4   Neuropathy    OSA (obstructive sleep apnea)    a.) does not require nocturnal PAP therapy   Osteoporosis, postmenopausal    Overactive bladder    Palpitations    Pancreatitis    Panhypopituitarism    Personal history of chemotherapy 2008   melonoma   Personal history of malignant melanoma of skin 2012   Personal history of radiation therapy 2017   melonoma   Pneumonia    PONV (postoperative nausea and vomiting)    Rectal bleeding    Secondary hyperparathyroidism of renal origin    Supraglottitis    Tachycardia    Tendonitis    Tick bite of head 07/20/2015   Tremor    Weight loss, unintentional     PAST SURGICAL HISTORY:  Past  Surgical History:  Procedure Laterality Date   BACK SURGERY  2010   BREAST CYST EXCISION  1985   COLONOSCOPY  07/2012   [facility]   COLONOSCOPY WITH PROPOFOL N/A 11/21/2016   Procedure: COLONOSCOPY WITH PROPOFOL;  Surgeon: Scot Jun, MD;  Location: [facility] ENDOSCOPY;  Service: Endoscopy;  Laterality: N/A;   ESOPHAGOGASTRODUODENOSCOPY     ESOPHAGOGASTRODUODENOSCOPY (EGD) WITH PROPOFOL N/A 11/21/2016   Procedure: ESOPHAGOGASTRODUODENOSCOPY (EGD) WITH PROPOFOL;  Surgeon: Scot Jun, MD;  Location: [facility] ENDOSCOPY;  Service: Endoscopy;  Laterality: N/A;   FRACTURE SURGERY     rod removed from left femur 07/21/2014   INGUINAL LYMPH NODE BIOPSY Right 07/17/2015   Procedure: INGUINAL LYMPH NODE BIOPSY/EXCISION;  Surgeon: Earline Mayotte, MD;  Location: [facility] ORS;  Service: General;  Laterality: Right;   KNEE ARTHROSCOPY WITH SUBCHONDROPLASTY Right 01/06/2022   Procedure: RIGHT KNEE ARTHROSCOPY WITH DEBRIDEMENT AND ABRASION CHONDROPLASTY AND A BONE GRAFT OF A BONE CYST INVOLVING THE LATERAL TIBIAL PLATEAU OF RIGHT KNEE.;  Surgeon: Christena Flake, MD;  Location: [facility] ORS;  Service: Orthopedics;  Laterality: Right;   laminnotomy reexploration posterior lumbar   12/26/2008   with nerve decomp and discectomy   MELANOMA EXCISION  2008   right thigh   PORTACATH PLACEMENT Left 03/14/2016   Procedure: INSERTION PORT-A-CATH;  Surgeon: Earline Mayotte, MD;  Location: [facility] ORS;  Service: General;  Laterality: Left;   SKIN LESION EXCISION Right 12-19-10   posterior   SPINE SURGERY      HEMATOLOGY/ONCOLOGY HISTORY:  Oncology History Overview Note  1. 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 TX, Nx, M1 tumor, stage IV. 2. 4 weeks of high in the Intron therapy high dose in February of 2009. 3. Low-dose interferon started from March of 2009  AUG 2012- Right thigh mass [Bx] Recurrent melanoma in the right thigh/ pelvic LN [PETscan]; NEG for BRAF V600E; April 2017 PET scan showed recurrent  disease in the right inguinal region which was confirmed by biopsy. Zeboraf discontinued in November 2017 secondary to intolerable rash. Patient initiated combination immunotherapy with nivolumab and ipilumimab on January 14, 2016. Patient initiated maintenance nivolumab on April 14, 2016 and discontinued on November 07, 2016.  Nivolumab was discontinued secondary to persistent diarrhea.  s/p recmicade- Single agent oral Mekinist from December 07, 2016 through December 2018 [Dr.Finnegan]  # s/p remicaide  # OCT 2012- IPI x 2 cycles [stopped sec to intol- Side effects]  # April 2017- PET scan shows recurrent disease with- RIGHT INGUINAL adenopathy s/p Bx [Dr.Byrnett].June 2017 [OSH]- right external Iliac LN-  RT- IPI+NIVO .  # Vitamin B  12 deficiency      Malignant melanoma of skin of lower limb, including hip   Initial Diagnosis   Malignant melanoma of skin of lower limb, including hip (HCC)   Metastatic melanoma  06/01/2012 Initial Diagnosis   Metastatic melanoma (HCC)     ALLERGIES:  is allergic to clindamycin, levaquin [levofloxacin], aripiprazole, depakote [divalproex sodium], prednisone, valproic acid, ondansetron hcl, oxycodone-acetaminophen, and promethazine.  MEDICATIONS:  Current Outpatient Medications  Medication Sig Dispense Refill   acetaminophen (TYLENOL) 650 MG CR tablet Take 650 mg by mouth every 8 (eight) hours as needed for pain.     calcitRIOL (ROCALTROL) 0.25 MCG capsule Take 0.25 mcg by mouth daily.  6   calcium carbonate (OSCAL) 1500 (600 Ca) MG TABS tablet Take 600 mg of elemental calcium by mouth daily. Q hs , 2 chews     Cholecalciferol (VITAMIN D3) 250 MCG (10000 UT) capsule Vitamin D3     clonazePAM (KLONOPIN) 0.5 MG tablet Take 0.5 mg by mouth at bedtime.     ferrous sulfate 325 (65 FE) MG tablet Take by mouth.     hydrocortisone (CORTEF) 10 MG tablet Take 10-15 mg by mouth 2 (two) times daily. 1.5 tablets (15 MG) in the morning and 1 tablet (10 MG) in  the evening     levothyroxine (SYNTHROID) 150 MCG tablet Take 150 mcg by mouth daily before breakfast.     magnesium gluconate (MAGONATE) 500 MG tablet Take 500 mg by mouth daily.     Multiple Vitamin (MULTI-VITAMIN) tablet Take 1 tablet by mouth 1 day or 1 dose.     pantoprazole (PROTONIX) 40 MG tablet TAKE 1 TABLET BY MOUTH  TWICE DAILY 180 tablet 3   pregabalin (LYRICA) 50 MG capsule Take 50 mg by mouth 3 (three) times daily. 2 tab in am and 1 tab in the pm     rosuvastatin (CRESTOR) 10 MG tablet Take 10 mg by mouth at bedtime.     venlafaxine XR (EFFEXOR-XR) 75 MG 24 hr capsule Take 150 mg by mouth 2 (two) times daily. 150 mg in am and 75 mg in pm     HYDROcodone-acetaminophen (NORCO/VICODIN) 5-325 MG tablet Take 1-2 tablets by mouth every 6 (six) hours as needed for moderate pain or severe pain. (Patient not taking: Reported on 04/01/2022) 30 tablet 0   Metoclopramide HCl (REGLAN PO) Take by mouth. (Patient not taking: Reported on 04/01/2022)     QUEtiapine (SEROQUEL) 50 MG tablet Take 25 mg by mouth.     No current facility-administered medications for this visit.    VITAL SIGNS: BP (!) 138/58 (BP Location: Left Arm, Patient Position: Sitting)   Pulse 79   Temp 99.1 F (37.3 C) (Tympanic)   Wt 140 lb (63.5 kg)   LMP 10/06/1997 (Approximate) Comment: No period since age 24.  SpO2 100%   BMI 21.93 kg/m  Filed Weights   06/03/22 1427  Weight: 140 lb (63.5 kg)    Estimated body mass index is 21.93 kg/m as calculated from the following:   Height as of 01/06/22: 5\' 7"  (1.702 m).   Weight as of this encounter: 140 lb (63.5 kg).  PERFORMANCE STATUS (ECOG) : 1 - Symptomatic but completely ambulatory  Review of Systems As stated above in HPI  Physical Exam Vitals and nursing note reviewed.  Constitutional:      General: She is not in acute distress.    Appearance: Normal appearance. She is not ill-appearing, toxic-appearing or diaphoretic.  HENT:  Head: Normocephalic and  atraumatic.  Cardiovascular:     Rate and Rhythm: Normal rate and regular rhythm.     Heart sounds: Normal heart sounds.  Pulmonary:     Effort: Pulmonary effort is normal.     Breath sounds: Normal breath sounds.  Abdominal:     General: There is no distension.     Palpations: Abdomen is soft. There is no mass.  Musculoskeletal:     Cervical back: Normal range of motion and neck supple.  Lymphadenopathy:     Head:     Right side of head: No submental, submandibular, tonsillar, preauricular, posterior auricular or occipital adenopathy.     Left side of head: No submental, submandibular, tonsillar, preauricular, posterior auricular or occipital adenopathy.     Cervical: No cervical adenopathy.     Right cervical: No superficial, deep or posterior cervical adenopathy.    Left cervical: No superficial, deep or posterior cervical adenopathy.     Upper Body:     Right upper body: No supraclavicular, axillary, pectoral or epitrochlear adenopathy.     Left upper body: No supraclavicular, axillary, pectoral or epitrochlear adenopathy.     Lower Body: No right inguinal adenopathy. Inguinal adenopathy: There is a roughly 61mm x 16mm semi soft mobile lymph node that is palpable and nontender.  Skin:    General: Skin is warm.     Findings: No rash.  Neurological:     Mental Status: She is alert and oriented to person, place, and time.  Psychiatric:        Mood and Affect: Mood normal.        Behavior: Behavior normal.        Thought Content: Thought content normal.        Judgment: Judgment normal.    Assessment and Plan- Patient is a 62 y.o. female    Encounter Diagnoses  Name Primary?   Metastatic melanoma Yes   Lung nodule, multiple    This is a new concern.  On exam the lymph node does stand out compared to the rest in terms of size but is semi soft, non-tender and mobile. In a patient without metastatic melanoma I may elect a watch and wait approach x 2 weeks however Markella has had  multiple recurrences of her melanoma in the past- some involving the lymphatic system.  She is on surveillance.  I have reviewed her CT scan which was performed in January of this year and this lymph node was not mentioned as an area of potential concern. While discussing her last CT scan, patient and daughter express concern about the lung and ribcage findings of her last few CT scans(as shown above in note). Given this new prominent lymph node, her 4 pound unintentional weight loss, and lesions under close monitoring, I would recommend an updated PET scan with biopsy of the site should this be hypermetabolic on imaging. The benefit of the PET scan compared to normal CT scan for this patient in this scenario is that the areas under surveillance would be better assessed as well- this may prolong surveillance imaging. Patient expresses thankfulness as she was really hoping for a PET scan. She lost a family member last night and her sister is on hospice both with cancer- she is hoping this scan can also put her mind at ease as she focuses on her grief. Condolences offered to both her and her daughter.   Disposition: PET scan order placed- to be completed within 2 weeks RTC 1-3  days after PET scan to discuss results with Dr. Donneta Romberg.   Patient expressed understanding and was in agreement with this plan. She also understands that She can call clinic at any time with any questions, concerns, or complaints.   Thank you for allowing me to participate in the care of this very pleasant patient.   Time Total: 25  Visit consisted of counseling and education dealing with the complex and emotionally intense issues of symptom management in the setting of serious illness.Greater than 50%  of this time was spent counseling and coordinating care related to the above assessment and plan.  Signed by: Clent Jacks, PA-C

## 2022-06-10 ENCOUNTER — Telehealth: Payer: Self-pay | Admitting: Internal Medicine

## 2022-06-10 NOTE — Telephone Encounter (Signed)
Per secure chat- Pet scan not authorized: Called patient to cancel PET and f/u appointment with Dr. Leonard Schwartz. Message stated I will call her when PET is authorized and rescheduled and let her know the date and time and also reschedule Dr. B appointment.

## 2022-06-14 ENCOUNTER — Ambulatory Visit: Payer: Medicare HMO

## 2022-06-15 ENCOUNTER — Encounter: Payer: Self-pay | Admitting: Internal Medicine

## 2022-06-16 ENCOUNTER — Other Ambulatory Visit: Payer: Self-pay | Admitting: *Deleted

## 2022-06-16 ENCOUNTER — Inpatient Hospital Stay: Payer: Medicare HMO | Admitting: Internal Medicine

## 2022-06-16 DIAGNOSIS — C439 Malignant melanoma of skin, unspecified: Secondary | ICD-10-CM

## 2022-06-17 ENCOUNTER — Other Ambulatory Visit: Payer: Self-pay | Admitting: Internal Medicine

## 2022-06-17 DIAGNOSIS — Z1231 Encounter for screening mammogram for malignant neoplasm of breast: Secondary | ICD-10-CM

## 2022-06-20 ENCOUNTER — Ambulatory Visit
Admission: RE | Admit: 2022-06-20 | Discharge: 2022-06-20 | Disposition: A | Discharge status: Home / Self Care | Payer: Medicare HMO | Source: Ambulatory Visit | Attending: Medical Oncology | Admitting: Medical Oncology

## 2022-06-20 DIAGNOSIS — R634 Abnormal weight loss: Secondary | ICD-10-CM | POA: Diagnosis not present

## 2022-06-20 DIAGNOSIS — R591 Generalized enlarged lymph nodes: Secondary | ICD-10-CM | POA: Insufficient documentation

## 2022-06-20 DIAGNOSIS — C439 Malignant melanoma of skin, unspecified: Secondary | ICD-10-CM

## 2022-06-20 DIAGNOSIS — R918 Other nonspecific abnormal finding of lung field: Secondary | ICD-10-CM | POA: Diagnosis not present

## 2022-06-20 DIAGNOSIS — I700 Atherosclerosis of aorta: Secondary | ICD-10-CM | POA: Diagnosis not present

## 2022-06-20 LAB — GLUCOSE, CAPILLARY: Glucose-Capillary: 68 mg/dL — ABNORMAL LOW (ref 70–99)

## 2022-06-20 MED ORDER — FLUDEOXYGLUCOSE F - 18 (FDG) INJECTION
7.8600 | Freq: Once | INTRAVENOUS | Status: AC | PRN
  Administered 2022-06-20: 7.86 via INTRAVENOUS

## 2022-06-23 ENCOUNTER — Inpatient Hospital Stay: Payer: Medicare HMO

## 2022-06-23 ENCOUNTER — Inpatient Hospital Stay: Payer: Medicare HMO | Admitting: Internal Medicine

## 2022-06-23 ENCOUNTER — Encounter: Payer: Self-pay | Admitting: Internal Medicine

## 2022-06-23 VITALS — BP 123/51 | HR 71 | Temp 97.3°F | Resp 17 | Ht 67.0 in | Wt 143.0 lb

## 2022-06-23 DIAGNOSIS — C439 Malignant melanoma of skin, unspecified: Secondary | ICD-10-CM

## 2022-06-23 DIAGNOSIS — C4371 Malignant melanoma of right lower limb, including hip: Secondary | ICD-10-CM | POA: Diagnosis not present

## 2022-06-23 LAB — CMP (CANCER CENTER ONLY)
ALT: 16 U/L (ref 0–44)
AST: 32 U/L (ref 15–41)
Albumin: 4.1 g/dL (ref 3.5–5.0)
Alkaline Phosphatase: 57 U/L (ref 38–126)
Anion gap: 5 (ref 5–15)
BUN: 19 mg/dL (ref 6–20)
CO2: 29 mmol/L (ref 22–32)
Calcium: 8.9 mg/dL (ref 8.9–10.3)
Chloride: 101 mmol/L (ref 98–111)
Creatinine: 1.95 mg/dL — ABNORMAL HIGH (ref 0.44–1.00)
GFR, Estimated: 29 mL/min — ABNORMAL LOW (ref >60)
Glucose, Bld: 129 mg/dL — ABNORMAL HIGH (ref 70–99)
Potassium: 4.7 mmol/L (ref 3.5–5.1)
Sodium: 135 mmol/L (ref 135–145)
Total Bilirubin: 0.5 mg/dL (ref 0.3–1.2)
Total Protein: 6.7 g/dL (ref 6.5–8.1)

## 2022-06-23 LAB — IRON AND TIBC
Iron: 70 ug/dL (ref 28–170)
Saturation Ratios: 26 % (ref 10.4–31.8)
TIBC: 274 ug/dL (ref 250–450)
UIBC: 204 ug/dL

## 2022-06-23 LAB — CBC WITH DIFFERENTIAL (CANCER CENTER ONLY)
Abs Immature Granulocytes: 0.01 10*3/uL (ref 0.00–0.07)
Basophils Absolute: 0 10*3/uL (ref 0.0–0.1)
Basophils Relative: 1 %
Eosinophils Absolute: 0.2 10*3/uL (ref 0.0–0.5)
Eosinophils Relative: 5 %
HCT: 39 % (ref 36.0–46.0)
Hemoglobin: 12.8 g/dL (ref 12.0–15.0)
Immature Granulocytes: 0 %
Lymphocytes Relative: 40 %
Lymphs Abs: 1.7 10*3/uL (ref 0.7–4.0)
MCH: 30.3 pg (ref 26.0–34.0)
MCHC: 32.8 g/dL (ref 30.0–36.0)
MCV: 92.4 fL (ref 80.0–100.0)
Monocytes Absolute: 0.3 10*3/uL (ref 0.1–1.0)
Monocytes Relative: 7 %
Neutro Abs: 2 10*3/uL (ref 1.7–7.7)
Neutrophils Relative %: 47 %
Platelet Count: 198 10*3/uL (ref 150–400)
RBC: 4.22 MIL/uL (ref 3.87–5.11)
RDW: 12.9 % (ref 11.5–15.5)
WBC Count: 4.2 10*3/uL (ref 4.0–10.5)
nRBC: 0 % (ref 0.0–0.2)

## 2022-06-23 LAB — FERRITIN: Ferritin: 56 ng/mL (ref 11–307)
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
## 2022-06-23 NOTE — Progress Notes (Signed)
Pt has lost a few pounds. Appetite is good. Has neuropathy in feet. Memory is better. Smokes daily ~ 2-5 per day. Vaping.  On Right side joint pains. Spot on hip a little bump that is tender to touch.
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
## 2022-07-23 ENCOUNTER — Encounter: Payer: Self-pay | Admitting: Oncology

## 2022-08-15 ENCOUNTER — Encounter: Payer: Self-pay | Admitting: Oncology

## 2022-08-22 ENCOUNTER — Ambulatory Visit
Admission: RE | Admit: 2022-08-22 | Discharge: 2022-08-22 | Disposition: A | Discharge status: Home / Self Care | Payer: Medicare Other | Source: Ambulatory Visit | Attending: Family Medicine | Admitting: Family Medicine

## 2022-08-22 DIAGNOSIS — M85852 Other specified disorders of bone density and structure, left thigh: Secondary | ICD-10-CM | POA: Diagnosis not present

## 2022-08-22 DIAGNOSIS — E039 Hypothyroidism, unspecified: Secondary | ICD-10-CM | POA: Insufficient documentation

## 2022-08-22 DIAGNOSIS — K219 Gastro-esophageal reflux disease without esophagitis: Secondary | ICD-10-CM | POA: Diagnosis not present

## 2022-08-22 DIAGNOSIS — Z13820 Encounter for screening for osteoporosis: Secondary | ICD-10-CM | POA: Diagnosis present

## 2022-08-22 DIAGNOSIS — Z780 Asymptomatic menopausal state: Secondary | ICD-10-CM | POA: Diagnosis not present

## 2022-08-22 DIAGNOSIS — F17200 Nicotine dependence, unspecified, uncomplicated: Secondary | ICD-10-CM | POA: Diagnosis not present

## 2022-09-19 ENCOUNTER — Encounter: Payer: Self-pay | Admitting: Internal Medicine

## 2022-09-20 ENCOUNTER — Ambulatory Visit
Admission: RE | Admit: 2022-09-20 | Discharge: 2022-09-20 | Disposition: A | Discharge status: Home / Self Care | Payer: Medicare Other | Source: Ambulatory Visit | Attending: Internal Medicine | Admitting: Internal Medicine

## 2022-09-20 DIAGNOSIS — Z1231 Encounter for screening mammogram for malignant neoplasm of breast: Secondary | ICD-10-CM | POA: Insufficient documentation

## 2022-09-27 ENCOUNTER — Ambulatory Visit
Admission: RE | Admit: 2022-09-27 | Discharge: 2022-09-27 | Disposition: A | Discharge status: Home / Self Care | Payer: Medicare Other | Source: Ambulatory Visit | Attending: Internal Medicine | Admitting: Internal Medicine

## 2022-09-27 ENCOUNTER — Encounter: Payer: Self-pay | Admitting: Oncology

## 2022-09-27 DIAGNOSIS — R918 Other nonspecific abnormal finding of lung field: Secondary | ICD-10-CM | POA: Insufficient documentation

## 2022-09-27 DIAGNOSIS — C439 Malignant melanoma of skin, unspecified: Secondary | ICD-10-CM | POA: Diagnosis present

## 2022-10-03 ENCOUNTER — Inpatient Hospital Stay: Payer: Medicare Other | Admitting: Internal Medicine

## 2022-10-03 ENCOUNTER — Inpatient Hospital Stay: Payer: Medicare Other | Attending: Internal Medicine

## 2022-10-03 ENCOUNTER — Encounter: Payer: Self-pay | Admitting: Internal Medicine

## 2022-10-03 VITALS — BP 116/82 | HR 65 | Temp 97.0°F | Ht 67.0 in | Wt 127.0 lb

## 2022-10-03 DIAGNOSIS — R634 Abnormal weight loss: Secondary | ICD-10-CM | POA: Diagnosis not present

## 2022-10-03 DIAGNOSIS — I130 Hypertensive heart and chronic kidney disease with heart failure and stage 1 through stage 4 chronic kidney disease, or unspecified chronic kidney disease: Secondary | ICD-10-CM | POA: Diagnosis not present

## 2022-10-03 DIAGNOSIS — C439 Malignant melanoma of skin, unspecified: Secondary | ICD-10-CM

## 2022-10-03 DIAGNOSIS — G629 Polyneuropathy, unspecified: Secondary | ICD-10-CM | POA: Insufficient documentation

## 2022-10-03 DIAGNOSIS — Z85820 Personal history of malignant melanoma of skin: Secondary | ICD-10-CM | POA: Insufficient documentation

## 2022-10-03 DIAGNOSIS — R918 Other nonspecific abnormal finding of lung field: Secondary | ICD-10-CM | POA: Diagnosis not present

## 2022-10-03 DIAGNOSIS — M2550 Pain in unspecified joint: Secondary | ICD-10-CM | POA: Insufficient documentation

## 2022-10-03 DIAGNOSIS — K219 Gastro-esophageal reflux disease without esophagitis: Secondary | ICD-10-CM | POA: Diagnosis not present

## 2022-10-03 DIAGNOSIS — E785 Hyperlipidemia, unspecified: Secondary | ICD-10-CM | POA: Diagnosis not present

## 2022-10-03 DIAGNOSIS — F17210 Nicotine dependence, cigarettes, uncomplicated: Secondary | ICD-10-CM | POA: Insufficient documentation

## 2022-10-03 DIAGNOSIS — Z79899 Other long term (current) drug therapy: Secondary | ICD-10-CM | POA: Insufficient documentation

## 2022-10-03 DIAGNOSIS — I429 Cardiomyopathy, unspecified: Secondary | ICD-10-CM | POA: Insufficient documentation

## 2022-10-03 DIAGNOSIS — I700 Atherosclerosis of aorta: Secondary | ICD-10-CM | POA: Diagnosis not present

## 2022-10-03 DIAGNOSIS — N183 Chronic kidney disease, stage 3 unspecified: Secondary | ICD-10-CM | POA: Insufficient documentation

## 2022-10-03 DIAGNOSIS — E039 Hypothyroidism, unspecified: Secondary | ICD-10-CM | POA: Diagnosis not present

## 2022-10-03 DIAGNOSIS — G8929 Other chronic pain: Secondary | ICD-10-CM | POA: Diagnosis not present

## 2022-10-03 LAB — COMPREHENSIVE METABOLIC PANEL
ALT: 18 U/L (ref 0–44)
AST: 26 U/L (ref 15–41)
Albumin: 4.1 g/dL (ref 3.5–5.0)
Alkaline Phosphatase: 54 U/L (ref 38–126)
Anion gap: 6 (ref 5–15)
BUN: 22 mg/dL — ABNORMAL HIGH (ref 6–20)
CO2: 25 mmol/L (ref 22–32)
Calcium: 9.1 mg/dL (ref 8.9–10.3)
Chloride: 101 mmol/L (ref 98–111)
Creatinine, Ser: 1.71 mg/dL — ABNORMAL HIGH (ref 0.44–1.00)
GFR, Estimated: 34 mL/min — ABNORMAL LOW (ref >60)
Glucose, Bld: 72 mg/dL (ref 70–99)
Potassium: 3.7 mmol/L (ref 3.5–5.1)
Sodium: 132 mmol/L — ABNORMAL LOW (ref 135–145)
Total Bilirubin: 0.5 mg/dL (ref 0.3–1.2)
Total Protein: 6.6 g/dL (ref 6.5–8.1)

## 2022-10-03 LAB — CBC WITH DIFFERENTIAL/PLATELET
Abs Immature Granulocytes: 0.01 10*3/uL (ref 0.00–0.07)
Basophils Absolute: 0 10*3/uL (ref 0.0–0.1)
Basophils Relative: 1 %
Eosinophils Absolute: 0 10*3/uL (ref 0.0–0.5)
Eosinophils Relative: 1 %
HCT: 34.3 % — ABNORMAL LOW (ref 36.0–46.0)
Hemoglobin: 11.6 g/dL — ABNORMAL LOW (ref 12.0–15.0)
Immature Granulocytes: 0 %
Lymphocytes Relative: 27 %
Lymphs Abs: 1.6 10*3/uL (ref 0.7–4.0)
MCH: 30.7 pg (ref 26.0–34.0)
MCHC: 33.8 g/dL (ref 30.0–36.0)
MCV: 90.7 fL (ref 80.0–100.0)
Monocytes Absolute: 0.4 10*3/uL (ref 0.1–1.0)
Monocytes Relative: 6 %
Neutro Abs: 3.7 10*3/uL (ref 1.7–7.7)
Neutrophils Relative %: 65 %
Platelets: 218 10*3/uL (ref 150–400)
RBC: 3.78 MIL/uL — ABNORMAL LOW (ref 3.87–5.11)
RDW: 12.6 % (ref 11.5–15.5)
WBC: 5.7 10*3/uL (ref 4.0–10.5)
nRBC: 0 % (ref 0.0–0.2)

## 2022-10-03 LAB — IRON AND TIBC
Iron: 82 ug/dL (ref 28–170)
Saturation Ratios: 34 % — ABNORMAL HIGH (ref 10.4–31.8)
TIBC: 241 ug/dL — ABNORMAL LOW (ref 250–450)
UIBC: 159 ug/dL

## 2022-10-03 LAB — LACTATE DEHYDROGENASE: LDH: 114 U/L (ref 98–192)

## 2022-10-03 LAB — FERRITIN: Ferritin: 71 ng/mL (ref 11–307)

## 2022-10-03 NOTE — Progress Notes (Signed)
CT results
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
## 2022-12-23 ENCOUNTER — Ambulatory Visit: Payer: Medicare HMO | Admitting: Internal Medicine

## 2022-12-23 ENCOUNTER — Other Ambulatory Visit: Payer: Medicare HMO

## 2022-12-30 ENCOUNTER — Other Ambulatory Visit: Payer: Self-pay | Admitting: Neurology

## 2022-12-30 DIAGNOSIS — G43719 Chronic migraine without aura, intractable, without status migrainosus: Secondary | ICD-10-CM

## 2023-01-06 ENCOUNTER — Ambulatory Visit: Admission: RE | Admit: 2023-01-06 | Payer: Medicare Other | Source: Ambulatory Visit

## 2023-01-16 ENCOUNTER — Ambulatory Visit
Admission: RE | Admit: 2023-01-16 | Discharge: 2023-01-16 | Disposition: A | Discharge status: Home / Self Care | Payer: Medicare Other | Source: Ambulatory Visit | Attending: Neurology | Admitting: Neurology

## 2023-01-16 DIAGNOSIS — G43719 Chronic migraine without aura, intractable, without status migrainosus: Secondary | ICD-10-CM | POA: Diagnosis present

## 2023-02-01 ENCOUNTER — Encounter: Payer: Self-pay | Admitting: Internal Medicine

## 2023-02-28 ENCOUNTER — Encounter (INDEPENDENT_AMBULATORY_CARE_PROVIDER_SITE_OTHER): Payer: Self-pay

## 2023-03-08 ENCOUNTER — Other Ambulatory Visit: Payer: Self-pay | Admitting: Medical Genetics

## 2023-03-13 ENCOUNTER — Other Ambulatory Visit: Payer: Self-pay

## 2023-03-14 ENCOUNTER — Other Ambulatory Visit
Admission: RE | Admit: 2023-03-14 | Discharge: 2023-03-14 | Disposition: A | Discharge status: Home / Self Care | Payer: Self-pay | Source: Ambulatory Visit | Attending: Medical Genetics | Admitting: Medical Genetics

## 2023-03-24 LAB — GENECONNECT MOLECULAR SCREEN: Genetic Analysis Overall Interpretation: NEGATIVE

## 2023-04-05 ENCOUNTER — Inpatient Hospital Stay: Payer: Medicare Other | Admitting: Internal Medicine

## 2023-04-05 ENCOUNTER — Inpatient Hospital Stay: Payer: Medicare Other

## 2023-04-07 ENCOUNTER — Inpatient Hospital Stay: Payer: Medicare Other | Attending: Internal Medicine

## 2023-04-07 ENCOUNTER — Inpatient Hospital Stay: Payer: Medicare Other | Admitting: Internal Medicine

## 2023-04-07 ENCOUNTER — Encounter: Payer: Self-pay | Admitting: Internal Medicine

## 2023-04-07 VITALS — BP 125/75 | HR 76 | Temp 98.6°F | Resp 20 | Wt 135.2 lb

## 2023-04-07 DIAGNOSIS — E039 Hypothyroidism, unspecified: Secondary | ICD-10-CM | POA: Insufficient documentation

## 2023-04-07 DIAGNOSIS — J449 Chronic obstructive pulmonary disease, unspecified: Secondary | ICD-10-CM | POA: Insufficient documentation

## 2023-04-07 DIAGNOSIS — Z9221 Personal history of antineoplastic chemotherapy: Secondary | ICD-10-CM | POA: Diagnosis not present

## 2023-04-07 DIAGNOSIS — Z79899 Other long term (current) drug therapy: Secondary | ICD-10-CM | POA: Diagnosis not present

## 2023-04-07 DIAGNOSIS — M546 Pain in thoracic spine: Secondary | ICD-10-CM | POA: Insufficient documentation

## 2023-04-07 DIAGNOSIS — C439 Malignant melanoma of skin, unspecified: Secondary | ICD-10-CM

## 2023-04-07 DIAGNOSIS — M810 Age-related osteoporosis without current pathological fracture: Secondary | ICD-10-CM | POA: Insufficient documentation

## 2023-04-07 DIAGNOSIS — N2581 Secondary hyperparathyroidism of renal origin: Secondary | ICD-10-CM | POA: Diagnosis not present

## 2023-04-07 DIAGNOSIS — D631 Anemia in chronic kidney disease: Secondary | ICD-10-CM | POA: Insufficient documentation

## 2023-04-07 DIAGNOSIS — N183 Chronic kidney disease, stage 3 unspecified: Secondary | ICD-10-CM | POA: Diagnosis not present

## 2023-04-07 DIAGNOSIS — K219 Gastro-esophageal reflux disease without esophagitis: Secondary | ICD-10-CM | POA: Diagnosis not present

## 2023-04-07 DIAGNOSIS — E785 Hyperlipidemia, unspecified: Secondary | ICD-10-CM | POA: Insufficient documentation

## 2023-04-07 DIAGNOSIS — C792 Secondary malignant neoplasm of skin: Secondary | ICD-10-CM | POA: Diagnosis not present

## 2023-04-07 DIAGNOSIS — I130 Hypertensive heart and chronic kidney disease with heart failure and stage 1 through stage 4 chronic kidney disease, or unspecified chronic kidney disease: Secondary | ICD-10-CM | POA: Insufficient documentation

## 2023-04-07 DIAGNOSIS — M129 Arthropathy, unspecified: Secondary | ICD-10-CM | POA: Diagnosis not present

## 2023-04-07 DIAGNOSIS — F17210 Nicotine dependence, cigarettes, uncomplicated: Secondary | ICD-10-CM | POA: Insufficient documentation

## 2023-04-07 DIAGNOSIS — G4733 Obstructive sleep apnea (adult) (pediatric): Secondary | ICD-10-CM | POA: Diagnosis not present

## 2023-04-07 DIAGNOSIS — Z79890 Hormone replacement therapy (postmenopausal): Secondary | ICD-10-CM | POA: Insufficient documentation

## 2023-04-07 DIAGNOSIS — I700 Atherosclerosis of aorta: Secondary | ICD-10-CM | POA: Diagnosis not present

## 2023-04-07 DIAGNOSIS — Z800 Family history of malignant neoplasm of digestive organs: Secondary | ICD-10-CM | POA: Insufficient documentation

## 2023-04-07 DIAGNOSIS — I2510 Atherosclerotic heart disease of native coronary artery without angina pectoris: Secondary | ICD-10-CM | POA: Insufficient documentation

## 2023-04-07 DIAGNOSIS — E2740 Unspecified adrenocortical insufficiency: Secondary | ICD-10-CM | POA: Diagnosis not present

## 2023-04-07 LAB — CBC WITH DIFFERENTIAL (CANCER CENTER ONLY)
Abs Immature Granulocytes: 0.01 10*3/uL (ref 0.00–0.07)
Basophils Absolute: 0.1 10*3/uL (ref 0.0–0.1)
Basophils Relative: 1 %
Eosinophils Absolute: 0.2 10*3/uL (ref 0.0–0.5)
Eosinophils Relative: 4 %
HCT: 35.6 % — ABNORMAL LOW (ref 36.0–46.0)
Hemoglobin: 11.8 g/dL — ABNORMAL LOW (ref 12.0–15.0)
Immature Granulocytes: 0 %
Lymphocytes Relative: 38 %
Lymphs Abs: 1.6 10*3/uL (ref 0.7–4.0)
MCH: 30.6 pg (ref 26.0–34.0)
MCHC: 33.1 g/dL (ref 30.0–36.0)
MCV: 92.2 fL (ref 80.0–100.0)
Monocytes Absolute: 0.4 10*3/uL (ref 0.1–1.0)
Monocytes Relative: 8 %
Neutro Abs: 2 10*3/uL (ref 1.7–7.7)
Neutrophils Relative %: 49 %
Platelet Count: 224 10*3/uL (ref 150–400)
RBC: 3.86 MIL/uL — ABNORMAL LOW (ref 3.87–5.11)
RDW: 12.6 % (ref 11.5–15.5)
WBC Count: 4.2 10*3/uL (ref 4.0–10.5)
nRBC: 0 % (ref 0.0–0.2)

## 2023-04-07 LAB — CMP (CANCER CENTER ONLY)
ALT: 16 U/L (ref 0–44)
AST: 29 U/L (ref 15–41)
Albumin: 4.1 g/dL (ref 3.5–5.0)
Alkaline Phosphatase: 51 U/L (ref 38–126)
Anion gap: 7 (ref 5–15)
BUN: 22 mg/dL (ref 8–23)
CO2: 27 mmol/L (ref 22–32)
Calcium: 8.7 mg/dL — ABNORMAL LOW (ref 8.9–10.3)
Chloride: 97 mmol/L — ABNORMAL LOW (ref 98–111)
Creatinine: 1.73 mg/dL — ABNORMAL HIGH (ref 0.44–1.00)
GFR, Estimated: 33 mL/min — ABNORMAL LOW (ref >60)
Glucose, Bld: 85 mg/dL (ref 70–99)
Potassium: 3.3 mmol/L — ABNORMAL LOW (ref 3.5–5.1)
Sodium: 131 mmol/L — ABNORMAL LOW (ref 135–145)
Total Bilirubin: 0.5 mg/dL (ref 0.0–1.2)
Total Protein: 6.6 g/dL (ref 6.5–8.1)

## 2023-04-07 LAB — FERRITIN: Ferritin: 82 ng/mL (ref 11–307)

## 2023-04-07 LAB — IRON AND TIBC
Iron: 86 ug/dL (ref 28–170)
Saturation Ratios: 32 % — ABNORMAL HIGH (ref 10.4–31.8)
TIBC: 266 ug/dL (ref 250–450)
UIBC: 180 ug/dL

## 2023-04-07 LAB — LACTATE DEHYDROGENASE: LDH: 141 U/L (ref 98–192)
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
## 2023-04-07 NOTE — Progress Notes (Signed)
 Patient states she has had pain in the middle of her shoulder blades which radiated to her neck and down the back of her legs. She states the Pain took her breathe away. Patient states this pain started on Tuesday. But pain has gotten better over the days. She believe she might have pulled something but doesn't know what.
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
## 2023-04-08 ENCOUNTER — Encounter: Payer: Self-pay | Admitting: Internal Medicine

## 2023-04-08 LAB — THYROID PANEL WITH TSH
Free Thyroxine Index: 2.4 (ref 1.2–4.9)
T3 Uptake Ratio: 30 % (ref 24–39)
T4, Total: 7.9 ug/dL (ref 4.5–12.0)
TSH: 0.069 u[IU]/mL — ABNORMAL LOW (ref 0.450–4.500)

## 2023-04-09 ENCOUNTER — Encounter: Payer: Self-pay | Admitting: Internal Medicine

## 2023-09-15 ENCOUNTER — Other Ambulatory Visit: Payer: Self-pay | Admitting: Internal Medicine

## 2023-09-15 DIAGNOSIS — Z1231 Encounter for screening mammogram for malignant neoplasm of breast: Secondary | ICD-10-CM

## 2023-09-21 ENCOUNTER — Ambulatory Visit: Payer: Medicare Other

## 2023-10-06 ENCOUNTER — Ambulatory Visit: Payer: Medicare Other | Admitting: Internal Medicine

## 2023-10-06 ENCOUNTER — Other Ambulatory Visit: Payer: Medicare Other

## 2023-10-11 ENCOUNTER — Ambulatory Visit
Admission: RE | Admit: 2023-10-11 | Discharge: 2023-10-11 | Disposition: A | Discharge status: Home / Self Care | Source: Ambulatory Visit | Attending: Internal Medicine | Admitting: Internal Medicine

## 2023-10-11 ENCOUNTER — Inpatient Hospital Stay: Admitting: Internal Medicine

## 2023-10-11 ENCOUNTER — Inpatient Hospital Stay: Attending: Internal Medicine

## 2023-10-11 ENCOUNTER — Encounter: Payer: Self-pay | Admitting: Internal Medicine

## 2023-10-11 VITALS — BP 126/60 | HR 64 | Temp 96.8°F | Ht 67.0 in | Wt 124.6 lb

## 2023-10-11 DIAGNOSIS — G8929 Other chronic pain: Secondary | ICD-10-CM | POA: Insufficient documentation

## 2023-10-11 DIAGNOSIS — C439 Malignant melanoma of skin, unspecified: Secondary | ICD-10-CM

## 2023-10-11 DIAGNOSIS — Z1231 Encounter for screening mammogram for malignant neoplasm of breast: Secondary | ICD-10-CM | POA: Insufficient documentation

## 2023-10-11 DIAGNOSIS — Z85820 Personal history of malignant melanoma of skin: Secondary | ICD-10-CM | POA: Insufficient documentation

## 2023-10-11 DIAGNOSIS — R002 Palpitations: Secondary | ICD-10-CM | POA: Insufficient documentation

## 2023-10-11 DIAGNOSIS — N184 Chronic kidney disease, stage 4 (severe): Secondary | ICD-10-CM | POA: Insufficient documentation

## 2023-10-11 DIAGNOSIS — M549 Dorsalgia, unspecified: Secondary | ICD-10-CM | POA: Insufficient documentation

## 2023-10-11 DIAGNOSIS — D631 Anemia in chronic kidney disease: Secondary | ICD-10-CM | POA: Diagnosis not present

## 2023-10-11 DIAGNOSIS — R5383 Other fatigue: Secondary | ICD-10-CM | POA: Diagnosis not present

## 2023-10-11 DIAGNOSIS — Z08 Encounter for follow-up examination after completed treatment for malignant neoplasm: Secondary | ICD-10-CM | POA: Insufficient documentation

## 2023-10-11 LAB — CBC WITH DIFFERENTIAL (CANCER CENTER ONLY)
Abs Immature Granulocytes: 0.02 K/uL (ref 0.00–0.07)
Basophils Absolute: 0 K/uL (ref 0.0–0.1)
Basophils Relative: 1 %
Eosinophils Absolute: 0.1 K/uL (ref 0.0–0.5)
Eosinophils Relative: 2 %
HCT: 33.7 % — ABNORMAL LOW (ref 36.0–46.0)
Hemoglobin: 11.3 g/dL — ABNORMAL LOW (ref 12.0–15.0)
Immature Granulocytes: 0 %
Lymphocytes Relative: 30 %
Lymphs Abs: 1.5 K/uL (ref 0.7–4.0)
MCH: 30.6 pg (ref 26.0–34.0)
MCHC: 33.5 g/dL (ref 30.0–36.0)
MCV: 91.3 fL (ref 80.0–100.0)
Monocytes Absolute: 0.3 K/uL (ref 0.1–1.0)
Monocytes Relative: 6 %
Neutro Abs: 3 K/uL (ref 1.7–7.7)
Neutrophils Relative %: 61 %
Platelet Count: 221 K/uL (ref 150–400)
RBC: 3.69 MIL/uL — ABNORMAL LOW (ref 3.87–5.11)
RDW: 12.5 % (ref 11.5–15.5)
WBC Count: 4.9 K/uL (ref 4.0–10.5)
nRBC: 0 % (ref 0.0–0.2)

## 2023-10-11 LAB — CMP (CANCER CENTER ONLY)
ALT: 17 U/L (ref 0–44)
AST: 31 U/L (ref 15–41)
Albumin: 3.9 g/dL (ref 3.5–5.0)
Alkaline Phosphatase: 49 U/L (ref 38–126)
Anion gap: 6 (ref 5–15)
BUN: 22 mg/dL (ref 8–23)
CO2: 27 mmol/L (ref 22–32)
Calcium: 9.5 mg/dL (ref 8.9–10.3)
Chloride: 103 mmol/L (ref 98–111)
Creatinine: 1.79 mg/dL — ABNORMAL HIGH (ref 0.44–1.00)
GFR, Estimated: 32 mL/min — ABNORMAL LOW (ref >60)
Glucose, Bld: 74 mg/dL (ref 70–99)
Potassium: 3.9 mmol/L (ref 3.5–5.1)
Sodium: 136 mmol/L (ref 135–145)
Total Bilirubin: 0.6 mg/dL (ref 0.0–1.2)
Total Protein: 6.6 g/dL (ref 6.5–8.1)

## 2023-10-11 LAB — IRON AND TIBC
Iron: 58 ug/dL (ref 28–170)
Saturation Ratios: 24 % (ref 10.4–31.8)
TIBC: 241 ug/dL — ABNORMAL LOW (ref 250–450)
UIBC: 183 ug/dL

## 2023-10-11 LAB — FERRITIN: Ferritin: 74 ng/mL (ref 11–307)

## 2023-10-11 LAB — LACTATE DEHYDROGENASE: LDH: 130 U/L (ref 98–192)
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
## 2023-10-11 NOTE — Progress Notes (Signed)
 Fatigued. Heart rate jumps around from AFib, causes her to have some dyspnea.Appetite is low, Fruit tastes good, living on fruit mostly. Arthritis causes achy pain and neuropathy causes pain in her hands.

## 2023-10-12 LAB — THYROID PANEL WITH TSH
Free Thyroxine Index: 2.2 (ref 1.2–4.9)
T3 Uptake Ratio: 27 % (ref 24–39)
T4, Total: 8.1 ug/dL (ref 4.5–12.0)
TSH: 0.072 u[IU]/mL — ABNORMAL LOW (ref 0.450–4.500)

## 2023-10-23 ENCOUNTER — Encounter: Payer: Self-pay | Admitting: Internal Medicine

## 2023-11-01 ENCOUNTER — Ambulatory Visit
Admission: RE | Admit: 2023-11-01 | Discharge: 2023-11-01 | Disposition: A | Discharge status: Home / Self Care | Source: Ambulatory Visit | Attending: Internal Medicine

## 2023-11-01 ENCOUNTER — Encounter: Payer: Self-pay | Admitting: Oncology

## 2023-11-01 DIAGNOSIS — C439 Malignant melanoma of skin, unspecified: Secondary | ICD-10-CM

## 2023-11-17 ENCOUNTER — Encounter: Payer: Self-pay | Admitting: Internal Medicine

## 2023-11-22 ENCOUNTER — Ambulatory Visit: Payer: Self-pay | Admitting: Internal Medicine

## 2024-04-12 ENCOUNTER — Other Ambulatory Visit

## 2024-04-12 ENCOUNTER — Ambulatory Visit: Admitting: Internal Medicine
